# Patient Record
Sex: Female | Born: 1945 | Race: Black or African American | Hispanic: No | Smoking: Former smoker
Health system: Southern US, Community
[De-identification: ages and names within clinical notes are randomized; demographics above are authoritative.]

## PROBLEM LIST (undated history)

## (undated) DIAGNOSIS — B353 Tinea pedis: Secondary | ICD-10-CM

## (undated) DIAGNOSIS — M1990 Unspecified osteoarthritis, unspecified site: Secondary | ICD-10-CM

## (undated) DIAGNOSIS — M109 Gout, unspecified: Secondary | ICD-10-CM

## (undated) DIAGNOSIS — R011 Cardiac murmur, unspecified: Secondary | ICD-10-CM

## (undated) DIAGNOSIS — I82409 Acute embolism and thrombosis of unspecified deep veins of unspecified lower extremity: Secondary | ICD-10-CM

## (undated) DIAGNOSIS — M5412 Radiculopathy, cervical region: Secondary | ICD-10-CM

## (undated) DIAGNOSIS — Z9289 Personal history of other medical treatment: Secondary | ICD-10-CM

## (undated) DIAGNOSIS — IMO0002 Reserved for concepts with insufficient information to code with codable children: Secondary | ICD-10-CM

## (undated) DIAGNOSIS — B009 Herpesviral infection, unspecified: Secondary | ICD-10-CM

## (undated) DIAGNOSIS — H269 Unspecified cataract: Secondary | ICD-10-CM

## (undated) DIAGNOSIS — M7550 Bursitis of unspecified shoulder: Secondary | ICD-10-CM

## (undated) DIAGNOSIS — E785 Hyperlipidemia, unspecified: Secondary | ICD-10-CM

## (undated) DIAGNOSIS — B351 Tinea unguium: Secondary | ICD-10-CM

## (undated) DIAGNOSIS — D689 Coagulation defect, unspecified: Secondary | ICD-10-CM

## (undated) DIAGNOSIS — M5416 Radiculopathy, lumbar region: Secondary | ICD-10-CM

## (undated) DIAGNOSIS — I2510 Atherosclerotic heart disease of native coronary artery without angina pectoris: Secondary | ICD-10-CM

## (undated) DIAGNOSIS — B019 Varicella without complication: Secondary | ICD-10-CM

## (undated) DIAGNOSIS — R0989 Other specified symptoms and signs involving the circulatory and respiratory systems: Secondary | ICD-10-CM

## (undated) DIAGNOSIS — U071 COVID-19: Secondary | ICD-10-CM

## (undated) DIAGNOSIS — M21371 Foot drop, right foot: Secondary | ICD-10-CM

## (undated) DIAGNOSIS — I10 Essential (primary) hypertension: Secondary | ICD-10-CM

## (undated) DIAGNOSIS — L304 Erythema intertrigo: Secondary | ICD-10-CM

## (undated) DIAGNOSIS — N183 Chronic kidney disease, stage 3 unspecified: Secondary | ICD-10-CM

## (undated) DIAGNOSIS — K219 Gastro-esophageal reflux disease without esophagitis: Secondary | ICD-10-CM

## (undated) DIAGNOSIS — E559 Vitamin D deficiency, unspecified: Secondary | ICD-10-CM

## (undated) DIAGNOSIS — I739 Peripheral vascular disease, unspecified: Secondary | ICD-10-CM

## (undated) DIAGNOSIS — M2041 Other hammer toe(s) (acquired), right foot: Secondary | ICD-10-CM

## (undated) DIAGNOSIS — E119 Type 2 diabetes mellitus without complications: Secondary | ICD-10-CM

## (undated) DIAGNOSIS — G4700 Insomnia, unspecified: Secondary | ICD-10-CM

## (undated) DIAGNOSIS — K635 Polyp of colon: Secondary | ICD-10-CM

## (undated) DIAGNOSIS — G4733 Obstructive sleep apnea (adult) (pediatric): Secondary | ICD-10-CM

## (undated) DIAGNOSIS — Z9989 Dependence on other enabling machines and devices: Secondary | ICD-10-CM

## (undated) DIAGNOSIS — M2042 Other hammer toe(s) (acquired), left foot: Secondary | ICD-10-CM

## (undated) DIAGNOSIS — G5600 Carpal tunnel syndrome, unspecified upper limb: Secondary | ICD-10-CM

## (undated) HISTORY — DX: Atherosclerotic heart disease of native coronary artery without angina pectoris: I25.10

## (undated) HISTORY — DX: Varicella without complication: B01.9

## (undated) HISTORY — DX: Other specified symptoms and signs involving the circulatory and respiratory systems: R09.89

## (undated) HISTORY — DX: COVID-19: U07.1

## (undated) HISTORY — PX: COLON SURGERY: SHX602

## (undated) HISTORY — DX: Coagulation defect, unspecified: D68.9

## (undated) HISTORY — PX: VASCULAR SURGERY: SHX849

## (undated) HISTORY — PX: BACK SURGERY: SHX140

## (undated) HISTORY — PX: OTHER SURGICAL HISTORY: SHX169

## (undated) HISTORY — DX: Essential (primary) hypertension: I10

## (undated) HISTORY — DX: Unspecified osteoarthritis, unspecified site: M19.90

## (undated) HISTORY — DX: Chronic kidney disease, stage 3 unspecified: N18.30

## (undated) HISTORY — DX: Type 2 diabetes mellitus without complications: E11.9

## (undated) HISTORY — DX: Polyp of colon: K63.5

## (undated) HISTORY — DX: Tinea pedis: B35.3

## (undated) HISTORY — DX: Vitamin D deficiency, unspecified: E55.9

## (undated) HISTORY — DX: Insomnia, unspecified: G47.00

## (undated) HISTORY — DX: Unspecified cataract: H26.9

## (undated) HISTORY — DX: Other hammer toe(s) (acquired), right foot: M20.41

## (undated) HISTORY — DX: Acute embolism and thrombosis of unspecified deep veins of unspecified lower extremity: I82.409

## (undated) HISTORY — DX: Personal history of other medical treatment: Z92.89

## (undated) HISTORY — PX: UPPER GI ENDOSCOPY: SHX6162

## (undated) HISTORY — DX: Obstructive sleep apnea (adult) (pediatric): G47.33

## (undated) HISTORY — PX: COLONOSCOPY: SHX174

## (undated) HISTORY — DX: Hyperlipidemia, unspecified: E78.5

## (undated) HISTORY — DX: Peripheral vascular disease, unspecified: I73.9

## (undated) HISTORY — DX: Gastro-esophageal reflux disease without esophagitis: K21.9

## (undated) HISTORY — DX: Herpesviral infection, unspecified: B00.9

## (undated) HISTORY — DX: Cardiac murmur, unspecified: R01.1

## (undated) HISTORY — DX: Carpal tunnel syndrome, unspecified upper limb: G56.00

## (undated) HISTORY — DX: Erythema intertrigo: L30.4

## (undated) HISTORY — DX: Other hammer toe(s) (acquired), left foot: M20.42

## (undated) HISTORY — DX: Reserved for concepts with insufficient information to code with codable children: IMO0002

## (undated) HISTORY — DX: Radiculopathy, lumbar region: M54.16

## (undated) HISTORY — PX: TUBAL LIGATION: SHX77

## (undated) HISTORY — DX: Foot drop, right foot: M21.371

## (undated) HISTORY — DX: Bursitis of unspecified shoulder: M75.50

## (undated) HISTORY — DX: Radiculopathy, cervical region: M54.12

## (undated) HISTORY — DX: Obstructive sleep apnea (adult) (pediatric): Z99.89

## (undated) HISTORY — DX: Gout, unspecified: M10.9

## (undated) HISTORY — DX: Tinea unguium: B35.1

---

## 2005-04-05 ENCOUNTER — Ambulatory Visit: Payer: Self-pay | Admitting: Family Medicine

## 2007-07-05 ENCOUNTER — Ambulatory Visit: Payer: Self-pay | Admitting: Family Medicine

## 2008-11-20 ENCOUNTER — Ambulatory Visit: Payer: Self-pay | Admitting: Family Medicine

## 2008-12-04 ENCOUNTER — Ambulatory Visit: Payer: Self-pay | Admitting: Family Medicine

## 2009-07-14 ENCOUNTER — Ambulatory Visit: Payer: Self-pay | Admitting: Family Medicine

## 2009-10-10 ENCOUNTER — Emergency Department: Payer: Self-pay | Admitting: Emergency Medicine

## 2012-01-17 DIAGNOSIS — M48062 Spinal stenosis, lumbar region with neurogenic claudication: Secondary | ICD-10-CM | POA: Insufficient documentation

## 2012-01-17 HISTORY — DX: Spinal stenosis, lumbar region with neurogenic claudication: M48.062

## 2012-10-19 DIAGNOSIS — M5416 Radiculopathy, lumbar region: Secondary | ICD-10-CM

## 2012-10-19 HISTORY — DX: Radiculopathy, lumbar region: M54.16

## 2012-12-24 ENCOUNTER — Ambulatory Visit: Payer: Self-pay | Admitting: Family Medicine

## 2013-04-16 DIAGNOSIS — I10 Essential (primary) hypertension: Secondary | ICD-10-CM

## 2013-04-16 DIAGNOSIS — E1151 Type 2 diabetes mellitus with diabetic peripheral angiopathy without gangrene: Secondary | ICD-10-CM | POA: Insufficient documentation

## 2013-04-16 DIAGNOSIS — Z0000 Encounter for general adult medical examination without abnormal findings: Secondary | ICD-10-CM | POA: Insufficient documentation

## 2013-04-16 DIAGNOSIS — E119 Type 2 diabetes mellitus without complications: Secondary | ICD-10-CM

## 2013-04-16 HISTORY — DX: Type 2 diabetes mellitus without complications: E11.9

## 2013-04-16 HISTORY — DX: Encounter for general adult medical examination without abnormal findings: Z00.00

## 2013-04-16 HISTORY — DX: Essential (primary) hypertension: I10

## 2013-05-10 ENCOUNTER — Emergency Department: Payer: Self-pay | Admitting: Emergency Medicine

## 2013-05-10 LAB — COMPREHENSIVE METABOLIC PANEL
Anion Gap: 4 — ABNORMAL LOW (ref 7–16)
BUN: 16 mg/dL (ref 7–18)
Bilirubin,Total: 0.5 mg/dL (ref 0.2–1.0)
Calcium, Total: 9.7 mg/dL (ref 8.5–10.1)
Creatinine: 1.06 mg/dL (ref 0.60–1.30)
Glucose: 139 mg/dL — ABNORMAL HIGH (ref 65–99)
Potassium: 4 mmol/L (ref 3.5–5.1)

## 2013-05-10 LAB — CBC
HCT: 39.5 % (ref 35.0–47.0)
HGB: 13.3 g/dL (ref 12.0–16.0)
MCH: 29.8 pg (ref 26.0–34.0)
RBC: 4.46 10*6/uL (ref 3.80–5.20)
RDW: 14.2 % (ref 11.5–14.5)
WBC: 10.1 10*3/uL (ref 3.6–11.0)

## 2013-05-10 LAB — URINALYSIS, COMPLETE
Bacteria: NONE SEEN
Bilirubin,UR: NEGATIVE
Glucose,UR: NEGATIVE mg/dL (ref 0–75)
Ketone: NEGATIVE
Leukocyte Esterase: NEGATIVE
Nitrite: NEGATIVE
Ph: 8 (ref 4.5–8.0)
Protein: NEGATIVE
Specific Gravity: 1.011 (ref 1.003–1.030)

## 2013-05-10 LAB — APTT: Activated PTT: 29.5 secs (ref 23.6–35.9)

## 2013-05-10 LAB — PROTIME-INR
INR: 1.1
Prothrombin Time: 14.2 secs (ref 11.5–14.7)

## 2013-05-20 ENCOUNTER — Encounter: Payer: Self-pay | Admitting: Podiatry

## 2013-05-20 ENCOUNTER — Ambulatory Visit (INDEPENDENT_AMBULATORY_CARE_PROVIDER_SITE_OTHER): Payer: Medicare (Managed Care) | Admitting: Podiatry

## 2013-05-20 VITALS — BP 111/66 | HR 91 | Resp 16 | Ht 61.0 in | Wt 154.0 lb

## 2013-05-20 DIAGNOSIS — B351 Tinea unguium: Secondary | ICD-10-CM

## 2013-05-20 DIAGNOSIS — M79609 Pain in unspecified limb: Secondary | ICD-10-CM

## 2013-05-20 NOTE — Progress Notes (Signed)
Natalie Watts presents today with a chief complaint of painful toenails one through 5 bilateral.  Objective: Pulses are palpable today. Toenails are thick yellow dystrophic clinically mycotic and painful palpation as well as debridement.  Assessment: Pain in limb secondary to onychomycosis 1 through 5 bilateral.  Plan: Debridement of nails 1 through 5 bilateral covered service secondary to pain followup with her in 3 months to

## 2013-07-18 ENCOUNTER — Ambulatory Visit: Payer: Self-pay | Admitting: Oncology

## 2013-07-25 ENCOUNTER — Ambulatory Visit: Payer: Self-pay | Admitting: Oncology

## 2013-08-18 ENCOUNTER — Ambulatory Visit: Payer: Self-pay | Admitting: Oncology

## 2013-08-19 ENCOUNTER — Ambulatory Visit: Payer: Medicare (Managed Care) | Admitting: Podiatry

## 2013-09-30 ENCOUNTER — Ambulatory Visit: Payer: Medicare (Managed Care) | Admitting: Podiatry

## 2013-12-30 ENCOUNTER — Ambulatory Visit (INDEPENDENT_AMBULATORY_CARE_PROVIDER_SITE_OTHER): Payer: 59 | Admitting: Podiatry

## 2013-12-30 ENCOUNTER — Encounter: Payer: Self-pay | Admitting: Podiatry

## 2013-12-30 DIAGNOSIS — B351 Tinea unguium: Secondary | ICD-10-CM

## 2013-12-30 DIAGNOSIS — M79609 Pain in unspecified limb: Secondary | ICD-10-CM

## 2013-12-30 DIAGNOSIS — M79676 Pain in unspecified toe(s): Secondary | ICD-10-CM

## 2013-12-30 NOTE — Progress Notes (Signed)
She presents today with a chief complaint of painful elongated toenails.  Objective: Thick yellow dystrophic clinically mycotic and painful palpation.  Assessment: Pain in limb secondary to onychomycosis 1 through 5 bilateral.  Plan: Debridement of nails thickness and length as cover service secondary to pain.

## 2014-04-03 ENCOUNTER — Ambulatory Visit: Payer: Self-pay | Admitting: Family Medicine

## 2014-04-07 ENCOUNTER — Ambulatory Visit: Payer: 59 | Admitting: Podiatry

## 2014-04-08 ENCOUNTER — Ambulatory Visit (INDEPENDENT_AMBULATORY_CARE_PROVIDER_SITE_OTHER): Payer: 59 | Admitting: Podiatry

## 2014-04-08 DIAGNOSIS — M79676 Pain in unspecified toe(s): Secondary | ICD-10-CM

## 2014-04-08 DIAGNOSIS — B351 Tinea unguium: Secondary | ICD-10-CM

## 2014-04-08 NOTE — Progress Notes (Signed)
Subjective:     Patient ID: Natalie Watts, female   DOB: [birth year], 68 y.o.   MRN: [id number]  HPI patient presents with painful nailbeds 1-5 on both feet that she cannot cut   Review of Systems     Objective:   Physical Exam Neurovascular status intact with thick yellow brittle nailbeds 1-5 both feet that are painful    Assessment:     Mycotic nail infection with pain 1-5 both feet    Plan:     Debridement painful nailbeds 1-5 both feet with no iatrogenic bleeding noted

## 2014-05-22 ENCOUNTER — Ambulatory Visit: Payer: Self-pay | Admitting: Family Medicine

## 2014-06-05 DIAGNOSIS — M4722 Other spondylosis with radiculopathy, cervical region: Secondary | ICD-10-CM | POA: Insufficient documentation

## 2014-06-05 DIAGNOSIS — M1812 Unilateral primary osteoarthritis of first carpometacarpal joint, left hand: Secondary | ICD-10-CM

## 2014-06-05 DIAGNOSIS — M47812 Spondylosis without myelopathy or radiculopathy, cervical region: Secondary | ICD-10-CM

## 2014-06-05 HISTORY — DX: Spondylosis without myelopathy or radiculopathy, cervical region: M47.812

## 2014-06-05 HISTORY — DX: Unilateral primary osteoarthritis of first carpometacarpal joint, left hand: M18.12

## 2014-07-10 ENCOUNTER — Ambulatory Visit: Payer: 59 | Admitting: Podiatry

## 2014-07-10 ENCOUNTER — Ambulatory Visit (INDEPENDENT_AMBULATORY_CARE_PROVIDER_SITE_OTHER): Payer: Medicare Other | Admitting: Podiatry

## 2014-07-10 DIAGNOSIS — M79676 Pain in unspecified toe(s): Secondary | ICD-10-CM

## 2014-07-10 DIAGNOSIS — B351 Tinea unguium: Secondary | ICD-10-CM

## 2014-07-13 NOTE — Progress Notes (Signed)
Patient ID: Natalie Watts, female   DOB: [birth year], 69 y.o.   MRN: [id number]  Subjective: 69 year old female with turns to the office today with painful, elongated toenails on both of her feet. She states that she is unable to cut them herself and they have been rubbing in her shoes causing discomfort. She denies any recent redness or drainage from around the nail sites. She states that her blood sugars been running between 114-130. No other complaints at this time. Denies any systemic complaints as fevers, chills, nausea, vomiting.  Objective: AAO 3, NAD DP/PT pulses palpable bilaterally, CRT less than 3 seconds Protective sensation appears to be intact with Derrel Nip monofilament, Achilles tendon reflex intact. Nails hypertrophic, dystrophic, elongated, brittle, discolored 10. No surrounding erythema or drainage from the nail sites. No open lesions or pre-ulcerative lesions. No interdigital maceration is present. No other areas of tenderness to bilateral lower extremities. No overlying edema, erythema, increase in warmth bilaterally. No pain with calf compression, swelling, warmth, erythema.  Assessment: 69 year old female with symptomatic onychomycosis  Plan: -Treatment options discussed including alternatives, risks, complications. -Nail sharply debrided 10 without complication/bleeding. -Discussed the importance of daily foot inspection. -Follow-up in 3 months or sooner should any problems arise. In the meantime, encouraged to call the office with any questions, concerns, change in symptoms.

## 2014-08-31 ENCOUNTER — Observation Stay: Payer: Self-pay | Admitting: Internal Medicine

## 2014-09-12 ENCOUNTER — Emergency Department: Payer: Self-pay | Admitting: Emergency Medicine

## 2014-09-12 LAB — LIPASE, BLOOD: LIPASE: 29 U/L

## 2014-09-12 LAB — URINALYSIS, COMPLETE
Bacteria: NONE SEEN
Bilirubin,UR: NEGATIVE
Leukocyte Esterase: NEGATIVE
NITRITE: NEGATIVE
Ph: 7 (ref 4.5–8.0)
Protein: 100
RBC,UR: 1 /HPF (ref 0–5)
SPECIFIC GRAVITY: 1.008 (ref 1.003–1.030)

## 2014-09-12 LAB — CBC
HCT: 44.3 % (ref 35.0–47.0)
HGB: 14.3 g/dL (ref 12.0–16.0)
MCH: 27.9 pg (ref 26.0–34.0)
MCHC: 32.3 g/dL (ref 32.0–36.0)
MCV: 86 fL (ref 80–100)
Platelet: 187 10*3/uL (ref 150–440)
RBC: 5.13 10*6/uL (ref 3.80–5.20)
RDW: 15.7 % — AB (ref 11.5–14.5)
WBC: 14.4 10*3/uL — AB (ref 3.6–11.0)

## 2014-09-12 LAB — COMPREHENSIVE METABOLIC PANEL
ALBUMIN: 4.5 g/dL
ALT: 19 U/L
AST: 21 U/L
Alkaline Phosphatase: 106 U/L
Anion Gap: 11 (ref 7–16)
BUN: 21 mg/dL — ABNORMAL HIGH
Bilirubin,Total: 0.5 mg/dL
Calcium, Total: 10.1 mg/dL
Chloride: 100 mmol/L — ABNORMAL LOW
Co2: 27 mmol/L
Creatinine: 1.12 mg/dL — ABNORMAL HIGH
EGFR (African American): 58 — ABNORMAL LOW
EGFR (Non-African Amer.): 50 — ABNORMAL LOW
Glucose: 262 mg/dL — ABNORMAL HIGH
POTASSIUM: 4.7 mmol/L
Sodium: 138 mmol/L
Total Protein: 9 g/dL — ABNORMAL HIGH

## 2014-10-17 ENCOUNTER — Ambulatory Visit: Admit: 2014-10-17 | Disposition: A | Discharge status: Home / Self Care | Payer: Self-pay

## 2014-10-19 NOTE — Consult Note (Signed)
PATIENT NAME:  Natalie Watts, Natalie Watts MR#:  [id number] DATE OF BIRTH:  [birth year]  DATE OF CONSULTATION:  08/31/2014  REFERRING PHYSICIAN:   CONSULTING PHYSICIAN:  Huey Romans, MD   REASON FOR CONSULTATION: Evaluate the larynx because of acute angioedema.   HISTORY OF PRESENT ILLNESS: The patient is a 69 year old African American female who awoke this morning with her tongue swelling. She came into the Emergency Room and has had continued swelling underneath the tongue where it is difficult to talk, and she is unable to swallow. She is having a hard time speaking as well. She started taking some lisinopril 2 days ago and had a dose in the evening last night and then woke this morning with swelling. She had some lip swelling with a blood pressure medication in the past but does not remember the name of it. Assessments were made today to see if there is any evidence of laryngeal swelling and if we need to proceed to intubation.   PAST MEDICAL HISTORY: Significant for hypertension.  REVIEW OF SYSTEMS: She is not having any shortness of breath at this time. She denies any lung issues. She has degenerative disk disease in her neck. She is not having any headache,  fever or signs of infection.   CURRENT MEDICATIONS: As noted in the chart.   DRUG ALLERGIES: TRAMADOL AND NOW LISINOPRIL.   SOCIAL HISTORY: She does not smoke or drink alcohol.   PHYSICAL EXAMINATION:  GENERAL: The patient is alert and very cooperative. Her tongue tip is sticking out of the mouth a little bit. She has watery edema of the floor of the mouth on both sides. Her face is not swollen although it almost looks like there is a little swelling in the left upper eyelid.  NECK: Soft. There is no swelling felt in the submental area or lower in the neck. Her speech is affected a little bit from the large tongue, but she is breathing okay on room air.   Flexible laryngoscopy was done, and this is dictated in detail elsewhere. I passed  through her right nostril which is clear. The nasopharynx is clear. The hypopharynx and larynx show no sign of swelling or edema around the laryngeal inlet at all. The tongue base does not appear to be swollen either.   IMPRESSION: The patient has angioedema. It has been caught fairly early. We will watch her for about 45 minutes. She has been given medication of Solu-Medrol, Benadryl IV, Pepcid and Epi-Pen. She does not appear to be having any more swelling right now and has stabilized. She does not have any laryngeal involvement. I will look one more time to make sure that her larynx has not started to swell. She will be admitted by the hospitalist service to evaluate and watch, but I do not think she needs intubation currently as there is no active swelling in the larynx. It seems like the swelling has stopped. I will be available if there are any further signs of swelling to re-evaluate if necessary.    ____________________________ Huey Romans, MD phj:JT D: 08/31/2014 08:27:07 ET T: 08/31/2014 14:03:35 ET JOB#: [id number]  cc: Huey Romans, MD, <Dictator> Huey Romans MD ELECTRONICALLY SIGNED 08/31/2014 22:01

## 2014-10-19 NOTE — Discharge Summary (Signed)
PATIENT NAME:  Natalie Watts, Natalie Watts MR#:  [id number] DATE OF BIRTH:  1946/04/15  DATE OF ADMISSION:  08/31/2014 DATE OF DISCHARGE:  09/01/2014  ADMITTING PHYSICIAN:  Gladstone Lighter, MD.    PRIMARY CARE PHYSICIAN:  [facility] at [facility].    CONSULTATIONS IN THE HOSPITAL: ENT consultation by Dr. Kathyrn Sheriff.     DISCHARGE DIAGNOSES:  1. 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  2. Hypertension.  3. Diabetes mellitus.  4. Neuropathy.  5. Left wrist tendinitis.  6. Hyperlipidemia.  7. Osteoarthritis.   8. Degenerative disk disease.  9. Peripheral vascular disease pronounced on her left side.  10. History of deep vein thrombosis in the past, off of Xarelto now.    DISCHARGE HOME MEDICATIONS:  1. Magnesium oxide 400 mg p.o. daily.  2. Norvasc 5 mg p.o. daily.  3. Glimepiride 2 mg p.o. daily.  4. Pravachol 40 mg p.o. at bedtime.  5. Gabapentin 100 mg p.o. at bedtime.  6. Calcium/vitamin D 600 mg/800 international unit daily.  7. Vitamin B12, 500 mcg p.o. every other day.  8. Prednisone taper over 5 days.  9. EpiPen as needed for allergies.   DISCHARGE OXYGEN: None.   DISCHARGE DIET: Low-sodium, ADA, 1800 calorie diet.   DISCHARGE ACTIVITY: As tolerated.    FOLLOWUP INSTRUCTIONS:  1.  PCP followup in 1 week.  2.  Avoid lisinopril and other ACE inhibitors.    LABORATORIES AND IMAGING STUDIES PRIOR TO DISCHARGE:  1.  WBC 9.3, hemoglobin 11.6, hematocrit 36.5, platelet count 198.000.  2.  Sodium 140, potassium 3.9, chloride 106, bicarbonate 23, BUN 19, creatinine 0.87, glucose 182, and calcium of 9.2.   BRIEF HOSPITAL COURSE: [name] is a 69 year old African-American female with past medical history significant for hypertension, diabetes, arthritis, neuropathy, and tendinitis, who presents to the hospital secondary to worsening tongue swelling.   1.  Angioedema. Her PCP just started her on lisinopril 2 days prior to this presentation and the patient had taken 1 dose. She  wakes up with significant tongue swelling, unable to breathe, and also lip swelling. Seen by ENT, flexible laryngoscopy done in the ER did not have any airway obstruction, admitted under observation.  IV steroids were given and she is on Benadryl and Pepcid with significant improvement in swelling within 24 hours. The patient to was able to tolerate diet.  ENT further followup did not recommend any outpatient allergy studies because it was straightforward and from lisinopril. All her other medications are being continued and she was advised to stay away from lisinopril and other ACE inhibitors. She is being discharged on prednisone taper and EpiPen.  2.  Hypertension. She is on Norvasc.  3.  Diabetes, on glipizide.  4.  Neuropathy and arthritis. Follows with rheumatologist, on gabapentin.   Her course has been otherwise uneventful in the hospital.   DISCHARGE CONDITION: Stable.   DISCHARGE DISPOSITION: Home.   TIME SPENT ON DISCHARGE: 40 minutes.     ____________________________ Gladstone Lighter, MD rk:bu D: 09/01/2014 13:20:23 ET T: 09/01/2014 14:49:06 ET JOB#: [id number]  cc: Gladstone Lighter, MD, <Dictator> Dr. Gala Murdoch Gladstone Lighter MD ELECTRONICALLY SIGNED 09/04/2014 18:27

## 2014-10-19 NOTE — Op Note (Signed)
PATIENT NAME:  Natalie Watts, Natalie Watts MR#:  [id number] DATE OF BIRTH:  05/30/1946  DATE OF PROCEDURE:  08/31/2014  PREOPERATIVE DIAGNOSIS: Angioedema.   POSTOPERATIVE DIAGNOSIS: Angioedema.   OPERATIVE PROCEDURE: Flexible laryngoscopy.   ANESTHESIA: None.   DESCRIPTION OF PROCEDURE: The patient was seen in the Emergency Room with acute swelling of the tongue and floor of the mouth, swelling up enough to occlude much of the oral airway. Her mouth was open to try to breathe around this. Flexible laryngoscopy was done to assess her larynx.   The scope was lubricated and then passed very gently through the right nostril to visualize the hypopharynx and larynx. The nose was clear. No sign of swelling or inflammation. The nasopharynx was normal. The hypopharynx showed no significant swelling of the tongue base. The epiglottis was not swollen at all, and neither were the aryepiglottic folds. Vocal cords were pearly white and moved well. The airway was totally clear. There was no swelling around the laryngeal inlet at all.   The patient tolerated the procedure well. This was done at the bedside in the ER. There were no operative complications.   ____________________________ Huey Romans, MD phj:JT D: 08/31/2014 08:21:37 ET T: 08/31/2014 15:35:26 ET JOB#: [id number]  cc: Huey Romans, MD, <Dictator> Huey Romans MD ELECTRONICALLY SIGNED 08/31/2014 22:02

## 2014-10-19 NOTE — H&P (Signed)
PATIENT NAME:  Natalie Watts, Natalie Watts MR#:  [id number] DATE OF BIRTH:  August 11, 1945  DATE OF ADMISSION:  08/31/2014  ADMITTING PHYSICIAN: Gladstone Lighter, MD.   PRIMARY CARE PHYSICIAN: Criss Rosales, MD, at [facility].  CHIEF COMPLAINT: Tongue swelling.   HISTORY OF PRESENT ILLNESS: Ms Phung is a pleasant, 69 year old, African-American female with past medical history significant for hypertension, non-insulin-dependent diabetes mellitus, significant osteoarthritis and degenerative disk disease, history of left leg DVT in the past off of Xarelto now, peripheral vascular disease and tendonitis, presents to the hospital secondary to worsening of tongue swelling that just started earlier this morning. The patient was seen by her PCP 2 days ago on 08/28/2014. Her medications were adjusted. She was started on lisinopril 10 mg p.o. daily to help with her blood pressure and Neurontin low-dose at bedtime and statin for her cholesterol. The patient said she took her medications on 08/29/2014 and was feeling fine. This morning when she woke up she felt that her mouth was swollen, her tongue was extremely swollen and said that she could not swallow anything. She does not have a stridor. She feels that her airway is fine. She has been on a blood pressure medication about 25 years ago, at which time she had some lip swelling but she was not told what it was and not sure if she has been allergic to ACE inhibitors in the past.   PAST MEDICAL HISTORY: 1. Hypertension.  2. Non-insulin-dependent diabetes mellitus.  3. 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  4. Left arm tendinitis at the wrist.  5. Hyperlipidemia.  6. Severe osteoarthritis.  7. Degenerative disk disease.  8. Peripheral vascular disease, pronounced on her left side.   PAST SURGICAL HISTORY: 1. Left ovarian twist surgery.  2. Tubal ligation.   ALLERGIES TO MEDICATIONS: TRAMADOL, PENICILLIN AND LISINOPRIL.   CURRENT HOME MEDICATIONS:   1. Amlodipine 5 mg p.o. daily.  2. Calcium with vitamin D 600 mg with 800 international units 1 tablet p.o. daily.  3. Gabapentin 100 mg p.o. at bedtime.  4. Glimepiride 2 mg p.o. daily.  5. Magnesium oxide 400 mg p.o. daily.  6. Pravastatin 40 mg p.o. at bedtime.  7. Vitamin B12 at 500 mcg p.o. every other day.  8. Lisinopril 10 mg p.o. daily.   SOCIAL HISTORY: Lives at home by herself. Works at a part-time job. Prior history of smoking, but completely quit 40 years ago. No alcohol use.   FAMILY HISTORY: Father with colon cancer and paternal grandmother with breast cancer.   REVIEW OF SYSTEMS:  CONSTITUTIONAL: No fever, fatigue or weakness. EYES: No blurred vision, double vision, pain, inflammation or glaucoma.  ENT: No tinnitus, ear pain, hearing loss, epistaxis or discharge.  RESPIRATORY: No cough, wheeze, hemoptysis or COPD.  CARDIOVASCULAR: No chest pain, orthopnea, edema, arrhythmia, palpitations or syncope. GASTROINTESTINAL: No nausea, vomiting, diarrhea, abdominal pain, hematemesis, or melena. GENITOURINARY: No dysuria, nocturia, renal calculus, frequency or incontinence.  ENDOCRINE: No polyuria, nocturia, thyroid problems, heat or cold intolerance.  HEMATOLOGY: No anemia, easy bruising or bleeding.  SKIN: No acne, rash or lesions.  MUSCULOSKELETAL: Positive for multiple joint pains, arthritis.  NEUROLOGIC: No numbness, weakness, CVA, TIA or seizures.  PSYCHIATRIC: No anxiety, insomnia or depression.   PHYSICAL EXAMINATION: VITAL SIGNS: Temperature afebrile, pulse 84, respirations 18, blood pressure 160/95, pulse oximetry 97% on room air.  GENERAL: Well-built, well-nourished female lying in bed, not in any acute distress.  HEENT: Normocephalic, atraumatic. Pupils are equal, round, reacting to light. Anicteric  sclerae. Extraocular movements intact. Oropharynx with some left lip swelling. There is significant swelling of the tongue, occupying the whole oral cavity. No stridor  noted. She did have a flexible laryngoscopy examination done by ENT that showed no airway compromise.  NECK: Supple. No thyromegaly, JVD or carotid bruits. No lymphadenopathy.  LUNGS: Moving air bilaterally. No wheeze or crackles. No use of accessory muscles for breathing.  CARDIOVASCULAR: S1, S2. Regular rate and rhythm. No murmurs, rubs or gallops.  ABDOMEN: Obese, soft, nontender, nondistended. No hepatosplenomegaly. Normal bowel sounds.  EXTREMITIES: No pedal edema. No clubbing or cyanosis. Dorsalis pedis pulses palpable bilaterally 2+.  SKIN: No acne, rash or lesions.  ENDOCRINE: No cervical lymphadenopathy.  NEUROLOGIC: Cranial nerves intact. Slight grip weakness of the left hand, which is chronic.  No sensation abnormalities.  PSYCHIATRIC: The patient is awake, alert, oriented x 3.   LABORATORY DATA: WBC 9.0, hemoglobin 12.5, hematocrit 40.0, platelet count 198,000. Sodium 140, potassium 4.0, chloride 108, bicarbonate 23, BUN 21, creatinine 1.05. Glucose 131 and calcium of 9.0.   ASSESSMENT AND PLAN: This is 69 year old female with a history of hypertension, diabetes, tendonitis, severe degenerative disk disease, neuropathy, history of deep vein thrombosis, admitted for angioedema. 1. Angioedema secondary to lisinopril that was started 2 days ago by primary care physician. Discontinue angiotensin converting enzyme inhibitor. Appreciate ENT input. No airway compromise. We will still have to monitor the patient in critical care unit because of her significant tongue swelling. Only liquids by mouth today. The patient and her family refused fresh frozen plasma. She will continue Pepcid and Solu-Medrol for now. 2. Hypertension. Discontinue lisinopril. Continue Norvasc.  3. Diabetes mellitus on glipizide and sliding scale.  4. Neuropathy. Continue gabapentin at bedtime.  5. History of deep vein thrombosis in 2014. Finished her Xarelto. Followed at cancer center in the past. Off of anticoagulation  at this time as per recommendations. Deep vein thrombosis prophylaxis with subcutaneous heparin.   CODE STATUS: Full code.   TIME SPENT: On admission is 50 minutes.     ____________________________ Gladstone Lighter, MD rk:TT D: 08/31/2014 09:35:00 ET T: 08/31/2014 11:57:03 ET JOB#: [id number]  cc: Dr. Criss Rosales, [facility] Gladstone Lighter, MD, <Dictator>   Gladstone Lighter MD ELECTRONICALLY SIGNED 09/04/2014 18:27

## 2014-11-07 ENCOUNTER — Encounter: Payer: Medicare Other | Attending: Family Medicine | Admitting: Dietician

## 2014-11-07 VITALS — BP 120/70 | Ht 62.0 in | Wt 164.3 lb

## 2014-11-07 DIAGNOSIS — N183 Chronic kidney disease, stage 3 unspecified: Secondary | ICD-10-CM | POA: Insufficient documentation

## 2014-11-07 DIAGNOSIS — N1832 Chronic kidney disease, stage 3b: Secondary | ICD-10-CM | POA: Insufficient documentation

## 2014-11-07 DIAGNOSIS — E119 Type 2 diabetes mellitus without complications: Secondary | ICD-10-CM | POA: Insufficient documentation

## 2014-11-07 NOTE — Patient Instructions (Signed)
Ask for Splenda when you get a smoothie, and sip on half, refrigerate the rest for later.  If you eat oatmeal, use the plain (unflavored) kind and add Splenda. If you eat the flavored kind, avoid adding fruit. Do add an egg or some nuts for protein. Try Minute Rice, multi-grain blend, cook in low-sodium broth and add Mrs. Dash or other herbs like basil, oregano to season. Try Healthy Balance juice, Crystal Light, or tea with Splenda for alternatives to water.

## 2014-11-07 NOTE — Progress Notes (Signed)
Patient has made positive dietary changes since prior visit with RN. Some breakfast meals lack protein, advised on options for including a protein source.  She reports enjoying a smoothie from [employer] occasionally -- in checking nutrition information, informed pt that her smoothie of choice has 592 calories and 87g sugar. Advised her to ask for Splenda rather than sugar in the drink, and to sip half of the 24oz portion, saving the rest in the freezer for another time.   Instructed pt on basic meal planning using plate method and her food diary. Provided menu ideas for quick and balanced meals.   Encouraged pt to call as needed with any questions or concerns.

## 2014-11-30 DIAGNOSIS — Z1211 Encounter for screening for malignant neoplasm of colon: Secondary | ICD-10-CM

## 2014-11-30 DIAGNOSIS — K219 Gastro-esophageal reflux disease without esophagitis: Secondary | ICD-10-CM | POA: Insufficient documentation

## 2014-11-30 DIAGNOSIS — Z1212 Encounter for screening for malignant neoplasm of rectum: Secondary | ICD-10-CM

## 2014-11-30 HISTORY — DX: Encounter for screening for malignant neoplasm of colon: Z12.11

## 2014-12-25 DIAGNOSIS — K635 Polyp of colon: Secondary | ICD-10-CM

## 2014-12-25 HISTORY — DX: Polyp of colon: K63.5

## 2014-12-26 DIAGNOSIS — C189 Malignant neoplasm of colon, unspecified: Secondary | ICD-10-CM

## 2014-12-26 HISTORY — DX: Malignant neoplasm of colon, unspecified: C18.9

## 2015-01-26 ENCOUNTER — Ambulatory Visit (INDEPENDENT_AMBULATORY_CARE_PROVIDER_SITE_OTHER): Payer: Medicare Other | Admitting: Podiatry

## 2015-01-26 DIAGNOSIS — B351 Tinea unguium: Secondary | ICD-10-CM | POA: Diagnosis not present

## 2015-01-26 DIAGNOSIS — M79676 Pain in unspecified toe(s): Secondary | ICD-10-CM

## 2015-01-26 NOTE — Progress Notes (Signed)
She presents today with a chief complaint of painful elongated toenails.  Objective: Thick yellow dystrophic clinically mycotic and painful palpation.  Assessment: Pain in limb secondary to onychomycosis 1 through 5 bilateral.  Plan: Debridement of nails thickness and length as cover service secondary to pain. 3 mo.

## 2015-04-05 ENCOUNTER — Ambulatory Visit
Admission: EM | Admit: 2015-04-05 | Discharge: 2015-04-05 | Disposition: A | Discharge status: Home / Self Care | Payer: Medicare Other | Attending: Family Medicine | Admitting: Family Medicine

## 2015-04-05 ENCOUNTER — Encounter: Payer: Self-pay | Admitting: *Deleted

## 2015-04-05 DIAGNOSIS — S8392XA Sprain of unspecified site of left knee, initial encounter: Secondary | ICD-10-CM | POA: Diagnosis not present

## 2015-04-05 NOTE — ED Provider Notes (Signed)
CSN: [id number]     Arrival date & time 04/05/15  1200 History   First MD Initiated Contact with Patient 04/05/15 1332     Chief Complaint  Patient presents with  . Knee Pain   (Consider location/radiation/quality/duration/timing/severity/associated sxs/prior Treatment) HPI Comments: 69 yo female presents with a complaint of left knee pain since last night. States was bending to sit on the toilet when she "felt a pop" and pain to her left knee. Today noticed some slight swelling and continued pain. Denies any falls, direct trauma or injuries. No fevers, chills.   The history is provided by the patient.    Past Medical History  Diagnosis Date  . Degenerative disk disease   . Blood clotting disorder ([place])   . Diabetes [facility])    Past Surgical History  Procedure Laterality Date  . Colon surgery    . Back surgery     Family History  Problem Relation Age of Onset  . Hypertension Mother   . Cancer Father   . Hypertension Sister   . Diabetes Sister    Social History  Substance Use Topics  . Smoking status: Former Smoker    Types: Cigarettes    Quit date: 12/18/2012  . Smokeless tobacco: Never Used  . Alcohol Use: No   OB History    No data available     Review of Systems  Allergies  Lisinopril and Tramadol  Home Medications   Prior to Admission medications   Medication Sig Start Date End Date Taking? Authorizing Provider  methocarbamol (ROBAXIN) 750 MG tablet Take 750 mg by mouth 4 (four) times daily.   Yes Historical Provider, MD  oxycodone-acetaminophen (LYNOX) 5-300 MG tablet Take 1 tablet by mouth every 4 (four) hours as needed for pain.   Yes Historical Provider, MD  acetaminophen (TYLENOL) 650 MG CR tablet Take 650 mg by mouth.    Historical Provider, MD  amLODipine (NORVASC) 5 MG tablet  08/28/14   Historical Provider, MD  Calcium Carbonate-Vitamin D 600-400 MG-UNIT per tablet Take by mouth.    Historical Provider, MD  cetirizine (ZYRTEC) 5 MG tablet Take 10 mg  by mouth.    Historical Provider, MD  clotrimazole-betamethasone (LOTRISONE) cream  12/02/13   Historical Provider, MD  Cyanocobalamin (VITAMIN B-12 CR) 1000 MCG TBCR Take by mouth. 01/17/12   Historical Provider, MD  diclofenac sodium (VOLTAREN) 1 % GEL  09/01/14   Historical Provider, MD  EPIPEN 2-PAK 0.3 MG/0.3ML SOAJ injection  09/01/14   Historical Provider, MD  gabapentin (NEURONTIN) 100 MG capsule Take 100 mg by mouth. 08/29/14   Historical Provider, MD  glimepiride (AMARYL) 2 MG tablet Take 2 mg by mouth. 08/28/14   Historical Provider, MD  hydrocortisone 2.5 % cream Apply topically.    Historical Provider, MD  magnesium oxide (MAG-OX) 400 MG tablet Take 400 mg by mouth. 01/17/12   Historical Provider, MD  metFORMIN (GLUCOPHAGE) 500 MG tablet  11/18/13   Historical Provider, MD  pravastatin (PRAVACHOL) 40 MG tablet Take 40 mg by mouth. 08/28/14   Historical Provider, MD  Damar TEST test strip  12/02/13   Historical Provider, MD  valACYclovir (VALTREX) 500 MG tablet  10/09/14   Historical Provider, MD   Meds Ordered and Administered this Visit  Medications - No data to display  BP 139/66 mmHg  Pulse 87  Temp(Src) 99.1 F (37.3 C) (Oral)  Resp 20  Ht 5\' 3"  (1.6 m)  Wt 158 lb (71.668 kg)  BMI 28.00 kg/m2  SpO2 99% No data found.   Physical Exam  Constitutional: She appears well-developed and well-nourished. No distress.  Musculoskeletal:       Left knee: She exhibits swelling (mild). She exhibits no effusion, no ecchymosis, no deformity, no laceration, no erythema, normal alignment, no LCL laxity, normal patellar mobility, no bony tenderness, normal meniscus and no MCL laxity. Tenderness found. Medial joint line and lateral joint line tenderness noted.  Skin: She is not diaphoretic.  Nursing note and vitals reviewed.   ED Course  Procedures (including critical care time)  Labs Review Labs Reviewed - No data to display  Imaging Review No results found.   Visual Acuity  Review  Right Eye Distance:   Left Eye Distance:   Bilateral Distance:    Right Eye Near:   Left Eye Near:    Bilateral Near:         MDM   1. Knee sprain, left, initial encounter    1.  diagnosis reviewed with patient/family 2. Recommend supportive treatment with analgesics, rest, ice, elevation 3. Follow prn if symptoms worsen or don't improve    Norval Gable, MD 04/05/15 [phone]

## 2015-04-05 NOTE — ED Notes (Signed)
Cervical surgery, C3-7 fusion in September 2016.  Colon resection 12/2014.  Patient stated she was sitting on the toilet yesterday evening  when she felt a pop in her left knee.  Pain increases with walking and certain way she bends it.  Ambulated to room 4 with a cane.  Pulses in lower extremites 2t, able to flex and wiggle toes.

## 2015-04-29 ENCOUNTER — Ambulatory Visit: Payer: Medicare Other

## 2015-04-29 ENCOUNTER — Encounter: Payer: Self-pay | Admitting: Podiatry

## 2015-04-29 ENCOUNTER — Encounter (INDEPENDENT_AMBULATORY_CARE_PROVIDER_SITE_OTHER): Payer: Medicare Other | Admitting: Podiatry

## 2015-04-29 DIAGNOSIS — B351 Tinea unguium: Secondary | ICD-10-CM

## 2015-04-29 DIAGNOSIS — M79676 Pain in unspecified toe(s): Secondary | ICD-10-CM

## 2015-06-02 NOTE — Progress Notes (Signed)
This encounter was created in error - please disregard.

## 2015-08-24 ENCOUNTER — Encounter: Payer: Self-pay | Admitting: Podiatry

## 2015-08-24 ENCOUNTER — Ambulatory Visit (INDEPENDENT_AMBULATORY_CARE_PROVIDER_SITE_OTHER): Payer: Medicare Other | Admitting: Podiatry

## 2015-08-24 DIAGNOSIS — M79676 Pain in unspecified toe(s): Secondary | ICD-10-CM

## 2015-08-24 DIAGNOSIS — B351 Tinea unguium: Secondary | ICD-10-CM | POA: Diagnosis not present

## 2015-08-24 NOTE — Progress Notes (Signed)
She presents today with a chief complaint of painful elongated toenails.  Objective: Vital signs are stable she is alert and oriented 3. Pulses are palpable. Toenails are thick yellow dystrophic onychomycotic.  Assessment: Pain limb secondary onychomycosis 1 through 5 bilateral.  Plan: Debridement of toenails 1 through 5 bilateral. He

## 2015-11-25 ENCOUNTER — Ambulatory Visit: Payer: Medicare Other | Admitting: Podiatry

## 2015-12-03 ENCOUNTER — Other Ambulatory Visit: Payer: Self-pay | Admitting: Family Medicine

## 2015-12-03 DIAGNOSIS — Z1231 Encounter for screening mammogram for malignant neoplasm of breast: Secondary | ICD-10-CM

## 2015-12-16 ENCOUNTER — Ambulatory Visit (INDEPENDENT_AMBULATORY_CARE_PROVIDER_SITE_OTHER): Payer: Medicare Other | Admitting: Podiatry

## 2015-12-16 ENCOUNTER — Encounter: Payer: Self-pay | Admitting: Podiatry

## 2015-12-16 DIAGNOSIS — B351 Tinea unguium: Secondary | ICD-10-CM | POA: Diagnosis not present

## 2015-12-16 DIAGNOSIS — M79676 Pain in unspecified toe(s): Secondary | ICD-10-CM | POA: Diagnosis not present

## 2015-12-16 NOTE — Progress Notes (Signed)
She presents today with chief complaint of painful elongated toenails 1 through 5 bilateral.  Objective: Vital signs are stable alert and oriented 3 pulses are strongly palpable. Neurologic sensorium is intact. Toenails are thick yellow dystrophic with mycotic and painful palpation.  Assessment: Pain in limb secondary to onychomycosis.  Plan: Debridement of toenails 1 through 5 bilateral.

## 2016-03-10 DIAGNOSIS — I739 Peripheral vascular disease, unspecified: Secondary | ICD-10-CM

## 2016-03-10 HISTORY — DX: Peripheral vascular disease, unspecified: I73.9

## 2016-03-23 ENCOUNTER — Ambulatory Visit (INDEPENDENT_AMBULATORY_CARE_PROVIDER_SITE_OTHER): Payer: Medicare Other | Admitting: Podiatry

## 2016-03-23 ENCOUNTER — Encounter: Payer: Self-pay | Admitting: Podiatry

## 2016-03-23 DIAGNOSIS — M79676 Pain in unspecified toe(s): Secondary | ICD-10-CM | POA: Diagnosis not present

## 2016-03-23 DIAGNOSIS — B351 Tinea unguium: Secondary | ICD-10-CM

## 2016-03-24 NOTE — Progress Notes (Signed)
She presents today with chief complaint of painful nails 1 through 5 bilateral.  Objective: Vital signs are stable alert and oriented 3. Pulses are palpable. Neurologic sensorium is intact. Due to reflexes are intact. Muscle strength is 5 over 5 dorsiflexion plantar flexors and inverters everters on physical sutures intact. Toenails are thick yellow dystrophic onychomycotic and painful on palpation.  Assessment: Pain in limb secondary to onychomycosis.  Plan: Debridement of toenails 1 through 5 bilateral.

## 2016-06-28 ENCOUNTER — Telehealth: Payer: Self-pay | Admitting: *Deleted

## 2016-06-28 ENCOUNTER — Ambulatory Visit (INDEPENDENT_AMBULATORY_CARE_PROVIDER_SITE_OTHER): Payer: Medicare Other | Admitting: Podiatry

## 2016-06-28 DIAGNOSIS — M66872 Spontaneous rupture of other tendons, left ankle and foot: Secondary | ICD-10-CM | POA: Diagnosis not present

## 2016-06-28 DIAGNOSIS — M25572 Pain in left ankle and joints of left foot: Secondary | ICD-10-CM

## 2016-06-28 DIAGNOSIS — R600 Localized edema: Secondary | ICD-10-CM

## 2016-06-28 DIAGNOSIS — G8929 Other chronic pain: Secondary | ICD-10-CM | POA: Diagnosis not present

## 2016-06-28 DIAGNOSIS — M79609 Pain in unspecified limb: Secondary | ICD-10-CM | POA: Diagnosis not present

## 2016-06-28 DIAGNOSIS — L603 Nail dystrophy: Secondary | ICD-10-CM

## 2016-06-28 DIAGNOSIS — B351 Tinea unguium: Secondary | ICD-10-CM

## 2016-06-28 DIAGNOSIS — L608 Other nail disorders: Secondary | ICD-10-CM

## 2016-06-28 DIAGNOSIS — E0843 Diabetes mellitus due to underlying condition with diabetic autonomic (poly)neuropathy: Secondary | ICD-10-CM

## 2016-06-28 NOTE — Progress Notes (Signed)
SUBJECTIVE Patient with a history of diabetes mellitus presents to office today complaining of elongated, thickened nails. Pain while ambulating in shoes. Patient is unable to trim their own nails.  Patient also complains of pain and tenderness the medial aspect of the left ankle. Patient states that it started this past summer after working and standing for long periods of time at work. Patient states that she eventually had to quit work because of so painful. Patient has been seen by her rheumatologist which took x-rays which were negative for fracture. Patient has ongoing pain and tenderness. Patient states that she's very unstable on uneven ground. No alleviating factors except for nonweightbearing.  Allergies  Allergen Reactions  . Lisinopril Anaphylaxis    Tongue swelling  . Sulfamethoxazole-Trimethoprim Other (See Comments)  . Tramadol Nausea Only    OBJECTIVE General Patient is awake, alert, and oriented x 3 and in no acute distress. Derm Skin is dry and supple bilateral. Negative open lesions or macerations. Remaining integument unremarkable. Nails are tender, long, thickened and dystrophic with subungual debris, consistent with onychomycosis, 1-5 bilateral. No signs of infection noted. Vasc  DP and PT pedal pulses palpable bilaterally. Temperature gradient within normal limits.  Neuro Epicritic and protective threshold sensation diminished bilaterally.  Musculoskeletal Exam Pain on palpation and forced inversion of the left medial ankle posterior to the medial malleolus directly overlying the posterior tibial tendon suspicious for possible posterior tibial tendon tear. Moderate edema noted with localized erythema. No symptomatic pedal deformities noted bilateral. Muscular strength within normal limits.  ASSESSMENT 1. Diabetes Mellitus w/ peripheral neuropathy 2. Onychomycosis of nail due to dermatophyte bilateral 3. Pain in foot bilateral 4. Possible posterior tibial tendon tear left  ankle 5. Edema, localized left medial ankle  PLAN OF CARE 1. Patient evaluated today. 2. Instructed to maintain good pedal hygiene and foot care. Stressed importance of controlling blood sugar.  3. Mechanical debridement of nails 1-5 bilaterally performed using a nail nipper. Filed with dremel without incident.  4. Order for MRI left ankle placed today 5. Return to clinic in 3 weeks to review MRI results   Edrick Kins, DPM [facility]  Dr. Edrick Kins, [address]                                        [place], [place] [address]                Office [phone]  Fax [phone]

## 2016-06-28 NOTE — Telephone Encounter (Addendum)
-----   Message from Edrick Kins, DPM sent at 06/28/2016  8:42 AM EST ----- Regarding: MRI left ankle Please order MRI left ankle w/out contrast here in [place].   Dx: possible posterior tibial tendon tear left. Edema left medial ankle.   Thanks, Dr. Amalia Hailey. Faxed orders to [facility], gave orders to D. [place] for [employer].

## 2016-07-07 ENCOUNTER — Ambulatory Visit: Payer: Medicare Other

## 2016-07-16 ENCOUNTER — Ambulatory Visit
Admission: RE | Admit: 2016-07-16 | Discharge: 2016-07-16 | Disposition: A | Discharge status: Home / Self Care | Payer: Medicare Other | Source: Ambulatory Visit | Attending: Podiatry | Admitting: Podiatry

## 2016-07-16 DIAGNOSIS — R600 Localized edema: Secondary | ICD-10-CM | POA: Diagnosis present

## 2016-07-16 DIAGNOSIS — M65879 Other synovitis and tenosynovitis, unspecified ankle and foot: Secondary | ICD-10-CM | POA: Diagnosis not present

## 2016-07-19 ENCOUNTER — Ambulatory Visit (INDEPENDENT_AMBULATORY_CARE_PROVIDER_SITE_OTHER): Payer: Medicare Other | Admitting: Podiatry

## 2016-07-19 DIAGNOSIS — M76822 Posterior tibial tendinitis, left leg: Secondary | ICD-10-CM | POA: Diagnosis not present

## 2016-07-19 DIAGNOSIS — M25572 Pain in left ankle and joints of left foot: Secondary | ICD-10-CM

## 2016-07-19 DIAGNOSIS — M7752 Other enthesopathy of left foot: Secondary | ICD-10-CM

## 2016-07-19 DIAGNOSIS — M659 Synovitis and tenosynovitis, unspecified: Secondary | ICD-10-CM | POA: Diagnosis not present

## 2016-07-30 MED ORDER — BETAMETHASONE SOD PHOS & ACET 6 (3-3) MG/ML IJ SUSP: 3.0000 mg | Freq: Once | INTRAMUSCULAR | Status: DC

## 2016-07-30 NOTE — Progress Notes (Signed)
   Subjective:  Patient presents today for follow-up evaluation of left ankle pain. Patient continues to have pain. On last visit an MRI was ordered for the patient's left ankle joint. Patient presents today to review MRI results modalities.  Objective / Physical Exam:  General:  The patient is alert and oriented x3 in no acute distress. Dermatology:  Skin is warm, dry and supple bilateral lower extremities. Negative for open lesions or macerations. Vascular:  Palpable pedal pulses bilaterally. No edema or erythema noted. Capillary refill within normal limits. Neurological:  Epicritic and protective threshold grossly intact bilaterally.  Musculoskeletal Exam:  Pain on palpation to the anterior lateral medial aspects of the patient's left ankle. Mild edema noted.  Range of motion within normal limits to all pedal and ankle joints bilateral. Muscle strength 5/5 in all groups bilateral.   MRI IMPRESSION:  1. Tibialis posterior tenosynovitis and distal tendinopathy, correlate clinically and assessing for tibialis posterior dysfunction. There is also a flexor digitorum longus and flexor hallucis longus tenosynovitis proximal to the knot of [place] area 2. Moderate degenerative chondral thinning in the tibiotalar joint and subtalar joints.  Assessment: #1 pain in left ankle #2 synovitis of left ankle #3 capsulitis of left ankle  Plan of Care:  #1 Patient was evaluated. MRI results were reviewed today. #2 injection of 0.5 mL Celestone Soluspan injected in the patient's left ankle. #3 injection 0.5 mL Celestone Soluspan injected in the posterior tibial tendon sheath left lower extremity #4 ankle brace was dispensed today #5 return to clinic in 4 weeks  Discussed possible arthroscopic ankle synovectomy next visit if patient fails to show improvement.  Edrick Kins, DPM [facility]  Dr. Edrick Kins, [place]                                          [place], [place] [address]                Office [phone]  Fax [phone]

## 2016-08-16 ENCOUNTER — Ambulatory Visit (INDEPENDENT_AMBULATORY_CARE_PROVIDER_SITE_OTHER): Payer: Medicare Other | Admitting: Podiatry

## 2016-08-16 DIAGNOSIS — M76822 Posterior tibial tendinitis, left leg: Secondary | ICD-10-CM

## 2016-08-16 DIAGNOSIS — M659 Synovitis and tenosynovitis, unspecified: Secondary | ICD-10-CM | POA: Diagnosis not present

## 2016-08-16 DIAGNOSIS — M25572 Pain in left ankle and joints of left foot: Secondary | ICD-10-CM

## 2016-08-16 DIAGNOSIS — M7752 Other enthesopathy of left foot: Secondary | ICD-10-CM | POA: Diagnosis not present

## 2016-08-26 MED ORDER — BETAMETHASONE SOD PHOS & ACET 6 (3-3) MG/ML IJ SUSP: 3.0000 mg | Freq: Once | INTRAMUSCULAR | Status: DC

## 2016-08-26 NOTE — Progress Notes (Signed)
   Subjective:  Patient presents today for follow-up evaluation of pain and tenderness to the left ankle joint. Patient states that she does have some improvement but is still little bit sensitive.  Objective / Physical Exam:  General:  The patient is alert and oriented x3 in no acute distress. Dermatology:  Skin is warm, dry and supple bilateral lower extremities. Negative for open lesions or macerations. Vascular:  Palpable pedal pulses bilaterally. No edema or erythema noted. Capillary refill within normal limits. Neurological:  Epicritic and protective threshold grossly intact bilaterally.  Musculoskeletal Exam:  Pain on palpation to the anterior lateral medial aspects of the patient's left ankle. Mild edema noted. A new finding of pain on palpation noted overlying the posterior tibial tendon medial ankle.  Range of motion within normal limits to all pedal and ankle joints bilateral. Muscle strength 5/5 in all groups bilateral.   MRI IMPRESSION:  1. Tibialis posterior tenosynovitis and distal tendinopathy, correlate clinically and assessing for tibialis posterior dysfunction. There is also a flexor digitorum longus and flexor hallucis longus tenosynovitis proximal to the knot of Henry area 2. Moderate degenerative chondral thinning in the tibiotalar joint and subtalar joints.  Assessment: #1 pain in left ankle with capsulitis and synovitis #2 posterior tibial tendinitis left lower extremity  Plan of Care:  #1 Patient was evaluated. Today the patient seems to be approximately 75-80% better.  #2 injection of 0.5 mL Celestone Soluspan injected in the patient's left ankle. #3 injection 0.5 mL Celestone Soluspan injected in the posterior tibial tendon sheath left lower extremity #4 plantar fascial brace dispensed today. Patient states that she does not tolerate the ankle brace. #5 return to clinic in 4 weeks  Discussed possible arthroscopic ankle synovectomy next visit if patient  fails to show improvement.  Edrick Kins, DPM [facility]  Dr. Edrick Kins, [place]                                        [place], [place] [address]                Office [phone]  Fax [phone]

## 2016-09-13 ENCOUNTER — Ambulatory Visit: Payer: Medicare Other | Admitting: Podiatry

## 2016-09-23 ENCOUNTER — Ambulatory Visit: Payer: Medicare Other | Admitting: Podiatry

## 2016-10-03 ENCOUNTER — Emergency Department
Admission: EM | Admit: 2016-10-03 | Discharge: 2016-10-03 | Disposition: A | Discharge status: Home / Self Care | Payer: Medicare Other | Attending: Emergency Medicine | Admitting: Emergency Medicine

## 2016-10-03 ENCOUNTER — Emergency Department: Payer: Medicare Other

## 2016-10-03 ENCOUNTER — Encounter: Payer: Self-pay | Admitting: Emergency Medicine

## 2016-10-03 DIAGNOSIS — E1122 Type 2 diabetes mellitus with diabetic chronic kidney disease: Secondary | ICD-10-CM | POA: Diagnosis not present

## 2016-10-03 DIAGNOSIS — Z7984 Long term (current) use of oral hypoglycemic drugs: Secondary | ICD-10-CM | POA: Insufficient documentation

## 2016-10-03 DIAGNOSIS — N183 Chronic kidney disease, stage 3 (moderate): Secondary | ICD-10-CM | POA: Diagnosis not present

## 2016-10-03 DIAGNOSIS — Z87891 Personal history of nicotine dependence: Secondary | ICD-10-CM | POA: Insufficient documentation

## 2016-10-03 DIAGNOSIS — M25552 Pain in left hip: Secondary | ICD-10-CM | POA: Diagnosis not present

## 2016-10-03 NOTE — ED Triage Notes (Signed)
Pt to ed with c/o left groin pain since last night, pt states worse with movement and ambulation.  Pt reports hx of blood clots and states this feels the same as then. Pt is not on blood thinners at this time.  Last use of blood thinners was 2014.

## 2016-10-03 NOTE — ED Provider Notes (Signed)
[facility] Emergency Department Provider Note  ___________________________________________   I have reviewed the triage vital signs and the nursing notes.   HISTORY  Chief Complaint Groin Pain   History limited by: Not Limited   HPI Natalie Watts is a 71 y.o. female who presents to the emergency department today with concerns for left hip pain. Is located in the left inguinal canal. The patient states the pain started overnight. She does not recall any unusual activity yesterday. The pain does not go down her leg. She does not have any associated numbness or tingling down her leg. States that the pain is worse with movement. She has had similar pain once before a few years ago and was diagnosed with a DVT. It appears that they were not able to determine the cause of the DVT. Patient denies any fevers chest pain or shortness breath.   Past Medical History:  Diagnosis Date  . Blood clotting disorder ([place])   . Degenerative disk disease   . Diabetes [facility])     Patient Active Problem List   Diagnosis Date Noted  . Chronic kidney disease (CKD), stage III (moderate) 11/07/2014  . Cervical spondylosis without myelopathy 06/05/2014  . CMC arthritis, thumb, degenerative 06/05/2014  . Diabetes ([place]) 04/16/2013  . BP (high blood pressure) 04/16/2013  . Nerve root inflammation 10/19/2012    Past Surgical History:  Procedure Laterality Date  . BACK SURGERY    . COLON SURGERY      Prior to Admission medications   Medication Sig Start Date End Date Taking? Authorizing Provider  acetaminophen (TYLENOL) 650 MG CR tablet Take 650 mg by mouth.    Historical Provider, MD  amLODipine (NORVASC) 5 MG tablet  08/28/14   Historical Provider, MD  Calcium Carbonate-Vitamin D 600-400 MG-UNIT per tablet Take by mouth.    Historical Provider, MD  Cyanocobalamin (VITAMIN B-12 CR) 1000 MCG TBCR Take by mouth. 01/17/12   Historical Provider, MD  diclofenac sodium (VOLTAREN) 1 %  GEL  09/01/14   Historical Provider, MD  EPIPEN 2-PAK 0.3 MG/0.3ML SOAJ injection  09/01/14   Historical Provider, MD  hydrocortisone 2.5 % cream Apply topically.    Historical Provider, MD  magnesium oxide (MAG-OX) 400 MG tablet Take 400 mg by mouth. 01/17/12   Historical Provider, MD  metFORMIN (GLUCOPHAGE) 500 MG tablet  11/18/13   Historical Provider, MD  pravastatin (PRAVACHOL) 40 MG tablet Take 40 mg by mouth. 08/28/14   Historical Provider, MD  sitaGLIPtin (JANUVIA) 100 MG tablet Take 100 mg by mouth. 02/06/16   Historical Provider, MD  [facility] TEST test strip  12/02/13   Historical Provider, MD  valACYclovir (VALTREX) 500 MG tablet  10/09/14   Historical Provider, MD    Allergies Lisinopril; Sulfamethoxazole-trimethoprim; and Tramadol  Family History  Problem Relation Age of Onset  . Hypertension Mother   . Cancer Father   . Hypertension Sister   . Diabetes Sister     Social History Social History  Substance Use Topics  . Smoking status: Former Smoker    Types: Cigarettes    Quit date: 12/18/2012  . Smokeless tobacco: Never Used  . Alcohol use No    Review of Systems  Constitutional: Negative for fever. Cardiovascular: Negative for chest pain. Respiratory: Negative for shortness of breath. Gastrointestinal: Negative for abdominal pain, vomiting and diarrhea. Genitourinary: Negative for dysuria. Musculoskeletal: Positive for left inguinal pain. Skin: Negative for rash. Neurological: Negative for headaches, focal weakness or numbness.  10-point ROS  otherwise negative.  ____________________________________________   PHYSICAL EXAM:  VITAL SIGNS: ED Triage Vitals  Enc Vitals Group     BP 10/03/16 1300 (!) 145/76     Pulse Rate 10/03/16 1300 83     Resp 10/03/16 1300 16     Temp 10/03/16 1300 99.3 F (37.4 C)     Temp Source 10/03/16 1300 Oral     SpO2 10/03/16 1300 96 %     Weight 10/03/16 1300 163 lb (73.9 kg)     Height 10/03/16 1300 5\' 1"  (1.549 m)     Head  Circumference --      Peak Flow --      Pain Score 10/03/16 1259 4   Constitutional: Alert and oriented. Well appearing and in no distress. Eyes: Conjunctivae are normal. Normal extraocular movements. ENT   Head: Normocephalic and atraumatic.   Nose: No congestion/rhinnorhea.   Mouth/Throat: Mucous membranes are moist.   Neck: No stridor. Hematological/Lymphatic/Immunilogical: No cervical lymphadenopathy. Cardiovascular: Normal rate, regular rhythm.  No murmurs, rubs, or gallops.  Respiratory: Normal respiratory effort without tachypnea nor retractions. Breath sounds are clear and equal bilaterally. No wheezes/rales/rhonchi. Gastrointestinal: Soft and non tender. No rebound. No guarding.  Genitourinary: Deferred Musculoskeletal: Normal range of motion in all extremities. No lower extremity edema. Neurologic:  Normal speech and language. No gross focal neurologic deficits are appreciated.  Skin:  Skin is warm, dry and intact. No rash noted. Psychiatric: Mood and affect are normal. Speech and behavior are normal. Patient exhibits appropriate insight and judgment.  ____________________________________________    LABS (pertinent positives/negatives)  None  ____________________________________________   EKG  None  ____________________________________________    RADIOLOGY  [place] lower extremity IMPRESSION:  1. No evidence of acute deep venous thrombosis of the left lower  extremity.  2. 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     ____________________________________________   PROCEDURES  Procedures  ____________________________________________   INITIAL IMPRESSION / ASSESSMENT AND PLAN / ED COURSE  Pertinent labs & imaging results that were available during my care of the patient were reviewed by me and considered in my medical decision making (see chart for details).  She  presented to the emergency department today because of left hip pain and concern for DVT. Ultrasound did not show any DVT. Given the fact that the pain is worse with movement and do wonder if it is more muscle skeletal positive pain. Advised patient to try icing and Tylenol. Patient has follow-up appointment already scheduled with primary care next week. Discussed return precautions.  ____________________________________________   FINAL CLINICAL IMPRESSION(S) / ED DIAGNOSES  Final diagnoses:  Left hip pain     Note: This dictation was prepared with Dragon dictation. Any transcriptional errors that result from this process are unintentional     Nance Pear, MD 10/03/16 1544

## 2016-10-03 NOTE — Discharge Instructions (Signed)
Please seek medical attention for any high fevers, chest pain, shortness of breath, change in behavior, persistent vomiting, bloody stool or any other new or concerning symptoms.  

## 2016-11-22 ENCOUNTER — Ambulatory Visit: Payer: Medicare Other | Admitting: Podiatry

## 2016-11-22 ENCOUNTER — Ambulatory Visit (INDEPENDENT_AMBULATORY_CARE_PROVIDER_SITE_OTHER): Payer: Medicare Other | Admitting: Podiatry

## 2016-11-22 ENCOUNTER — Encounter: Payer: Self-pay | Admitting: Podiatry

## 2016-11-22 DIAGNOSIS — M76822 Posterior tibial tendinitis, left leg: Secondary | ICD-10-CM

## 2016-11-22 DIAGNOSIS — M79676 Pain in unspecified toe(s): Secondary | ICD-10-CM

## 2016-11-22 DIAGNOSIS — M659 Synovitis and tenosynovitis, unspecified: Secondary | ICD-10-CM | POA: Diagnosis not present

## 2016-11-22 DIAGNOSIS — B351 Tinea unguium: Secondary | ICD-10-CM

## 2016-11-22 DIAGNOSIS — E0842 Diabetes mellitus due to underlying condition with diabetic polyneuropathy: Secondary | ICD-10-CM

## 2016-11-23 NOTE — Progress Notes (Signed)
   SUBJECTIVE Patient with a history of diabetes mellitus presents to office today complaining of elongated, thickened nails. Pain while ambulating in shoes. Patient is unable to trim their own nails. She is also here for follow-up evaluation of intermittent left ankle pain. She is concerned that the injections may be raising her blood sugars/A1c. She has been wearing the fascial brace with no significant relief.  OBJECTIVE General Patient is awake, alert, and oriented x 3 and in no acute distress. Derm Skin is dry and supple bilateral. Negative open lesions or macerations. Remaining integument unremarkable. Nails are tender, long, thickened and dystrophic with subungual debris, consistent with onychomycosis, 1-5 bilateral. No signs of infection noted. Vasc  DP and PT pedal pulses palpable bilaterally. Temperature gradient within normal limits.  Neuro Epicritic and protective threshold sensation diminished bilaterally.  Musculoskeletal Exam pain with palpation to the left PT tendon and left ankle. No symptomatic pedal deformities noted bilateral. Muscular strength within normal limits.  ASSESSMENT 1. Diabetes Mellitus w/ peripheral neuropathy 2. Onychomycosis of nail due to dermatophyte bilateral 3. Pain in foot bilateral 4. Left PT tendinitis  5. Left ankle synovitis.  PLAN OF CARE 1. Patient evaluated today. 2. Instructed to maintain good pedal hygiene and foot care. Stressed importance of controlling blood sugar.  3. Mechanical debridement of nails 1-5 bilaterally performed using a nail nipper. Filed with dremel without incident.  4. Continue wearing compression anklet. 5. Return to clinic when necessary.    Edrick Kins, DPM [facility]  Dr. Edrick Kins, [place]                                        [place], [place] [address]                Office [phone]  Fax [phone]

## 2016-12-06 ENCOUNTER — Ambulatory Visit: Payer: Medicare Other | Admitting: Podiatry

## 2016-12-15 DIAGNOSIS — M659 Synovitis and tenosynovitis, unspecified: Secondary | ICD-10-CM | POA: Insufficient documentation

## 2016-12-15 DIAGNOSIS — M65979 Unspecified synovitis and tenosynovitis, unspecified ankle and foot: Secondary | ICD-10-CM

## 2016-12-15 HISTORY — DX: Synovitis and tenosynovitis, unspecified: M65.9

## 2016-12-15 HISTORY — DX: Unspecified synovitis and tenosynovitis, unspecified ankle and foot: M65.979

## 2017-01-16 DIAGNOSIS — M1710 Unilateral primary osteoarthritis, unspecified knee: Secondary | ICD-10-CM | POA: Insufficient documentation

## 2017-01-16 DIAGNOSIS — M179 Osteoarthritis of knee, unspecified: Secondary | ICD-10-CM

## 2017-01-16 HISTORY — DX: Osteoarthritis of knee, unspecified: M17.9

## 2017-01-23 ENCOUNTER — Other Ambulatory Visit: Payer: Self-pay | Admitting: Family Medicine

## 2017-01-23 DIAGNOSIS — Z1231 Encounter for screening mammogram for malignant neoplasm of breast: Secondary | ICD-10-CM

## 2017-01-24 ENCOUNTER — Ambulatory Visit (INDEPENDENT_AMBULATORY_CARE_PROVIDER_SITE_OTHER): Payer: Medicare Other | Admitting: Podiatry

## 2017-01-24 DIAGNOSIS — A6000 Herpesviral infection of urogenital system, unspecified: Secondary | ICD-10-CM

## 2017-01-24 DIAGNOSIS — M1990 Unspecified osteoarthritis, unspecified site: Secondary | ICD-10-CM | POA: Insufficient documentation

## 2017-01-24 DIAGNOSIS — M5416 Radiculopathy, lumbar region: Secondary | ICD-10-CM | POA: Insufficient documentation

## 2017-01-24 DIAGNOSIS — I739 Peripheral vascular disease, unspecified: Secondary | ICD-10-CM | POA: Insufficient documentation

## 2017-01-24 DIAGNOSIS — M659 Synovitis and tenosynovitis, unspecified: Secondary | ICD-10-CM | POA: Diagnosis not present

## 2017-01-24 DIAGNOSIS — E785 Hyperlipidemia, unspecified: Secondary | ICD-10-CM | POA: Insufficient documentation

## 2017-01-24 DIAGNOSIS — B372 Candidiasis of skin and nail: Secondary | ICD-10-CM | POA: Insufficient documentation

## 2017-01-24 DIAGNOSIS — E669 Obesity, unspecified: Secondary | ICD-10-CM

## 2017-01-24 DIAGNOSIS — M159 Polyosteoarthritis, unspecified: Secondary | ICD-10-CM | POA: Insufficient documentation

## 2017-01-24 DIAGNOSIS — E0842 Diabetes mellitus due to underlying condition with diabetic polyneuropathy: Secondary | ICD-10-CM

## 2017-01-24 DIAGNOSIS — M5412 Radiculopathy, cervical region: Secondary | ICD-10-CM

## 2017-01-24 DIAGNOSIS — K219 Gastro-esophageal reflux disease without esophagitis: Secondary | ICD-10-CM

## 2017-01-24 DIAGNOSIS — G4700 Insomnia, unspecified: Secondary | ICD-10-CM

## 2017-01-24 DIAGNOSIS — M79676 Pain in unspecified toe(s): Secondary | ICD-10-CM | POA: Diagnosis not present

## 2017-01-24 DIAGNOSIS — M47819 Spondylosis without myelopathy or radiculopathy, site unspecified: Secondary | ICD-10-CM | POA: Insufficient documentation

## 2017-01-24 DIAGNOSIS — R2689 Other abnormalities of gait and mobility: Secondary | ICD-10-CM

## 2017-01-24 DIAGNOSIS — E559 Vitamin D deficiency, unspecified: Secondary | ICD-10-CM | POA: Insufficient documentation

## 2017-01-24 DIAGNOSIS — B351 Tinea unguium: Secondary | ICD-10-CM | POA: Diagnosis not present

## 2017-01-24 DIAGNOSIS — G5600 Carpal tunnel syndrome, unspecified upper limb: Secondary | ICD-10-CM | POA: Insufficient documentation

## 2017-01-24 HISTORY — DX: Candidiasis of skin and nail: B37.2

## 2017-01-24 HISTORY — DX: Other abnormalities of gait and mobility: R26.89

## 2017-01-24 HISTORY — DX: Polyosteoarthritis, unspecified: M15.9

## 2017-01-24 HISTORY — DX: Insomnia, unspecified: G47.00

## 2017-01-24 HISTORY — DX: Radiculopathy, cervical region: M54.12

## 2017-01-24 HISTORY — DX: Obesity, unspecified: E66.9

## 2017-01-24 HISTORY — DX: Herpesviral infection of urogenital system, unspecified: A60.00

## 2017-01-24 HISTORY — DX: Gastro-esophageal reflux disease without esophagitis: K21.9

## 2017-01-28 MED ORDER — BETAMETHASONE SOD PHOS & ACET 6 (3-3) MG/ML IJ SUSP: 3.0000 mg | Freq: Once | INTRAMUSCULAR | Status: DC

## 2017-01-28 NOTE — Progress Notes (Signed)
   SUBJECTIVE Patient with a history of diabetes mellitus presents to office today complaining of elongated, thickened nails. Pain while ambulating in shoes. Patient is unable to trim their own nails.  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 Patient has interested today in learning more about Synvisc injections  OBJECTIVE General Patient is awake, alert, and oriented x 3 and in no acute distress. Derm Skin is dry and supple bilateral. Negative open lesions or macerations. Remaining integument unremarkable. Nails are tender, long, thickened and dystrophic with subungual debris, consistent with onychomycosis, 1-5 bilateral. No signs of infection noted. Vasc  DP and PT pedal pulses palpable bilaterally. Temperature gradient within normal limits.  Neuro Epicritic and protective threshold sensation diminished bilaterally.  Musculoskeletal Exam No symptomatic pedal deformities noted bilateral. Muscular strength within normal limits. Pain on palpation noted to the anterior medial and lateral aspects the patient's left ankle joint consistent with ankle joint DJD.  ASSESSMENT 1. Diabetes Mellitus w/ peripheral neuropathy 2. Onychomycosis of nail due to dermatophyte bilateral 3. Left ankle joint synovitis/DJD  PLAN OF CARE 1. Patient evaluated today. 2. Instructed to maintain good pedal hygiene and foot care. Stressed importance of controlling blood sugar.  3. Mechanical debridement of nails 1-5 bilaterally performed using a nail nipper. Filed with dremel without incident.  4. Today we discussed Synvisc vs stem cell injection vs steroidal anti-inflammatory injection. We also discussed the benefits versus disadvantages of each modality 5. The patient opted for steroidal anti-inflammatory injection today. Injection of 0.5 mL Celestone Soluspan injected in the patient's left ankle joint 6. Return to clinic in 3 months for routine nail care    Edrick Kins, DPM [facility]  Dr. Edrick Kins, [place]                                        [place],  [address]                Office [phone]  Fax [phone]

## 2017-02-08 ENCOUNTER — Ambulatory Visit
Admission: RE | Admit: 2017-02-08 | Discharge: 2017-02-08 | Disposition: A | Discharge status: Home / Self Care | Payer: Medicare Other | Source: Ambulatory Visit | Attending: Family Medicine | Admitting: Family Medicine

## 2017-02-08 DIAGNOSIS — Z1231 Encounter for screening mammogram for malignant neoplasm of breast: Secondary | ICD-10-CM | POA: Diagnosis present

## 2017-04-28 ENCOUNTER — Ambulatory Visit: Payer: Medicare Other | Admitting: Podiatry

## 2017-04-28 DIAGNOSIS — M76822 Posterior tibial tendinitis, left leg: Secondary | ICD-10-CM | POA: Diagnosis not present

## 2017-04-28 DIAGNOSIS — M79676 Pain in unspecified toe(s): Secondary | ICD-10-CM | POA: Diagnosis not present

## 2017-04-28 DIAGNOSIS — B351 Tinea unguium: Secondary | ICD-10-CM | POA: Diagnosis not present

## 2017-04-28 DIAGNOSIS — M659 Synovitis and tenosynovitis, unspecified: Secondary | ICD-10-CM | POA: Diagnosis not present

## 2017-04-28 DIAGNOSIS — E0842 Diabetes mellitus due to underlying condition with diabetic polyneuropathy: Secondary | ICD-10-CM

## 2017-05-01 NOTE — Progress Notes (Signed)
   SUBJECTIVE Patient with a history of diabetes mellitus presents to office today complaining of elongated, thickened nails. Pain while ambulating in shoes. Patient is unable to trim their own nails.  Patient is also here for follow up evaluation of left ankle pain. She reports significant relief after receiving the injection previously and is requesting another one. There are no modifying factors noted. He is here for further evaluation and treatment.    Past Medical History:  Diagnosis Date  . Blood clotting disorder ([place])   . Degenerative disk disease   . Diabetes ([place])      OBJECTIVE General Patient is awake, alert, and oriented x 3 and in no acute distress. Derm Skin is dry and supple bilateral. Negative open lesions or macerations. Remaining integument unremarkable. Nails are tender, long, thickened and dystrophic with subungual debris, consistent with onychomycosis, 1-5 bilateral. No signs of infection noted. Vasc  DP and PT pedal pulses palpable bilaterally. Temperature gradient within normal limits.  Neuro Epicritic and protective threshold sensation diminished bilaterally.  Musculoskeletal Exam Pain on palpation noted to the posterior tibial tendon of the left foot. Muscular strength within normal limits. Pain on palpation noted to the anterior medial and lateral aspects the patient's left ankle joint.  ASSESSMENT 1. Diabetes Mellitus w/ peripheral neuropathy 2. Onychomycosis of nail due to dermatophyte bilateral 3. Left ankle joint synovitis 4. Left PT tendinitis   PLAN OF CARE 1. Patient evaluated today. 2. Instructed to maintain good pedal hygiene and foot care. Stressed importance of controlling blood sugar.  3. Mechanical debridement of nails 1-5 bilaterally performed using a nail nipper. Filed with dremel without incident.  4. Injection of 0.5 mLs Celestone Soluspan injected into the left ankle joint. 5. Injection of 0.5 mLs Celestone Soluspan injected into the left  posterior tibial tendon sheath.  6. Return to clinic in 3 months.    Edrick Kins, DPM [facility]  Dr. Edrick Kins, [place]                                        [place], [place] [address]                Office [phone]  Fax [phone]

## 2017-05-05 ENCOUNTER — Ambulatory Visit: Payer: Self-pay | Admitting: *Deleted

## 2017-05-05 NOTE — Telephone Encounter (Signed)
  Pt  Reports  Stuffy  Nose now   Started  Off  A   Runny  No  sorethroat no  Fever   Speaking  In  Complete  sentances   Pt  Advised  Home  Treatment   And  To   Drink  Lots  Of  Fluid  Followup  At  An urgent care  If  Symptoms  Worse  Has  An  appt dec  13   With  [employer]   Reason for Disposition . Cold with no complications  Answer Assessment - Initial Assessment Questions 1. ONSET: "When did the nasal discharge start?"      4   Days  Ago    Started   Out  As  Clear   Drainage     Now  Is  stuffy 2. AMOUNT: "How much discharge is there?"        Small  Amount   3. COUGH: "Do you have a cough?" If yes, ask: "Describe the color of your sputum" (clear, white, yellow, green)     None 4. RESPIRATORY DISTRESS: "Describe your breathing."       normal 5. FEVER: "Do you have a fever?" If so, ask: "What is your temperature, how was it measured, and when did it start?"     No  Fever  6. SEVERITY: "Overall, how bad are you feeling right now?" (e.g., doesn't interfere with normal activities, staying home from school/work, staying in bed)       Overall   Not  Too  Bad    Went  To  Work  Today   7. OTHER SYMPTOMS: "Do you have any other symptoms?" (e.g., sore throat, earache, wheezing, vomiting)      none 8. PREGNANCY: "Is there any chance you are pregnant?" "When was your last menstrual period?"      no  Protocols used: COMMON COLD-A-AH

## 2017-06-01 ENCOUNTER — Encounter: Payer: Self-pay | Admitting: Internal Medicine

## 2017-06-01 ENCOUNTER — Ambulatory Visit: Payer: Medicare Other | Admitting: Internal Medicine

## 2017-06-01 VITALS — BP 128/70 | HR 89 | Temp 98.0°F | Ht 62.0 in | Wt 171.2 lb

## 2017-06-01 DIAGNOSIS — I10 Essential (primary) hypertension: Secondary | ICD-10-CM

## 2017-06-01 DIAGNOSIS — R011 Cardiac murmur, unspecified: Secondary | ICD-10-CM

## 2017-06-01 DIAGNOSIS — E1122 Type 2 diabetes mellitus with diabetic chronic kidney disease: Secondary | ICD-10-CM

## 2017-06-01 DIAGNOSIS — E785 Hyperlipidemia, unspecified: Secondary | ICD-10-CM | POA: Diagnosis not present

## 2017-06-01 DIAGNOSIS — R0609 Other forms of dyspnea: Secondary | ICD-10-CM

## 2017-06-01 DIAGNOSIS — Z1329 Encounter for screening for other suspected endocrine disorder: Secondary | ICD-10-CM

## 2017-06-01 DIAGNOSIS — I739 Peripheral vascular disease, unspecified: Secondary | ICD-10-CM | POA: Diagnosis not present

## 2017-06-01 DIAGNOSIS — Z8739 Personal history of other diseases of the musculoskeletal system and connective tissue: Secondary | ICD-10-CM | POA: Insufficient documentation

## 2017-06-01 DIAGNOSIS — E538 Deficiency of other specified B group vitamins: Secondary | ICD-10-CM

## 2017-06-01 DIAGNOSIS — N183 Chronic kidney disease, stage 3 unspecified: Secondary | ICD-10-CM

## 2017-06-01 DIAGNOSIS — R600 Localized edema: Secondary | ICD-10-CM

## 2017-06-01 DIAGNOSIS — R0989 Other specified symptoms and signs involving the circulatory and respiratory systems: Secondary | ICD-10-CM

## 2017-06-01 DIAGNOSIS — K219 Gastro-esophageal reflux disease without esophagitis: Secondary | ICD-10-CM | POA: Diagnosis not present

## 2017-06-01 DIAGNOSIS — B372 Candidiasis of skin and nail: Secondary | ICD-10-CM

## 2017-06-01 DIAGNOSIS — E1165 Type 2 diabetes mellitus with hyperglycemia: Secondary | ICD-10-CM | POA: Diagnosis not present

## 2017-06-01 DIAGNOSIS — R079 Chest pain, unspecified: Secondary | ICD-10-CM

## 2017-06-01 DIAGNOSIS — K635 Polyp of colon: Secondary | ICD-10-CM | POA: Diagnosis not present

## 2017-06-01 DIAGNOSIS — E559 Vitamin D deficiency, unspecified: Secondary | ICD-10-CM

## 2017-06-01 DIAGNOSIS — Z1159 Encounter for screening for other viral diseases: Secondary | ICD-10-CM

## 2017-06-01 HISTORY — DX: Personal history of other diseases of the musculoskeletal system and connective tissue: Z87.39

## 2017-06-01 LAB — GLUCOSE, POCT (MANUAL RESULT ENTRY): POC Glucose: 306 mg/dl — AB (ref 70–99)

## 2017-06-01 MED ORDER — LANTUS SOLOSTAR 100 UNIT/ML ~~LOC~~ SOPN: 45.0000 [IU] | PEN_INJECTOR | Freq: Every day | SUBCUTANEOUS | 2 refills | Status: DC

## 2017-06-01 NOTE — Patient Instructions (Signed)
Please follow up in 2 weeks  Please take calcium 600 mg 2x per day  Try Zeasorb AF Powder in skin folds to keep you dry We will refer you to cardiology  Please have labs before your next appointment  I will check on colonoscopy when due  We need to update your med list this will be done before you next visit    Food Choices for Gastroesophageal Reflux Disease, Adult When you have gastroesophageal reflux disease (GERD), the foods you eat and your eating habits are very important. Choosing the right foods can help ease your discomfort. What guidelines do I need to follow?  Choose fruits, vegetables, whole grains, and low-fat dairy products.  Choose low-fat meat, fish, and poultry.  Limit fats such as oils, salad dressings, butter, nuts, and avocado.  Keep a food diary. This helps you identify foods that cause symptoms.  Avoid foods that cause symptoms. These may be different for everyone.  Eat small meals often instead of 3 large meals a day.  Eat your meals slowly, in a place where you are relaxed.  Limit fried foods.  Cook foods using methods other than frying.  Avoid drinking alcohol.  Avoid drinking large amounts of liquids with your meals.  Avoid bending over or lying down until 2-3 hours after eating. What foods are not recommended? These are some foods and drinks that may make your symptoms worse: Vegetables Tomatoes. Tomato juice. Tomato and spaghetti sauce. Chili peppers. Onion and garlic. Horseradish. Fruits Oranges, grapefruit, and lemon (fruit and juice). Meats High-fat meats, fish, and poultry. This includes hot dogs, ribs, ham, sausage, salami, and bacon. Dairy Whole milk and chocolate milk. Sour cream. Cream. Butter. Ice cream. Cream cheese. Drinks Coffee and tea. Bubbly (carbonated) drinks or energy drinks. Condiments Hot sauce. Barbecue sauce. Sweets/Desserts Chocolate and cocoa. Donuts. Peppermint and spearmint. Fats and Oils High-fat foods. This  includes [place] fries and potato chips. Other Vinegar. Strong spices. This includes black pepper, white pepper, red pepper, cayenne, curry powder, cloves, ginger, and chili powder. The items listed above may not be a complete list of foods and drinks to avoid. Contact your dietitian for more information. This information is not intended to replace advice given to you by your health care provider. Make sure you discuss any questions you have with your health care provider. Document Released: 12/06/2011 Document Revised: 11/12/2015 Document Reviewed: 04/10/2013 Elsevier Interactive Patient Education  2017 Elsevier Inc.   Diabetes Mellitus and Food It is important for you to manage your blood sugar (glucose) level. Your blood glucose level can be greatly affected by what you eat. Eating healthier foods in the appropriate amounts throughout the day at about the same time each day will help you control your blood glucose level. It can also help slow or prevent worsening of your diabetes mellitus. Healthy eating may even help you improve the level of your blood pressure and reach or maintain a healthy weight. General recommendations for healthful eating and cooking habits include:  Eating meals and snacks regularly. Avoid going long periods of time without eating to lose weight.  Eating a diet that consists mainly of plant-based foods, such as fruits, vegetables, nuts, legumes, and whole grains.  Using low-heat cooking methods, such as baking, instead of high-heat cooking methods, such as deep frying.  Work with your dietitian to make sure you understand how to use the Nutrition Facts information on food labels. How can food affect me? Carbohydrates Carbohydrates affect your blood glucose level more  than any other type of food. Your dietitian will help you determine how many carbohydrates to eat at each meal and teach you how to count carbohydrates. Counting carbohydrates is important to keep your  blood glucose at a healthy level, especially if you are using insulin or taking certain medicines for diabetes mellitus. Alcohol Alcohol can cause sudden decreases in blood glucose (hypoglycemia), especially if you use insulin or take certain medicines for diabetes mellitus. Hypoglycemia can be a life-threatening condition. Symptoms of hypoglycemia (sleepiness, dizziness, and disorientation) are similar to symptoms of having too much alcohol. If your health care provider has given you approval to drink alcohol, do so in moderation and use the following guidelines:  Women should not have more than one drink per day, and men should not have more than two drinks per day. One drink is equal to: ? 12 oz of beer. ? 5 oz of wine. ? 1 oz of hard liquor.  Do not drink on an empty stomach.  Keep yourself hydrated. Have water, diet soda, or unsweetened iced tea.  Regular soda, juice, and other mixers might contain a lot of carbohydrates and should be counted.  What foods are not recommended? As you make food choices, it is important to remember that all foods are not the same. Some foods have fewer nutrients per serving than other foods, even though they might have the same number of calories or carbohydrates. It is difficult to get your body what it needs when you eat foods with fewer nutrients. Examples of foods that you should avoid that are high in calories and carbohydrates but low in nutrients include:  Trans fats (most processed foods list trans fats on the Nutrition Facts label).  Regular soda.  Juice.  Candy.  Sweets, such as cake, pie, doughnuts, and cookies.  Fried foods.  What foods can I eat? Eat nutrient-rich foods, which will nourish your body and keep you healthy. The food you should eat also will depend on several factors, including:  The calories you need.  The medicines you take.  Your weight.  Your blood glucose level.  Your blood pressure level.  Your  cholesterol level.  You should eat a variety of foods, including:  Protein. ? Lean cuts of meat. ? Proteins low in saturated fats, such as fish, egg whites, and beans. Avoid processed meats.  Fruits and vegetables. ? Fruits and vegetables that may help control blood glucose levels, such as apples, mangoes, and yams.  Dairy products. ? Choose fat-free or low-fat dairy products, such as milk, yogurt, and cheese.  Grains, bread, pasta, and rice. ? Choose whole grain products, such as multigrain bread, whole oats, and brown rice. These foods may help control blood pressure.  Fats. ? Foods containing healthful fats, such as nuts, avocado, olive oil, canola oil, and fish.  Does everyone with diabetes mellitus have the same meal plan? Because every person with diabetes mellitus is different, there is not one meal plan that works for everyone. It is very important that you meet with a dietitian who will help you create a meal plan that is just right for you. This information is not intended to replace advice given to you by your health care provider. Make sure you discuss any questions you have with your health care provider. Document Released: 03/03/2005 Document Revised: 11/12/2015 Document Reviewed: 05/03/2013 Elsevier Interactive Patient Education  2017 [employer].

## 2017-06-01 NOTE — Progress Notes (Signed)
Chief Complaint  Patient presents with  . Establish Care  . Diabetes    type 2 uncontrolled per daughter compliance issues    Establish new PCP former Dr. Corena Pilgrim. Daughter Natalie Watts her at office visit  1. DM 2 uncontrolled last A1C 9.4 (A1C before this 10/13/16 was 11.5) cbg 306 today not fasting on Lantus 45 units was on Glipizide 5-10, Januvia 100, Metformin ER 500 mg qd stopped orals and only on insulin since Summer 2018. She does report episodes of hypoglycemia and variable cbgs. Daughter reports she can be noncompliant with diet  -needs refill on insulin  2. C/o muscle cramps in legs occasionally reports not drinking enough water at times  3. C/o sob with exertion and also chest heaviness at night but she has noticed if elevates head of bed or walks helps.  She does have h/o severe GERD on Barium swallow and esophageal spasm reviewed.  4. H/o sinus infection 05/10/17 just completed Cefdinir 300 mg bid   Review of Systems  Constitutional: Negative for weight loss.  HENT: Negative for hearing loss.   Eyes:       +vision problems   Respiratory: Positive for shortness of breath.        +SOB with exertion   Cardiovascular: Positive for chest pain and leg swelling.  Gastrointestinal: Negative for abdominal pain.       +GERD sxs at night   Musculoskeletal: Positive for joint pain.       +muscle cramps   Skin: Positive for rash.  Neurological: Positive for sensory change.       +h/o numbness   Psychiatric/Behavioral: Negative for depression.   Past Medical History:  Diagnosis Date  . Blood clotting disorder ([place])   . Carotid bruit    US carotid had 09/03/14 [facility] [place]   . Carpal tunnel syndrome   . Cervical radiculopathy    improved since surgery 18-Sep-2014   . Chicken pox   . 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 Loann Quill [place]   . Degenerative disk disease   . Diabetes ([place])   . DVT, lower extremity ([place])    left, 05/2013  on Xarelto x 1 month etiology unkown   . GERD (gastroesophageal reflux disease)   . HSV-2 infection   . Hyperlipidemia   . Hypertension   . Insomnia   . Intertrigo   . Lumbar radiculopathy    improved after steroid inj and PT  . Peripheral vascular disease ([place])   . Subdeltoid bursitis   . Vitamin D deficiency    Past Surgical History:  Procedure Laterality Date  . 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 Sheppard Evens  . COLON SURGERY    . TUBAL LIGATION     Family History  Problem Relation Age of Onset  . Hypertension Mother   . Arthritis Mother   . Cancer Father        colon cancer dx'ed died age 48  . Hypertension Sister   . Arthritis Sister   . Cancer Sister        brain tumor h/o lung cancer smoker died 09-18-2015   . Early death Sister   . Breast cancer Maternal Grandmother   . Cancer Maternal Grandmother        breast dx older age   . Hypertension Maternal Grandmother   . Breast cancer Other        maternal neice  . Hypertension Daughter   .  Hypertension Daughter    Social History   Socioeconomic History  . Marital status: Divorced    Spouse name: Not on file  . Number of children: Not on file  . Years of education: Not on file  . Highest education level: Not on file  Social Needs  . Financial resource strain: Not on file  . Food insecurity - worry: Not on file  . Food insecurity - inability: Not on file  . Transportation needs - medical: Not on file  . Transportation needs - non-medical: Not on file  Occupational History  . Not on file  Tobacco Use  . Smoking status: Former Smoker    Types: Cigarettes    Last attempt to quit: 12/18/2012    Years since quitting: 4.4  . Smokeless tobacco: Never Used  Substance and Sexual Activity  . Alcohol use: No  . Drug use: Yes    Frequency: 3.0 times per week  . Sexual activity: Not on file  Other Topics Concern  . Not on file  Social History Narrative   Divorced    2 daughters Natalie Watts comes to most  appts and is Chief Executive Officer    Used to work for [employer] now [employer]    Former smoker age 32 to age 71/2012 quit for sure in 2014 max 1/2 ppd FH lung cancer sister also smoker    Current Meds  Medication Sig  . acetaminophen (TYLENOL) 650 MG CR tablet Take 650 mg by mouth.  Marland Kitchen amLODipine (NORVASC) 10 MG tablet amlodipine besylate 10 mg tabs  . aspirin EC 81 MG tablet   . Cholecalciferol (VITAMIN D3) 2000 units capsule Take by mouth.  . clotrimazole (LOTRIMIN) 1 % cream   . Cobalamine Combinations (B-12) [phone] MCG SUBL Place under the tongue.  . cyclobenzaprine (FLEXERIL) 5 MG tablet cyclobenzaprine 5 mg tablet  Take 1 tablet every day by oral route after meals for 30 days.  . diclofenac sodium (VOLTAREN) 1 % GEL   . diclofenac sodium (VOLTAREN) 1 % GEL diclofenac sodium 1 % gel  . docusate sodium (COLACE) 100 MG capsule Take 100 mg by mouth.  . EPIPEN 2-PAK 0.3 MG/0.3ML SOAJ injection   . glucose blood test strip onetouch     tes ultra bl  . guaiFENesin (MUCINEX) 600 MG 12 hr tablet Take by mouth 2 (two) times daily.  . hydrocortisone 2.5 % cream Apply topically.  Marland Kitchen LANTUS SOLOSTAR 100 UNIT/ML Solostar Pen Inject 45 Units into the skin daily after breakfast.  . magnesium oxide (MAG-OX) 400 MG tablet Take 400 mg by mouth.  . metFORMIN (GLUCOPHAGE) 500 MG tablet   . ONETOUCH DELICA LANCETS FINE MISC by Does not apply route.  . Powders (GOLD BOND BABY POWDER EX) Apply topically as needed.  . pravastatin (PRAVACHOL) 40 MG tablet Take 40 mg by mouth.  . Tdap (BOOSTRIX) 5-2.5-18.5 LF-MCG/0.5 injection Boostrix Tdap 2.5 Lf unit-8 mcg-5 Lf/0.5 mL intramuscular suspension  . triamcinolone cream (KENALOG) 0.1 % triamcinolone acetonide 0.1 % topical cream  . Trolamine Salicylate (ASPERCREME EX) Apply topically.  . valACYclovir (VALTREX) 500 MG tablet 500 mg. Bid x 3 days with outbreak 24 hours before.  . [DISCONTINUED] acyclovir (ZOVIRAX) 400 MG tablet Take by mouth.  . [DISCONTINUED]  benzonatate (TESSALON) 200 MG capsule benzonatate 200 mg caps  . [DISCONTINUED] Calcium Carbonate-Vitamin D 600-400 MG-UNIT per tablet Take by mouth.  . [DISCONTINUED] Cyanocobalamin (VITAMIN B-12 CR) 1000 MCG TBCR Take by mouth.  . [DISCONTINUED] LANTUS SOLOSTAR 100 UNIT/ML  Solostar Pen INJECT 45 UNITS INTO SKIN AT BEDTIME  . [DISCONTINUED] magnesium oxide (MAG-OX) 400 (241.3 Mg) MG tablet Take 400 tablets by mouth daily.   Current Facility-Administered Medications for the 06/01/17 encounter (Office Visit) with McLean-Scocuzza, Nino Glow, MD  Medication  . betamethasone acetate-betamethasone sodium phosphate (CELESTONE) injection 3 mg  . betamethasone acetate-betamethasone sodium phosphate (CELESTONE) injection 3 mg  . betamethasone acetate-betamethasone sodium phosphate (CELESTONE) injection 3 mg   Allergies  Allergen Reactions  . Lisinopril Anaphylaxis and Swelling    Tongue swelling Tongue swelling Tongue swelling Tongue swelling Tongue swelling Tongue swelling Tongue swelling Tongue swelling Tongue swelling Tongue swelling Tongue swelling Tongue swelling  . Sulfamethoxazole-Trimethoprim Other (See Comments)    Other reaction(s): Other (See Comments)  . Penicillins   . Tramadol Nausea Only and Nausea And Vomiting   Recent Results (from the past 2160 hour(s))  POCT Glucose (CBG)     Status: Abnormal   Collection Time: 06/01/17  2:51 PM  Result Value Ref Range   POC Glucose 306 (A) 70 - 99 mg/dl   Objective  Body mass index is 31.32 kg/m. Wt Readings from Last 3 Encounters:  06/01/17 171 lb 4 oz (77.7 kg)  10/03/16 163 lb (73.9 kg)  04/05/15 158 lb (71.7 kg)   Temp Readings from Last 3 Encounters:  06/01/17 98 F (36.7 C) (Oral)  10/03/16 99.3 F (37.4 C) (Oral)  04/05/15 99.1 F (37.3 C) (Oral)   BP Readings from Last 3 Encounters:  06/01/17 128/70  10/03/16 (!) 142/72  04/05/15 139/66   Pulse Readings from Last 3 Encounters:  06/01/17 89  10/03/16 79   04/05/15 87   O2 97% on room air   Physical Exam  Constitutional: She is oriented to person, place, and time and well-developed, well-nourished, and in no distress. Vital signs are normal.  HENT:  Head: Normocephalic and atraumatic.  Mouth/Throat: Oropharynx is clear and moist and mucous membranes are normal.  Eyes: Conjunctivae are normal. Pupils are equal, round, and reactive to light.  Cardiovascular: Normal rate and regular rhythm.  Murmur heard. 1 to 2+ leg edema b/l legs   Pulmonary/Chest: Effort normal and breath sounds normal. She has no wheezes.  Abdominal: Soft. Bowel sounds are normal. There is no tenderness.  Neurological: She is alert and oriented to person, place, and time. Gait normal. Gait normal.  Skin: Skin is warm, dry and intact.  Psychiatric: Mood, memory, affect and judgment normal.  Nursing note and vitals reviewed.  Assessment   1. DM 2 uncontrolled cbg 306 today and h/o hypoglycemia (complications CKD 3, vascular disease h/o cataracts) -reviewed last A1C 9.4 01/17/17 before that 10/13/16 11.5  -lipid 01/17/17 TC 195, HDL 55, TG 167, LDL 112  -urine protein and UA ahd 10/13/16   2. HTN controlled/HLD  3. Intertrigo  4. GERD  5. Cardiac murmur, chest heaviness ? GERD/esophageal spasm related r/o cardiac etiology and sob with exertion with h/o PVD  6. Vitamin B12 and vitamin D def  7. HM  8. Muscle cramps   Plan  1.   Continue Lanutus 45 units for now will likely need to titrate up  Ed pt to take insulin always with food   She was on sulfonylureas but d/c'ed IR and ER doses, Januvia 100 mg no longer taking, Metformin ER and Ir 500 mg qd no longer taking  Per pt she has seen nutrition in the past  Get records [place] [facility] 2018  Get records podiatry Dr. Laymond Purser  -  she sees podiatry for left foot swelling and ligament "frayed" and gets steroid injections q3 months per pt will get notes and review   Had UA and urine protein 4/46/18 will repeat  09/2017   2. Cont meds check labs in near future  3.  rec Zeasorb AF for maintenance  4. Pt does not want to resume omeprazole 20 mg for now Will try diet and lifestyle modifications  Given GERD food list   5. Refer to cardiology to eval with echo and stress test and EKG  6.  Check B12 and vit D in near future  7.  [facility]  Tdap had 02/14/11 pna 23 noted 11/27/08 Will check on prevnar, pna 23, zostavax and [facility]  Disc shingrix at f/u   Out of pap window no h/o abnormal pap   mammo neg 02/08/17   Colonoscopy Dr. Loann Quill 12/18/14 tubullovillous adenoma high grade dysphasia will call the office and see when due to f/u   Likely need to do CT chest screening lung cancer former smoker age 89 quit in 2014 max 1/2 ppd FH lung cancer   DEXA 10/30/13 osteopenia rec calcium 600 mg 2x per day OTC already on vit D3 2000 qd but per pt not taking currently.   Hep C neg 09/03/14. Will check hep B status   8. Check labs near future and increase hydration   Significant time >40-45 minutes spent reviewing history and medication reconcilliation. Daughter very helpful to contribute to history.   Need to obtain records  1) [facility] specialist  2) Dr. Sarina Ill rheumatology  3)Podiatry Dr. Laymond Purser  4.) [facility] renal (Dr. Katherene Ponto. [place]) 5) [facility] 2018 per pt h/o cataracts told years ago Provider: Dr. Olivia Mackie McLean-Scocuzza-Internal Medicine

## 2017-06-09 ENCOUNTER — Other Ambulatory Visit (INDEPENDENT_AMBULATORY_CARE_PROVIDER_SITE_OTHER): Payer: Medicare Other

## 2017-06-09 DIAGNOSIS — E1165 Type 2 diabetes mellitus with hyperglycemia: Secondary | ICD-10-CM | POA: Diagnosis not present

## 2017-06-09 DIAGNOSIS — Z1159 Encounter for screening for other viral diseases: Secondary | ICD-10-CM

## 2017-06-09 DIAGNOSIS — I10 Essential (primary) hypertension: Secondary | ICD-10-CM

## 2017-06-09 DIAGNOSIS — E559 Vitamin D deficiency, unspecified: Secondary | ICD-10-CM | POA: Diagnosis not present

## 2017-06-09 DIAGNOSIS — Z1329 Encounter for screening for other suspected endocrine disorder: Secondary | ICD-10-CM

## 2017-06-09 DIAGNOSIS — E538 Deficiency of other specified B group vitamins: Secondary | ICD-10-CM

## 2017-06-09 LAB — COMPREHENSIVE METABOLIC PANEL
ALBUMIN: 4.1 g/dL (ref 3.5–5.2)
ALT: 17 U/L (ref 0–35)
AST: 21 U/L (ref 0–37)
Alkaline Phosphatase: 129 U/L — ABNORMAL HIGH (ref 39–117)
BUN: 16 mg/dL (ref 6–23)
CALCIUM: 9.5 mg/dL (ref 8.4–10.5)
CHLORIDE: 103 meq/L (ref 96–112)
CO2: 28 mEq/L (ref 19–32)
CREATININE: 1.07 mg/dL (ref 0.40–1.20)
GFR: 64.86 mL/min (ref >60.00)
Glucose, Bld: 139 mg/dL — ABNORMAL HIGH (ref 70–99)
Potassium: 4.3 mEq/L (ref 3.5–5.1)
Sodium: 139 mEq/L (ref 135–145)
Total Bilirubin: 0.6 mg/dL (ref 0.2–1.2)
Total Protein: 7.7 g/dL (ref 6.0–8.3)

## 2017-06-09 LAB — CBC WITH DIFFERENTIAL/PLATELET
BASOS PCT: 0.4 % (ref 0.0–3.0)
Basophils Absolute: 0 10*3/uL (ref 0.0–0.1)
EOS PCT: 1.5 % (ref 0.0–5.0)
Eosinophils Absolute: 0.1 10*3/uL (ref 0.0–0.7)
HEMATOCRIT: 38.8 % (ref 36.0–46.0)
HEMOGLOBIN: 12.5 g/dL (ref 12.0–15.0)
LYMPHS PCT: 40.8 % (ref 12.0–46.0)
Lymphs Abs: 2.9 10*3/uL (ref 0.7–4.0)
MCHC: 32.1 g/dL (ref 30.0–36.0)
MCV: 85.6 fl (ref 78.0–100.0)
MONO ABS: 0.4 10*3/uL (ref 0.1–1.0)
MONOS PCT: 6 % (ref 3.0–12.0)
Neutro Abs: 3.6 10*3/uL (ref 1.4–7.7)
Neutrophils Relative %: 51.3 % (ref 43.0–77.0)
Platelets: 179 10*3/uL (ref 150.0–400.0)
RBC: 4.54 Mil/uL (ref 3.87–5.11)
RDW: 14.5 % (ref 11.5–15.5)
WBC: 7.1 10*3/uL (ref 4.0–10.5)

## 2017-06-09 LAB — LIPID PANEL
CHOL/HDL RATIO: 3
CHOLESTEROL: 163 mg/dL (ref 0–200)
HDL: 61.9 mg/dL (ref >39.00)
LDL CALC: 82 mg/dL (ref 0–99)
NonHDL: 100.87
TRIGLYCERIDES: 93 mg/dL (ref 0.0–149.0)
VLDL: 18.6 mg/dL (ref 0.0–40.0)

## 2017-06-09 LAB — HEMOGLOBIN A1C: Hgb A1c MFr Bld: 9.4 % — ABNORMAL HIGH (ref 4.6–6.5)

## 2017-06-09 LAB — T4, FREE: Free T4: 1.08 ng/dL (ref 0.60–1.60)

## 2017-06-09 LAB — MAGNESIUM: Magnesium: 1.8 mg/dL (ref 1.5–2.5)

## 2017-06-09 LAB — VITAMIN B12: Vitamin B-12: 509 pg/mL (ref 211–911)

## 2017-06-09 LAB — TSH: TSH: 2.54 u[IU]/mL (ref 0.35–4.50)

## 2017-06-09 LAB — VITAMIN D 25 HYDROXY (VIT D DEFICIENCY, FRACTURES): VITD: 17.45 ng/mL — ABNORMAL LOW (ref 30.00–100.00)

## 2017-06-10 LAB — HEPATITIS B SURFACE ANTIBODY, QUANTITATIVE

## 2017-06-10 LAB — HEPATITIS B CORE ANTIBODY, TOTAL: HEP B C TOTAL AB: NONREACTIVE

## 2017-06-10 LAB — HEPATITIS B SURFACE ANTIGEN: Hepatitis B Surface Ag: NONREACTIVE

## 2017-06-14 ENCOUNTER — Other Ambulatory Visit: Payer: Self-pay | Admitting: Internal Medicine

## 2017-06-14 DIAGNOSIS — E1165 Type 2 diabetes mellitus with hyperglycemia: Secondary | ICD-10-CM

## 2017-06-14 DIAGNOSIS — E119 Type 2 diabetes mellitus without complications: Secondary | ICD-10-CM

## 2017-06-14 DIAGNOSIS — Z794 Long term (current) use of insulin: Principal | ICD-10-CM

## 2017-06-14 DIAGNOSIS — E559 Vitamin D deficiency, unspecified: Secondary | ICD-10-CM

## 2017-06-14 MED ORDER — CHOLECALCIFEROL 1.25 MG (50000 UT) PO CAPS: 50000.0000 [IU] | ORAL_CAPSULE | ORAL | 1 refills | Status: DC

## 2017-06-14 MED ORDER — SITAGLIPTIN PHOSPHATE 100 MG PO TABS: 100.0000 mg | ORAL_TABLET | Freq: Every day | ORAL | 5 refills | Status: DC

## 2017-06-14 MED ORDER — METFORMIN HCL ER 500 MG PO TB24: 500.0000 mg | ORAL_TABLET | Freq: Every day | ORAL | Status: DC

## 2017-06-14 MED ORDER — LANTUS SOLOSTAR 100 UNIT/ML ~~LOC~~ SOPN: 53.0000 [IU] | PEN_INJECTOR | Freq: Every day | SUBCUTANEOUS | 2 refills | Status: DC

## 2017-06-15 ENCOUNTER — Ambulatory Visit: Payer: Medicare Other | Admitting: Internal Medicine

## 2017-06-15 ENCOUNTER — Other Ambulatory Visit: Payer: Self-pay

## 2017-06-15 ENCOUNTER — Telehealth: Payer: Self-pay | Admitting: Internal Medicine

## 2017-06-15 ENCOUNTER — Encounter: Payer: Self-pay | Admitting: Internal Medicine

## 2017-06-15 VITALS — BP 120/62 | HR 79 | Temp 98.6°F | Wt 170.2 lb

## 2017-06-15 DIAGNOSIS — I10 Essential (primary) hypertension: Secondary | ICD-10-CM | POA: Diagnosis not present

## 2017-06-15 DIAGNOSIS — E1122 Type 2 diabetes mellitus with diabetic chronic kidney disease: Secondary | ICD-10-CM

## 2017-06-15 DIAGNOSIS — E1165 Type 2 diabetes mellitus with hyperglycemia: Secondary | ICD-10-CM | POA: Diagnosis not present

## 2017-06-15 DIAGNOSIS — R011 Cardiac murmur, unspecified: Secondary | ICD-10-CM | POA: Diagnosis not present

## 2017-06-15 DIAGNOSIS — Z23 Encounter for immunization: Secondary | ICD-10-CM

## 2017-06-15 DIAGNOSIS — K635 Polyp of colon: Secondary | ICD-10-CM

## 2017-06-15 DIAGNOSIS — E559 Vitamin D deficiency, unspecified: Secondary | ICD-10-CM

## 2017-06-15 DIAGNOSIS — N183 Chronic kidney disease, stage 3 (moderate): Secondary | ICD-10-CM

## 2017-06-15 NOTE — Telephone Encounter (Signed)
Left message for [facility] sent to [place].

## 2017-06-15 NOTE — Patient Instructions (Addendum)
Increase Lantus to 53 units  Try to pick up Januvia 100 mg daily in am  Blood sugar in am before food should be 90-130 2 hours after lunch it should be <180  If not call me back  Caution with smoothies they can have a lot of sugar especially Mangos, Grapes, Cherries, Pears, Watermelon, Bananas eat in moderation Less sugar strawberries, Avocados, Guavas, Raspberries, Canteloupe, Papayas.  Pick up vitamin D F/u in 2-3 months sooner if needed  Ask Melissa about cardiology referral to Dr. Rockey Situ placed 06/01/17     Hypoglycemia Hypoglycemia is when the sugar (glucose) level in the blood is too low. Symptoms of low blood sugar may include:  Feeling: ? Hungry. ? Worried or nervous (anxious). ? Sweaty and clammy. ? Confused. ? Dizzy. ? Sleepy. ? Sick to your stomach (nauseous).  Having: ? A fast heartbeat. ? A headache. ? A change in your vision. ? Jerky movements that you cannot control (seizure). ? Nightmares. ? Tingling or no feeling (numbness) around the mouth, lips, or tongue.  Having trouble with: ? Talking. ? Paying attention (concentrating). ? Moving (coordination). ? Sleeping.  Shaking.  Passing out (fainting).  Getting upset easily (irritability).  Low blood sugar can happen to people who have diabetes and people who do not have diabetes. Low blood sugar can happen quickly, and it can be an emergency. Treating Low Blood Sugar Low blood sugar is often treated by eating or drinking something sugary right away. If you can think clearly and swallow safely, follow the 15:15 rule:  Take 15 grams of a fast-acting carb (carbohydrate). Some fast-acting carbs are: ? 1 tube of glucose gel. ? 3 sugar tablets (glucose pills). ? 6-8 pieces of hard candy. ? 4 oz (120 mL) of fruit juice. ? 4 oz (120 mL) of regular (not diet) soda.  Check your blood sugar 15 minutes after you take the carb.  If your blood sugar is still at or below 70 mg/dL (3.9 mmol/L), take 15 grams of a  carb again.  If your blood sugar does not go above 70 mg/dL (3.9 mmol/L) after 3 tries, get help right away.  After your blood sugar goes back to normal, eat a meal or a snack within 1 hour.  Treating Very Low Blood Sugar If your blood sugar is at or below 54 mg/dL (3 mmol/L), you have very low blood sugar (severe hypoglycemia). This is an emergency. Do not wait to see if the symptoms will go away. Get medical help right away. Call your local emergency services (911 in the [place] Do not drive yourself to the hospital. If you have very low blood sugar and you cannot eat or drink, you may need a glucagon shot (injection). A family member or friend should learn how to check your blood sugar and how to give you a glucagon shot. Ask your doctor if you need to have a glucagon shot kit at home. Follow these instructions at home: General instructions  Avoid any diets that cause you to not eat enough food. Talk with your doctor before you start any new diet.  Take over-the-counter and prescription medicines only as told by your doctor.  Limit alcohol to no more than 1 drink per day for nonpregnant women and 2 drinks per day for men. One drink equals 12 oz of beer, 5 oz of wine, or 1 oz of hard liquor.  Keep all follow-up visits as told by your doctor. This is important. If You Have Diabetes:  Make sure you know the symptoms of low blood sugar.  Always keep a source of sugar with you, such as: ? Sugar. ? Sugar tablets. ? Glucose gel. ? Fruit juice. ? Regular soda (not diet soda). ? Milk. ? Hard candy. ? Honey.  Take your medicines as told.  Follow your exercise and meal plan. ? Eat on time. Do not skip meals. ? Follow your sick day plan when you cannot eat or drink normally. Make this plan ahead of time with your doctor.  Check your blood sugar as often as told by your doctor. Always check before and after exercise.  Share your diabetes care plan with: ? Your work or  school. ? People you live with.  Check your pee (urine) for ketones: ? When you are sick. ? As told by your doctor.  Carry a card or wear jewelry that says you have diabetes. If You Have Low Blood Sugar From Other Causes:   Check your blood sugar as often as told by your doctor.  Follow instructions from your doctor about what you cannot eat or drink. Contact a doctor if:  You have trouble keeping your blood sugar in your target range.  You have low blood sugar often. Get help right away if:  You still have symptoms after you eat or drink something sugary.  Your blood sugar is at or below 54 mg/dL (3 mmol/L).  You have jerky movements that you cannot control.  You pass out. These symptoms may be an emergency. Do not wait to see if the symptoms will go away. Get medical help right away. Call your local emergency services (911 in the [place] Do not drive yourself to the hospital. This information is not intended to replace advice given to you by your health care provider. Make sure you discuss any questions you have with your health care provider. Document Released: 08/31/2009 Document Revised: 11/12/2015 Document Reviewed: 07/10/2015 Elsevier Interactive Patient Education  [employer].

## 2017-06-15 NOTE — Telephone Encounter (Signed)
Copied from [place] [id number]. Topic: General - Other >> Jun 15, 2017  5:06 PM Neva Seat wrote: Pt is needing the following filled from visit Dr. Terese Door today:  Vitamin D Levy Sjogren   The Rx's were sent to [facility] and pt needs ALL future Rx's be filled at:  [id number] [address], [place] [address] [place] [address] Phone: [phone] Fax: [phone]

## 2017-06-16 ENCOUNTER — Telehealth: Payer: Self-pay | Admitting: Internal Medicine

## 2017-06-16 DIAGNOSIS — R011 Cardiac murmur, unspecified: Secondary | ICD-10-CM

## 2017-06-16 HISTORY — DX: Cardiac murmur, unspecified: R01.1

## 2017-06-16 MED ORDER — LANTUS SOLOSTAR 100 UNIT/ML ~~LOC~~ SOPN: 53.0000 [IU] | PEN_INJECTOR | Freq: Every day | SUBCUTANEOUS | 2 refills | Status: DC

## 2017-06-16 MED ORDER — SITAGLIPTIN PHOSPHATE 100 MG PO TABS: 100.0000 mg | ORAL_TABLET | Freq: Every day | ORAL | 5 refills | Status: DC

## 2017-06-16 MED ORDER — CHOLECALCIFEROL 1.25 MG (50000 UT) PO CAPS: 50000.0000 [IU] | ORAL_CAPSULE | ORAL | 1 refills | Status: DC

## 2017-06-16 NOTE — Telephone Encounter (Signed)
Ok to stop magnesium  Continue norvasc/amlodipine  Bring in all meds again to next visit   Thanks [place]

## 2017-06-16 NOTE — Telephone Encounter (Signed)
Please advise 

## 2017-06-16 NOTE — Telephone Encounter (Signed)
Patient is calling and stating she is on 2 medications that Mclean-Scocuzza has never prescribed her before since she is a new patient. Amlodipine and Magnesium-oxide. She states that she wants to speak with the nurse of Dr. Aundra Dubin to discuss if it is  okay for her to continue taking them but she says if it is then the name on the pill bottle needs to be changed to Dr. Aundra Dubin on it. Please advise and contact patient back.

## 2017-06-16 NOTE — Progress Notes (Signed)
Chief Complaint  Patient presents with  . Follow-up   2 week f/u with daughter  1. DM 2 A1C 9.4 cbgs this am after insulin 30 minutes was 302 just increased insulin to 53 units, c/o cost of Januvia but daughter will try to help get for the patient. She was not able to tolerate Metformin in the past 2/2 GI upset. She has One touch ultra supplies  2. disc'ed vit D def stop OTC vit D and Rx 50K weekly  3. HTN controlled today  4. Not hep B immune rec vaccine pt agreeable to get today  5. Called Dr. Valorie Roosevelt in [place] last colonoscopy 12/18/14 and f/u due October 13, 2017 but they will check again with dysplastic polyp     Review of Systems  Constitutional: Negative for weight loss.  HENT: Negative for hearing loss.   Respiratory: Negative for shortness of breath.   Cardiovascular: Negative for chest pain.  Gastrointestinal: Negative for abdominal pain.  Skin: Negative for rash.  Neurological: Negative for headaches.   Past Medical History:  Diagnosis Date  . 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 Carotid bruit    US carotid had 09/03/14 [facility] [place]   . Carpal tunnel syndrome   . Cervical radiculopathy    improved since surgery 10-14-14   . Chicken pox   . 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 Loann Quill [facility]   . Degenerative disk disease   . Diabetes ([place])   . DVT, lower extremity ([place])    left, 05/2013 on Xarelto x 1 month etiology unkown   . GERD (gastroesophageal reflux disease)   . HSV-2 infection   . Hyperlipidemia   . Hypertension   . Insomnia   . Intertrigo   . Lumbar radiculopathy    improved after steroid inj and PT  . Peripheral vascular disease ([place])   . Subdeltoid bursitis   . Vitamin D deficiency    Past Surgical History:  Procedure Laterality Date  . 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 Sheppard Evens  . COLON SURGERY    . TUBAL LIGATION     Family History  Problem Relation Age of Onset  .  Hypertension Mother   . Arthritis Mother   . Cancer Father        colon cancer dx'ed died age 33  . Hypertension Sister   . Arthritis Sister   . Cancer Sister        brain tumor h/o lung cancer smoker died October 14, 2015   . Early death Sister   . Breast cancer Maternal Grandmother   . Cancer Maternal Grandmother        breast dx older age   . Hypertension Maternal Grandmother   . Breast cancer Other        maternal neice  . Hypertension Daughter   . Hypertension Daughter    Social History   Socioeconomic History  . Marital status: Divorced    Spouse name: Not on file  . Number of children: Not on file  . Years of education: Not on file  . Highest education level: Not on file  Social Needs  . Financial resource strain: Not on file  . Food insecurity - worry: Not on file  . Food insecurity - inability: Not on file  . Transportation needs - medical: Not on file  . Transportation needs - non-medical: Not on file  Occupational History  . Not on file  Tobacco Use  . Smoking status: Former Smoker    Types: Cigarettes    Last attempt to quit: 12/18/2012    Years since quitting: 4.4  . Smokeless tobacco: Never Used  Substance and Sexual Activity  . Alcohol use: No  . Drug use: Yes    Frequency: 3.0 times per week  . Sexual activity: Not on file  Other Topics Concern  . Not on file  Social History Narrative   Divorced    2 daughters [name] comes to most appts and is Chief Executive Officer    Used to work for [employer] now [employer]    Former smoker age 56 to age 71/2012 quit for sure in 2014 max 1/2 ppd FH lung cancer sister also smoker    Current Meds  Medication Sig  . acetaminophen (TYLENOL) 650 MG CR tablet Take 650 mg by mouth.  Marland Kitchen amLODipine (NORVASC) 10 MG tablet amlodipine besylate 10 mg tabs  . aspirin EC 81 MG tablet   . Cholecalciferol 50000 units capsule Take 1 capsule (50,000 Units total) by mouth once a week.  . clotrimazole (LOTRIMIN) 1 % cream   . Cobalamine  Combinations (B-12) [id number] MCG SUBL Place under the tongue.  . cyclobenzaprine (FLEXERIL) 5 MG tablet cyclobenzaprine 5 mg tablet  Take 1 tablet every day by oral route after meals for 30 days.  Marland Kitchen EPIPEN 2-PAK 0.3 MG/0.3ML SOAJ injection   . glucose blood test strip onetouch     tes ultra bl  . guaiFENesin (MUCINEX) 600 MG 12 hr tablet Take by mouth 2 (two) times daily.  . hydrocortisone 2.5 % cream Apply topically.  Marland Kitchen LANTUS SOLOSTAR 100 UNIT/ML Solostar Pen Inject 53 Units into the skin daily after breakfast.  . magnesium oxide (MAG-OX) 400 MG tablet Take 400 mg by mouth.  . nystatin cream (MYCOSTATIN) Apply 1,000,000 Units topically as needed.  Glory Rosebush DELICA LANCETS FINE MISC by Does not apply route.  . Powders (GOLD BOND BABY POWDER EX) Apply topically as needed.  . pravastatin (PRAVACHOL) 40 MG tablet Take 40 mg by mouth.  . sitaGLIPtin (JANUVIA) 100 MG tablet Take 1 tablet (100 mg total) by mouth daily.  . Tdap (BOOSTRIX) 5-2.5-18.5 LF-MCG/0.5 injection Boostrix Tdap 2.5 Lf unit-8 mcg-5 Lf/0.5 mL intramuscular suspension  . triamcinolone cream (KENALOG) 0.1 % triamcinolone acetonide 0.1 % topical cream  . Trolamine Salicylate (ASPERCREME EX) Apply topically.  . valACYclovir (VALTREX) 500 MG tablet 500 mg. Bid x 3 days with outbreak 24 hours before.  . [DISCONTINUED] Cholecalciferol (VITAMIN D3) 2000 units capsule Take by mouth.  . [DISCONTINUED] cyanocobalamin 1000 MCG tablet Take 1,000 mcg by mouth daily.  . [DISCONTINUED] docusate sodium (COLACE) 100 MG capsule Take 100 mg by mouth.   Current Facility-Administered Medications for the 06/15/17 encounter (Office Visit) with McLean-Scocuzza, Nino Glow, MD  Medication  . betamethasone acetate-betamethasone sodium phosphate (CELESTONE) injection 3 mg  . betamethasone acetate-betamethasone sodium phosphate (CELESTONE) injection 3 mg  . betamethasone acetate-betamethasone sodium phosphate (CELESTONE) injection 3 mg   Allergies   Allergen Reactions  . Lisinopril Anaphylaxis and Swelling    Tongue swelling Tongue swelling Tongue swelling Tongue swelling Tongue swelling Tongue swelling Tongue swelling Tongue swelling Tongue swelling Tongue swelling Tongue swelling Tongue swelling  . Sulfamethoxazole-Trimethoprim Other (See Comments)    Other reaction(s): Other (See Comments)  . Metformin And Related     GI sx's   . Penicillins   . Tramadol Nausea Only and Nausea And Vomiting   Recent Results (from  the past 2160 hour(s))  POCT Glucose (CBG)     Status: Abnormal   Collection Time: 06/01/17  2:51 PM  Result Value Ref Range   POC Glucose 306 (A) 70 - 99 mg/dl  Comprehensive metabolic panel     Status: Abnormal   Collection Time: 06/09/17  8:54 AM  Result Value Ref Range   Sodium 139 135 - 145 mEq/L   Potassium 4.3 3.5 - 5.1 mEq/L   Chloride 103 96 - 112 mEq/L   CO2 28 19 - 32 mEq/L   Glucose, Bld 139 (H) 70 - 99 mg/dL   BUN 16 6 - 23 mg/dL   Creatinine, Ser 1.07 0.40 - 1.20 mg/dL   Total Bilirubin 0.6 0.2 - 1.2 mg/dL   Alkaline Phosphatase 129 (H) 39 - 117 U/L   AST 21 0 - 37 U/L   ALT 17 0 - 35 U/L   Total Protein 7.7 6.0 - 8.3 g/dL   Albumin 4.1 3.5 - 5.2 g/dL   Calcium 9.5 8.4 - 10.5 mg/dL   GFR 64.86 >60.00 mL/min  CBC with Differential/Platelet     Status: None   Collection Time: 06/09/17  8:54 AM  Result Value Ref Range   WBC 7.1 4.0 - 10.5 K/uL   RBC 4.54 3.87 - 5.11 Mil/uL   Hemoglobin 12.5 12.0 - 15.0 g/dL   HCT 38.8 36.0 - 46.0 %   MCV 85.6 78.0 - 100.0 fl   MCHC 32.1 30.0 - 36.0 g/dL   RDW 14.5 11.5 - 15.5 %   Platelets 179.0 150.0 - 400.0 K/uL   Neutrophils Relative % 51.3 43.0 - 77.0 %   Lymphocytes Relative 40.8 12.0 - 46.0 %   Monocytes Relative 6.0 3.0 - 12.0 %   Eosinophils Relative 1.5 0.0 - 5.0 %   Basophils Relative 0.4 0.0 - 3.0 %   Neutro Abs 3.6 1.4 - 7.7 K/uL   Lymphs Abs 2.9 0.7 - 4.0 K/uL   Monocytes Absolute 0.4 0.1 - 1.0 K/uL   Eosinophils Absolute 0.1 0.0  - 0.7 K/uL   Basophils Absolute 0.0 0.0 - 0.1 K/uL  Lipid panel     Status: None   Collection Time: 06/09/17  8:54 AM  Result Value Ref Range   Cholesterol 163 0 - 200 mg/dL    Comment: ATP III Classification       Desirable:  < 200 mg/dL               Borderline High:  200 - 239 mg/dL          High:  > = 240 mg/dL   Triglycerides 93.0 0.0 - 149.0 mg/dL    Comment: Normal:  <150 mg/dLBorderline High:  150 - 199 mg/dL   HDL 61.90 >39.00 mg/dL   VLDL 18.6 0.0 - 40.0 mg/dL   LDL Cholesterol 82 0 - 99 mg/dL   Total CHOL/HDL Ratio 3     Comment:                Men          Women1/2 Average Risk     3.4          3.3Average Risk          5.0          4.42X Average Risk          9.6          7.13X Average Risk  15.0          11.0                       NonHDL 100.87     Comment: NOTE:  Non-HDL goal should be 30 mg/dL higher than patient's LDL goal (i.e. LDL goal of < 70 mg/dL, would have non-HDL goal of < 100 mg/dL)  Hemoglobin A1c     Status: Abnormal   Collection Time: 06/09/17  8:54 AM  Result Value Ref Range   Hgb A1c MFr Bld 9.4 (H) 4.6 - 6.5 %    Comment: Glycemic Control Guidelines for People with Diabetes:Non Diabetic:  <6%Goal of Therapy: <7%Additional Action Suggested:  >8%   T4, free     Status: None   Collection Time: 06/09/17  8:54 AM  Result Value Ref Range   Free T4 1.08 0.60 - 1.60 ng/dL    Comment: Specimens from patients who are undergoing biotin therapy and /or ingesting biotin supplements may contain high levels of biotin.  The higher biotin concentration in these specimens interferes with this Free T4 assay.  Specimens that contain high levels  of biotin may cause false high results for this Free T4 assay.  Please interpret results in light of the total clinical presentation of the patient.    TSH     Status: None   Collection Time: 06/09/17  8:54 AM  Result Value Ref Range   TSH 2.54 0.35 - 4.50 uIU/mL  Vitamin D (25 hydroxy)     Status: Abnormal   Collection  Time: 06/09/17  8:54 AM  Result Value Ref Range   VITD 17.45 (L) 30.00 - 100.00 ng/mL  Vitamin B12     Status: None   Collection Time: 06/09/17  8:54 AM  Result Value Ref Range   Vitamin B-12 509 211 - 911 pg/mL  Magnesium     Status: None   Collection Time: 06/09/17  8:54 AM  Result Value Ref Range   Magnesium 1.8 1.5 - 2.5 mg/dL  Hepatitis B surface antigen     Status: None   Collection Time: 06/09/17  8:54 AM  Result Value Ref Range   Hepatitis B Surface Ag NON-REACTIVE NON-REACTI  Hepatitis B surface antibody     Status: Abnormal   Collection Time: 06/09/17  8:54 AM  Result Value Ref Range   Hepatitis B-Post <5 (L) > OR = 10 mIU/mL    Comment: . Patient does not have immunity to hepatitis B virus. . For additional information, please refer to http://education.questdiagn[facility].com/faq/FAQ105 (This link is being provided for informational/ educational purposes only).   Hepatitis B core antibody, total     Status: None   Collection Time: 06/09/17  8:54 AM  Result Value Ref Range   Hep B Core Total Ab NON-REACTIVE NON-REACTI   Objective  Body mass index is 31.13 kg/m. Wt Readings from Last 3 Encounters:  06/15/17 170 lb 3.2 oz (77.2 kg)  06/01/17 171 lb 4 oz (77.7 kg)  10/03/16 163 lb (73.9 kg)   Temp Readings from Last 3 Encounters:  06/15/17 98.6 F (37 C) (Oral)  06/01/17 98 F (36.7 C) (Oral)  10/03/16 99.3 F (37.4 C) (Oral)   BP Readings from Last 3 Encounters:  06/15/17 120/62  06/01/17 128/70  10/03/16 (!) 142/72   Pulse Readings from Last 3 Encounters:  06/15/17 79  06/01/17 89  10/03/16 79   O2 sat 98% room air   Physical Exam  Constitutional: She  is oriented to person, place, and time and well-developed, well-nourished, and in no distress. Vital signs are normal.  HENT:  Head: Normocephalic and atraumatic.  Mouth/Throat: Oropharynx is clear and moist and mucous membranes are normal.  Eyes: Conjunctivae are normal. Pupils are equal, round,  and reactive to light.  Cardiovascular: Normal rate and regular rhythm.  Murmur heard. Pulmonary/Chest: Effort normal and breath sounds normal.  Neurological: She is alert and oriented to person, place, and time. Gait normal. Gait normal.  Skin: Skin is warm and dry.  Psychiatric: Mood, memory, affect and judgment normal.  Nursing note and vitals reviewed.   Assessment   1. DM 2 A1C 9.4  2. HTN/HLD  3. Vit D def  4. HM  5. Heart murmur  Plan  1.  Increase lantus to 53 units  Add januvia given coupon card  Reviewed sx's of hypoglycemia and fruits with high and low sugar pt likes to drink smoothies   2. Cont meds  3. Vit D3 50K IU weekly [facility]   4. [facility] 03[facility]2017 confirmed  Given 1/3 hep B vaccines today  pna 23 vx had 11/27/08  prevnar will need in future [facility] record  Will disc shingrix in the future  ? tdap last dose   [facility].   Called GI Dr. Darien Ramus [phone]  he is out of office until 06/21/17 to disc if colonoscopy due to h/o abnormal is really due 12/2017 or sooner.    Consider CT chest with chronic smoking hx quit 2014   Out of age window pap  mammo 02/14/17 neg  DEXA 10/30/13 osteopenia will rec vit D and calcium 600 mg bid     5. Pending cardiology referral Dr. Dian Queen [facility] to review meds/vaccines and [facility] as well they do not have many medications in the system for her and only vaccine flu 03/2017    Will have pt bring in meds to next visit again to review   Provider: Dr. Olivia Mackie McLean-Scocuzza-[facility] Medicine

## 2017-06-16 NOTE — Telephone Encounter (Signed)
Amlodipine 1 qd previously rx d Dr Allyson Sabal oxide 400 mg 1 qd  ? Continue medications , still has refills under Dr Valentine's name, magnesium lab was normal

## 2017-06-19 NOTE — Telephone Encounter (Signed)
Patient advised of below on 06/16/17

## 2017-07-18 ENCOUNTER — Telehealth: Payer: Self-pay | Admitting: Internal Medicine

## 2017-07-18 DIAGNOSIS — Z794 Long term (current) use of insulin: Principal | ICD-10-CM

## 2017-07-18 DIAGNOSIS — E1101 Type 2 diabetes mellitus with hyperosmolarity with coma: Secondary | ICD-10-CM

## 2017-07-18 NOTE — Telephone Encounter (Signed)
Copied from [place] [id number]. Topic: Quick Communication - Rx Refill/Question >> Jul 18, 2017  4:33 PM Bea Graff, NT wrote: Medication: One Touch Ultra Test Strips   Has the patient contacted their pharmacy? Yes.     (Agent: If no, request that the patient contact the pharmacy for the refill.)   Preferred Pharmacy (with phone number or street name[facility] [address]: Please be advised that RX refills may take up to 3 business days. We ask that you follow-up with your pharmacy.

## 2017-07-18 NOTE — Telephone Encounter (Signed)
Requesting One Touch Ultra Test Strips, last OV 05/2017, no provider on original order.

## 2017-07-19 MED ORDER — GLUCOSE BLOOD VI STRP: ORAL_STRIP | 3 refills | Status: DC

## 2017-07-19 NOTE — Telephone Encounter (Signed)
Please do put frequency E11.9  If frequency unknown call pt and ask   Thanks Ravenel

## 2017-07-19 NOTE — Telephone Encounter (Signed)
It it ok to add/order strips for patient?

## 2017-07-19 NOTE — Addendum Note (Signed)
Addended by: Elpidio Galea T on: 07/19/2017 01:54 PM   Modules accepted: Orders

## 2017-07-22 DIAGNOSIS — R0789 Other chest pain: Secondary | ICD-10-CM | POA: Insufficient documentation

## 2017-07-22 NOTE — Progress Notes (Signed)
Cardiology Office Note  Date:  07/25/2017   ID:  [name], [name] 1946-02-17, MRN [id number]  PCP:  McLean-Scocuzza, Nino Glow, MD   Chief Complaint  Patient presents with  . New Patient (Initial Visit)    per T. McLean-Scocuzza for dyspnea and cardiac murmur. Meds reviewed verbally with patient.     HPI:  [name] is a 72 year old woman with past medical history of Diabetes type 2 hemoglobin A1c 9.4,  previously 11.5 Chest pain,  shortness of breath Leg edema GERD, esophageal spasm Carotid bruit, ultrasound March 2016 DVT left December 2014 PAD Former smoker age 33 quit 4 years ago, 1/2 pack/day, family history lung cancer ACE inhibitor allergy/angioedema Who presents by referral from Dr. 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 Leg pain, ankle pain, No regular exercise Weight has been trending upwards Daughter who presents with her today concerned about heart disease Sometimes some chest tightness when she overexerts herself  Total cholesterol 163 LDL 82 TSH 2.5 Vitamin D 17, treated   Difficulty with her diabetes, unable to exercise, some dietary indiscretion Long history of smoking  EKG personally reviewed by myself on todays visit  Shows normal sinus rhythm rate 74 bpm no significant ST-T wave changes  PMH:   has a past medical history of 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  PSH:    Past Surgical History:  Procedure Laterality Date  . BACK SURGERY     cervical laminoplasty C3-C7 Rex  Dr. Sheppard Evens  . COLON SURGERY    . TUBAL LIGATION      Current Outpatient Medications  Medication Sig Dispense Refill  . acetaminophen (TYLENOL) 650 MG CR tablet Take 650 mg by mouth.    Marland Kitchen amLODipine (NORVASC) 10 MG tablet amlodipine besylate 10 mg tabs    . aspirin EC 81 MG tablet     . Cholecalciferol 50000 units capsule Take 1 capsule (50,000 Units total) by mouth once a week. 13 capsule 1  . clotrimazole (LOTRIMIN) 1 % cream     . Cobalamine Combinations (B-12) [phone] MCG SUBL Place under the tongue.    . cyclobenzaprine (FLEXERIL) 5 MG tablet cyclobenzaprine 5 mg tablet  Take 1 tablet every day by oral route after meals for 30 days.    Marland Kitchen EPIPEN 2-PAK 0.3 MG/0.3ML SOAJ injection   0  . glucose blood test strip onetouch     tes ultra bl    . glucose blood test strip Use 4 times daily before meals. 100 each 3  . hydrocortisone 2.5 % cream Apply topically.    Marland Kitchen LANTUS SOLOSTAR 100 UNIT/ML Solostar Pen Inject 53 Units into the skin daily after breakfast. 15 pen 2  . nystatin cream (MYCOSTATIN) Apply 1,000,000 Units topically as needed.    Glory Rosebush DELICA LANCETS FINE MISC by Does not apply route.    . Powders (GOLD BOND BABY POWDER EX) Apply topically as needed.    . pravastatin (PRAVACHOL) 40 MG tablet Take 40 mg by mouth.    . sitaGLIPtin (JANUVIA) 100 MG tablet Take 1 tablet (100 mg total) by mouth daily. 30 tablet 5  . Tdap ([place]) 5-2.5-18.5  LF-MCG/0.5 injection Boostrix Tdap 2.5 Lf unit-8 mcg-5 Lf/0.5 mL intramuscular suspension    . valACYclovir (VALTREX) 500 MG tablet 500 mg. Bid x 3 days with outbreak 24 hours before.  0   No current facility-administered medications for this visit.      Allergies:   Lisinopril; Sulfamethoxazole-trimethoprim; Metformin and related; Penicillins; and Tramadol   Social History:  The patient  reports that she quit smoking about 4 years ago. Her smoking use included cigarettes. she has never used smokeless tobacco. She reports that she  uses drugs. Frequency: 3.00 times per week. She reports that she does not drink alcohol.   Family History:   family history includes Arthritis in her mother and sister; Breast cancer in her maternal grandmother and other; Cancer in her father, maternal grandmother, and sister; Early death in her sister; Hypertension in her daughter, daughter, maternal grandmother, mother, and sister.    Review of Systems: Review of Systems  Constitutional: Negative.   Respiratory: Positive for shortness of breath.   Cardiovascular: Positive for chest pain.  Gastrointestinal: Negative.   Musculoskeletal: Positive for back pain and joint pain.  Neurological: Negative.   Psychiatric/Behavioral: Negative.   All other systems reviewed and are negative.    PHYSICAL EXAM: VS:  BP (!) 156/76 (BP Location: Right Arm, Patient Position: Sitting, Cuff Size: Normal)   Pulse 74   Ht 5\' 1"  (1.549 m)   Wt 168 lb (76.2 kg)   BMI 31.74 kg/m  , BMI Body mass index is 31.74 kg/m. GEN: Well nourished, well developed, in no acute distress  HEENT: normal  Neck: no JVD, carotid bruits, or masses Cardiac: RRR; 1/6 SEM RSB,  rubs, or gallops,no edema  Respiratory:  clear to auscultation bilaterally, normal work of breathing GI: soft, nontender, nondistended, + BS MS: no deformity or atrophy  Skin: warm and dry, no rash Neuro:  Strength and sensation are intact Psych: euthymic mood, full affect    Recent Labs: 06/09/2017: ALT 17; BUN 16; Creatinine, Ser 1.07; Hemoglobin 12.5; Magnesium 1.8; Platelets 179.0; Potassium 4.3; Sodium 139; TSH 2.54    Lipid Panel Lab Results  Component Value Date   CHOL 163 06/09/2017   HDL 61.90 06/09/2017   LDLCALC 82 06/09/2017   TRIG 93.0 06/09/2017      Wt Readings from Last 3 Encounters:  07/25/17 168 lb (76.2 kg)  06/15/17 170 lb 3.2 oz (77.2 kg)  06/01/17 171 lb 4 oz (77.7 kg)       ASSESSMENT AND PLAN:  Heart murmur - Plan: EKG 12-Lead Low grade 1/6 systolic  ejection murmur heard right sternal border 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 No changes made to the medications.  Type 2 diabetes mellitus with stage 3 chronic kidney disease, unspecified whether long term insulin use ([place]) Long discussion concerning diet, need to follow strict low carbohydrate selection Need to increase her exercise for weight loss  Disposition:   F/U as needed We will call her with the results of her stress test   Total encounter time more than 45 minutes  Greater than  50% was spent in counseling and coordination of care with the patient    Orders Placed This Encounter  Procedures  . EKG 12-Lead     Signed, Esmond Plants, M.D., Ph.D. 07/25/2017  [place], [place]

## 2017-07-25 ENCOUNTER — Encounter: Payer: Self-pay | Admitting: Cardiovascular Disease

## 2017-07-25 ENCOUNTER — Ambulatory Visit: Payer: Medicare Other | Admitting: Cardiovascular Disease

## 2017-07-25 VITALS — BP 156/76 | HR 74 | Ht 61.0 in | Wt 168.0 lb

## 2017-07-25 DIAGNOSIS — E782 Mixed hyperlipidemia: Secondary | ICD-10-CM | POA: Diagnosis not present

## 2017-07-25 DIAGNOSIS — R011 Cardiac murmur, unspecified: Secondary | ICD-10-CM | POA: Diagnosis not present

## 2017-07-25 DIAGNOSIS — I739 Peripheral vascular disease, unspecified: Secondary | ICD-10-CM | POA: Diagnosis not present

## 2017-07-25 DIAGNOSIS — N183 Chronic kidney disease, stage 3 (moderate): Secondary | ICD-10-CM

## 2017-07-25 DIAGNOSIS — R0602 Shortness of breath: Secondary | ICD-10-CM

## 2017-07-25 DIAGNOSIS — R0789 Other chest pain: Secondary | ICD-10-CM

## 2017-07-25 DIAGNOSIS — E1122 Type 2 diabetes mellitus with diabetic chronic kidney disease: Secondary | ICD-10-CM | POA: Diagnosis not present

## 2017-07-25 DIAGNOSIS — R0989 Other specified symptoms and signs involving the circulatory and respiratory systems: Secondary | ICD-10-CM | POA: Diagnosis not present

## 2017-07-25 DIAGNOSIS — I10 Essential (primary) hypertension: Secondary | ICD-10-CM | POA: Diagnosis not present

## 2017-07-25 NOTE — Patient Instructions (Addendum)
Medication Instructions:   No medication changes made  Labwork:  No new labs needed  Testing/Procedures:  We will order a lexiscan stress test for shortness of breath, chest tightness Please hold amlodipine night before the stress test No food the morning pf the stress test No caffeine 24 hours before the test [facility]  Your caregiver has ordered a Stress Test with nuclear imaging. The purpose of this test is to evaluate the blood supply to your heart muscle. This procedure is referred to as a "Non-Invasive Stress Test." This is because other than having an IV started in your vein, nothing is inserted or "invades" your body. Cardiac stress tests are done to find areas of poor blood flow to the heart by determining the extent of coronary artery disease (CAD). Some patients exercise on a treadmill, which naturally increases the blood flow to your heart, while others who are  unable to walk on a treadmill due to physical limitations have a pharmacologic/chemical stress agent called Lexiscan . This medicine will mimic walking on a treadmill by temporarily increasing your coronary blood flow.   Please note: these test may take anywhere between 2-4 hours to complete  PLEASE REPORT TO [facility] MEDICAL MALL ENTRANCE  THE VOLUNTEERS AT THE FIRST DESK WILL DIRECT YOU WHERE TO GO  Date of Procedure:___Friday February 15th_____  Arrival Time for Procedure:____07:45AM____  Instructions regarding medication:   _X___ : Hold diabetes medication morning of procedure  __X__:  Hold amlodipine the night before procedure and morning of procedure    PLEASE NOTIFY THE OFFICE AT LEAST 24 HOURS IN ADVANCE IF YOU ARE UNABLE TO KEEP YOUR APPOINTMENT.  [phone] AND  PLEASE NOTIFY NUCLEAR MEDICINE AT [facility] AT LEAST 24 HOURS IN ADVANCE IF YOU ARE UNABLE TO KEEP YOUR APPOINTMENT. [phone]  How to prepare for your Myoview test:  1. Do not eat or drink after midnight 2. No caffeine for 24 hours prior  to test 3. No smoking 24 hours prior to test. 4. Your medication may be taken with water.  If your doctor stopped a medication because of this test, do not take that medication. 5. Ladies, please do not wear dresses.  Skirts or pants are appropriate. Please wear a short sleeve shirt. 6. No perfume, cologne or lotion. 7. Wear comfortable walking shoes. No heels!    Follow-Up: It was a pleasure seeing you in the office today. Please call us if you have new issues that need to be addressed before your next appt.  [phone]  Your physician wants you to follow-up in:  As needed  If you need a refill on your cardiac medications before your next appointment, please call your pharmacy.

## 2017-08-01 ENCOUNTER — Ambulatory Visit: Payer: Medicare Other | Admitting: Podiatry

## 2017-08-01 DIAGNOSIS — M659 Synovitis and tenosynovitis, unspecified: Secondary | ICD-10-CM | POA: Diagnosis not present

## 2017-08-01 DIAGNOSIS — M65971 Unspecified synovitis and tenosynovitis, right ankle and foot: Secondary | ICD-10-CM

## 2017-08-01 DIAGNOSIS — E0842 Diabetes mellitus due to underlying condition with diabetic polyneuropathy: Secondary | ICD-10-CM

## 2017-08-01 DIAGNOSIS — M79676 Pain in unspecified toe(s): Secondary | ICD-10-CM

## 2017-08-01 DIAGNOSIS — B351 Tinea unguium: Secondary | ICD-10-CM

## 2017-08-01 DIAGNOSIS — M65972 Unspecified synovitis and tenosynovitis, left ankle and foot: Secondary | ICD-10-CM

## 2017-08-02 NOTE — Progress Notes (Signed)
   SUBJECTIVE Patient with a history of diabetes mellitus presents to office today complaining of elongated, thickened nails that cause pain while ambulating in shoes. Patient is unable to trim their own nails.  Patient is also here for follow up evaluation of left ankle pain. She now reports pain to the medial right ankle as well that began 2 months ago. She reports associated swelling and states the symptoms are similar to the left ankle. She states the pain sometimes radiates up the RLE. She reports significant relief after receiving the injection in the left ankle and is requesting another one. There are no modifying factors noted. She is here for further evaluation and treatment.    Past Medical History:  Diagnosis Date  . Blood clotting disorder ([place])   . Carotid bruit    US carotid had 09/03/14 [facility] [place]   . Carpal tunnel syndrome   . Cervical radiculopathy    improved since surgery 2016   . Chicken pox   . 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 Loann Quill [facility]   . Degenerative disk disease   . Diabetes ([place])   . DVT, lower extremity ([place])    left, 05/2013 on Xarelto x 1 month etiology unkown   . GERD (gastroesophageal reflux disease)   . HSV-2 infection   . Hyperlipidemia   . Hypertension   . Insomnia   . Intertrigo   . Lumbar radiculopathy    improved after steroid inj and PT  . Peripheral vascular disease ([place])   . Subdeltoid bursitis   . Vitamin D deficiency      OBJECTIVE General Patient is awake, alert, and oriented x 3 and in no acute distress. Derm Skin is dry and supple bilateral. Negative open lesions or macerations. Remaining integument unremarkable. Nails are tender, long, thickened and dystrophic with subungual debris, consistent with onychomycosis, 1-5 bilateral. No signs of infection noted. Vasc  DP and PT pedal pulses palpable bilaterally. Temperature gradient within normal limits.  Neuro Epicritic  and protective threshold sensation diminished bilaterally.  Musculoskeletal Exam Muscular strength within normal limits. Pain on palpation noted to the anterior medial and lateral aspects the patient's bilateral ankle joints.  ASSESSMENT 1. Diabetes Mellitus w/ peripheral neuropathy 2. Onychomycosis of nail due to dermatophyte bilateral 3. Bilateral ankle joint synovitis/DJD  PLAN OF CARE 1. Patient evaluated today. 2. Instructed to maintain good pedal hygiene and foot care. Stressed importance of controlling blood sugar.  3. Mechanical debridement of nails 1-5 bilaterally performed using a nail nipper. Filed with dremel without incident.  4. Injection of 0.5 mLs Celestone Soluspan injected into the bilateral ankle joints. 5. Return to clinic in 3 months.    Edrick Kins, DPM [facility]  Dr. Edrick Kins, [address]                                        [place],  [address]                Office [phone]  Fax [phone]

## 2017-08-04 ENCOUNTER — Ambulatory Visit
Admission: RE | Admit: 2017-08-04 | Discharge: 2017-08-04 | Disposition: A | Discharge status: Home / Self Care | Payer: Medicare Other | Source: Ambulatory Visit | Attending: Cardiovascular Disease | Admitting: Cardiovascular Disease

## 2017-08-04 DIAGNOSIS — R0602 Shortness of breath: Secondary | ICD-10-CM | POA: Diagnosis present

## 2017-08-04 LAB — NM MYOCAR MULTI W/SPECT W/WALL MOTION / EF
CHL CUP NUCLEAR SSS: 9
CSEPEDS: 0 s
CSEPHR: 55 %
CSEPPHR: 83 {beats}/min
Estimated workload: 1 METS
Exercise duration (min): 0 min
LV dias vol: 26 mL (ref 46–106)
LVSYSVOL: 6 mL
MPHR: 149 {beats}/min
Rest HR: 65 {beats}/min
SDS: 3
SRS: 8
TID: 1.05

## 2017-08-04 MED ORDER — TECHNETIUM TC 99M TETROFOSMIN IV KIT
13.6000 | PACK | Freq: Once | INTRAVENOUS | Status: AC | PRN
  Administered 2017-08-04: 13.6 via INTRAVENOUS

## 2017-08-04 MED ORDER — REGADENOSON 0.4 MG/5ML IV SOLN
0.4000 mg | Freq: Once | INTRAVENOUS | Status: AC
  Administered 2017-08-04: 0.4 mg via INTRAVENOUS

## 2017-08-04 MED ORDER — TECHNETIUM TC 99M TETROFOSMIN IV KIT
30.6600 | PACK | Freq: Once | INTRAVENOUS | Status: AC | PRN
  Administered 2017-08-04: 30.66 via INTRAVENOUS

## 2017-08-17 ENCOUNTER — Ambulatory Visit: Payer: Medicare Other | Admitting: Internal Medicine

## 2017-08-17 ENCOUNTER — Encounter: Payer: Self-pay | Admitting: Internal Medicine

## 2017-08-17 VITALS — BP 130/60 | HR 81 | Temp 98.7°F | Ht 61.0 in | Wt 170.0 lb

## 2017-08-17 DIAGNOSIS — E1165 Type 2 diabetes mellitus with hyperglycemia: Secondary | ICD-10-CM | POA: Diagnosis not present

## 2017-08-17 DIAGNOSIS — B009 Herpesviral infection, unspecified: Secondary | ICD-10-CM

## 2017-08-17 DIAGNOSIS — Z23 Encounter for immunization: Secondary | ICD-10-CM | POA: Diagnosis not present

## 2017-08-17 DIAGNOSIS — G8929 Other chronic pain: Secondary | ICD-10-CM | POA: Diagnosis not present

## 2017-08-17 DIAGNOSIS — M1712 Unilateral primary osteoarthritis, left knee: Secondary | ICD-10-CM

## 2017-08-17 DIAGNOSIS — R011 Cardiac murmur, unspecified: Secondary | ICD-10-CM | POA: Diagnosis not present

## 2017-08-17 DIAGNOSIS — M25512 Pain in left shoulder: Secondary | ICD-10-CM | POA: Diagnosis not present

## 2017-08-17 DIAGNOSIS — Z794 Long term (current) use of insulin: Secondary | ICD-10-CM

## 2017-08-17 DIAGNOSIS — C189 Malignant neoplasm of colon, unspecified: Secondary | ICD-10-CM | POA: Diagnosis not present

## 2017-08-17 MED ORDER — VALACYCLOVIR HCL 500 MG PO TABS: 500.0000 mg | ORAL_TABLET | Freq: Two times a day (BID) | ORAL | 2 refills | Status: DC

## 2017-08-17 NOTE — Progress Notes (Signed)
Pre visit review using our clinic review tool, if applicable. No additional management support is needed unless otherwise documented below in the visit note. 

## 2017-08-17 NOTE — Patient Instructions (Addendum)
sch labs 09/08/17  Follow up early 09/2017    Diabetes Mellitus and Nutrition When you have diabetes (diabetes mellitus), it is very important to have healthy eating habits because your blood sugar (glucose) levels are greatly affected by what you eat and drink. Eating healthy foods in the appropriate amounts, at about the same times every day, can help you:  Control your blood glucose.  Lower your risk of heart disease.  Improve your blood pressure.  Reach or maintain a healthy weight.  Every person with diabetes is different, and each person has different needs for a meal plan. Your health care provider may recommend that you work with a diet and nutrition specialist (dietitian) to make a meal plan that is best for you. Your meal plan may vary depending on factors such as:  The calories you need.  The medicines you take.  Your weight.  Your blood glucose, blood pressure, and cholesterol levels.  Your activity level.  Other health conditions you have, such as heart or kidney disease.  How do carbohydrates affect me? Carbohydrates affect your blood glucose level more than any other type of food. Eating carbohydrates naturally increases the amount of glucose in your blood. Carbohydrate counting is a method for keeping track of how many carbohydrates you eat. Counting carbohydrates is important to keep your blood glucose at a healthy level, especially if you use insulin or take certain oral diabetes medicines. It is important to know how many carbohydrates you can safely have in each meal. This is different for every person. Your dietitian can help you calculate how many carbohydrates you should have at each meal and for snack. Foods that contain carbohydrates include:  Bread, cereal, rice, pasta, and crackers.  Potatoes and corn.  Peas, beans, and lentils.  Milk and yogurt.  Fruit and juice.  Desserts, such as cakes, cookies, ice cream, and candy.  How does alcohol affect  me? Alcohol can cause a sudden decrease in blood glucose (hypoglycemia), especially if you use insulin or take certain oral diabetes medicines. Hypoglycemia can be a life-threatening condition. Symptoms of hypoglycemia (sleepiness, dizziness, and confusion) are similar to symptoms of having too much alcohol. If your health care provider says that alcohol is safe for you, follow these guidelines:  Limit alcohol intake to no more than 1 drink per day for nonpregnant women and 2 drinks per day for men. One drink equals 12 oz of beer, 5 oz of wine, or 1 oz of hard liquor.  Do not drink on an empty stomach.  Keep yourself hydrated with water, diet soda, or unsweetened iced tea.  Keep in mind that regular soda, juice, and other mixers may contain a lot of sugar and must be counted as carbohydrates.  What are tips for following this plan? Reading food labels  Start by checking the serving size on the label. The amount of calories, carbohydrates, fats, and other nutrients listed on the label are based on one serving of the food. Many foods contain more than one serving per package.  Check the total grams (g) of carbohydrates in one serving. You can calculate the number of servings of carbohydrates in one serving by dividing the total carbohydrates by 15. For example, if a food has 30 g of total carbohydrates, it would be equal to 2 servings of carbohydrates.  Check the number of grams (g) of saturated and trans fats in one serving. Choose foods that have low or no amount of these fats.  Check the  number of milligrams (mg) of sodium in one serving. Most people should limit total sodium intake to less than 2,300 mg per day.  Always check the nutrition information of foods labeled as "low-fat" or "nonfat". These foods may be higher in added sugar or refined carbohydrates and should be avoided.  Talk to your dietitian to identify your daily goals for nutrients listed on the label. Shopping  Avoid  buying canned, premade, or processed foods. These foods tend to be high in fat, sodium, and added sugar.  Shop around the outside edge of the grocery store. This includes fresh fruits and vegetables, bulk grains, fresh meats, and fresh dairy. Cooking  Use low-heat cooking methods, such as baking, instead of high-heat cooking methods like deep frying.  Cook using healthy oils, such as olive, canola, or sunflower oil.  Avoid cooking with butter, cream, or high-fat meats. Meal planning  Eat meals and snacks regularly, preferably at the same times every day. Avoid going long periods of time without eating.  Eat foods high in fiber, such as fresh fruits, vegetables, beans, and whole grains. Talk to your dietitian about how many servings of carbohydrates you can eat at each meal.  Eat 4-6 ounces of lean protein each day, such as lean meat, chicken, fish, eggs, or tofu. 1 ounce is equal to 1 ounce of meat, chicken, or fish, 1 egg, or 1/4 cup of tofu.  Eat some foods each day that contain healthy fats, such as avocado, nuts, seeds, and fish. Lifestyle   Check your blood glucose regularly.  Exercise at least 30 minutes 5 or more days each week, or as told by your health care provider.  Take medicines as told by your health care provider.  Do not use any products that contain nicotine or tobacco, such as cigarettes and e-cigarettes. If you need help quitting, ask your health care provider.  Work with a Social worker or diabetes educator to identify strategies to manage stress and any emotional and social challenges. What are some questions to ask my health care provider?  Do I need to meet with a diabetes educator?  Do I need to meet with a dietitian?  What number can I call if I have questions?  When are the best times to check my blood glucose? Where to find more information:  American Diabetes Association: diabetes.org/food-and-fitness/food  Academy of Nutrition and Dietetics:  PokerClues.dk  [employer] of Diabetes and Digestive and Kidney Diseases (NIH): ContactWire.be Summary  A healthy meal plan will help you control your blood glucose and maintain a healthy lifestyle.  Working with a diet and nutrition specialist (dietitian) can help you make a meal plan that is best for you.  Keep in mind that carbohydrates and alcohol have immediate effects on your blood glucose levels. It is important to count carbohydrates and to use alcohol carefully. This information is not intended to replace advice given to you by your health care provider. Make sure you discuss any questions you have with your health care provider. Document Released: 03/03/2005 Document Revised: 07/11/2016 Document Reviewed: 07/11/2016 Elsevier Interactive Patient Education  2018 [employer].   Genital Herpes Genital herpes is a common sexually transmitted infection (STI) that is caused by a virus. The virus spreads from person to person through sexual contact. Infection can cause itching, blisters, and sores around the genitals or rectum. Symptoms may last several days and then go away This is called an outbreak. However, the virus remains in your body, so you may have  more outbreaks in the future. The time between outbreaks varies and can be months or years. Genital herpes affects men and women. It is particularly concerning for pregnant women because the virus can be passed to the baby during delivery and can cause serious problems. Genital herpes is also a concern for people who have a weak disease-fighting (immune) system. What are the causes? This condition is caused by the herpes simplex virus (HSV) type 1 or type 2. The virus may spread through:  Sexual contact with an infected person, including vaginal, anal, and oral sex.  Contact with fluid from a herpes  sore.  The skin. This means that you can get herpes from an infected partner even if he or she does not have a visible sore or does not know that he or she is infected.  What increases the risk? You are more likely to develop this condition if:  You have sex with many partners.  You do not use latex condoms during sex.  What are the signs or symptoms? Most people do not have symptoms (asymptomatic) or have mild symptoms that may be mistaken for other skin problems. Symptoms may include:  Small red bumps near the genitals, rectum, or mouth. These bumps turn into blisters and then turn into sores.  Flu-like symptoms, including: ? Fever. ? Body aches. ? Swollen lymph nodes. ? Headache.  Painful urination.  Pain and itching in the genital area or rectal area.  Vaginal discharge.  Tingling or shooting pain in the legs and buttocks.  Generally, symptoms are more severe and last longer during the first (primary) outbreak. Flu-like symptoms are also more common during the primary outbreak. How is this diagnosed? Genital herpes may be diagnosed based on:  A physical exam.  Your medical history.  Blood tests.  A test of a fluid sample (culture) from an open sore.  How is this treated? There is no cure for this condition, but treatment with antiviral medicines that are taken by mouth (orally) can do the following:  Speed up healing and relieve symptoms.  Help to reduce the spread of the virus to sexual partners.  Limit the chance of future outbreaks, or make future outbreaks shorter.  Lessen symptoms of future outbreaks.  Your health care provider may also recommend pain relief medicines, such as aspirin or ibuprofen. Follow these instructions at home: Sexual activity  Do not have sexual contact during active outbreaks.  Practice safe sex. Latex condoms and female condoms may help prevent the spread of the herpes virus. General instructions  Keep the affected areas  dry and clean.  Take over-the-counter and prescription medicines only as told by your health care provider.  Avoid rubbing or touching blisters and sores. If you do touch blisters or sores: ? Wash your hands thoroughly with soap and water. ? Do not touch your eyes afterward.  To help relieve pain or itching, you may take the following actions as directed by your health care provider: ? Apply a cold, wet cloth (cold compress) to affected areas 4-6 times a day. ? Apply a substance that protects your skin and reduces bleeding (astringent). ? Apply a gel that helps relieve pain around sores (lidocaine gel). ? Take a warm, shallow bath that cleans the genital area (sitz bath).  Keep all follow-up visits as told by your health care provider. This is important. How is this prevented?  Use condoms. Although anyone can get genital herpes during sexual contact, even with the use of a condom,  a condom can provide some protection.  Avoid having multiple sexual partners.  Talk with your sexual partner about any symptoms either of you may have. Also, talk with your partner about any history of STIs.  Get tested for STIs before you have sex. Ask your partner to do the same.  Do not have sexual contact if you have symptoms of genital herpes. Contact a health care provider if:  Your symptoms are not improving with medicine.  Your symptoms return.  You have new symptoms.  You have a fever.  You have abdominal pain.  You have redness, swelling, or pain in your eye.  You notice new sores on other parts of your body.  You are a woman and experience bleeding between menstrual periods.  You have had herpes and you become pregnant or plan to become pregnant. Summary  Genital herpes is a common sexually transmitted infection (STI) that is caused by the herpes simplex virus (HSV) type 1 or type 2.  These viruses are most often spread through sexual contact with an infected person.  You are  more likely to develop this condition if you have sex with many partners or you have unprotected sex.  Most people do not have symptoms (asymptomatic) or have mild symptoms that may be mistaken for other skin problems. Symptoms occur as outbreaks that may happen months or years apart.  There is no cure for this condition, but treatment with oral antiviral medicines can reduce symptoms, reduce the chance of spreading the virus to a partner, prevent future outbreaks, or shorten future outbreaks. This information is not intended to replace advice given to you by your health care provider. Make sure you discuss any questions you have with your health care provider. Document Released: 06/03/2000 Document Revised: 05/06/2016 Document Reviewed: 05/06/2016 Elsevier Interactive Patient Education  [employer].

## 2017-08-20 ENCOUNTER — Encounter: Payer: Self-pay | Admitting: Internal Medicine

## 2017-08-20 NOTE — Progress Notes (Addendum)
Chief Complaint  Patient presents with  . Follow-up   Follow up with daughter [name] 1. She wants 2nd hep B vaccine today  2. DM 2 cbgs in am fasting 129-150 and pm 300s daughter thinks she is diet noncompliant and 08/01/17 saw the foot MD and has steroid injections in her feet She has been doing 45 units of Lantus though per my last note she was supposed to be doing 53 units she is on Januvia 100 mg qd daughter would like referral to nutrition  3. 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 C/o pain left shoulder, left knee and b/l ankles and seeing podiatry rec disc ankle pain with them    Review of Systems  Constitutional: Negative for weight loss.  HENT: Negative for hearing loss.   Eyes:       No vision problems   Respiratory: Negative for shortness of breath.   Cardiovascular: Negative for chest pain.  Gastrointestinal: Negative for abdominal pain.  Musculoskeletal: Positive for joint pain.  Skin:       +HSV outbreak buttocks  Neurological: Negative for headaches.  Psychiatric/Behavioral: Negative for depression.   Past Medical History:  Diagnosis Date  . Blood clotting disorder ([place])   . Carotid bruit    US carotid had 09/03/14 [facility]   . Carpal tunnel syndrome   . Cervical radiculopathy    improved since surgery 2014-10-07   . Chicken pox   . 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 Loann Quill [facility]   . Degenerative disk disease   . Diabetes ([place])   . DVT, lower extremity ([place])    left, 05/2013 on Xarelto x 1 month etiology unkown   . GERD (gastroesophageal reflux disease)   . HSV-2 infection   . Hyperlipidemia   . Hypertension   . Insomnia   . Intertrigo   . Lumbar radiculopathy    improved after steroid inj and PT  . Peripheral vascular disease ([place])   . Subdeltoid bursitis   . Vitamin D deficiency    Past Surgical History:  Procedure Laterality Date  . BACK SURGERY      cervical laminoplasty C3-C7 Rex Dr. Sheppard Evens  . COLON SURGERY    . TUBAL LIGATION     Family History  Problem Relation Age of Onset  . Hypertension Mother   . Arthritis Mother   . Cancer Father        colon cancer dx'ed died age 7  . Hypertension Sister   . Arthritis Sister   . Cancer Sister        brain tumor h/o lung cancer smoker died 2015/10/07   . Early death Sister   . Breast cancer Maternal Grandmother   . Cancer Maternal Grandmother        breast dx older age   . Hypertension Maternal Grandmother   . Breast cancer Other        maternal neice  . Hypertension Daughter   . Hypertension Daughter    Social History   Socioeconomic History  . Marital status: Divorced    Spouse name: Not on file  . Number of children: Not on file  . Years of education: Not on file  . Highest education level: Not on file  Social Needs  . Financial resource strain: Not on file  . Food insecurity - worry: Not on file  . Food insecurity - inability: Not  on file  . Transportation needs - medical: Not on file  . Transportation needs - non-medical: Not on file  Occupational History  . Not on file  Tobacco Use  . Smoking status: Former Smoker    Types: Cigarettes    Last attempt to quit: 12/18/2012    Years since quitting: 4.6  . Smokeless tobacco: Never Used  Substance and Sexual Activity  . Alcohol use: No  . Drug use: Yes    Frequency: 3.0 times per week  . Sexual activity: Not on file  Other Topics Concern  . Not on file  Social History Narrative   Divorced    2 daughters [name] comes to most appts and is Chief Executive Officer    Used to work for [employer] now [employer]    Former smoker age 49 to age 72/2012 quit for sure in 2014 max 1/2 ppd FH lung cancer sister also smoker    Current Meds  Medication Sig  . acetaminophen (TYLENOL) 650 MG CR tablet Take 650 mg by mouth.  Marland Kitchen amLODipine (NORVASC) 10 MG tablet amlodipine besylate 10 mg tabs  . aspirin EC 81 MG tablet   .  Cholecalciferol 50000 units capsule Take 1 capsule (50,000 Units total) by mouth once a week.  . clotrimazole (LOTRIMIN) 1 % cream   . Cobalamine Combinations (B-12) [phone] MCG SUBL Place under the tongue.  Marland Kitchen EPIPEN 2-PAK 0.3 MG/0.3ML SOAJ injection   . glucose blood test strip Use 4 times daily before meals.  . hydrocortisone 2.5 % cream Apply topically.  Marland Kitchen LANTUS SOLOSTAR 100 UNIT/ML Solostar Pen Inject 53 Units into the skin daily after breakfast.  . nystatin cream (MYCOSTATIN) Apply 1,000,000 Units topically as needed.  Glory Rosebush DELICA LANCETS FINE MISC by Does not apply route.  . Powders (GOLD BOND BABY POWDER EX) Apply topically as needed.  . pravastatin (PRAVACHOL) 40 MG tablet Take 40 mg by mouth.  . sitaGLIPtin (JANUVIA) 100 MG tablet Take 1 tablet (100 mg total) by mouth daily.  . Tdap (BOOSTRIX) 5-2.5-18.5 LF-MCG/0.5 injection Boostrix Tdap 2.5 Lf unit-8 mcg-5 Lf/0.5 mL intramuscular suspension  . valACYclovir (VALTREX) 500 MG tablet Take 1 tablet (500 mg total) by mouth 2 (two) times daily. Bid x 3 days with outbreak 24 hours before.  . [DISCONTINUED] valACYclovir (VALTREX) 500 MG tablet 500 mg. Bid x 3 days with outbreak 24 hours before.   Allergies  Allergen Reactions  . Lisinopril Anaphylaxis and Swelling    Tongue swelling Tongue swelling Tongue swelling Tongue swelling Tongue swelling Tongue swelling Tongue swelling Tongue swelling Tongue swelling Tongue swelling Tongue swelling Tongue swelling  . Sulfamethoxazole-Trimethoprim Other (See Comments)    Other reaction(s): Other (See Comments)  . Metformin And Related     GI sx's   . Penicillins   . Tramadol Nausea Only and Nausea And Vomiting   Recent Results (from the past 2160 hour(s))  POCT Glucose (CBG)     Status: Abnormal   Collection Time: 06/01/17  2:51 PM  Result Value Ref Range   POC Glucose 306 (A) 70 - 99 mg/dl  Comprehensive metabolic panel     Status: Abnormal   Collection Time: 06/09/17   8:54 AM  Result Value Ref Range   Sodium 139 135 - 145 mEq/L   Potassium 4.3 3.5 - 5.1 mEq/L   Chloride 103 96 - 112 mEq/L   CO2 28 19 - 32 mEq/L   Glucose, Bld 139 (H) 70 - 99 mg/dL   BUN 16  6 - 23 mg/dL   Creatinine, Ser 1.07 0.40 - 1.20 mg/dL   Total Bilirubin 0.6 0.2 - 1.2 mg/dL   Alkaline Phosphatase 129 (H) 39 - 117 U/L   AST 21 0 - 37 U/L   ALT 17 0 - 35 U/L   Total Protein 7.7 6.0 - 8.3 g/dL   Albumin 4.1 3.5 - 5.2 g/dL   Calcium 9.5 8.4 - 10.5 mg/dL   GFR 64.86 >60.00 mL/min  CBC with Differential/Platelet     Status: None   Collection Time: 06/09/17  8:54 AM  Result Value Ref Range   WBC 7.1 4.0 - 10.5 K/uL   RBC 4.54 3.87 - 5.11 Mil/uL   Hemoglobin 12.5 12.0 - 15.0 g/dL   HCT 38.8 36.0 - 46.0 %   MCV 85.6 78.0 - 100.0 fl   MCHC 32.1 30.0 - 36.0 g/dL   RDW 14.5 11.5 - 15.5 %   Platelets 179.0 150.0 - 400.0 K/uL   Neutrophils Relative % 51.3 43.0 - 77.0 %   Lymphocytes Relative 40.8 12.0 - 46.0 %   Monocytes Relative 6.0 3.0 - 12.0 %   Eosinophils Relative 1.5 0.0 - 5.0 %   Basophils Relative 0.4 0.0 - 3.0 %   Neutro Abs 3.6 1.4 - 7.7 K/uL   Lymphs Abs 2.9 0.7 - 4.0 K/uL   Monocytes Absolute 0.4 0.1 - 1.0 K/uL   Eosinophils Absolute 0.1 0.0 - 0.7 K/uL   Basophils Absolute 0.0 0.0 - 0.1 K/uL  Lipid panel     Status: None   Collection Time: 06/09/17  8:54 AM  Result Value Ref Range   Cholesterol 163 0 - 200 mg/dL    Comment: ATP III Classification       Desirable:  < 200 mg/dL               Borderline High:  200 - 239 mg/dL          High:  > = 240 mg/dL   Triglycerides 93.0 0.0 - 149.0 mg/dL    Comment: Normal:  <150 mg/dLBorderline High:  150 - 199 mg/dL   HDL 61.90 >39.00 mg/dL   VLDL 18.6 0.0 - 40.0 mg/dL   LDL Cholesterol 82 0 - 99 mg/dL   Total CHOL/HDL Ratio 3     Comment:                Men          Women1/2 Average Risk     3.4          3.3Average Risk          5.0          4.42X Average Risk          9.6          7.13X Average Risk          15.0           11.0                       NonHDL 100.87     Comment: NOTE:  Non-HDL goal should be 30 mg/dL higher than patient's LDL goal (i.e. LDL goal of < 70 mg/dL, would have non-HDL goal of < 100 mg/dL)  Hemoglobin A1c     Status: Abnormal   Collection Time: 06/09/17  8:54 AM  Result Value Ref Range   Hgb A1c MFr Bld 9.4 (H) 4.6 - 6.5 %  Comment: Glycemic Control Guidelines for People with Diabetes:Non Diabetic:  <6%Goal of Therapy: <7%Additional Action Suggested:  >8%   T4, free     Status: None   Collection Time: 06/09/17  8:54 AM  Result Value Ref Range   Free T4 1.08 0.60 - 1.60 ng/dL    Comment: Specimens from patients who are undergoing biotin therapy and /or ingesting biotin supplements may contain high levels of biotin.  The higher biotin concentration in these specimens interferes with this Free T4 assay.  Specimens that contain high levels  of biotin may cause false high results for this Free T4 assay.  Please interpret results in light of the total clinical presentation of the patient.    TSH     Status: None   Collection Time: 06/09/17  8:54 AM  Result Value Ref Range   TSH 2.54 0.35 - 4.50 uIU/mL  Vitamin D (25 hydroxy)     Status: Abnormal   Collection Time: 06/09/17  8:54 AM  Result Value Ref Range   VITD 17.45 (L) 30.00 - 100.00 ng/mL  Vitamin B12     Status: None   Collection Time: 06/09/17  8:54 AM  Result Value Ref Range   Vitamin B-12 509 211 - 911 pg/mL  Magnesium     Status: None   Collection Time: 06/09/17  8:54 AM  Result Value Ref Range   Magnesium 1.8 1.5 - 2.5 mg/dL  Hepatitis B surface antigen     Status: None   Collection Time: 06/09/17  8:54 AM  Result Value Ref Range   Hepatitis B Surface Ag NON-REACTIVE NON-REACTI  Hepatitis B surface antibody     Status: Abnormal   Collection Time: 06/09/17  8:54 AM  Result Value Ref Range   Hepatitis B-Post <5 (L) > OR = 10 mIU/mL    Comment: . Patient does not have immunity to hepatitis B virus. . For additional  information, please refer to http://education.questdiagnostics.com/faq/FAQ105 (This link is being provided for informational/ educational purposes only).   Hepatitis B core antibody, total     Status: None   Collection Time: 06/09/17  8:54 AM  Result Value Ref Range   Hep B Core Total Ab NON-REACTIVE NON-REACTI  NM Myocar Multi W/Spect W/Wall Motion / EF     Status: None   Collection Time: 08/04/17 12:07 PM  Result Value Ref Range   Rest HR 65 bpm   Rest BP 143/70 mmHg   Exercise duration (sec) 0 sec   Percent HR 55 %   Exercise duration (min) 0 min   Estimated workload 1.0 METS   Peak HR 83 bpm   Peak BP 110/58 mmHg   MPHR 149 bpm   SSS 9    SRS 8    SDS 3    TID 1.05    LV sys vol 6 mL   LV dias vol 26 46 - 106 mL   Objective  Body mass index is 32.12 kg/m. Wt Readings from Last 3 Encounters:  08/17/17 170 lb (77.1 kg)  07/25/17 168 lb (76.2 kg)  06/15/17 170 lb 3.2 oz (77.2 kg)   Temp Readings from Last 3 Encounters:  08/17/17 98.7 F (37.1 C) (Oral)  06/15/17 98.6 F (37 C) (Oral)  06/01/17 98 F (36.7 C) (Oral)   BP Readings from Last 3 Encounters:  08/17/17 130/60  07/25/17 (!) 156/76  06/15/17 120/62   Pulse Readings from Last 3 Encounters:  08/17/17 81  07/25/17 74  06/15/17 79  O2 sat room air 95%  Physical Exam  Constitutional: She is oriented to person, place, and time and well-developed, well-nourished, and in no distress. Vital signs are normal.  HENT:  Head: Normocephalic and atraumatic.  Mouth/Throat: Oropharynx is clear and moist and mucous membranes are normal.  Eyes: Conjunctivae are normal. Pupils are equal, round, and reactive to light.  Cardiovascular: Normal rate and regular rhythm.  Murmur heard. Pulmonary/Chest: Effort normal and breath sounds normal.  Neurological: She is alert and oriented to person, place, and time. Gait normal. Gait normal.  Skin: Skin is warm and dry.     Left buttock gluteal fold HSV lesion today     Psychiatric: Mood, memory, affect and judgment normal.  Nursing note and vitals reviewed.   Assessment   1. DM 2 uncontrolled  2. HSV 2  3. Left shoulder, left knee, b/l ankle pain and swelling ddx arthritis 4. Cardiac murmur 5. H/o colon polyp  6. HM Plan  1.  lantus 53 units, check A1C 09/08/17 and UA and urine protein  Consider SSI-S qd to bid if A1C still not controlled 3/22 Need to do foot exam in future  Ask about eye exam in future  On statin, consider ARB in future need to assess fo rthis had anaphylaxis to ACEI  Check CMET, UA, urine protein, A1C 09/08/17   Referred to nutrition disc healthy diet choices today I think she needs more nutrition   2. Refill valtrex  3. Evaluate at f/u rec prn Tylenol for pain  Disc compounded agent daughter used which helped will get back with me the name   F/u foot MD ankle issues  Consider left shoulder and left knee Xray in future  4. Cards did not want to do echo at this time nl EKG and stress test 07/2017  5. Need to call GI Dr. Darien Ramus back [facility] to see when due for colonoscopy  6.  [facility] no records  have [facility]MA check vaccine database  -Had flu [facility] 03/2017 confirmed  Given 2/3 hep B vaccines today  pna 23 vx had 11/27/08  prevnar will need in future [facility] no record  Will disc shingrix in the future  ? tdap last dose   NO OTHER VACCINES NOTED IN [facility]  Called 05/2017 GI Dr. Darien Ramus [phone]  he was out of office until 06/21/17 to disc if colonoscopy due to h/o abnormal is really due 12/2017 or sooner.  -no one called me back so will call them again     Consider CT chest with chronic smoking hx quit 2014 calc risk  Out of age window pap  mammo 02/14/17 neg  DEXA 10/30/13 osteopenia will rec vit D 50K weekly and calcium 600 mg bid will rec at f/u         Provider: Dr. Olivia Mackie McLean-Scocuzza-Internal Medicine

## 2017-08-21 ENCOUNTER — Other Ambulatory Visit: Payer: Self-pay | Admitting: Internal Medicine

## 2017-08-21 NOTE — Progress Notes (Signed)
Hi, the topical pain medication my daughter was mentioning is compounded as follows: 3% diclofenac, 2% baclofen, 5% lidocaine and 1% menthol. She has the 100 grams size no refills. It was made in house at [facility], [address], [place] and phone number is [phone]. -1-2 pumps effected area tid to qid prn   [facility] to try to compound this for joint pains  [place]

## 2017-09-08 ENCOUNTER — Other Ambulatory Visit: Payer: Medicare Other

## 2017-09-11 ENCOUNTER — Encounter: Payer: Self-pay | Admitting: *Deleted

## 2017-09-11 ENCOUNTER — Encounter: Payer: Medicare Other | Attending: Internal Medicine | Admitting: *Deleted

## 2017-09-11 VITALS — BP 140/74 | Ht 61.0 in | Wt 172.9 lb

## 2017-09-11 DIAGNOSIS — E119 Type 2 diabetes mellitus without complications: Secondary | ICD-10-CM | POA: Diagnosis present

## 2017-09-11 DIAGNOSIS — Z119 Encounter for screening for infectious and parasitic diseases, unspecified: Secondary | ICD-10-CM | POA: Diagnosis not present

## 2017-09-11 DIAGNOSIS — Z713 Dietary counseling and surveillance: Secondary | ICD-10-CM | POA: Diagnosis present

## 2017-09-11 NOTE — Progress Notes (Signed)
Diabetes Self-Management Education  Visit Type: First/Initial  Appt. Start Time: 1515 Appt. End Time: 1640  09/11/2017  [name], identified by name and date of birth, is a 72 y.o. female with a diagnosis of Diabetes: Type 2.   ASSESSMENT  Blood pressure 140/74, height 5\' 1"  (1.549 m), weight 172 lb 14.4 oz (78.4 kg). Body mass index is 32.67 kg/m.  Diabetes Self-Management Education - 09/11/17 1653      Visit Information   Visit Type  First/Initial      Initial Visit   Diabetes Type  Type 2    Are you currently following a meal plan?  No    Are you taking your medications as prescribed?  Yes    Date Diagnosed  Unsure - "many years"      Health Coping   How would you rate your overall health?  Fair      Psychosocial Assessment   Patient Belief/Attitude about Diabetes  Other (comment) "undesirable"    Self-care barriers  None    Self-management support  Doctor's office;Family    Other persons present  Family Member daughter was present for 1/2 of visit    Patient Concerns  Nutrition/Meal planning;Glycemic Control;Medication;Weight Control;Healthy Lifestyle    Special Needs  None    Preferred Learning Style  Visual    Learning Readiness  Contemplating    How often do you need to have someone help you when you read instructions, pamphlets, or other written materials from your doctor or pharmacy?  1 - Never    What is the last grade level you completed in school?  15      Pre-Education Assessment   Patient understands the diabetes disease and treatment process.  Needs Review    Patient understands incorporating nutritional management into lifestyle.  Needs Instruction    Patient undertands incorporating physical activity into lifestyle.  Needs Review    Patient understands using medications safely.  Needs Review    Patient understands monitoring blood glucose, interpreting and using results  Needs Review    Patient understands prevention, detection, and treatment of  acute complications.  Needs Instruction    Patient understands prevention, detection, and treatment of chronic complications.  Needs Review    Patient understands how to develop strategies to address psychosocial issues.  Needs Instruction    Patient understands how to develop strategies to promote health/change behavior.  Needs Instruction      Complications   Last HgB A1C per patient/outside source  9.4 % 06/09/17    How often do you check your blood sugar?  1-2 times/day    Fasting Blood glucose range (mg/dL)  70-129 FBG's 96-124 mg/dL with 1 reading of 162 mg/dL after eating out the night before.     Have you had a dilated eye exam in the past 12 months?  Yes    Have you had a dental exam in the past 12 months?  No dentures    Are you checking your feet?  Yes    How many days per week are you checking your feet?  7      Dietary Intake   Breakfast  cereal and milk; [place] sausage and egg biscuit    Snack (morning)  graham crackers or peanut butter crackers    Lunch  skips    Snack (afternoon)  same as morning snack    Dinner  chicken pot pie from [employer]; 10 piece chicken nuggets; vegetable plat from [employer]; greens, broccoli, green  beans, cabbage, corn peas    Beverage(s)  wtaer, sugar sweetened tea, coffee, Coke Zero, juice      Exercise   Exercise Type  Light (walking / raking leaves)    How many days per week to you exercise?  3    How many minutes per day do you exercise?  30    Total minutes per week of exercise  90      Patient Education   Previous Diabetes Education  Yes (please comment) [facility] at [facility]    Disease state   Explored patient's options for treatment of their diabetes    Nutrition management   Role of diet in the treatment of diabetes and the relationship between the three main macronutrients and blood glucose level;Reviewed blood glucose goals for pre and post meals and how to evaluate the patients' food intake on their blood glucose level.;Information on  hints to eating out and maintain blood glucose control.    Physical activity and exercise   Role of exercise on diabetes management, blood pressure control and cardiac health.    Medications  Taught/reviewed insulin injection, site rotation, insulin storage and needle disposal.;Reviewed patients medication for diabetes, action, purpose, timing of dose and side effects.    Monitoring  Purpose and frequency of SMBG.;Taught/discussed recording of test results and interpretation of SMBG.;Identified appropriate SMBG and/or A1C goals.    Acute complications  Taught treatment of hypoglycemia - the 15 rule.    Chronic complications  Relationship between chronic complications and blood glucose control    Psychosocial adjustment  Identified and addressed patients feelings and concerns about diabetes      Individualized Goals (developed by patient)   Reducing Risk  Improve blood sugars Decrease medications Lose weight Lead a healthier lifestyle     Outcomes   Expected Outcomes  Demonstrated interest in learning. Expect positive outcomes    Future DMSE  2 wks       Individualized Plan for Diabetes Self-Management Training:   Learning Objective:  Patient will have a greater understanding of diabetes self-management. Patient education plan is to attend individual and/or group sessions per assessed needs and concerns.   Plan:   Patient Instructions  Check blood sugars 1-2 x day before breakfast and/or 2 hrs after lunch or supper every day Bring blood sugar records to the next appointment Exercise: Continue walking for   30  minutes   3-4  days a week Eat 3 meals day,  1-2  snacks a day Space meals 4-6 hours apart Eat a snack with 1 protein and 1 carbohydrate instead of skipping a meal Limit fried foods Avoid sugar sweetened drinks (tea, juices) Complete 3 Day Food Record and bring to next appt Carry fast acting glucose and a snack at all times Rotate injection sites Return for appointment on:   Monday September 25, 2017 at 3:00 pm with Pam (dietitian)  Expected Outcomes:  Demonstrated interest in learning. Expect positive outcomes  Education material provided:  General Meal Planning Guidelines Simple Meal Plan 3 Day Food Record Glucose tablets Symptoms, causes and treatments of Hypoglycemia Site selection for pen injection (BD)  If problems or questions, patient to contact team via:  Johny Drilling, [place], [place], CDE [phone]  Future DSME appointment: 2 wks  September 25, 2017 with the dietitian

## 2017-09-11 NOTE — Patient Instructions (Signed)
Check blood sugars 1-2 x day before breakfast and/or 2 hrs after lunch or supper every day Bring blood sugar records to the next appointment  Exercise: Continue walking for   30  minutes   3-4  days a week  Eat 3 meals day,  1-2  snacks a day Space meals 4-6 hours apart Eat a snack with 1 protein and 1 carbohydrate instead of skipping a meal Limit fried foods Avoid sugar sweetened drinks (tea, juices) Complete 3 Day Food Record and bring to next appt  Carry fast acting glucose and a snack at all times Rotate injection sites  Return for appointment on:  Monday September 25, 2017 at 3:00 pm with [facility] (dietitian)

## 2017-09-13 ENCOUNTER — Other Ambulatory Visit (INDEPENDENT_AMBULATORY_CARE_PROVIDER_SITE_OTHER): Payer: Medicare Other

## 2017-09-13 DIAGNOSIS — Z794 Long term (current) use of insulin: Secondary | ICD-10-CM | POA: Diagnosis not present

## 2017-09-13 DIAGNOSIS — E1165 Type 2 diabetes mellitus with hyperglycemia: Secondary | ICD-10-CM

## 2017-09-13 LAB — COMPREHENSIVE METABOLIC PANEL
ALBUMIN: 3.8 g/dL (ref 3.5–5.2)
ALT: 15 U/L (ref 0–35)
AST: 20 U/L (ref 0–37)
Alkaline Phosphatase: 96 U/L (ref 39–117)
BILIRUBIN TOTAL: 0.3 mg/dL (ref 0.2–1.2)
BUN: 16 mg/dL (ref 6–23)
CALCIUM: 9.6 mg/dL (ref 8.4–10.5)
CO2: 27 mEq/L (ref 19–32)
Chloride: 106 mEq/L (ref 96–112)
Creatinine, Ser: 0.96 mg/dL (ref 0.40–1.20)
GFR: 73.46 mL/min (ref >60.00)
Glucose, Bld: 92 mg/dL (ref 70–99)
Potassium: 4 mEq/L (ref 3.5–5.1)
Sodium: 143 mEq/L (ref 135–145)
Total Protein: 7.7 g/dL (ref 6.0–8.3)

## 2017-09-13 LAB — URINALYSIS, ROUTINE W REFLEX MICROSCOPIC
Bilirubin Urine: NEGATIVE
Hgb urine dipstick: NEGATIVE
Ketones, ur: NEGATIVE
LEUKOCYTES UA: NEGATIVE
Nitrite: NEGATIVE
PH: 5.5 (ref 5.0–8.0)
RBC / HPF: NONE SEEN (ref 0–?)
Specific Gravity, Urine: 1.02 (ref 1.000–1.030)
TOTAL PROTEIN, URINE-UPE24: NEGATIVE
Urine Glucose: NEGATIVE
Urobilinogen, UA: 0.2 (ref 0.0–1.0)

## 2017-09-13 LAB — MICROALBUMIN / CREATININE URINE RATIO
CREATININE, U: 110.6 mg/dL
MICROALB/CREAT RATIO: 7.4 mg/g (ref 0.0–30.0)
Microalb, Ur: 8.2 mg/dL — ABNORMAL HIGH (ref 0.0–1.9)

## 2017-09-13 LAB — HEMOGLOBIN A1C: Hgb A1c MFr Bld: 7.7 % — ABNORMAL HIGH (ref 4.6–6.5)

## 2017-09-25 ENCOUNTER — Encounter: Payer: Medicare Other | Attending: Internal Medicine | Admitting: Dietician

## 2017-09-25 ENCOUNTER — Encounter: Payer: Self-pay | Admitting: *Deleted

## 2017-09-25 ENCOUNTER — Encounter: Payer: Self-pay | Admitting: Dietician

## 2017-09-25 VITALS — BP 132/78 | Ht 61.0 in | Wt 171.3 lb

## 2017-09-25 DIAGNOSIS — E119 Type 2 diabetes mellitus without complications: Secondary | ICD-10-CM

## 2017-09-25 DIAGNOSIS — Z713 Dietary counseling and surveillance: Secondary | ICD-10-CM | POA: Diagnosis present

## 2017-09-25 DIAGNOSIS — Z119 Encounter for screening for infectious and parasitic diseases, unspecified: Secondary | ICD-10-CM | POA: Diagnosis not present

## 2017-09-25 NOTE — Progress Notes (Signed)
Diabetes Self-Management Education  Visit Type:  Follow-up  Appt. Start Time: 1500 Appt. End Time: 1600  09/25/2017  [name], identified by name and date of birth, is a 72 y.o. female with a diagnosis of Diabetes:  .   ASSESSMENT  Blood pressure 132/78, height 5\' 1"  (1.549 m), weight 171 lb 4.8 oz (77.7 kg). Body mass index is 32.37 kg/m.   Diabetes Self-Management Education - 19/14/78 2956      Complications   How often do you check your blood sugar?  1-2 times/day    Fasting Blood glucose range (mg/dL)  70-129    Postprandial Blood glucose range (mg/dL)  130-179 patient did not record sporadic afternoon tests; reports highest reading has been 183 after restaurant meal    Have you had a dilated eye exam in the past 12 months?  Yes    Have you had a dental exam in the past 12 months?  No    Are you checking your feet?  Yes    How many days per week are you checking your feet?  7      Dietary Intake   Breakfast  3 meals and 2-3 snacks daily      Exercise   Exercise Type  Light (walking / raking leaves)    How many days per week to you exercise?  3.5    How many minutes per day do you exercise?  30    Total minutes per week of exercise  105      Patient Education   Nutrition management   Role of diet in the treatment of diabetes and the relationship between the three main macronutrients and blood glucose level;Meal options for control of blood glucose level and chronic complications.;Other (comment) basic meal planning using plate method and menus    Monitoring  Taught/discussed recording of test results and interpretation of SMBG.      Post-Education Assessment   Patient understands the diabetes disease and treatment process.  Demonstrates understanding / competency    Patient understands incorporating nutritional management into lifestyle.  Demonstrates understanding / competency    Patient undertands incorporating physical activity into lifestyle.  Demonstrates  understanding / competency    Patient understands using medications safely.  Demonstrates understanding / competency    Patient understands monitoring blood glucose, interpreting and using results  Demonstrates understanding / competency    Patient understands prevention, detection, and treatment of acute complications.  Demonstrates understanding / competency    Patient understands prevention, detection, and treatment of chronic complications.  Demonstrates understanding / competency    Patient understands how to develop strategies to address psychosocial issues.  Needs Review    Patient understands how to develop strategies to promote health/change behavior.  Needs Review      Outcomes   Program Status  Completed       Learning Objective:  Patient will have a greater understanding of diabetes self-management. Patient education plan is to attend individual and/or group sessions per assessed needs and concerns.  Natalie Watts is eating at regular intervals and is working to keep a healthy balance in her meals. Some breakfast meals might be high in carbohydrate and low in protein. Encouraged her to keep carbohydrate intake as consistent as possible during the day.   Plan:   Patient Instructions   Doristine Devoid job eating all your meals.   Use menus for meal ideas. Make sure to have a protein food with every meal, along with plenty of "Free" vegetables  and small portions of starches.   At breakfast, eat either cold cereal or oatmeal or a biscuit (ideally English muffin) but not both cereal + biscuit or cereal + oatmeal. Include protein by adding eggs or peanut butter.     Expected Outcomes:  Demonstrated interest in learning. Expect positive outcomes  Education material provided: Plate planner with food lists; Quick and Easy Meal Ideas menus  If problems or questions, patient to contact team via:  Phone

## 2017-09-25 NOTE — Patient Instructions (Signed)
   Great job eating all your meals.   Use menus for meal ideas. Make sure to have a protein food with every meal, along with plenty of "Free" vegetables and small portions of starches.   At breakfast, eat either cold cereal or oatmeal or a biscuit (ideally English muffin) but not both cereal + biscuit or cereal + oatmeal. Include protein by adding eggs or peanut butter.

## 2017-09-28 NOTE — Telephone Encounter (Signed)
Unread mychart message mailed to patient 

## 2017-10-09 ENCOUNTER — Encounter: Payer: Self-pay | Admitting: Family Medicine

## 2017-10-09 ENCOUNTER — Other Ambulatory Visit: Payer: Self-pay

## 2017-10-09 ENCOUNTER — Ambulatory Visit: Payer: Medicare Other | Admitting: Family Medicine

## 2017-10-09 DIAGNOSIS — H1010 Acute atopic conjunctivitis, unspecified eye: Secondary | ICD-10-CM

## 2017-10-09 DIAGNOSIS — H1013 Acute atopic conjunctivitis, bilateral: Secondary | ICD-10-CM

## 2017-10-09 HISTORY — DX: Acute atopic conjunctivitis, unspecified eye: H10.10

## 2017-10-09 MED ORDER — LORATADINE 10 MG PO TABS: 10.0000 mg | ORAL_TABLET | Freq: Every day | ORAL | 0 refills | Status: DC

## 2017-10-09 NOTE — Assessment & Plan Note (Signed)
Symptoms are most consistent with allergic conjunctivitis.  Exam is reassuring.  No red flags.  Discussed use of Claritin versus Flonase.  She reports a family history of glaucoma and wants to avoid anything that would place her at risk for that.  We opted for Claritin as she has tolerated this in the past.  Advised small potential for drowsiness with this.  She will monitor.  Given return precautions.

## 2017-10-09 NOTE — Patient Instructions (Signed)
Nice to see you. Your eye symptoms are likely related to allergies.  Please start on the Claritin that was sent to your pharmacy.  If they do not improve please let us know.  You can also use a warm washcloth to see if that is beneficial. If you develop eye pain, vision changes, or extreme light sensitivity please seek medical attention.

## 2017-10-09 NOTE — Progress Notes (Signed)
  Tommi Rumps, MD Phone: [phone]  Natalie Watts is a 72 y.o. female who presents today for same day visit.  Patient notes starting about a week ago her right eye became itchy and watery.  Notes some mild itching.  No matting shut though has had crusting on her eyelashes.  Watery drainage at times.  Notes some symptoms in her left eye as well.  No vision changes.  No photophobia.  No eye pain.  Warm compresses have been beneficial.  She does have a history of allergies for which she was on Claritin previously.  Social History   Tobacco Use  Smoking Status Former Smoker  . Packs/day: 1.00  . Years: 45.00  . Pack years: 45.00  . Types: Cigarettes  . Last attempt to quit: 12/18/2012  . Years since quitting: 4.8  Smokeless Tobacco Never Used     ROS see history of present illness  Objective  Physical Exam Vitals:   10/09/17 1031  BP: 120/68  Pulse: 83  Temp: 98.6 F (37 C)  SpO2: 97%    BP Readings from Last 3 Encounters:  10/09/17 120/68  09/25/17 132/78  09/11/17 140/74   Wt Readings from Last 3 Encounters:  10/09/17 169 lb 6.4 oz (76.8 kg)  09/25/17 171 lb 4.8 oz (77.7 kg)  09/11/17 172 lb 14.4 oz (78.4 kg)    Physical Exam  Constitutional: No distress.  HENT:  Head: Normocephalic and atraumatic.  Mouth/Throat: Oropharynx is clear and moist.  Eyes: Pupils are equal, round, and reactive to light. Conjunctivae are normal.  No gross corneal changes  Cardiovascular: Normal rate and regular rhythm.  Pulmonary/Chest: Effort normal and breath sounds normal.  Skin: She is not diaphoretic.     Assessment/Plan: Please see individual problem list.  Allergic conjunctivitis Symptoms are most consistent with allergic conjunctivitis.  Exam is reassuring.  No red flags.  Discussed use of Claritin versus Flonase.  She reports a family history of glaucoma and wants to avoid anything that would place her at risk for that.  We opted for Claritin as she has tolerated  this in the past.  Advised small potential for drowsiness with this.  She will monitor.  Given return precautions.   No orders of the defined types were placed in this encounter.   Meds ordered this encounter  Medications  . loratadine (CLARITIN) 10 MG tablet    Sig: Take 1 tablet (10 mg total) by mouth daily.    Dispense:  30 tablet    Refill:  0     Tommi Rumps, MD [place]

## 2017-10-17 ENCOUNTER — Ambulatory Visit: Payer: Medicare Other | Admitting: Internal Medicine

## 2017-10-17 ENCOUNTER — Encounter: Payer: Self-pay | Admitting: Internal Medicine

## 2017-10-17 ENCOUNTER — Ambulatory Visit (INDEPENDENT_AMBULATORY_CARE_PROVIDER_SITE_OTHER): Payer: Medicare Other

## 2017-10-17 VITALS — BP 136/64 | HR 96 | Temp 98.7°F | Ht 61.0 in | Wt 169.8 lb

## 2017-10-17 DIAGNOSIS — M79675 Pain in left toe(s): Secondary | ICD-10-CM

## 2017-10-17 DIAGNOSIS — Z23 Encounter for immunization: Secondary | ICD-10-CM | POA: Diagnosis not present

## 2017-10-17 DIAGNOSIS — I10 Essential (primary) hypertension: Secondary | ICD-10-CM

## 2017-10-17 DIAGNOSIS — E119 Type 2 diabetes mellitus without complications: Secondary | ICD-10-CM | POA: Diagnosis not present

## 2017-10-17 DIAGNOSIS — Z794 Long term (current) use of insulin: Secondary | ICD-10-CM | POA: Diagnosis not present

## 2017-10-17 DIAGNOSIS — E559 Vitamin D deficiency, unspecified: Secondary | ICD-10-CM | POA: Diagnosis not present

## 2017-10-17 NOTE — Progress Notes (Signed)
No chief complaint on file.  F/u with daughter  1. DM 2 improved a1c 7.7 on lantus 53 units and Januvia 100  2. HTN controlled norvasc 10 mg qd  3. C/o left toe pain intermittently h/o gout per daughter not on meds for gout  Review of Systems  Constitutional: Negative for weight loss.  HENT: Negative for hearing loss.   Eyes: Negative for blurred vision.       +itching red watery eyes   Respiratory: Negative for shortness of breath.   Cardiovascular: Negative for chest pain.  Gastrointestinal: Negative for abdominal pain.  Musculoskeletal: Positive for joint pain.       Left toe pain  Right heel pain   Skin: Negative for rash.  Neurological: Negative for headaches.  Psychiatric/Behavioral: Negative for depression.   Past Medical History:  Diagnosis Date  . 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 Carotid bruit    US carotid had 09/03/14 [facility] [place]   . Carpal tunnel syndrome   . Cervical radiculopathy    improved since surgery 10-11-2014   . Chicken pox   . 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 Loann Quill [facility]   . Degenerative disk disease   . Diabetes ([place])   . DVT, lower extremity ([place])    left, 05/2013 on Xarelto x 1 month etiology unkown   . GERD (gastroesophageal reflux disease)   . HSV-2 infection   . Hyperlipidemia   . Hypertension   . Insomnia   . Intertrigo   . Lumbar radiculopathy    improved after steroid inj and PT  . Peripheral vascular disease ([place])   . Subdeltoid bursitis   . Vitamin D deficiency    Past Surgical History:  Procedure Laterality Date  . 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 Sheppard Evens  . COLON SURGERY    . TUBAL LIGATION     Family History  Problem Relation Age of Onset  . Hypertension Mother   . Arthritis Mother   . Cancer Father        colon cancer dx'ed died age 74  . Hypertension Sister   . Arthritis Sister   . Cancer Sister        brain tumor h/o lung cancer smoker  died 2015-10-11   . Early death Sister   . Breast cancer Maternal Grandmother   . Cancer Maternal Grandmother        breast dx older age   . Hypertension Maternal Grandmother   . Breast cancer Other        maternal neice  . Hypertension Daughter   . Hypertension Daughter    Social History   Socioeconomic History  . Marital status: Divorced    Spouse name: Not on file  . Number of children: Not on file  . Years of education: Not on file  . Highest education level: Not on file  Occupational History  . Not on file  Social Needs  . Financial resource strain: Not on file  . Food insecurity:    Worry: Not on file    Inability: Not on file  . Transportation needs:    Medical: Not on file    Non-medical: Not on file  Tobacco Use  . Smoking status: Former Smoker    Packs/day: 1.00    Years: 45.00    Pack years: 45.00    Types: Cigarettes    Last attempt to quit: 12/18/2012  Years since quitting: 4.8  . Smokeless tobacco: Never Used  Substance and Sexual Activity  . Alcohol use: No  . Drug use: Yes    Frequency: 3.0 times per week  . Sexual activity: Not on file  Lifestyle  . Physical activity:    Days per week: Not on file    Minutes per session: Not on file  . Stress: Not on file  Relationships  . Social connections:    Talks on phone: Not on file    Gets together: Not on file    Attends religious service: Not on file    Active member of club or organization: Not on file    Attends meetings of clubs or organizations: Not on file    Relationship status: Not on file  . Intimate partner violence:    Fear of current or ex partner: Not on file    Emotionally abused: Not on file    Physically abused: Not on file    Forced sexual activity: Not on file  Other Topics Concern  . Not on file  Social History Narrative   Divorced    2 daughters [name] comes to most appts and is Chief Executive Officer    Used to work for [employer] now [employer]    Former smoker age 48 to age 72/2012  quit for sure in 2014 max 1/2 ppd FH lung cancer sister also smoker    No outpatient medications have been marked as taking for the 10/17/17 encounter (Appointment) with McLean-Scocuzza, Nino Glow, MD.   Allergies  Allergen Reactions  . Lisinopril Anaphylaxis and Swelling    Tongue swelling Tongue swelling Tongue swelling Tongue swelling Tongue swelling Tongue swelling Tongue swelling Tongue swelling Tongue swelling Tongue swelling Tongue swelling Tongue swelling  . Sulfamethoxazole-Trimethoprim Other (See Comments)    Other reaction(s): Other (See Comments)  . Metformin And Related     GI sx's   . Penicillins     Childhood  . Tramadol Nausea Only and Nausea And Vomiting   Recent Results (from the past 2160 hour(s))  NM Myocar Multi W/Spect W/Wall Motion / EF     Status: None   Collection Time: 08/04/17 12:07 PM  Result Value Ref Range   Rest HR 65 bpm   Rest BP 143/70 mmHg   Exercise duration (sec) 0 sec   Percent HR 55 %   Exercise duration (min) 0 min   Estimated workload 1.0 METS   Peak HR 83 bpm   Peak BP 110/58 mmHg   MPHR 149 bpm   SSS 9    SRS 8    SDS 3    TID 1.05    LV sys vol 6 mL   LV dias vol 26 46 - 106 mL  Urine Microalbumin w/creat. ratio     Status: Abnormal   Collection Time: 09/13/17  7:59 AM  Result Value Ref Range   Microalb, Ur 8.2 (H) 0.0 - 1.9 mg/dL   Creatinine,U 110.6 mg/dL   Microalb Creat Ratio 7.4 0.0 - 30.0 mg/g  Urinalysis, Routine w reflex microscopic     Status: None   Collection Time: 09/13/17  7:59 AM  Result Value Ref Range   Color, Urine YELLOW Yellow;Lt. Yellow   APPearance CLEAR Clear   Specific Gravity, Urine 1.020 1.000 - 1.030   pH 5.5 5.0 - 8.0   Total Protein, Urine NEGATIVE Negative   Urine Glucose NEGATIVE Negative   Ketones, ur NEGATIVE Negative   Bilirubin Urine NEGATIVE Negative  Hgb urine dipstick NEGATIVE Negative   Urobilinogen, UA 0.2 0.0 - 1.0   Leukocytes, UA NEGATIVE Negative   Nitrite NEGATIVE  Negative   WBC, UA 0-2/hpf 0-2/hpf   RBC / HPF none seen 0-2/hpf   Squamous Epithelial / LPF Rare(0-4/hpf) Rare(0-4/hpf)  Hemoglobin A1c     Status: Abnormal   Collection Time: 09/13/17  7:59 AM  Result Value Ref Range   Hgb A1c MFr Bld 7.7 (H) 4.6 - 6.5 %    Comment: Glycemic Control Guidelines for People with Diabetes:Non Diabetic:  <6%Goal of Therapy: <7%Additional Action Suggested:  >8%   Comprehensive metabolic panel     Status: None   Collection Time: 09/13/17  7:59 AM  Result Value Ref Range   Sodium 143 135 - 145 mEq/L   Potassium 4.0 3.5 - 5.1 mEq/L   Chloride 106 96 - 112 mEq/L   CO2 27 19 - 32 mEq/L   Glucose, Bld 92 70 - 99 mg/dL   BUN 16 6 - 23 mg/dL   Creatinine, Ser 0.96 0.40 - 1.20 mg/dL   Total Bilirubin 0.3 0.2 - 1.2 mg/dL   Alkaline Phosphatase 96 39 - 117 U/L   AST 20 0 - 37 U/L   ALT 15 0 - 35 U/L   Total Protein 7.7 6.0 - 8.3 g/dL   Albumin 3.8 3.5 - 5.2 g/dL   Calcium 9.6 8.4 - 10.5 mg/dL   GFR 73.46 >60.00 mL/min   Objective  There is no height or weight on file to calculate BMI. Wt Readings from Last 3 Encounters:  10/09/17 169 lb 6.4 oz (76.8 kg)  09/25/17 171 lb 4.8 oz (77.7 kg)  09/11/17 172 lb 14.4 oz (78.4 kg)   Temp Readings from Last 3 Encounters:  10/09/17 98.6 F (37 C) (Oral)  08/17/17 98.7 F (37.1 C) (Oral)  06/15/17 98.6 F (37 C) (Oral)   BP Readings from Last 3 Encounters:  10/09/17 120/68  09/25/17 132/78  09/11/17 140/74   Pulse Readings from Last 3 Encounters:  10/09/17 83  08/17/17 81  07/25/17 74    Physical Exam  Constitutional: She is oriented to person, place, and time. Vital signs are normal. She appears well-developed and well-nourished. She is cooperative.  HENT:  Head: Normocephalic and atraumatic.  Mouth/Throat: Oropharynx is clear and moist and mucous membranes are normal.  Eyes: Pupils are equal, round, and reactive to light. Conjunctivae are normal.  Cardiovascular: Normal rate and regular rhythm.    Murmur heard. Pulmonary/Chest: Effort normal and breath sounds normal.  Musculoskeletal:       Feet:  Left toe pain ttp mild to mod No right heel pain   Neurological: She is alert and oriented to person, place, and time. Gait normal.  Skin: Skin is warm, dry and intact.  Psychiatric: She has a normal mood and affect. Her speech is normal and behavior is normal. Judgment and thought content normal.  Nursing note and vitals reviewed.   Assessment   1. HTN controlled  2. DM 2 A1.c 7.7 09/13/17  3. HM Plan   1. Cont norvascl 10 mg qd 2. Lantus 53 units and januvia 100 mg qd  Eye exam due 12/2017 [facility]  F/u foot md  Urine had 09/13/17  3.  -Had flu [facility] 03/2017 confirmed  Given 2/3 hep B vaccines today  pna 23 vx had 11/27/08  prevnar given today  Will disc shingrix in the future  ? tdap last dose   appt GI Dr.  Webster [phone] colonoscopy 6 or 12/2017   Consider CT chest with chronic smoking hx quit 2014 calc risk  Out of age window pap  mammo 02/14/17 neg refer at f/u  DEXA 10/30/13 osteopenia will rec vit D 50K weekly and calcium 600 mg bid  TBST 10/09/17 neg 10/12/17      Provider: Dr. Olivia Mackie McLean-Scocuzza-Internal Medicine

## 2017-10-17 NOTE — Patient Instructions (Addendum)
Please take your vitamin D and calcium 600 mg 2x per day  Fasting labs early July please sch and follow up with me in mid July   Pneumococcal Conjugate Vaccine suspension for injection What is this medicine? PNEUMOCOCCAL VACCINE (NEU mo KOK al vak SEEN) is a vaccine used to prevent pneumococcus bacterial infections. These bacteria can cause serious infections like pneumonia, meningitis, and blood infections. This vaccine will lower your chance of getting pneumonia. If you do get pneumonia, it can make your symptoms milder and your illness shorter. This vaccine will not treat an infection and will not cause infection. This vaccine is recommended for infants and young children, adults with certain medical conditions, and adults 50 years or older. This medicine may be used for other purposes; ask your health care provider or pharmacist if you have questions. COMMON BRAND NAME(S): Prevnar, Prevnar 13 What should I tell my health care provider before I take this medicine? They need to know if you have any of these conditions: -bleeding problems -fever -immune system problems -an unusual or allergic reaction to pneumococcal vaccine, diphtheria toxoid, other vaccines, latex, other medicines, foods, dyes, or preservatives -pregnant or trying to get pregnant -breast-feeding How should I use this medicine? This vaccine is for injection into a muscle. It is given by a health care professional. A copy of Vaccine Information Statements will be given before each vaccination. Read this sheet carefully each time. The sheet may change frequently. Talk to your pediatrician regarding the use of this medicine in children. While this drug may be prescribed for children as young as 79 weeks old for selected conditions, precautions do apply. Overdosage: If you think you have taken too much of this medicine contact a poison control center or emergency room at once. NOTE: This medicine is only for you. Do not share this  medicine with others. What if I miss a dose? It is important not to miss your dose. Call your doctor or health care professional if you are unable to keep an appointment. What may interact with this medicine? -medicines for cancer chemotherapy -medicines that suppress your immune function -steroid medicines like prednisone or cortisone This list may not describe all possible interactions. Give your health care provider a list of all the medicines, herbs, non-prescription drugs, or dietary supplements you use. Also tell them if you smoke, drink alcohol, or use illegal drugs. Some items may interact with your medicine. What should I watch for while using this medicine? Mild fever and pain should go away in 3 days or less. Report any unusual symptoms to your doctor or health care professional. What side effects may I notice from receiving this medicine? Side effects that you should report to your doctor or health care professional as soon as possible: -allergic reactions like skin rash, itching or hives, swelling of the face, lips, or tongue -breathing problems -confused -fast or irregular heartbeat -fever over 102 degrees F -seizures -unusual bleeding or bruising -unusual muscle weakness Side effects that usually do not require medical attention (report to your doctor or health care professional if they continue or are bothersome): -aches and pains -diarrhea -fever of 102 degrees F or less -headache -irritable -loss of appetite -pain, tender at site where injected -trouble sleeping This list may not describe all possible side effects. Call your doctor for medical advice about side effects. You may report side effects to FDA at 1-800-FDA-1088. Where should I keep my medicine? This does not apply. This vaccine is given in a  clinic, pharmacy, doctor's office, or other health care setting and will not be stored at home. NOTE: This sheet is a summary. It may not cover all possible  information. If you have questions about this medicine, talk to your doctor, pharmacist, or health care provider.  2018 Elsevier/Gold Standard (2014-03-13 10:27:27)  Gout Gout is painful swelling that can occur in some of your joints. Gout is a type of arthritis. This condition is caused by having too much uric acid in your body. Uric acid is a chemical that forms when your body breaks down substances called purines. Purines are important for building body proteins. When your body has too much uric acid, sharp crystals can form and build up inside your joints. This causes pain and swelling. Gout attacks can happen quickly and be very painful (acute gout). Over time, the attacks can affect more joints and become more frequent (chronic gout). Gout can also cause uric acid to build up under your skin and inside your kidneys. What are the causes? This condition is caused by too much uric acid in your blood. This can occur because:  Your kidneys do not remove enough uric acid from your blood. This is the most common cause.  Your body makes too much uric acid. This can occur with some cancers and cancer treatments. It can also occur if your body is breaking down too many red blood cells (hemolytic anemia).  You eat too many foods that are high in purines. These foods include organ meats and some seafood. Alcohol, especially beer, is also high in purines.  A gout attack may be triggered by trauma or stress. What increases the risk? This condition is more likely to develop in people who:  Have a family history of gout.  Are female and middle-aged.  Are female and have gone through menopause.  Are obese.  Frequently drink alcohol, especially beer.  Are dehydrated.  Lose weight too quickly.  Have an organ transplant.  Have lead poisoning.  Take certain medicines, including aspirin, cyclosporine, diuretics, levodopa, and niacin.  Have kidney disease or psoriasis.  What are the signs or  symptoms? An attack of acute gout happens quickly. It usually occurs in just one joint. The most common place is the big toe. Attacks often start at night. Other joints that may be affected include joints of the feet, ankle, knee, fingers, wrist, or elbow. Symptoms may include:  Severe pain.  Warmth.  Swelling.  Stiffness.  Tenderness. The affected joint may be very painful to touch.  Shiny, red, or purple skin.  Chills and fever.  Chronic gout may cause symptoms more frequently. More joints may be involved. You may also have white or yellow lumps (tophi) on your hands or feet or in other areas near your joints. How is this diagnosed? This condition is diagnosed based on your symptoms, medical history, and physical exam. You may have tests, such as:  Blood tests to measure uric acid levels.  Removal of joint fluid with a needle (aspiration) to look for uric acid crystals.  X-rays to look for joint damage.  How is this treated? Treatment for this condition has two phases: treating an acute attack and preventing future attacks. Acute gout treatment may include medicines to reduce pain and swelling, including:  NSAIDs.  Steroids. These are strong anti-inflammatory medicines that can be taken by mouth (orally) or injected into a joint.  Colchicine. This medicine relieves pain and swelling when it is taken soon after an attack. It  can be given orally or through an IV tube.  Preventive treatment may include:  Daily use of smaller doses of NSAIDs or colchicine.  Use of a medicine that reduces uric acid levels in your blood.  Changes to your diet. You may need to see a specialist about healthy eating (dietitian).  Follow these instructions at home: During a Gout Attack  If directed, apply ice to the affected area: ? Put ice in a plastic bag. ? Place a towel between your skin and the bag. ? Leave the ice on for 20 minutes, 2-3 times a day.  Rest the joint as much as  possible. If the affected joint is in your leg, you may be given crutches to use.  Raise (elevate) the affected joint above the level of your heart as often as possible.  Drink enough fluids to keep your urine clear or pale yellow.  Take over-the-counter and prescription medicines only as told by your health care provider.  Do not drive or operate heavy machinery while taking prescription pain medicine.  Follow instructions from your health care provider about eating or drinking restrictions.  Return to your normal activities as told by your health care provider. Ask your health care provider what activities are safe for you. Avoiding Future Gout Attacks  Follow a low-purine diet as told by your dietitian or health care provider. Avoid foods and drinks that are high in purines, including liver, kidney, anchovies, asparagus, herring, mushrooms, mussels, and beer.  Limit alcohol intake to no more than 1 drink a day for nonpregnant women and 2 drinks a day for men. One drink equals 12 oz of beer, 5 oz of wine, or 1 oz of hard liquor.  Maintain a healthy weight or lose weight if you are overweight. If you want to lose weight, talk with your health care provider. It is important that you do not lose weight too quickly.  Start or maintain an exercise program as told by your health care provider.  Drink enough fluids to keep your urine clear or pale yellow.  Take over-the-counter and prescription medicines only as told by your health care provider.  Keep all follow-up visits as told by your health care provider. This is important. Contact a health care provider if:  You have another gout attack.  You continue to have symptoms of a gout attack after10 days of treatment.  You have side effects from your medicines.  You have chills or a fever.  You have burning pain when you urinate.  You have pain in your lower back or belly. Get help right away if:  You have severe or uncontrolled  pain.  You cannot urinate. This information is not intended to replace advice given to you by your health care provider. Make sure you discuss any questions you have with your health care provider. Document Released: 06/03/2000 Document Revised: 11/12/2015 Document Reviewed: 03/19/2015 Elsevier Interactive Patient Education  2018 [place] are compounds that affect the level of uric acid in your body. A low-purine diet is a diet that is low in purines. Eating a low-purine diet can prevent the level of uric acid in your body from getting too high and causing gout or kidney stones or both. What do I need to know about this diet?  Choose low-purine foods. Examples of low-purine foods are listed in the next section.  Drink plenty of fluids, especially water. Fluids can help remove uric acid from your body. Try to drink  8-16 cups (1.9-3.8 L) a day.  Limit foods high in fat, especially saturated fat, as fat makes it harder for the body to get rid of uric acid. Foods high in saturated fat include pizza, cheese, ice cream, whole milk, fried foods, and gravies. Choose foods that are lower in fat and lean sources of protein. Use olive oil when cooking as it contains healthy fats that are not high in saturated fat.  Limit alcohol. Alcohol interferes with the elimination of uric acid from your body. If you are having a gout attack, avoid all alcohol.  Keep in mind that different people's bodies react differently to different foods. You will probably learn over time which foods do or do not affect you. If you discover that a food tends to cause your gout to flare up, avoid eating that food. You can more freely enjoy foods that do not cause problems. If you have any questions about a food item, talk to your dietitian or health care provider. Which foods are low, moderate, and high in purines? The following is a list of foods that are low, moderate, and high in purines. You can  eat any amount of the foods that are low in purines. You may be able to have small amounts of foods that are moderate in purines. Ask your health care provider how much of a food moderate in purines you can have. Avoid foods high in purines. Grains  Foods low in purines: Enriched white bread, pasta, rice, cake, cornbread, popcorn.  Foods moderate in purines: Whole-grain breads and cereals, wheat germ, bran, oatmeal. Uncooked oatmeal. Dry wheat bran or wheat germ.  Foods high in purines: Pancakes, [place] toast, biscuits, muffins. Vegetables  Foods low in purines: All vegetables, except those that are moderate in purines.  Foods moderate in purines: Asparagus, cauliflower, spinach, mushrooms, green peas. Fruits  All fruits are low in purines. Meats and other Protein Foods  Foods low in purines: Eggs, nuts, peanut butter.  Foods moderate in purines: 80-90% lean beef, lamb, veal, pork, poultry, fish, eggs, peanut butter, nuts. Crab, lobster, oysters, and shrimp. Cooked dried beans, peas, and lentils.  Foods high in purines: Anchovies, sardines, herring, mussels, tuna, codfish, scallops, trout, and haddock. Berniece Salines. Organ meats (such as liver or kidney). [place]. Game meat. Goose. Sweetbreads. Dairy  All dairy foods are low in purines. Low-fat and fat-free dairy products are best because they are low in saturated fat. Beverages  Drinks low in purines: Water, carbonated beverages, tea, coffee, cocoa.  Drinks moderate in purines: Soft drinks and other drinks sweetened with high-fructose corn syrup. Juices. To find whether a food or drink is sweetened with high-fructose corn syrup, look at the ingredients list.  Drinks high in purines: Alcoholic beverages (such as beer). Condiments  Foods low in purines: Salt, herbs, olives, pickles, relishes, vinegar.  Foods moderate in purines: Butter, margarine, oils, mayonnaise. Fats and Oils  Foods low in purines: All types, except gravies and sauces  made with meat.  Foods high in purines: Gravies and sauces made with meat. Other Foods  Foods low in purines: Sugars, sweets, gelatin. Cake. Soups made without meat.  Foods moderate in purines: Meat-based or fish-based soups, broths, or bouillons. Foods and drinks sweetened with high-fructose corn syrup.  Foods high in purines: High-fat desserts (such as ice cream, cookies, cakes, pies, doughnuts, and chocolate). Contact your dietitian for more information on foods that are not listed here. This information is not intended to replace advice given to you by  your health care provider. Make sure you discuss any questions you have with your health care provider. Document Released: 10/01/2010 Document Revised: 11/12/2015 Document Reviewed: 05/13/2013 Elsevier Interactive Patient Education  2017 [employer].

## 2017-10-17 NOTE — Progress Notes (Signed)
Pre visit review using our clinic review tool, if applicable. No additional management support is needed unless otherwise documented below in the visit note. 

## 2017-10-17 NOTE — Addendum Note (Signed)
Addended by: Orland Mustard on: 10/17/2017 11:12 AM   Modules accepted: Orders

## 2017-10-19 ENCOUNTER — Telehealth: Payer: Self-pay | Admitting: Internal Medicine

## 2017-10-19 NOTE — Telephone Encounter (Signed)
Pt called back to get results of he left foot xray. Results given to her with verbal understanding. Pt also advised of the recommendations and she has an appointment with Dr. Amalia Hailey her podiatrist for May 14.

## 2017-10-23 ENCOUNTER — Telehealth: Payer: Self-pay | Admitting: Internal Medicine

## 2017-10-23 NOTE — Telephone Encounter (Signed)
Copied from [place] [id number]. Topic: Quick Communication - Rx Refill/Question >> Oct 23, 2017  5:45 PM Oliver Pila B wrote: Medication: pt needs the needles that connect to the lantus solostar pen; pt does not know the name Has the patient contacted their pharmacy? Yes.   (Agent: If no, request that the patient contact the pharmacy for the refill.) Preferred Pharmacy (with phone number [facility] street [facility]): [facility] Agent: Please be advised that RX refills may take up to 3 business days. We ask that you follow-up with your pharmacy.

## 2017-10-24 NOTE — Telephone Encounter (Signed)
Please advise 

## 2017-10-24 NOTE — Telephone Encounter (Signed)
Pt requesting needles that connect to Lantus Solostart pen.Rx not on pt's current medication list.   LOV: 10/17/17 Dr. McLean-Scocuzza  [facility] on [facility]

## 2017-10-26 ENCOUNTER — Other Ambulatory Visit: Payer: Self-pay | Admitting: Internal Medicine

## 2017-10-26 DIAGNOSIS — Z794 Long term (current) use of insulin: Principal | ICD-10-CM

## 2017-10-26 DIAGNOSIS — E119 Type 2 diabetes mellitus without complications: Secondary | ICD-10-CM

## 2017-10-26 MED ORDER — PEN NEEDLES 32G X 6 MM MISC: 1.0000 | Freq: Every day | 3 refills | Status: DC

## 2017-10-31 ENCOUNTER — Ambulatory Visit (INDEPENDENT_AMBULATORY_CARE_PROVIDER_SITE_OTHER): Payer: Medicare Other | Admitting: Podiatry

## 2017-10-31 ENCOUNTER — Encounter: Payer: Self-pay | Admitting: Podiatry

## 2017-10-31 DIAGNOSIS — M779 Enthesopathy, unspecified: Secondary | ICD-10-CM

## 2017-10-31 DIAGNOSIS — M722 Plantar fascial fibromatosis: Secondary | ICD-10-CM

## 2017-10-31 DIAGNOSIS — M79676 Pain in unspecified toe(s): Secondary | ICD-10-CM | POA: Diagnosis not present

## 2017-10-31 DIAGNOSIS — M7752 Other enthesopathy of left foot: Secondary | ICD-10-CM

## 2017-10-31 DIAGNOSIS — B351 Tinea unguium: Secondary | ICD-10-CM

## 2017-11-02 NOTE — Progress Notes (Signed)
   SUBJECTIVE 72 year old female with PMHx of DM presenting today with a chief complaint of stabbing pain to the right hallux that began one month ago. She has been taking OTC Tylenol to treat the pain. Walking increases the pain.  She is also complaining of elongated, thickened nails that cause pain while ambulating in shoes. She is unable to trim her own nails. Patient is here for further evaluation and treatment.   Past Medical History:  Diagnosis Date  . Blood clotting disorder ([place])   . Cardiac murmur   . Carotid bruit    US carotid had 09/03/14 [facility] [place]   . Carpal tunnel syndrome   . Cervical radiculopathy    improved since surgery 2016   . Chicken pox   . 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 Loann Quill [facility]   . Degenerative disk disease   . Diabetes ([place])   . DVT, lower extremity ([place])    left, 05/2013 on Xarelto x 1 month etiology unkown   . GERD (gastroesophageal reflux disease)   . HSV-2 infection   . Hyperlipidemia   . Hypertension   . Insomnia   . Intertrigo   . Lumbar radiculopathy    improved after steroid inj and PT  . Peripheral vascular disease ([place])   . Subdeltoid bursitis   . Vitamin D deficiency     OBJECTIVE General Patient is awake, alert, and oriented x 3 and in no acute distress. Derm Skin is dry and supple bilateral. Negative open lesions or macerations. Remaining integument unremarkable. Nails are tender, long, thickened and dystrophic with subungual debris, consistent with onychomycosis, 1-5 bilateral. No signs of infection noted. Vasc  DP and PT pedal pulses palpable bilaterally. Temperature gradient within normal limits.  Neuro Epicritic and protective threshold sensation diminished bilaterally.  Musculoskeletal Exam Tenderness to palpation to the plantar aspect of the right heel along the plantar fascia. Pain with palpation to the IPJ of the left hallux. No symptomatic pedal deformities  noted bilateral. Muscular strength within normal limits.  ASSESSMENT 1. Diabetes Mellitus w/ peripheral neuropathy 2. Onychomycosis of nail due to dermatophyte bilateral 3. Pain in foot bilateral 4. Plantar fasciitis right 5. Capsulitis left hallux  PLAN OF CARE 1. Patient evaluated today. 2. Instructed to maintain good pedal hygiene and foot care. Stressed importance of controlling blood sugar.  3. Mechanical debridement of nails 1-5 bilaterally performed using a nail nipper. Filed with dremel without incident.  4. Injection of 0.5 mLs Celestone Soluspan injected into the plantar fascia of the right foot.  5. Injection of 0.5 mLs Celestone Soluspan injected into the IPJ of the left hallux.  6. Continue taking OTC Tylenol for treatment.  7. Return to clinic in 3 mos.     Edrick Kins, DPM [facility]  Dr. Edrick Kins, [address]                                        [place], [place] [address]                Office [phone]  Fax [phone]

## 2017-11-05 ENCOUNTER — Other Ambulatory Visit: Payer: Self-pay | Admitting: Family Medicine

## 2017-12-07 ENCOUNTER — Other Ambulatory Visit: Payer: Self-pay | Admitting: Family Medicine

## 2017-12-11 ENCOUNTER — Telehealth: Payer: Self-pay | Admitting: Internal Medicine

## 2017-12-11 NOTE — Telephone Encounter (Signed)
Please call patient vitamin D3 can buy OTC now 5000 IU daily  [place]

## 2017-12-14 ENCOUNTER — Ambulatory Visit: Payer: Medicare Other

## 2017-12-19 NOTE — Telephone Encounter (Signed)
Patient informed with lab results

## 2017-12-20 ENCOUNTER — Ambulatory Visit (INDEPENDENT_AMBULATORY_CARE_PROVIDER_SITE_OTHER): Payer: Medicare Other

## 2017-12-20 ENCOUNTER — Other Ambulatory Visit (INDEPENDENT_AMBULATORY_CARE_PROVIDER_SITE_OTHER): Payer: Medicare Other

## 2017-12-20 DIAGNOSIS — E559 Vitamin D deficiency, unspecified: Secondary | ICD-10-CM

## 2017-12-20 DIAGNOSIS — E119 Type 2 diabetes mellitus without complications: Secondary | ICD-10-CM | POA: Diagnosis not present

## 2017-12-20 DIAGNOSIS — Z23 Encounter for immunization: Secondary | ICD-10-CM | POA: Diagnosis not present

## 2017-12-20 DIAGNOSIS — Z794 Long term (current) use of insulin: Secondary | ICD-10-CM | POA: Diagnosis not present

## 2017-12-20 DIAGNOSIS — I10 Essential (primary) hypertension: Secondary | ICD-10-CM

## 2017-12-20 LAB — CBC WITH DIFFERENTIAL/PLATELET
BASOS PCT: 0.3 % (ref 0.0–3.0)
Basophils Absolute: 0 10*3/uL (ref 0.0–0.1)
EOS ABS: 0.1 10*3/uL (ref 0.0–0.7)
EOS PCT: 1.6 % (ref 0.0–5.0)
HEMATOCRIT: 37.8 % (ref 36.0–46.0)
HEMOGLOBIN: 12.2 g/dL (ref 12.0–15.0)
Lymphocytes Relative: 38.6 % (ref 12.0–46.0)
Lymphs Abs: 3.2 10*3/uL (ref 0.7–4.0)
MCHC: 32.4 g/dL (ref 30.0–36.0)
MCV: 83.3 fl (ref 78.0–100.0)
MONO ABS: 0.6 10*3/uL (ref 0.1–1.0)
Monocytes Relative: 6.7 % (ref 3.0–12.0)
NEUTROS ABS: 4.4 10*3/uL (ref 1.4–7.7)
Neutrophils Relative %: 52.8 % (ref 43.0–77.0)
PLATELETS: 171 10*3/uL (ref 150.0–400.0)
RBC: 4.54 Mil/uL (ref 3.87–5.11)
RDW: 15.8 % — AB (ref 11.5–15.5)
WBC: 8.3 10*3/uL (ref 4.0–10.5)

## 2017-12-20 LAB — LIPID PANEL
Cholesterol: 163 mg/dL (ref 0–200)
HDL: 59.2 mg/dL (ref >39.00)
LDL CALC: 90 mg/dL (ref 0–99)
NONHDL: 103.84
Total CHOL/HDL Ratio: 3
Triglycerides: 69 mg/dL (ref 0.0–149.0)
VLDL: 13.8 mg/dL (ref 0.0–40.0)

## 2017-12-20 LAB — BASIC METABOLIC PANEL
BUN: 17 mg/dL (ref 6–23)
CALCIUM: 9.4 mg/dL (ref 8.4–10.5)
CO2: 25 mEq/L (ref 19–32)
Chloride: 105 mEq/L (ref 96–112)
Creatinine, Ser: 1.1 mg/dL (ref 0.40–1.20)
GFR: 62.73 mL/min (ref >60.00)
Glucose, Bld: 119 mg/dL — ABNORMAL HIGH (ref 70–99)
POTASSIUM: 4 meq/L (ref 3.5–5.1)
SODIUM: 140 meq/L (ref 135–145)

## 2017-12-20 LAB — VITAMIN D 25 HYDROXY (VIT D DEFICIENCY, FRACTURES): VITD: 82.71 ng/mL (ref 30.00–100.00)

## 2017-12-20 LAB — HEMOGLOBIN A1C: HEMOGLOBIN A1C: 7.8 % — AB (ref 4.6–6.5)

## 2017-12-20 NOTE — Progress Notes (Signed)
Patient comes in for Hepatitis B #3  injection. Injected right deltoid . Patient tolerated injection well.

## 2018-01-02 ENCOUNTER — Ambulatory Visit (INDEPENDENT_AMBULATORY_CARE_PROVIDER_SITE_OTHER): Payer: Medicare Other

## 2018-01-02 ENCOUNTER — Encounter: Payer: Self-pay | Admitting: Internal Medicine

## 2018-01-02 ENCOUNTER — Ambulatory Visit: Payer: Medicare Other | Admitting: Internal Medicine

## 2018-01-02 VITALS — BP 130/76 | HR 87 | Temp 98.7°F | Resp 14 | Ht 61.0 in | Wt 171.0 lb

## 2018-01-02 VITALS — BP 130/76 | HR 87 | Temp 98.7°F | Ht 61.0 in | Wt 171.2 lb

## 2018-01-02 DIAGNOSIS — R269 Unspecified abnormalities of gait and mobility: Secondary | ICD-10-CM

## 2018-01-02 DIAGNOSIS — N183 Chronic kidney disease, stage 3 (moderate): Secondary | ICD-10-CM

## 2018-01-02 DIAGNOSIS — I10 Essential (primary) hypertension: Secondary | ICD-10-CM

## 2018-01-02 DIAGNOSIS — Z0000 Encounter for general adult medical examination without abnormal findings: Secondary | ICD-10-CM

## 2018-01-02 DIAGNOSIS — Z1231 Encounter for screening mammogram for malignant neoplasm of breast: Secondary | ICD-10-CM

## 2018-01-02 DIAGNOSIS — E1122 Type 2 diabetes mellitus with diabetic chronic kidney disease: Secondary | ICD-10-CM | POA: Diagnosis not present

## 2018-01-02 NOTE — Progress Notes (Signed)
Subjective:   Natalie Watts is a 72 y.o. female who presents for an Initial Medicare Annual Wellness Visit.  Review of Systems    No ROS.  Medicare Wellness Visit. Additional risk factors are reflected in the social history. Cardiac Risk Factors include: advanced age (>22men, >65 women);obesity (BMI >30kg/m2);hypertension;diabetes mellitus     Objective:    Today's Vitals   01/02/18 0922  BP: 130/76  Pulse: 87  Resp: 14  Temp: 98.7 F (37.1 C)  TempSrc: Oral  SpO2: 94%  Weight: 171 lb (77.6 kg)  Height: 5\' 1"  (1.549 m)   Body mass index is 32.31 kg/m.  Advanced Directives 01/02/2018 09/11/2017 11/07/2014  Does Patient Have a Medical Advance Directive? Yes No No  Type of Advance Directive [place] - -  Does patient want to make changes to medical advance directive? No - Patient declined - -  Copy of [place] in Chart? No - copy requested - -  Would patient like information on creating a medical advance directive? - Yes (MAU/Ambulatory/Procedural Areas - Information given) No - patient declined information    Current Medications (verified) Outpatient Encounter Medications as of 01/02/2018  Medication Sig  . acetaminophen (TYLENOL) 650 MG CR tablet Take 650 mg by mouth every 8 (eight) hours as needed.   Marland Kitchen amLODipine (NORVASC) 10 MG tablet amlodipine besylate 10 mg tabs  . aspirin EC 81 MG tablet Take 81 mg by mouth daily.   . Cholecalciferol 50000 units capsule Take 1 capsule (50,000 Units total) by mouth once a week.  . clotrimazole (LOTRIMIN) 1 % cream   . EPIPEN 2-PAK 0.3 MG/0.3ML SOAJ injection   . glucose blood test strip Use 4 times daily before meals.  . hydrocortisone 2.5 % cream Apply 1 application topically 2 (two) times daily as needed.   . Insulin Pen Needle (PEN NEEDLES) 32G X 6 MM MISC 1 Device by Does not apply route daily. E11.9  . LANTUS SOLOSTAR 100 UNIT/ML Solostar Pen Inject 53 Units into the skin daily after  breakfast.  . loratadine (CLARITIN) 10 MG tablet TAKE 1 TABLET BY MOUTH EVERY DAY  . nystatin cream (MYCOSTATIN) Apply 1,000,000 Units topically as needed.  Glory Rosebush DELICA LANCETS FINE MISC by Does not apply route.  . Powders (GOLD BOND BABY POWDER EX) Apply 1 application topically as needed.   . pravastatin (PRAVACHOL) 40 MG tablet Take 40 mg by mouth daily.   . sitaGLIPtin (JANUVIA) 100 MG tablet Take 1 tablet (100 mg total) by mouth daily.  . valACYclovir (VALTREX) 500 MG tablet Take 1 tablet (500 mg total) by mouth 2 (two) times daily. Bid x 3 days with outbreak 24 hours before.   No facility-administered encounter medications on file as of 01/02/2018.     Allergies (verified) Lisinopril; Sulfamethoxazole-trimethoprim; Metformin and related; Penicillins; and Tramadol   History: Past Medical History:  Diagnosis Date  . 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 Cardiac murmur   . Carotid bruit    US carotid had 09/03/14 [facility] [place]   . Carpal tunnel syndrome   . Cervical radiculopathy    improved since surgery 2016   . Chicken pox   . 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 Loann Quill [facility]   . Degenerative disk disease   . Diabetes ([place])   . DVT, lower extremity ([place])    left, 05/2013 on Xarelto x 1 month etiology unkown   .  GERD (gastroesophageal reflux disease)   . HSV-2 infection   . Hyperlipidemia   . Hypertension   . Insomnia   . Intertrigo   . Lumbar radiculopathy    improved after steroid inj and PT  . Peripheral vascular disease ([place])   . Subdeltoid bursitis   . Vitamin D deficiency    Past Surgical History:  Procedure Laterality Date  . 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 Sheppard Evens  . COLON SURGERY    . TUBAL LIGATION     Family History  Problem Relation Age of Onset  . Hypertension Mother   . Arthritis Mother   . Cancer Father        colon cancer dx'ed died age 45  . Hypertension Sister     . Arthritis Sister   . Cancer Sister        died in 69s brain tumor h/o lung cancer smoker died 2015/10/08   . Early death Sister   . Breast cancer Maternal Grandmother   . Cancer Maternal Grandmother        breast dx older age   . Hypertension Maternal Grandmother   . Breast cancer Other        maternal neice  . Cancer Other        died in 30s   . Hypertension Daughter   . Hypertension Daughter    Social History   Socioeconomic History  . Marital status: Divorced    Spouse name: Not on file  . Number of children: Not on file  . Years of education: Not on file  . Highest education level: Not on file  Occupational History  . Not on file  Social Needs  . Financial resource strain: Not hard at all  . Food insecurity:    Worry: Never true    Inability: Never true  . Transportation needs:    Medical: No    Non-medical: No  Tobacco Use  . Smoking status: Former Smoker    Packs/day: 1.00    Years: 45.00    Pack years: 45.00    Types: Cigarettes    Last attempt to quit: 12/18/2012    Years since quitting: 5.0  . Smokeless tobacco: Never Used  Substance and Sexual Activity  . Alcohol use: No  . Drug use: Yes    Frequency: 3.0 times per week  . Sexual activity: Not on file  Lifestyle  . Physical activity:    Days per week: 5 days    Minutes per session: 60 min  . Stress: Not at all  Relationships  . Social connections:    Talks on phone: Not on file    Gets together: Not on file    Attends religious service: Not on file    Active member of club or organization: Not on file    Attends meetings of clubs or organizations: Not on file    Relationship status: Not on file  Other Topics Concern  . Not on file  Social History Narrative   Divorced    2 daughters [name] comes to most appts and is Chief Executive Officer    -lives with daughter [name]    Used to work for [employer] now [employer]    Former smoker age 48 to age 72/2012 quit for sure in 10/07/2012 max 1/2 ppd FH lung cancer  sister also smoker     Tobacco Counseling Counseling given: Not Answered   Clinical Intake:  Pre-visit preparation completed: Yes  Pain : No/denies pain     Nutritional Status: BMI > 30  Obese Diabetes: Yes(Followed by pcp)  How often do you need to have someone help you when you read instructions, pamphlets, or other written materials from your doctor or pharmacy?: 1 - Never  Interpreter Needed?: No      Activities of Daily Living In your present state of health, do you have any difficulty performing the following activities: 01/02/2018  Hearing? N  Vision? N  Difficulty concentrating or making decisions? N  Walking or climbing stairs? N  Dressing or bathing? N  Doing errands, shopping? N  Preparing Food and eating ? N  Using the Toilet? N  In the past six months, have you accidently leaked urine? Y  Comment Managed with a daily brief or pad   Do you have problems with loss of bowel control? N  Managing your Medications? N  Managing your Finances? N  Housekeeping or managing your Housekeeping? N  Some recent data might be hidden     Immunizations and Health Maintenance Immunization History  Administered Date(s) Administered  . Hepatitis B, adult 06/15/2017, 08/17/2017, 12/20/2017  . Influenza, High Dose Seasonal PF 04/07/2017  . Influenza,inj,Quad PF,6+ Mos 04/09/2013  . Influenza-Unspecified 04/09/2013  . Pneumococcal Conjugate-13 10/17/2017  . Pneumococcal Polysaccharide-23 11/27/2008  . Td 02/14/2011   There are no preventive care reminders to display for this patient.  Patient Care Team: McLean-Scocuzza, Nino Glow, MD as PCP - General (Internal Medicine)  Indicate any recent Medical Services you may have received from other than [facility] providers in the past year (date may be approximate).     Assessment:   This is a routine wellness examination for [place].  The goal of the wellness visit is to assist the patient how to close the gaps in care and create  a preventative care plan for the patient.   The roster of all physicians providing medical care to patient is listed in the Snapshot section of the chart.  Taking calcium VIT D as appropriate/Osteoporosis risk reviewed.    Safety issues reviewed; Smoke and carbon monoxide detectors in the home. No firearms in the home. Wears seatbelts when driving or riding with others. No violence in the home.  They do not have excessive sun exposure.  Discussed the need for sun protection: hats, long sleeves and the use of sunscreen if there is significant sun exposure.  Patient is alert, normal appearance, oriented to person/place/and time. Correctly identified the president of the [place] and recalls of 3/3 words. Performs simple calculations and can read correct time from watch face.Displays appropriate judgement.  No new identified risk were noted.  No failures at ADL's or IADL's.  Ambulates with cane as needed.   BMI- discussed the importance of a healthy diet, water intake and the benefits of aerobic exercise. Educational material provided.   24 hour diet recall: Low carb diet  Dental- dentures.   Sleep patterns- Sleeps 6 hours at night.  Wakes feeling rested.   Diabetes- followed by pcp.  Today's BS 114.   Health maintenance gaps- closed.  Patient Concerns: None at this time. Follow up with PCP as needed.  Hearing/Vision screen Hearing Screening Comments: Patient is able to hear conversational tones without difficulty.  No issues reported.   Vision Screening Comments: [facility] Wears corrective lenses No history of retinopathy reported She plans to schedule an eye exam with her ophthalmologist for a diabetic exam  Dietary issues and exercise activities  discussed: Current Exercise Habits: Home exercise routine, Type of exercise: walking;stretching(Chair to standing exercises), Time (Minutes): 60, Frequency (Times/Week): 5, Weekly Exercise (Minutes/Week):  300, Intensity: Mild  Goals    . Healthy Lifestyle     Stay active, exercise Stay hydrated, drink plenty of fluids Healthy diet; low carb Monitor sugar intake      Depression Screen PHQ 2/9 Scores 01/02/2018 10/17/2017 09/11/2017 08/17/2017 06/15/2017 11/07/2014  PHQ - 2 Score 0 0 0 0 0 1    Fall Risk Fall Risk  01/02/2018 10/17/2017 09/25/2017 09/11/2017 08/17/2017  Falls in the past year? No No No No No   Cognitive Function: MMSE - Mini Mental State Exam 01/02/2018  Orientation to time 5  Orientation to Place 5  Registration 3  Attention/ Calculation 5  Recall 3  Language- name 2 objects 2  Language- repeat 1  Language- follow 3 step command 3  Language- read & follow direction 1  Write a sentence 1  Copy design 1  Total score 30        Screening Tests Health Maintenance  Topic Date Due  . INFLUENZA VACCINE  01/18/2018  . OPHTHALMOLOGY EXAM  02/16/2018  . HEMOGLOBIN A1C  06/22/2018  . URINE MICROALBUMIN  09/14/2018  . PNA vac Low Risk Adult (2 of 2 - PPSV23) 10/18/2018  . FOOT EXAM  11/01/2018  . MAMMOGRAM  02/09/2019  . TETANUS/TDAP  02/13/2021  . COLONOSCOPY  12/17/2024  . DEXA SCAN  Completed  . Hepatitis C Screening  Completed      Plan:    End of life planning; Advance aging; Advanced directives discussed. Copy of current HCPOA/Living Will requested.    I have personally reviewed and noted the following in the patient's chart:   . Medical and social history . Use of alcohol, tobacco or illicit drugs  . Current medications and supplements . Functional ability and status . Nutritional status . Physical activity . Advanced directives . List of other physicians . Hospitalizations, surgeries, and ER visits in previous 12 months . Vitals . Screenings to include cognitive, depression, and falls . Referrals and appointments  In addition, I have reviewed and discussed with patient certain preventive protocols, quality metrics, and best practice  recommendations. A written personalized care plan for preventive services as well as general preventive health recommendations were provided to patient.     Varney Biles, LPN   [id number]

## 2018-01-02 NOTE — Patient Instructions (Addendum)
  Natalie Watts , Thank you for taking time to come for your Medicare Wellness Visit. I appreciate your ongoing commitment to your health goals. Please review the following plan we discussed and let me know if I can assist you in the future.   Follow up as needed.    Bring a copy of your [place] and/or Living Will to be scanned into chart.  Have a great day!  These are the goals we discussed: Goals    . Healthy Lifestyle     Stay active, exercise Stay hydrated, drink plenty of fluids Healthy diet; low carb Monitor sugar intake       This is a list of the screening recommended for you and due dates:  Health Maintenance  Topic Date Due  . Flu Shot  01/18/2018  . Eye exam for diabetics  02/16/2018  . Hemoglobin A1C  06/22/2018  . Urine Protein Check  09/14/2018  . Pneumonia vaccines (2 of 2 - PPSV23) 10/18/2018  . Complete foot exam   11/01/2018  . Mammogram  02/09/2019  . Tetanus Vaccine  02/13/2021  . Colon Cancer Screening  12/17/2024  . DEXA scan (bone density measurement)  Completed  .  Hepatitis C: One time screening is recommended by Center for Disease Control  (CDC) for  adults born from [birth year] through 1965.   Completed

## 2018-01-02 NOTE — Patient Instructions (Signed)
Take calcium 600 mg daily  Take D3 1000 IU daily  Try Tumeric or Glucosamine Chondrotin for your bones  Think about CT chest screening for lung cancer with history of smoking   Arthritis Arthritis is a term that is commonly used to refer to joint pain or joint disease. There are more than 100 types of arthritis. What are the causes? The most common cause of this condition is wear and tear of a joint. Other causes include:  Gout.  Inflammation of a joint.  An infection of a joint.  Sprains and other injuries near the joint.  A drug reaction or allergic reaction.  In some cases, the cause may not be known. What are the signs or symptoms? The main symptom of this condition is pain in the joint with movement. Other symptoms include:  Redness, swelling, or stiffness at a joint.  Warmth coming from the joint.  Fever.  Overall feeling of illness.  How is this diagnosed? This condition may be diagnosed with a physical exam and tests, including:  Blood tests.  Urine tests.  Imaging tests, such as MRI, X-rays, or a CT scan.  Sometimes, fluid is removed from a joint for testing. How is this treated? Treatment for this condition may involve:  Treatment of the cause, if it is known.  Rest.  Raising (elevating) the joint.  Applying cold or hot packs to the joint.  Medicines to improve symptoms and reduce inflammation.  Injections of a steroid such as cortisone into the joint to help reduce pain and inflammation.  Depending on the cause of your arthritis, you may need to make lifestyle changes to reduce stress on your joint. These changes may include exercising more and losing weight. Follow these instructions at home: Medicines  Take over-the-counter and prescription medicines only as told by your health care provider.  Do not take aspirin to relieve pain if gout is suspected. Activity  Rest your joint if told by your health care provider. Rest is important when  your disease is active and your joint feels painful, swollen, or stiff.  Avoid activities that make the pain worse. It is important to balance activity with rest.  Exercise your joint regularly with range-of-motion exercises as told by your health care provider. Try doing low-impact exercise, such as: ? Swimming. ? Water aerobics. ? Biking. ? Walking. Joint Care   If your joint is swollen, keep it elevated if told by your health care provider.  If your joint feels stiff in the morning, try taking a warm shower.  If directed, apply heat to the joint. If you have diabetes, do not apply heat without permission from your health care provider. ? Put a towel between the joint and the hot pack or heating pad. ? Leave the heat on the area for 20-30 minutes.  If directed, apply ice to the joint: ? Put ice in a plastic bag. ? Place a towel between your skin and the bag. ? Leave the ice on for 20 minutes, 2-3 times per day.  Keep all follow-up visits as told by your health care provider. This is important. Contact a health care provider if:  The pain gets worse.  You have a fever. Get help right away if:  You develop severe joint pain, swelling, or redness.  Many joints become painful and swollen.  You develop severe back pain.  You develop severe weakness in your leg.  You cannot control your bladder or bowels. This information is not intended to  replace advice given to you by your health care provider. Make sure you discuss any questions you have with your health care provider. Document Released: 07/14/2004 Document Revised: 11/12/2015 Document Reviewed: 09/01/2014 Elsevier Interactive Patient Education  [employer].

## 2018-01-02 NOTE — Progress Notes (Signed)
Chief Complaint  Patient presents with  . Follow-up   F/u doing well  1. DM 2 A1C 7.8 12/20/17 doing well on lantus 53 units januvia 100  2. HTN BP controlled on norvasc 10  3. Fall since last visit   Review of Systems  Constitutional: Negative for weight loss.  HENT: Negative for hearing loss.   Eyes: Negative for blurred vision.  Respiratory: Negative for shortness of breath.   Cardiovascular: Negative for chest pain.  Gastrointestinal: Negative for abdominal pain.  Musculoskeletal: Positive for joint pain.  Skin: Negative for rash.  Neurological: Negative for headaches.  Psychiatric/Behavioral: Negative for depression.   Past Medical History:  Diagnosis Date  . Blood clotting disorder ([place])   . Cardiac murmur   . Carotid bruit    US carotid had 09/03/14 [facility] [place]   . Carpal tunnel syndrome   . Cervical radiculopathy    improved since surgery 2014/09/22   . Chicken pox   . 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 Loann Quill [facility]   . Degenerative disk disease   . Diabetes ([place])   . DVT, lower extremity ([place])    left, 05/2013 on Xarelto x 1 month etiology unkown   . GERD (gastroesophageal reflux disease)   . HSV-2 infection   . Hyperlipidemia   . Hypertension   . Insomnia   . Intertrigo   . Lumbar radiculopathy    improved after steroid inj and PT  . Peripheral vascular disease ([place])   . Subdeltoid bursitis   . Vitamin D deficiency    Past Surgical History:  Procedure Laterality Date  . 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 Sheppard Evens  . COLON SURGERY    . TUBAL LIGATION     Family History  Problem Relation Age of Onset  . Hypertension Mother   . Arthritis Mother   . Cancer Father        colon cancer dx'ed died age 87  . Hypertension Sister   . Arthritis Sister   . Cancer Sister        brain tumor h/o lung cancer smoker died 09-22-15   . Early death Sister   . Breast cancer Maternal Grandmother    . Cancer Maternal Grandmother        breast dx older age   . Hypertension Maternal Grandmother   . Breast cancer Other        maternal neice  . Hypertension Daughter   . Hypertension Daughter    Social History   Socioeconomic History  . Marital status: Divorced    Spouse name: Not on file  . Number of children: Not on file  . Years of education: Not on file  . Highest education level: Not on file  Occupational History  . Not on file  Social Needs  . Financial resource strain: Not on file  . Food insecurity:    Worry: Not on file    Inability: Not on file  . Transportation needs:    Medical: Not on file    Non-medical: Not on file  Tobacco Use  . Smoking status: Former Smoker    Packs/day: 1.00    Years: 45.00    Pack years: 45.00    Types: Cigarettes    Last attempt to quit: 12/18/2012    Years since quitting: 5.0  . Smokeless tobacco: Never Used  Substance and Sexual Activity  . Alcohol use: No  .  Drug use: Yes    Frequency: 3.0 times per week  . Sexual activity: Not on file  Lifestyle  . Physical activity:    Days per week: Not on file    Minutes per session: Not on file  . Stress: Not on file  Relationships  . Social connections:    Talks on phone: Not on file    Gets together: Not on file    Attends religious service: Not on file    Active member of club or organization: Not on file    Attends meetings of clubs or organizations: Not on file    Relationship status: Not on file  . Intimate partner violence:    Fear of current or ex partner: Not on file    Emotionally abused: Not on file    Physically abused: Not on file    Forced sexual activity: Not on file  Other Topics Concern  . Not on file  Social History Narrative   Divorced    2 daughters [name] comes to most appts and is Chief Executive Officer    Used to work for [employer] now [employer]    Former smoker age 8 to age 72/2012 quit for sure in 2014 max 1/2 ppd FH lung cancer sister also smoker     Current Meds  Medication Sig  . acetaminophen (TYLENOL) 650 MG CR tablet Take 650 mg by mouth every 8 (eight) hours as needed.   Marland Kitchen amLODipine (NORVASC) 10 MG tablet amlodipine besylate 10 mg tabs  . aspirin EC 81 MG tablet Take 81 mg by mouth daily.   . Cholecalciferol 50000 units capsule Take 1 capsule (50,000 Units total) by mouth once a week.  . clotrimazole (LOTRIMIN) 1 % cream   . EPIPEN 2-PAK 0.3 MG/0.3ML SOAJ injection   . glucose blood test strip Use 4 times daily before meals.  . hydrocortisone 2.5 % cream Apply 1 application topically 2 (two) times daily as needed.   . Insulin Pen Needle (PEN NEEDLES) 32G X 6 MM MISC 1 Device by Does not apply route daily. E11.9  . LANTUS SOLOSTAR 100 UNIT/ML Solostar Pen Inject 53 Units into the skin daily after breakfast.  . loratadine (CLARITIN) 10 MG tablet TAKE 1 TABLET BY MOUTH EVERY DAY  . nystatin cream (MYCOSTATIN) Apply 1,000,000 Units topically as needed.  Glory Rosebush DELICA LANCETS FINE MISC by Does not apply route.  . Powders (GOLD BOND BABY POWDER EX) Apply 1 application topically as needed.   . pravastatin (PRAVACHOL) 40 MG tablet Take 40 mg by mouth daily.   . sitaGLIPtin (JANUVIA) 100 MG tablet Take 1 tablet (100 mg total) by mouth daily.  . valACYclovir (VALTREX) 500 MG tablet Take 1 tablet (500 mg total) by mouth 2 (two) times daily. Bid x 3 days with outbreak 24 hours before.   Allergies  Allergen Reactions  . Lisinopril Anaphylaxis and Swelling    Tongue swelling Tongue swelling Tongue swelling Tongue swelling Tongue swelling Tongue swelling Tongue swelling Tongue swelling Tongue swelling Tongue swelling Tongue swelling Tongue swelling  . Sulfamethoxazole-Trimethoprim Other (See Comments)    Other reaction(s): Other (See Comments)  . Metformin And Related     GI sx's   . Penicillins     Childhood  . Tramadol Nausea Only and Nausea And Vomiting   Recent Results (from the past 2160 hour(s))  Vitamin D (25  hydroxy)     Status: None   Collection Time: 12/20/17  8:07 AM  Result Value Ref  Range   VITD 82.71 30.00 - 100.00 ng/mL  CBC w/Diff     Status: Abnormal   Collection Time: 12/20/17  8:07 AM  Result Value Ref Range   WBC 8.3 4.0 - 10.5 K/uL   RBC 4.54 3.87 - 5.11 Mil/uL   Hemoglobin 12.2 12.0 - 15.0 g/dL   HCT 37.8 36.0 - 46.0 %   MCV 83.3 78.0 - 100.0 fl   MCHC 32.4 30.0 - 36.0 g/dL   RDW 15.8 (H) 11.5 - 15.5 %   Platelets 171.0 150.0 - 400.0 K/uL   Neutrophils Relative % 52.8 43.0 - 77.0 %   Lymphocytes Relative 38.6 12.0 - 46.0 %   Monocytes Relative 6.7 3.0 - 12.0 %   Eosinophils Relative 1.6 0.0 - 5.0 %   Basophils Relative 0.3 0.0 - 3.0 %   Neutro Abs 4.4 1.4 - 7.7 K/uL   Lymphs Abs 3.2 0.7 - 4.0 K/uL   Monocytes Absolute 0.6 0.1 - 1.0 K/uL   Eosinophils Absolute 0.1 0.0 - 0.7 K/uL   Basophils Absolute 0.0 0.0 - 0.1 K/uL  Hemoglobin A1c     Status: Abnormal   Collection Time: 12/20/17  8:07 AM  Result Value Ref Range   Hgb A1c MFr Bld 7.8 (H) 4.6 - 6.5 %    Comment: Glycemic Control Guidelines for People with Diabetes:Non Diabetic:  <6%Goal of Therapy: <7%Additional Action Suggested:  >8%   Lipid panel     Status: None   Collection Time: 12/20/17  8:07 AM  Result Value Ref Range   Cholesterol 163 0 - 200 mg/dL    Comment: ATP III Classification       Desirable:  < 200 mg/dL               Borderline High:  200 - 239 mg/dL          High:  > = 240 mg/dL   Triglycerides 69.0 0.0 - 149.0 mg/dL    Comment: Normal:  <150 mg/dLBorderline High:  150 - 199 mg/dL   HDL 59.20 >39.00 mg/dL   VLDL 13.8 0.0 - 40.0 mg/dL   LDL Cholesterol 90 0 - 99 mg/dL   Total CHOL/HDL Ratio 3     Comment:                Men          Women1/2 Average Risk     3.4          3.3Average Risk          5.0          4.42X Average Risk          9.6          7.13X Average Risk          15.0          11.0                       NonHDL 103.84     Comment: NOTE:  Non-HDL goal should be 30 mg/dL higher than  patient's LDL goal (i.e. LDL goal of < 70 mg/dL, would have non-HDL goal of < 100 mg/dL)  Basic Metabolic Panel (BMET)     Status: Abnormal   Collection Time: 12/20/17  8:07 AM  Result Value Ref Range   Sodium 140 135 - 145 mEq/L   Potassium 4.0 3.5 - 5.1 mEq/L   Chloride 105 96 - 112 mEq/L   CO2  25 19 - 32 mEq/L   Glucose, Bld 119 (H) 70 - 99 mg/dL   BUN 17 6 - 23 mg/dL   Creatinine, Ser 1.10 0.40 - 1.20 mg/dL   Calcium 9.4 8.4 - 10.5 mg/dL   GFR 62.73 >60.00 mL/min   Objective  Body mass index is 32.35 kg/m. Wt Readings from Last 3 Encounters:  01/02/18 171 lb 3.2 oz (77.7 kg)  10/17/17 169 lb 12.8 oz (77 kg)  10/09/17 169 lb 6.4 oz (76.8 kg)   Temp Readings from Last 3 Encounters:  01/02/18 98.7 F (37.1 C) (Oral)  10/17/17 98.7 F (37.1 C) (Oral)  10/09/17 98.6 F (37 C) (Oral)   BP Readings from Last 3 Encounters:  01/02/18 130/76  10/17/17 136/64  10/09/17 120/68   Pulse Readings from Last 3 Encounters:  01/02/18 87  10/17/17 96  10/09/17 83    Physical Exam  Constitutional: She is oriented to person, place, and time. Vital signs are normal. She appears well-developed and well-nourished. She is cooperative.  HENT:  Head: Normocephalic and atraumatic.  Mouth/Throat: Oropharynx is clear and moist and mucous membranes are normal.  Eyes: Pupils are equal, round, and reactive to light. Conjunctivae are normal.  Cardiovascular: Normal rate, regular rhythm and normal heart sounds.  Pulmonary/Chest: Effort normal and breath sounds normal.  Neurological: She is alert and oriented to person, place, and time. Gait normal.  Skin: Skin is warm, dry and intact.  Psychiatric: She has a normal mood and affect. Her speech is normal and behavior is normal. Judgment and thought content normal. Cognition and memory are normal.  Nursing note and vitals reviewed.   Assessment   1. DM2 A1C 7.8  2. HTN  3. Fall mechanical since last visit 4. HM Plan  1. Cont lantus 53 units  and januvia 100 Eye exam 01/2018  Foot exam had 10/2017 f/u in 3 months  2. Cont norvasc 10  3. Monitor  4.  -Had flu [facility] 03/2017 confirmed  Given3/3 hep B vaccines today  pna 23 vx had 11/27/08  prevnar had  Will disc shingrix in the future  Td had 02/14/11   apptGI Dr. Darien Ramus [phone] colonoscopy 01/2018   Consider CT chest with chronic smoking hx quit 2014 former smoker 20-30 years 1 pk lasts 2 days   Out of age window pap  mammo 02/14/17 neg referred today   DEXA 10/30/13 osteopenia will rec vit D50K weeklyand calcium 600 mg bid  TBST 10/09/17 neg 10/12/17     Provider: Dr. Olivia Mackie McLean-Scocuzza-Internal Medicine

## 2018-01-02 NOTE — Progress Notes (Signed)
Pre visit review using our clinic review tool, if applicable. No additional management support is needed unless otherwise documented below in the visit note. 

## 2018-01-03 ENCOUNTER — Telehealth: Payer: Self-pay | Admitting: Internal Medicine

## 2018-01-03 NOTE — Telephone Encounter (Signed)
Amlodipine refill Last Refilled by historical provider Last OV: 01/02/18 PCP: Dr. Terese Door [facility] [place] [facility] [address]

## 2018-01-03 NOTE — Telephone Encounter (Unsigned)
Copied from [place] [id number]. Topic: Quick Communication - See Telephone Encounter >> Jan 03, 2018 11:02 AM Hewitt Shorts wrote: Pt is needing a refill on amlodipine   [facility] [address]   Best number [phone]

## 2018-01-04 ENCOUNTER — Other Ambulatory Visit: Payer: Self-pay | Admitting: Internal Medicine

## 2018-01-04 DIAGNOSIS — I10 Essential (primary) hypertension: Secondary | ICD-10-CM

## 2018-01-04 MED ORDER — AMLODIPINE BESYLATE 10 MG PO TABS: 10.0000 mg | ORAL_TABLET | Freq: Every day | ORAL | 3 refills | Status: DC

## 2018-01-04 NOTE — Telephone Encounter (Signed)
Ok to fill Historical provider?

## 2018-02-02 ENCOUNTER — Ambulatory Visit: Payer: Medicare Other | Admitting: Podiatry

## 2018-02-09 ENCOUNTER — Other Ambulatory Visit: Payer: Self-pay | Admitting: Internal Medicine

## 2018-02-09 ENCOUNTER — Ambulatory Visit
Admission: RE | Admit: 2018-02-09 | Discharge: 2018-02-09 | Disposition: A | Discharge status: Home / Self Care | Payer: Medicare Other | Source: Ambulatory Visit | Attending: Internal Medicine | Admitting: Internal Medicine

## 2018-02-09 DIAGNOSIS — Z1231 Encounter for screening mammogram for malignant neoplasm of breast: Secondary | ICD-10-CM | POA: Diagnosis not present

## 2018-02-09 DIAGNOSIS — R928 Other abnormal and inconclusive findings on diagnostic imaging of breast: Secondary | ICD-10-CM

## 2018-02-12 ENCOUNTER — Ambulatory Visit: Payer: Medicare Other | Admitting: Internal Medicine

## 2018-02-12 ENCOUNTER — Ambulatory Visit (INDEPENDENT_AMBULATORY_CARE_PROVIDER_SITE_OTHER): Payer: Medicare Other

## 2018-02-12 ENCOUNTER — Encounter: Payer: Self-pay | Admitting: Internal Medicine

## 2018-02-12 VITALS — BP 124/76 | HR 91 | Temp 98.6°F | Ht 61.0 in | Wt 170.0 lb

## 2018-02-12 DIAGNOSIS — M25511 Pain in right shoulder: Secondary | ICD-10-CM | POA: Diagnosis not present

## 2018-02-12 DIAGNOSIS — N6310 Unspecified lump in the right breast, unspecified quadrant: Secondary | ICD-10-CM | POA: Diagnosis not present

## 2018-02-12 MED ORDER — MELOXICAM 7.5 MG PO TABS: 7.5000 mg | ORAL_TABLET | Freq: Every day | ORAL | 0 refills | Status: DC | PRN

## 2018-02-12 MED ORDER — PREDNISONE 20 MG PO TABS: 20.0000 mg | ORAL_TABLET | Freq: Every day | ORAL | 0 refills | Status: DC

## 2018-02-12 NOTE — Patient Instructions (Addendum)
Tylenol 500 mg up to 6 pills per day no more than 3000 mg total per day Mobic 7.5 mg daily  Prednisone 20 mg daily in am with food  Referred to [facility] ortho urgently Dr. Fredrik Cove today  Try Heat   Shoulder Pain Many things can cause shoulder pain, including:  An injury to the area.  Overuse of the shoulder.  Arthritis.  The source of the pain can be:  Inflammation.  An injury to the shoulder joint.  An injury to a tendon, ligament, or bone.  Follow these instructions at home: Take these actions to help with your pain:  Squeeze a soft ball or a foam pad as much as possible. This helps to keep the shoulder from swelling. It also helps to strengthen the arm.  Take over-the-counter and prescription medicines only as told by your health care provider.  If directed, apply ice to the area: ? Put ice in a plastic bag. ? Place a towel between your skin and the bag. ? Leave the ice on for 20 minutes, 2-3 times per day. Stop applying ice if it does not help with the pain.  If you were given a shoulder sling or immobilizer: ? Wear it as told. ? Remove it to shower or bathe. ? Move your arm as little as possible, but keep your hand moving to prevent swelling.  Contact a health care provider if:  Your pain gets worse.  Your pain is not relieved with medicines.  New pain develops in your arm, hand, or fingers. Get help right away if:  Your arm, hand, or fingers: ? Tingle. ? Become numb. ? Become swollen. ? Become painful. ? Turn white or blue. This information is not intended to replace advice given to you by your health care provider. Make sure you discuss any questions you have with your health care provider. Document Released: 03/16/2005 Document Revised: 01/31/2016 Document Reviewed: 09/29/2014 Elsevier Interactive Patient Education  [employer].

## 2018-02-12 NOTE — Progress Notes (Signed)
Pre visit review using our clinic review tool, if applicable. No additional management support is needed unless otherwise documented below in the visit note. 

## 2018-02-12 NOTE — Progress Notes (Signed)
Chief Complaint  Patient presents with  . 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 Pain worse with crossbody movements and raising arm over head. Pain right lateral upper arm and radiates down arm to right wrist. And c/o pain in right 3rd to 5th right fingeres. Pain worse with use. Pain feels like tooth ache. 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  Respiratory: Negative for shortness of breath.   Cardiovascular: Negative for chest pain.  Musculoskeletal: Positive for joint pain.   Past Medical History:  Diagnosis Date  . 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 Cardiac murmur   . Carotid bruit    US carotid had 09/03/14 [facility] [place]   . Carpal tunnel syndrome   . Cervical radiculopathy    improved since surgery Sep 25, 2014   . Chicken pox   . 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 Loann Quill [facility]   . Degenerative disk disease   . Diabetes ([place])   . DVT, lower extremity ([place])    left, 05/2013 on Xarelto x 1 month etiology unkown   . GERD (gastroesophageal reflux disease)   . HSV-2 infection   . Hyperlipidemia   . Hypertension   . Insomnia   . Intertrigo   . Lumbar radiculopathy    improved after steroid inj and PT  . Peripheral vascular disease ([place])   . Subdeltoid bursitis   . Vitamin D deficiency    Past Surgical History:  Procedure Laterality Date  . 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 Sheppard Evens  . COLON SURGERY    . TUBAL LIGATION     Family History  Problem Relation Age of Onset  . Hypertension Mother   . Arthritis Mother   . Cancer Father        colon cancer dx'ed died age 82  . Hypertension Sister   . Arthritis Sister   . Cancer Sister        died in 52s brain tumor  h/o lung cancer smoker died Sep 25, 2015   . Early death Sister   . Breast cancer Maternal Grandmother   . Cancer Maternal Grandmother        breast dx older age   . Hypertension Maternal Grandmother   . Breast cancer Other        maternal neice  . Cancer Other        died in 56s   . Hypertension Daughter   . Hypertension Daughter    Social History   Socioeconomic History  . Marital status: Divorced    Spouse name: Not on file  . Number of children: Not on file  . Years of education: Not on file  . Highest education level: Not on file  Occupational History  . Not on file  Social Needs  . Financial resource strain: Not hard at all  . Food insecurity:    Worry: Never true    Inability: Never true  . Transportation needs:    Medical: No    Non-medical: No  Tobacco Use  . Smoking status: Former Smoker    Packs/day: 1.00    Years: 45.00    Pack years: 45.00    Types: Cigarettes  Last attempt to quit: 12/18/2012    Years since quitting: 5.1  . Smokeless tobacco: Never Used  Substance and Sexual Activity  . Alcohol use: No  . Drug use: Yes    Frequency: 3.0 times per week  . Sexual activity: Not on file  Lifestyle  . Physical activity:    Days per week: 5 days    Minutes per session: 60 min  . Stress: Not at all  Relationships  . Social connections:    Talks on phone: Not on file    Gets together: Not on file    Attends religious service: Not on file    Active member of club or organization: Not on file    Attends meetings of clubs or organizations: Not on file    Relationship status: Not on file  . Intimate partner violence:    Fear of current or ex partner: Not on file    Emotionally abused: Not on file    Physically abused: Not on file    Forced sexual activity: Not on file  Other Topics Concern  . Not on file  Social History Narrative   Divorced    2 daughters [name] comes to most appts and is Chief Executive Officer    -lives with daughter [name]    Used to work for [employer] now [employer]    Former smoker age 62 to age 72/2012 quit for sure in 2014 max 1/2 ppd FH lung cancer sister also smoker    Current Meds  Medication Sig  . acetaminophen (TYLENOL) 650 MG CR tablet Take 650 mg by mouth every 8 (eight) hours as needed.   Marland Kitchen amLODipine (NORVASC) 10 MG tablet Take 1 tablet (10 mg total) by mouth daily.  Marland Kitchen aspirin EC 81 MG tablet Take 81 mg by mouth daily.   . Cholecalciferol 50000 units capsule Take 1 capsule (50,000 Units total) by mouth once a week.  . clotrimazole (LOTRIMIN) 1 % cream   . EPIPEN 2-PAK 0.3 MG/0.3ML SOAJ injection   . glucose blood test strip Use 4 times daily before meals.  . hydrocortisone 2.5 % cream Apply 1 application topically 2 (two) times daily as needed.   . Insulin Pen Needle (PEN NEEDLES) 32G X 6 MM MISC 1 Device by Does not apply route daily. E11.9  . LANTUS SOLOSTAR 100 UNIT/ML Solostar Pen Inject 53 Units into the skin daily after breakfast.  . loratadine (CLARITIN) 10 MG tablet TAKE 1 TABLET BY MOUTH EVERY DAY  . nystatin cream (MYCOSTATIN) Apply 1,000,000 Units topically as needed.  Glory Rosebush DELICA LANCETS FINE MISC by Does not apply route.  . Powders (GOLD BOND BABY POWDER EX) Apply 1 application topically as needed.   . pravastatin (PRAVACHOL) 40 MG tablet Take 40 mg by mouth daily.   . sitaGLIPtin (JANUVIA) 100 MG tablet Take 1 tablet (100 mg total) by mouth daily.  . valACYclovir (VALTREX) 500 MG tablet Take 1 tablet (500 mg total) by mouth 2 (two) times daily. Bid x 3 days with outbreak 24 hours before.   Allergies  Allergen Reactions  . Lisinopril Anaphylaxis and Swelling    Tongue swelling Tongue swelling Tongue swelling Tongue swelling Tongue swelling Tongue swelling Tongue swelling Tongue swelling Tongue swelling Tongue swelling Tongue swelling Tongue swelling  . Sulfamethoxazole-Trimethoprim Other (See Comments)    Other reaction(s): Other (See Comments)  . Metformin And Related     GI  sx's   . Penicillins     Childhood  .  Tramadol Nausea Only and Nausea And Vomiting   Recent Results (from the past 2160 hour(s))  Vitamin D (25 hydroxy)     Status: None   Collection Time: 12/20/17  8:07 AM  Result Value Ref Range   VITD 82.71 30.00 - 100.00 ng/mL  CBC w/Diff     Status: Abnormal   Collection Time: 12/20/17  8:07 AM  Result Value Ref Range   WBC 8.3 4.0 - 10.5 K/uL   RBC 4.54 3.87 - 5.11 Mil/uL   Hemoglobin 12.2 12.0 - 15.0 g/dL   HCT 37.8 36.0 - 46.0 %   MCV 83.3 78.0 - 100.0 fl   MCHC 32.4 30.0 - 36.0 g/dL   RDW 15.8 (H) 11.5 - 15.5 %   Platelets 171.0 150.0 - 400.0 K/uL   Neutrophils Relative % 52.8 43.0 - 77.0 %   Lymphocytes Relative 38.6 12.0 - 46.0 %   Monocytes Relative 6.7 3.0 - 12.0 %   Eosinophils Relative 1.6 0.0 - 5.0 %   Basophils Relative 0.3 0.0 - 3.0 %   Neutro Abs 4.4 1.4 - 7.7 K/uL   Lymphs Abs 3.2 0.7 - 4.0 K/uL   Monocytes Absolute 0.6 0.1 - 1.0 K/uL   Eosinophils Absolute 0.1 0.0 - 0.7 K/uL   Basophils Absolute 0.0 0.0 - 0.1 K/uL  Hemoglobin A1c     Status: Abnormal   Collection Time: 12/20/17  8:07 AM  Result Value Ref Range   Hgb A1c MFr Bld 7.8 (H) 4.6 - 6.5 %    Comment: Glycemic Control Guidelines for People with Diabetes:Non Diabetic:  <6%Goal of Therapy: <7%Additional Action Suggested:  >8%   Lipid panel     Status: None   Collection Time: 12/20/17  8:07 AM  Result Value Ref Range   Cholesterol 163 0 - 200 mg/dL    Comment: ATP III Classification       Desirable:  < 200 mg/dL               Borderline High:  200 - 239 mg/dL          High:  > = 240 mg/dL   Triglycerides 69.0 0.0 - 149.0 mg/dL    Comment: Normal:  <150 mg/dLBorderline High:  150 - 199 mg/dL   HDL 59.20 >39.00 mg/dL   VLDL 13.8 0.0 - 40.0 mg/dL   LDL Cholesterol 90 0 - 99 mg/dL   Total CHOL/HDL Ratio 3     Comment:                Men          Women1/2 Average Risk     3.4          3.3Average Risk          5.0          4.42X Average Risk          9.6           7.13X Average Risk          15.0          11.0                       NonHDL 103.84     Comment: NOTE:  Non-HDL goal should be 30 mg/dL higher than patient's LDL goal (i.e. LDL goal of < 70 mg/dL, would have non-HDL goal of < 100 mg/dL)  Basic Metabolic Panel (BMET)     Status: Abnormal  Collection Time: 12/20/17  8:07 AM  Result Value Ref Range   Sodium 140 135 - 145 mEq/L   Potassium 4.0 3.5 - 5.1 mEq/L   Chloride 105 96 - 112 mEq/L   CO2 25 19 - 32 mEq/L   Glucose, Bld 119 (H) 70 - 99 mg/dL   BUN 17 6 - 23 mg/dL   Creatinine, Ser 1.10 0.40 - 1.20 mg/dL   Calcium 9.4 8.4 - 10.5 mg/dL   GFR 62.73 >60.00 mL/min   Objective  Body mass index is 32.12 kg/m. Wt Readings from Last 3 Encounters:  02/12/18 170 lb (77.1 kg)  01/02/18 171 lb (77.6 kg)  01/02/18 171 lb 3.2 oz (77.7 kg)   Temp Readings from Last 3 Encounters:  02/12/18 98.6 F (37 C) (Oral)  01/02/18 98.7 F (37.1 C) (Oral)  01/02/18 98.7 F (37.1 C) (Oral)   BP Readings from Last 3 Encounters:  02/12/18 124/76  01/02/18 130/76  01/02/18 130/76   Pulse Readings from Last 3 Encounters:  02/12/18 91  01/02/18 87  01/02/18 87    Physical Exam  Constitutional: She is oriented to person, place, and time. Vital signs are normal. She appears well-developed and well-nourished. She is cooperative.  HENT:  Head: Normocephalic and atraumatic.  Mouth/Throat: Oropharynx is clear and moist and mucous membranes are normal.  Eyes: Pupils are equal, round, and reactive to light. Conjunctivae are normal.  Cardiovascular: Normal rate, regular rhythm and normal heart sounds.  Pulmonary/Chest: Effort normal and breath sounds normal.  Musculoskeletal:       Right shoulder: She exhibits decreased range of motion, tenderness and bony tenderness.  Neurological: She is alert and oriented to person, place, and time. Gait normal.  Skin: Skin is warm, dry and intact.  Psychiatric: She has a normal mood and affect. Her speech is normal  and behavior is normal. Judgment and thought content normal. Cognition and memory are normal.  Nursing note and vitals reviewed.   Assessment   1. Right shoulder pain ddx arthritis, bursitis h/o L shoulder bursitis, rotator cuff  Plan   1. Xray  Mobic, prednisone, tylenol Urgent referral Dr. Roland Rack steroid injection.  No heavy lifting more than 5 lbs   Provider: Dr. Olivia Mackie McLean-Scocuzza-Internal Medicine

## 2018-02-13 ENCOUNTER — Ambulatory Visit: Payer: Medicare Other | Admitting: Podiatry

## 2018-02-13 ENCOUNTER — Encounter: Payer: Self-pay | Admitting: Podiatry

## 2018-02-13 DIAGNOSIS — M79676 Pain in unspecified toe(s): Secondary | ICD-10-CM

## 2018-02-13 DIAGNOSIS — M779 Enthesopathy, unspecified: Secondary | ICD-10-CM

## 2018-02-13 DIAGNOSIS — B351 Tinea unguium: Secondary | ICD-10-CM

## 2018-02-13 DIAGNOSIS — M7752 Other enthesopathy of left foot: Secondary | ICD-10-CM

## 2018-02-15 NOTE — Progress Notes (Signed)
   SUBJECTIVE Patient with a history of diabetes mellitus presents to office today complaining of elongated, thickened nails that cause pain while ambulating in shoes. She is unable to trim her own nails.  She also reports burning, stinging pain in the hallux of the left foot. She states the pain resolved for a while after her last visit but returned a few weeks ago. She has not done anything for treatment. There are no modifying factors noted. Patient is here for further evaluation and treatment.   Past Medical History:  Diagnosis Date  . Blood clotting disorder ([place])   . Cardiac murmur   . Carotid bruit    US carotid had 09/03/14 [facility] [place]   . Carpal tunnel syndrome   . Cervical radiculopathy    improved since surgery 2016   . Chicken pox   . 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 Loann Quill [facility]   . Degenerative disk disease   . Diabetes ([place])   . DVT, lower extremity ([place])    left, 05/2013 on Xarelto x 1 month etiology unkown   . GERD (gastroesophageal reflux disease)   . HSV-2 infection   . Hyperlipidemia   . Hypertension   . Insomnia   . Intertrigo   . Lumbar radiculopathy    improved after steroid inj and PT  . Peripheral vascular disease ([place])   . Subdeltoid bursitis   . Vitamin D deficiency     OBJECTIVE General Patient is awake, alert, and oriented x 3 and in no acute distress. Derm Skin is dry and supple bilateral. Negative open lesions or macerations. Remaining integument unremarkable. Nails are tender, long, thickened and dystrophic with subungual debris, consistent with onychomycosis, 1-5 bilateral. No signs of infection noted. Vasc  DP and PT pedal pulses palpable bilaterally. Temperature gradient within normal limits.  Neuro Epicritic and protective threshold sensation diminished bilaterally.  Musculoskeletal Exam pain with palpation and with range of motion to the IPJ of the left hallux. No symptomatic  pedal deformities noted bilateral. Muscular strength within normal limits.  ASSESSMENT 1. Diabetes Mellitus w/ peripheral neuropathy 2. Onychomycosis of nail due to dermatophyte bilateral 3. Pain in foot bilateral 4. Left great toe capsulitis   PLAN OF CARE 1. Patient evaluated today. 2. Instructed to maintain good pedal hygiene and foot care. Stressed importance of controlling blood sugar.  3. Mechanical debridement of nails 1-5 bilaterally performed using a nail nipper. Filed with dremel without incident.  4. Injection of 0.5 mLs Celestone Soluspan injected into the IPJ of the left hallux.  5. Return to clinic in 3 mos.     Edrick Kins, DPM [facility]  Dr. Edrick Kins, [address]                                        [place], [place] [address]                Office [phone]  Fax [phone]

## 2018-02-20 ENCOUNTER — Ambulatory Visit
Admission: RE | Admit: 2018-02-20 | Discharge: 2018-02-20 | Disposition: A | Discharge status: Home / Self Care | Payer: Medicare Other | Source: Ambulatory Visit | Attending: Internal Medicine | Admitting: Internal Medicine

## 2018-02-20 DIAGNOSIS — N6310 Unspecified lump in the right breast, unspecified quadrant: Secondary | ICD-10-CM

## 2018-03-02 ENCOUNTER — Telehealth: Payer: Self-pay | Admitting: Internal Medicine

## 2018-03-02 DIAGNOSIS — E1165 Type 2 diabetes mellitus with hyperglycemia: Secondary | ICD-10-CM

## 2018-03-02 MED ORDER — LANTUS SOLOSTAR 100 UNIT/ML ~~LOC~~ SOPN: 53.0000 [IU] | PEN_INJECTOR | Freq: Every day | SUBCUTANEOUS | 2 refills | Status: DC

## 2018-03-02 NOTE — Telephone Encounter (Signed)
Copied from [place] [id number]. Topic: Quick Communication - Rx Refill/Question >> Mar 02, 2018  3:54 PM Percell Belt A wrote: Medication: LANTUS SOLOSTAR 100 UNIT/ML Solostar Pen [[id number]]   Has the patient contacted their pharmacy? No. (Agent: If no, request that the patient contact the pharmacy for the refill.) (Agent: If yes, when and what did the pharmacy advise?)  Preferred Pharmacy (with phone number or street name): [facility] [address] in [place]- pt is completely out and is worried she will not have this over the weekend.    Agent: Please be advised that RX refills may take up to 3 business days. We ask that you follow-up with your pharmacy.

## 2018-03-02 NOTE — Telephone Encounter (Signed)
Copied from [place] [id number]. Topic: Quick Communication - Rx Refill/Question >> Mar 02, 2018  3:54 PM Percell Belt A wrote: Medication: LANTUS SOLOSTAR 100 UNIT/ML Solostar Pen [[id number]]   Has the patient contacted their pharmacy yes  (Agent: If no, request that the patient contact the pharmacy for the refill.) (Agent: If yes, when and what did the pharmacy advise?)  Preferred Pharmacy (with phone number or street name): [facility] [address] in [place]- pt is completely out and is worried she will not have this over the weekend.    Agent: Please be advised that RX refills may take up to 3 business days. We ask that you follow-up with your pharmacy.

## 2018-03-02 NOTE — Telephone Encounter (Signed)
Refill sent as requested. 

## 2018-03-07 ENCOUNTER — Other Ambulatory Visit: Payer: Self-pay

## 2018-03-07 ENCOUNTER — Telehealth: Payer: Self-pay | Admitting: Internal Medicine

## 2018-03-07 MED ORDER — ONETOUCH DELICA LANCETS FINE MISC: 1.0000 | Freq: Four times a day (QID) | 3 refills | Status: DC | PRN

## 2018-03-07 NOTE — Telephone Encounter (Signed)
lancets have been ordered.

## 2018-03-07 NOTE — Telephone Encounter (Signed)
Copied from [place] [phone] [phone]. Topic: Quick Communication - Rx Refill/Question >> Mar 07, 2018 10:46 AM Oliver Pila B wrote: Medication: Lancets  Pt got a refill for Lantus instead of Lancets; pt is needing Lancets to check blood sugar; contact pt or pharmacy asap; pt would like a resolution today

## 2018-03-08 ENCOUNTER — Other Ambulatory Visit: Payer: Self-pay

## 2018-03-08 MED ORDER — ONETOUCH DELICA LANCETS FINE MISC: 1.0000 | Freq: Four times a day (QID) | 3 refills | Status: DC | PRN

## 2018-03-09 ENCOUNTER — Other Ambulatory Visit: Payer: Self-pay | Admitting: Internal Medicine

## 2018-03-09 DIAGNOSIS — M25511 Pain in right shoulder: Secondary | ICD-10-CM

## 2018-03-09 MED ORDER — MELOXICAM 7.5 MG PO TABS: 7.5000 mg | ORAL_TABLET | Freq: Every day | ORAL | 1 refills | Status: DC | PRN

## 2018-03-29 ENCOUNTER — Encounter: Payer: Self-pay | Admitting: Internal Medicine

## 2018-03-29 ENCOUNTER — Telehealth: Payer: Self-pay | Admitting: Internal Medicine

## 2018-03-29 LAB — HM DIABETES EYE EXAM

## 2018-03-29 NOTE — Telephone Encounter (Signed)
Please advise 

## 2018-03-29 NOTE — Telephone Encounter (Signed)
Please f/u ortho referral was placed right shoulder pain Did she get appt?  Call pt to confirm please

## 2018-03-29 NOTE — Telephone Encounter (Signed)
Copied from [place] [id number]. Topic: Quick Communication - See Telephone Encounter >> Mar 29, 2018 10:26 AM Alfredia Ferguson R wrote: Patient called in and stated she is still having pain in her shoulder she still has meloxicam (MOBIC) 7.5 MG tablet but it isnt helping. She wants to know if she can be prescribed more prednisone or does she need to go ahead and see an ortho doctor   CB# [phone]

## 2018-04-02 ENCOUNTER — Other Ambulatory Visit: Payer: Self-pay

## 2018-04-02 ENCOUNTER — Encounter: Payer: Self-pay | Admitting: Emergency Medicine

## 2018-04-02 ENCOUNTER — Emergency Department
Admission: EM | Admit: 2018-04-02 | Discharge: 2018-04-02 | Disposition: A | Discharge status: Home / Self Care | Payer: Medicare Other | Attending: Emergency Medicine | Admitting: Emergency Medicine

## 2018-04-02 DIAGNOSIS — I129 Hypertensive chronic kidney disease with stage 1 through stage 4 chronic kidney disease, or unspecified chronic kidney disease: Secondary | ICD-10-CM | POA: Insufficient documentation

## 2018-04-02 DIAGNOSIS — Z87891 Personal history of nicotine dependence: Secondary | ICD-10-CM | POA: Insufficient documentation

## 2018-04-02 DIAGNOSIS — E1122 Type 2 diabetes mellitus with diabetic chronic kidney disease: Secondary | ICD-10-CM | POA: Insufficient documentation

## 2018-04-02 DIAGNOSIS — Y998 Other external cause status: Secondary | ICD-10-CM | POA: Diagnosis not present

## 2018-04-02 DIAGNOSIS — Y9389 Activity, other specified: Secondary | ICD-10-CM | POA: Insufficient documentation

## 2018-04-02 DIAGNOSIS — Z7982 Long term (current) use of aspirin: Secondary | ICD-10-CM | POA: Diagnosis not present

## 2018-04-02 DIAGNOSIS — Y929 Unspecified place or not applicable: Secondary | ICD-10-CM | POA: Insufficient documentation

## 2018-04-02 DIAGNOSIS — Y33XXXA Other specified events, undetermined intent, initial encounter: Secondary | ICD-10-CM | POA: Insufficient documentation

## 2018-04-02 DIAGNOSIS — Z794 Long term (current) use of insulin: Secondary | ICD-10-CM | POA: Insufficient documentation

## 2018-04-02 DIAGNOSIS — N183 Chronic kidney disease, stage 3 (moderate): Secondary | ICD-10-CM | POA: Diagnosis not present

## 2018-04-02 DIAGNOSIS — S4991XA Unspecified injury of right shoulder and upper arm, initial encounter: Secondary | ICD-10-CM | POA: Diagnosis present

## 2018-04-02 DIAGNOSIS — Z79899 Other long term (current) drug therapy: Secondary | ICD-10-CM | POA: Diagnosis not present

## 2018-04-02 DIAGNOSIS — S46911A Strain of unspecified muscle, fascia and tendon at shoulder and upper arm level, right arm, initial encounter: Secondary | ICD-10-CM | POA: Diagnosis not present

## 2018-04-02 DIAGNOSIS — M25511 Pain in right shoulder: Secondary | ICD-10-CM

## 2018-04-02 MED ORDER — OXYCODONE-ACETAMINOPHEN 5-325 MG PO TABS
1.0000 | ORAL_TABLET | Freq: Once | ORAL | Status: AC
  Administered 2018-04-02: 1 via ORAL
  Filled 2018-04-02: qty 1

## 2018-04-02 MED ORDER — OXYCODONE-ACETAMINOPHEN 2.5-325 MG PO TABS: ORAL_TABLET | ORAL | 0 refills | Status: DC

## 2018-04-02 MED ORDER — DIAZEPAM 2 MG PO TABS
2.0000 mg | ORAL_TABLET | Freq: Once | ORAL | Status: AC
  Administered 2018-04-02: 2 mg via ORAL
  Filled 2018-04-02: qty 1

## 2018-04-02 MED ORDER — DIAZEPAM 2 MG PO TABS: 2.0000 mg | ORAL_TABLET | Freq: Three times a day (TID) | ORAL | 0 refills | Status: DC | PRN

## 2018-04-02 NOTE — Telephone Encounter (Signed)
Yes, she saw Dr. Candelaria Stagers at [facility] on 10/11

## 2018-04-02 NOTE — ED Provider Notes (Signed)
[facility] Emergency Department Provider Note   ____________________________________________   First MD Initiated Contact with Patient 04/02/18 [phone]     (approximate)  I have reviewed the triage vital signs and the nursing notes.   HISTORY  Chief Complaint Neck Pain and Shoulder Pain   HPI Natalie Watts is a 72 y.o. female presents to the ED with family members with complaint of right neck pain since yesterday along with her chronic right shoulder pain which seems to have gotten worse.  She feels there is some swelling in her right shoulder and pain is worse with movement.  Patient has taken meloxicam at home without any relief.  She states that she has been seeing the orthopedic department at [facility] for her shoulder pain and was x-rayed on Friday.  Over the weekend her grandson who weighs approximately 30 pounds was behind her on a step and was trying to climb up on her back.  She states that when she tried to catch him to keep him from falling she possibly injured her right shoulder when she grabbed him.  She states she is unable to sleep secondary to the pain.   Past Medical History:  Diagnosis Date  . Blood clotting disorder ([place])   . Cardiac murmur   . Carotid bruit    US carotid had 09/03/14 [facility] [place]   . Carpal tunnel syndrome   . Cervical radiculopathy    improved since surgery 2016   . Chicken pox   . 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 Loann Quill [facility]   . Degenerative disk disease   . Diabetes ([place])   . DVT, lower extremity ([place])    left, 05/2013 on Xarelto x 1 month etiology unkown   . GERD (gastroesophageal reflux disease)   . HSV-2 infection   . Hyperlipidemia   . Hypertension   . Insomnia   . Intertrigo   . Lumbar radiculopathy    improved after steroid inj and PT  . Peripheral vascular disease ([place])   . Subdeltoid bursitis   . Vitamin D deficiency     Patient  Active Problem List   Diagnosis Date Noted  . Allergic conjunctivitis 10/09/2017  . Heart murmur 06/16/2017  . Vitamin B12 deficiency 06/01/2017  . History of degenerative disc disease 06/01/2017  . Carotid bruit 06/01/2017  . Arthritis of spine 01/24/2017  . Candidiasis of skin 01/24/2017  . Carpal tunnel syndrome 01/24/2017  . Cervical radiculopathy 01/24/2017  . Generalized osteoarthritis 01/24/2017  . Gastroesophageal reflux disease without esophagitis 01/24/2017  . Genital herpes simplex type 2 01/24/2017  . Hyperlipidemia 01/24/2017  . Impairment of balance 01/24/2017  . Insomnia 01/24/2017  . Lumbar radiculopathy 01/24/2017  . Obesity 01/24/2017  . Peripheral vascular disease ([place]) 01/24/2017  . Vitamin D deficiency 01/24/2017  . Osteoarthritis of knee 01/16/2017  . Synovitis of ankle 12/15/2016  . PAD (peripheral artery disease) ([place]) 03/10/2016  . Malignant tumor of colon ([place]) 12/26/2014  . Dysplastic polyp of colon 12/25/2014  . GERD (gastroesophageal reflux disease) 11/30/2014  . Screening for colorectal cancer 11/30/2014  . Chronic kidney disease, stage III (moderate) ([place]) 11/07/2014  . Cervical spondylosis without myelopathy 06/05/2014  . Primary osteoarthritis of first carpometacarpal joint of left hand 06/05/2014  . Cervical spondylosis with radiculopathy 06/05/2014  . Diabetes ([place]) 04/16/2013  . HTN (hypertension) 04/16/2013  . Healthcare maintenance 04/16/2013  . Left lumbar radiculitis 10/19/2012  . Spinal stenosis  of lumbar region with neurogenic claudication 01/17/2012    Past Surgical History:  Procedure Laterality Date  . 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 Sheppard Evens  . COLON SURGERY    . TUBAL LIGATION      Prior to Admission medications   Medication Sig Start Date End Date Taking? Authorizing Provider  acetaminophen (TYLENOL) 650 MG CR tablet Take 650 mg by mouth every 8 (eight) hours as needed.     [provider]    amLODipine (NORVASC) 10 MG tablet Take 1 tablet (10 mg total) by mouth daily. 01/04/18   McLean-Scocuzza, Nino Glow, MD  aspirin EC 81 MG tablet Take 81 mg by mouth daily.     [provider]  Cholecalciferol 50000 units capsule Take 1 capsule (50,000 Units total) by mouth once a week. 06/16/17   McLean-Scocuzza, Nino Glow, MD  clotrimazole (LOTRIMIN) 1 % cream  05/25/14   [provider]  diazepam (VALIUM) 2 MG tablet Take 1 tablet (2 mg total) by mouth every 8 (eight) hours as needed for muscle spasms. 04/02/18   Johnn Hai, PA-C  EPIPEN 2-PAK 0.3 MG/0.3ML SOAJ injection  09/01/14   [provider]  glucose blood test strip Use 4 times daily before meals. 07/19/17   McLean-Scocuzza, Nino Glow, MD  hydrocortisone 2.5 % cream Apply 1 application topically 2 (two) times daily as needed.     [provider]  Insulin Pen Needle (PEN NEEDLES) 32G X 6 MM MISC 1 Device by Does not apply route daily. E11.9 10/26/17   McLean-Scocuzza, Nino Glow, MD  LANTUS SOLOSTAR 100 UNIT/ML Solostar Pen Inject 53 Units into the skin daily after breakfast. 03/02/18   McLean-Scocuzza, Nino Glow, MD  loratadine (CLARITIN) 10 MG tablet TAKE 1 TABLET BY MOUTH EVERY DAY 12/07/17   McLean-Scocuzza, Nino Glow, MD  meloxicam (MOBIC) 7.5 MG tablet Take 1 tablet (7.5 mg total) by mouth daily as needed for pain. 03/09/18   McLean-Scocuzza, Nino Glow, MD  nystatin cream (MYCOSTATIN) Apply 1,000,000 Units topically as needed.    [provider]  [facility] DELICA LANCETS FINE MISC 1 each by Does not apply route 4 (four) times daily as needed. 03/08/18   McLean-Scocuzza, Nino Glow, MD  oxycodone-acetaminophen (PERCOCET) 2.5-325 MG tablet 1 every 6-8 hours as needed for pain. 04/02/18   Johnn Hai, PA-C  Powders (GOLD BOND BABY POWDER EX) Apply 1 application topically as needed.     [provider]  pravastatin (PRAVACHOL) 40 MG tablet Take 40 mg by mouth daily.  08/28/14   [provider]  predniSONE (DELTASONE) 20 MG tablet Take 1 tablet (20 mg total) by mouth daily with breakfast. 02/12/18   McLean-Scocuzza, Nino Glow, MD  sitaGLIPtin (JANUVIA) 100 MG tablet Take 1 tablet (100 mg total) by mouth daily. 06/16/17   McLean-Scocuzza, Nino Glow, MD  valACYclovir (VALTREX) 500 MG tablet Take 1 tablet (500 mg total) by mouth 2 (two) times daily. Bid x 3 days with outbreak 24 hours before. 08/17/17   McLean-Scocuzza, Nino Glow, MD    Allergies Lisinopril; Sulfamethoxazole-trimethoprim; Metformin and related; Penicillins; and Tramadol  Family History  Problem Relation Age of Onset  . Hypertension Mother   . Arthritis Mother   . Cancer Father        colon cancer dx'ed died age 80  . Hypertension Sister   . Arthritis Sister   . Cancer Sister        died in 26s  brain tumor h/o lung cancer smoker died 2015-09-15   . Early death Sister   . Breast cancer Maternal Grandmother   . Cancer Maternal Grandmother        breast dx older age   . Hypertension Maternal Grandmother   . Breast cancer Other        maternal neice  . Cancer Other        died in 39s   . Hypertension Daughter   . Hypertension Daughter     Social History Social History   Tobacco Use  . Smoking status: Former Smoker    Packs/day: 1.00    Years: 45.00    Pack years: 45.00    Types: Cigarettes    Last attempt to quit: 12/18/2012    Years since quitting: 5.2  . Smokeless tobacco: Never Used  Substance Use Topics  . Alcohol use: No  . Drug use: Yes    Frequency: 3.0 times per week    Review of Systems Constitutional: No fever/chills Cardiovascular: Denies chest pain. Respiratory: Denies shortness of breath. Gastrointestinal: No abdominal pain.  No nausea, no vomiting.  Musculoskeletal: Positive for chronic and acute right shoulder pain. Skin: Negative for rash. Neurological: Negative for headaches, focal weakness or numbness. ____________________________________________   PHYSICAL EXAM:  VITAL SIGNS: ED  Triage Vitals  Enc Vitals Group     BP 04/02/18 0818 137/68     Pulse Rate 04/02/18 0818 82     Resp 04/02/18 0818 20     Temp 04/02/18 0818 98.4 F (36.9 C)     Temp Source 04/02/18 0818 Oral     SpO2 04/02/18 0818 97 %     Weight 04/02/18 0819 162 lb 11.2 oz (73.8 kg)     Height 04/02/18 0819 5\' 1"  (1.549 m)     Head Circumference --      Peak Flow --      Pain Score 04/02/18 0819 4     Pain Loc --      Pain Edu? --      Excl. in [place]? --    Constitutional: Alert and oriented. Well appearing and in no acute distress. Eyes: Conjunctivae are normal.  Head: Atraumatic. Neck: No stridor.  Nontender cervical spine to palpation posteriorly.  Range of motion is without restriction. Cardiovascular: Normal rate, regular rhythm. Grossly normal heart sounds.  Good peripheral circulation. Respiratory: Normal respiratory effort.  No retractions. Lungs CTAB. Gastrointestinal: Soft and nontender. No distention. Musculoskeletal: Examination of the right shoulder there is no gross deformity and no soft tissue swelling is present.  There is no evidence of recent injury such as ecchymosis or abrasions.  Range of motion is limited secondary to patient's pain and increased pain with range of motion.  Minimal crepitus.  Patient muscle strength is equal.  Nontender right elbow or wrist to palpation and range of motion. Neurologic:  Normal speech and language. No gross focal neurologic deficits are appreciated.  Skin:  Skin is warm, dry and intact. No rash noted. Psychiatric: Mood and affect are normal. Speech and behavior are normal.  ____________________________________________   LABS (all labs ordered are listed, but only abnormal results are displayed)  Labs Reviewed - No data to display  RADIOLOGY Deferred  Official radiology report(s): No results found.   Right shoulder x-ray reviewed from [facility] on  03/30/2018 ____________________________________________   PROCEDURES  Procedure(s) performed: None  Procedures  Critical Care performed: No  ____________________________________________   INITIAL IMPRESSION / ASSESSMENT AND PLAN /  ED COURSE  As part of my medical decision making, I reviewed the following data within the [place] Notes from prior ED visits and [place] Controlled Substance Database  Patient presents with family with complaint of acute exacerbation of her chronic right shoulder pain.  Patient has been seen at [facility] orthopedic department for shoulder pain and had a recent x-ray which showed same minor degenerative changes.  Patient also over the weekend had to keep a grandchild from falling when the grandchild was "climbing up her back".  This increased the pain in her shoulder.  Patient was given Percocet and diazepam 2 mg prior to x-rays.  Before discharge patient denies any pain.  Family states that they will stay with her as they know how she is with pain medication.  We discussed muscle relaxant versus pain medication.  She will also continue taking the meloxicam as instructed by her doctor.  Family knows that the pain medication is every 8 hours and can cause increased drowsiness as well as the diazepam for muscle relaxant.  They are encouraged to use ice or heat to her shoulder as needed for discomfort.  She is to return to the emergency department if severe worsening of her symptoms.  ____________________________________________   FINAL CLINICAL IMPRESSION(S) / ED DIAGNOSES  Final diagnoses:  Acute pain of right shoulder  Right shoulder strain, initial encounter     ED Discharge Orders         Ordered    diazepam (VALIUM) 2 MG tablet  Every 8 hours PRN     04/02/18 1029    oxycodone-acetaminophen (PERCOCET) 2.5-325 MG tablet  Status:  Discontinued     04/02/18 1029    oxycodone-acetaminophen (PERCOCET) 2.5-325 MG tablet     04/02/18  1106           Note:  This document was prepared using Dragon voice recognition software and may include unintentional dictation errors.    Johnn Hai, PA-C 04/02/18 1240    Harvest Dark, MD 04/02/18 1455

## 2018-04-02 NOTE — Discharge Instructions (Signed)
Follow-up with your primary care provider or the orthopedist at [facility] if any continued problems.  Begin taking medication only as directed.  Diazepam 2 mg every 8 hours as needed for muscle spasms and Percocet every 6 to 8 hours as needed for pain.  Be aware that both these medications could cause drowsiness and increase your risk for falling and further injury.  You may use ice or heat to your shoulder as needed for discomfort. Return to the emergency department if any severe worsening of your symptoms.

## 2018-04-02 NOTE — ED Triage Notes (Signed)
R neck pain during night. R shoulder and arm pain since mid past week. No new injury.

## 2018-04-02 NOTE — ED Notes (Signed)
See triage note  Presents with pain to right side of neck since yesterday  States she had was seen last week for shoulder pain  But states pain to neck started after she was seen   Min swelling noted to right anterior shoulder and pain increased with movement

## 2018-04-03 ENCOUNTER — Other Ambulatory Visit: Payer: Self-pay | Admitting: Sports Medicine

## 2018-04-03 ENCOUNTER — Other Ambulatory Visit (HOSPITAL_COMMUNITY): Payer: Self-pay | Admitting: Sports Medicine

## 2018-04-03 DIAGNOSIS — M25511 Pain in right shoulder: Principal | ICD-10-CM

## 2018-04-03 DIAGNOSIS — G8929 Other chronic pain: Secondary | ICD-10-CM

## 2018-04-04 ENCOUNTER — Telehealth: Payer: Self-pay | Admitting: Internal Medicine

## 2018-04-04 NOTE — Telephone Encounter (Signed)
Contacted pt's daughter, Ernesto Rutherford, regarding pt's shoulder pain; pt seen in ED on 04/02/18; conference called initiated with Tye Maryland, [facility]; pt offered and accepted appointment with Dr McLean-Scocuzza 04/05/18 at [place]; Tye Maryland also instructs [place] to ask Dr Aundra Dubin if the pt needs to keep her previously scheduled appointment with Dr McLean-Scocuzza on 04/06/18 at 1100; she verbalizes understanding.

## 2018-04-04 NOTE — Telephone Encounter (Signed)
Copied from [place] [id number]. Topic: General - Other >> Apr 04, 2018 10:15 AM Yvette Rack wrote: Reason for CRM: Pt daughter Rubbie Battiest states she needs a nurse to call her back for some advice on what can be done for pt pain until the appt on Friday 04/06/18. [name] requests call back. Cb# [phone]

## 2018-04-05 ENCOUNTER — Ambulatory Visit: Payer: Medicare Other | Admitting: Internal Medicine

## 2018-04-05 ENCOUNTER — Ambulatory Visit (INDEPENDENT_AMBULATORY_CARE_PROVIDER_SITE_OTHER)
Admission: RE | Admit: 2018-04-05 | Discharge: 2018-04-05 | Disposition: A | Discharge status: Home / Self Care | Payer: Medicare Other | Source: Ambulatory Visit | Attending: Internal Medicine | Admitting: Internal Medicine

## 2018-04-05 ENCOUNTER — Ambulatory Visit
Admission: RE | Admit: 2018-04-05 | Discharge: 2018-04-05 | Disposition: A | Discharge status: Home / Self Care | Payer: Medicare Other | Source: Ambulatory Visit | Attending: Internal Medicine | Admitting: Internal Medicine

## 2018-04-05 ENCOUNTER — Encounter: Payer: Self-pay | Admitting: Internal Medicine

## 2018-04-05 ENCOUNTER — Ambulatory Visit: Payer: Medicare Other

## 2018-04-05 ENCOUNTER — Other Ambulatory Visit: Payer: Self-pay | Admitting: Internal Medicine

## 2018-04-05 VITALS — BP 138/76 | HR 79 | Temp 98.2°F | Resp 16 | Ht 61.0 in | Wt 177.4 lb

## 2018-04-05 DIAGNOSIS — M545 Low back pain, unspecified: Secondary | ICD-10-CM

## 2018-04-05 DIAGNOSIS — M25551 Pain in right hip: Secondary | ICD-10-CM

## 2018-04-05 DIAGNOSIS — M542 Cervicalgia: Secondary | ICD-10-CM

## 2018-04-05 DIAGNOSIS — M25552 Pain in left hip: Principal | ICD-10-CM

## 2018-04-05 DIAGNOSIS — Z23 Encounter for immunization: Secondary | ICD-10-CM

## 2018-04-05 DIAGNOSIS — E785 Hyperlipidemia, unspecified: Secondary | ICD-10-CM

## 2018-04-05 HISTORY — DX: Cervicalgia: M54.2

## 2018-04-05 HISTORY — DX: Low back pain, unspecified: M54.50

## 2018-04-05 MED ORDER — PRAVASTATIN SODIUM 40 MG PO TABS: 40.0000 mg | ORAL_TABLET | Freq: Every day | ORAL | 3 refills | Status: DC

## 2018-04-05 MED ORDER — CYCLOBENZAPRINE HCL 5 MG PO TABS: 5.0000 mg | ORAL_TABLET | Freq: Every evening | ORAL | 2 refills | Status: DC | PRN

## 2018-04-05 MED ORDER — OXYCODONE-ACETAMINOPHEN 2.5-325 MG PO TABS: ORAL_TABLET | ORAL | 0 refills | Status: DC

## 2018-04-05 NOTE — Patient Instructions (Signed)

## 2018-04-05 NOTE — Progress Notes (Addendum)
Chief Complaint  Patient presents with  . Neck Pain  . Shoulder Pain   F/u  1. Right shoulder pain 10/10 or greater MRI sch sat Xray with moderate feels like spasms and hurting on percocet 2.5-325, valvium prn has 1 tablet left and prednisone did 7 days she was also given Gabapentin 300 mg qhs. She has limited ROM with neck  2. C/o neck and left hip and low back pain  Xray neck sp neck surgery with C3/7 arthritis changes   Review of Systems  Constitutional: Negative for weight loss.  HENT: Negative for hearing loss.   Respiratory: Negative for shortness of breath.   Cardiovascular: Negative for chest pain.  Musculoskeletal: Positive for back pain, joint pain and neck pain.   Past Medical History:  Diagnosis Date  . 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 Cardiac murmur   . Carotid bruit    US carotid had 09/03/14 [facility] [place]   . Carpal tunnel syndrome   . Cervical radiculopathy    improved since surgery 10/14/2014   . Chicken pox   . 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 Loann Quill [facility]   . Degenerative disk disease   . Diabetes ([place])   . DVT, lower extremity ([place])    left, 05/2013 on Xarelto x 1 month etiology unkown   . GERD (gastroesophageal reflux disease)   . HSV-2 infection   . Hyperlipidemia   . Hypertension   . Insomnia   . Intertrigo   . Lumbar radiculopathy    improved after steroid inj and PT  . Peripheral vascular disease ([place])   . Subdeltoid bursitis   . Vitamin D deficiency    Past Surgical History:  Procedure Laterality Date  . 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 Sheppard Evens  . COLON SURGERY    . TUBAL LIGATION     Family History  Problem Relation Age of Onset  . Hypertension Mother   . Arthritis Mother   . Cancer Father        colon cancer dx'ed died age 59  . Hypertension Sister   . Arthritis Sister   . Cancer Sister        died in 45s brain tumor h/o lung cancer smoker died 2015-10-14   .  Early death Sister   . Breast cancer Maternal Grandmother   . Cancer Maternal Grandmother        breast dx older age   . Hypertension Maternal Grandmother   . Breast cancer Other        maternal neice  . Cancer Other        died in 66s   . Hypertension Daughter   . Hypertension Daughter    Social History   Socioeconomic History  . Marital status: Divorced    Spouse name: Not on file  . Number of children: Not on file  . Years of education: Not on file  . Highest education level: Not on file  Occupational History  . Not on file  Social Needs  . Financial resource strain: Not hard at all  . Food insecurity:    Worry: Never true    Inability: Never true  . Transportation needs:    Medical: No    Non-medical: No  Tobacco Use  . Smoking status: Former Smoker    Packs/day: 1.00    Years: 45.00    Pack years: 45.00    Types: Cigarettes  Last attempt to quit: 12/18/2012    Years since quitting: 5.2  . Smokeless tobacco: Never Used  Substance and Sexual Activity  . Alcohol use: No  . Drug use: Yes    Frequency: 3.0 times per week  . Sexual activity: Not on file  Lifestyle  . Physical activity:    Days per week: 5 days    Minutes per session: 60 min  . Stress: Not at all  Relationships  . Social connections:    Talks on phone: Not on file    Gets together: Not on file    Attends religious service: Not on file    Active member of club or organization: Not on file    Attends meetings of clubs or organizations: Not on file    Relationship status: Not on file  . Intimate partner violence:    Fear of current or ex partner: Not on file    Emotionally abused: Not on file    Physically abused: Not on file    Forced sexual activity: Not on file  Other Topics Concern  . Not on file  Social History Narrative   Divorced    2 daughters [name] comes to most appts and is Chief Executive Officer    -lives with daughter [name]    Used to work for [employer] now [employer]    Former  smoker age 67 to age 72/2012 quit for sure in 2014 max 1/2 ppd FH lung cancer sister also smoker    Current Meds  Medication Sig  . acetaminophen (TYLENOL) 650 MG CR tablet Take 650 mg by mouth every 8 (eight) hours as needed.   Marland Kitchen amLODipine (NORVASC) 10 MG tablet Take 1 tablet (10 mg total) by mouth daily.  Marland Kitchen aspirin EC 81 MG tablet Take 81 mg by mouth daily.   . Cholecalciferol 50000 units capsule Take 1 capsule (50,000 Units total) by mouth once a week.  . clotrimazole (LOTRIMIN) 1 % cream   . diazepam (VALIUM) 2 MG tablet Take 1 tablet (2 mg total) by mouth every 8 (eight) hours as needed for muscle spasms.  Marland Kitchen EPIPEN 2-PAK 0.3 MG/0.3ML SOAJ injection   . gabapentin (NEURONTIN) 300 MG capsule Take by mouth.  Marland Kitchen glucose blood test strip Use 4 times daily before meals.  . hydrocortisone 2.5 % cream Apply 1 application topically 2 (two) times daily as needed.   . Insulin Pen Needle (PEN NEEDLES) 32G X 6 MM MISC 1 Device by Does not apply route daily. E11.9  . LANTUS SOLOSTAR 100 UNIT/ML Solostar Pen Inject 53 Units into the skin daily after breakfast.  . loratadine (CLARITIN) 10 MG tablet TAKE 1 TABLET BY MOUTH EVERY DAY  . meloxicam (MOBIC) 7.5 MG tablet Take 1 tablet (7.5 mg total) by mouth daily as needed for pain.  Marland Kitchen nystatin cream (MYCOSTATIN) Apply 1,000,000 Units topically as needed.  Glory Rosebush DELICA LANCETS FINE MISC 1 each by Does not apply route 4 (four) times daily as needed.  Marland Kitchen oxycodone-acetaminophen (PERCOCET) 2.5-325 MG tablet 1 every 6-8 hours as needed for pain.  . Powders (GOLD BOND BABY POWDER EX) Apply 1 application topically as needed.   . pravastatin (PRAVACHOL) 40 MG tablet Take 1 tablet (40 mg total) by mouth daily. At night  . sitaGLIPtin (JANUVIA) 100 MG tablet Take 1 tablet (100 mg total) by mouth daily.  . valACYclovir (VALTREX) 500 MG tablet Take 1 tablet (500 mg total) by mouth 2 (two) times daily. Bid x 3 days  with outbreak 24 hours before.  . [DISCONTINUED]  oxycodone-acetaminophen (PERCOCET) 2.5-325 MG tablet 1 every 6-8 hours as needed for pain.  . [DISCONTINUED] pravastatin (PRAVACHOL) 40 MG tablet Take 40 mg by mouth daily.   . [DISCONTINUED] predniSONE (DELTASONE) 20 MG tablet Take 1 tablet (20 mg total) by mouth daily with breakfast.   Allergies  Allergen Reactions  . Lisinopril Anaphylaxis and Swelling    Tongue swelling Tongue swelling Tongue swelling Tongue swelling Tongue swelling Tongue swelling Tongue swelling Tongue swelling Tongue swelling Tongue swelling Tongue swelling Tongue swelling  . Sulfamethoxazole-Trimethoprim Other (See Comments)    Other reaction(s): Other (See Comments)  . Metformin And Related     GI sx's   . Penicillins     Childhood  . Tramadol Nausea Only and Nausea And Vomiting   Recent Results (from the past 2160 hour(s))  HM DIABETES EYE EXAM     Status: None   Collection Time: 03/29/18 12:00 AM  Result Value Ref Range   HM Diabetic Eye Exam No Retinopathy No Retinopathy    Comment: [place] [facility]    Objective  Body mass index is 33.51 kg/m. Wt Readings from Last 3 Encounters:  04/05/18 177 lb 6 oz (80.5 kg)  04/02/18 162 lb 11.2 oz (73.8 kg)  02/12/18 170 lb (77.1 kg)   Temp Readings from Last 3 Encounters:  04/05/18 98.2 F (36.8 C) (Oral)  04/02/18 98.4 F (36.9 C) (Oral)  02/12/18 98.6 F (37 C) (Oral)   BP Readings from Last 3 Encounters:  04/05/18 138/76  04/02/18 130/70  02/12/18 124/76   Pulse Readings from Last 3 Encounters:  04/05/18 79  04/02/18 78  02/12/18 91    Physical Exam  Constitutional: She is oriented to person, place, and time. Vital signs are normal. She appears well-developed and well-nourished. She is cooperative.  HENT:  Head: Normocephalic and atraumatic.  Mouth/Throat: Oropharynx is clear and moist and mucous membranes are normal.  Eyes: Pupils are equal, round, and reactive to light. Conjunctivae are normal.  Cardiovascular: Normal rate, regular  rhythm and normal heart sounds.  Pulmonary/Chest: Effort normal and breath sounds normal.  Musculoskeletal:       Right shoulder: She exhibits decreased range of motion and tenderness.       Left hip: She exhibits tenderness.       Cervical back: She exhibits decreased range of motion and tenderness.  Neurological: She is alert and oriented to person, place, and time. Gait normal.  Skin: Skin is warm, dry and intact.  Psychiatric: She has a normal mood and affect. Her speech is normal and behavior is normal. Judgment and thought content normal. Cognition and memory are normal.  Nursing note and vitals reviewed.   Assessment   1. Right shoulder pain with mod arthritis changes  2. Cervicalgia and left hip and low back pain 3. HM Plan   1.  MRI sch sat  F/u ortho [facility] next Tuesday try to get sch with Dr. Roland Rack  Refilled percocet 2.5-325  Flexeril  Stop gabapentin and valium  Saw Dr. Roland Rack 04/16/18 given steroid shot right shoulder if does not work deems sx's coming from neck could consider MRI and referral PM&R vs NS  Xray 04/16/18 cervical  Cervical X-Ray Imaging: AP, lateral, and oblique views of the cervical spine are obtained. These  films demonstrate postsurgical changes secondary to posterior  decompression and fusion from C3-C7. The pedicle screws appear to be in  excellent position and without evidence of loosening, as  does the fusion  mass. Moderate degenerative changes at C2-3 and at C7 and T1 are noted.  There is no evidence of spondylolysis or spondylolisthesis. No lytic  lesions or fractures are identified.  2. Xray neck low back and hips today  See above  3. Flu shot high dose given today   Provider: Dr. Olivia Mackie McLean-Scocuzza-Internal Medicine

## 2018-04-06 ENCOUNTER — Ambulatory Visit: Payer: Medicare Other | Admitting: Internal Medicine

## 2018-04-06 ENCOUNTER — Other Ambulatory Visit: Payer: Self-pay | Admitting: Internal Medicine

## 2018-04-06 ENCOUNTER — Encounter

## 2018-04-06 DIAGNOSIS — K8020 Calculus of gallbladder without cholecystitis without obstruction: Secondary | ICD-10-CM

## 2018-04-07 ENCOUNTER — Ambulatory Visit (HOSPITAL_COMMUNITY)
Admission: RE | Admit: 2018-04-07 | Discharge: 2018-04-07 | Disposition: A | Discharge status: Home / Self Care | Payer: Medicare Other | Source: Ambulatory Visit | Attending: Sports Medicine | Admitting: Sports Medicine

## 2018-04-07 DIAGNOSIS — M25511 Pain in right shoulder: Secondary | ICD-10-CM | POA: Diagnosis not present

## 2018-04-07 DIAGNOSIS — G8929 Other chronic pain: Secondary | ICD-10-CM | POA: Insufficient documentation

## 2018-04-07 DIAGNOSIS — M19011 Primary osteoarthritis, right shoulder: Secondary | ICD-10-CM | POA: Diagnosis not present

## 2018-04-07 DIAGNOSIS — M75101 Unspecified rotator cuff tear or rupture of right shoulder, not specified as traumatic: Secondary | ICD-10-CM | POA: Insufficient documentation

## 2018-04-09 ENCOUNTER — Telehealth: Payer: Self-pay | Admitting: Internal Medicine

## 2018-04-09 NOTE — Telephone Encounter (Signed)
Patient was informed of results.  Patient understood and no questions, comments, or concerns at this time.  

## 2018-04-09 NOTE — Telephone Encounter (Signed)
Copied from [place] [id number]. Topic: Quick Communication - See Telephone Encounter >> Apr 09, 2018  2:09 PM Blase Mess A wrote: CRM for notification. See Telephone encounter for: 04/09/18. Patient is calling to get the result of her MRI.  Patient is wanting know the next step she is in extreme pain. Patient callback number [phone]

## 2018-04-09 NOTE — Telephone Encounter (Signed)
Rotator cuff tendonitis and atrophy of muscles likely due to not using and rotator cuff tears  Moderate to severe arthritis  Small volume of subacromial/subdeltoid fluid suggestive of bursitis.   Follow up with ortho Dr. Roland Rack if shoulder still hurting call daily for cancellations only with him   [place]

## 2018-04-10 ENCOUNTER — Other Ambulatory Visit: Payer: Self-pay | Admitting: Internal Medicine

## 2018-04-10 DIAGNOSIS — M545 Low back pain, unspecified: Secondary | ICD-10-CM

## 2018-04-10 DIAGNOSIS — M25552 Pain in left hip: Secondary | ICD-10-CM

## 2018-04-10 DIAGNOSIS — M542 Cervicalgia: Secondary | ICD-10-CM

## 2018-04-10 NOTE — Telephone Encounter (Unsigned)
Copied from [place] [phone]. Topic: Quick Communication - Rx Refill/Question >> Apr 10, 2018 11:57 AM Judyann Munson wrote: Medication: oxycodone-acetaminophen (PERCOCET) 2.5-325 MG tablet    Has the patient contacted their pharmacy? Yes the  patient is calling  to state the pharmacy is unable to transfer the medication they are out of stock.   Preferred Pharmacy (with phone number or street name): [facility] [place], [place] [phone] (Phone) [phone] (Fax)    Agent: Please be advised that RX refills may take up to 3 business days. We ask that you follow-up with your pharmacy.

## 2018-04-10 NOTE — Telephone Encounter (Signed)
Pt's daughter retuning call asking when percocet prescription could be filled for the pt because the pt only has one tablet left. Notified pt's daughter that the prescription would have to be sent to another pharmacy by the pt's PCP due to the medication being a controlled substance. Pt's daughter asking what could be done in the meantime to help with pain. Pt's daughter advised that she could try heat/ice to neck and try OTC medication like tylenol and ibuprofen if the pt was able to tolerate. Pt's daughter also advised if pain became severe or worse during the night to seek treatment in the ED. Understanding verbalized.

## 2018-04-10 NOTE — Telephone Encounter (Signed)
I can only give a 1 week supply of pain medication its the law  She will need to f/u with ortho  Please move ortho appt up with Dr. Roland Rack for abnormal MRI    [place]

## 2018-04-11 ENCOUNTER — Other Ambulatory Visit: Payer: Self-pay | Admitting: Internal Medicine

## 2018-04-11 DIAGNOSIS — M542 Cervicalgia: Secondary | ICD-10-CM

## 2018-04-11 DIAGNOSIS — M545 Low back pain, unspecified: Secondary | ICD-10-CM

## 2018-04-11 DIAGNOSIS — M25552 Pain in left hip: Secondary | ICD-10-CM

## 2018-04-11 MED ORDER — OXYCODONE-ACETAMINOPHEN 2.5-325 MG PO TABS: ORAL_TABLET | ORAL | 0 refills | Status: DC

## 2018-04-11 NOTE — Telephone Encounter (Signed)
[facility] [place] [facility]  [facility] would [facility] the [facility] [address]  Also they are calling Dr Lang Snow office everyday unable to get seen by him.  Once the medication runs out if unable to get an appointment, patients daughter will be calling to have medication refilled if it is needed if unable to be seen at ortho soon.

## 2018-04-11 NOTE — Telephone Encounter (Signed)
[facility] does not have any available appointments with Dr. Roland Rack prior to the appointment that she has with him on Nov. 1.

## 2018-04-11 NOTE — Telephone Encounter (Signed)
Inform pt I cant refill more than 5-7 days of this please f/u ortho  Try to get ortho appt moved up   [place]

## 2018-04-15 ENCOUNTER — Ambulatory Visit: Payer: Medicare Other

## 2018-04-16 ENCOUNTER — Ambulatory Visit
Admission: RE | Admit: 2018-04-16 | Discharge: 2018-04-16 | Disposition: A | Discharge status: Home / Self Care | Payer: Medicare Other | Source: Ambulatory Visit | Attending: Internal Medicine | Admitting: Internal Medicine

## 2018-04-16 ENCOUNTER — Ambulatory Visit: Payer: Medicare Other

## 2018-04-16 DIAGNOSIS — M75121 Complete rotator cuff tear or rupture of right shoulder, not specified as traumatic: Secondary | ICD-10-CM | POA: Insufficient documentation

## 2018-04-16 DIAGNOSIS — K8020 Calculus of gallbladder without cholecystitis without obstruction: Secondary | ICD-10-CM | POA: Insufficient documentation

## 2018-04-16 DIAGNOSIS — M7581 Other shoulder lesions, right shoulder: Secondary | ICD-10-CM

## 2018-04-16 HISTORY — DX: Complete rotator cuff tear or rupture of right shoulder, not specified as traumatic: M75.121

## 2018-04-16 HISTORY — DX: Other shoulder lesions, right shoulder: M75.81

## 2018-04-30 ENCOUNTER — Telehealth: Payer: Self-pay

## 2018-04-30 NOTE — Telephone Encounter (Signed)
Copied from [place] [phone]. Topic: Referral - Request for Referral >> Apr 30, 2018  4:28 PM Reyne Dumas L wrote: Has patient seen PCP for this complaint? no *If NO, is insurance requiring patient see PCP for this issue before PCP can refer them? Referral for which specialty: physical therapy Preferred provider/office: unknown - needs someone in the area Reason for referral: neurosurgeon in [place] gave RX for physical therapy.  But didn't recommend anyone.

## 2018-05-02 ENCOUNTER — Other Ambulatory Visit: Payer: Self-pay | Admitting: Internal Medicine

## 2018-05-02 DIAGNOSIS — M25511 Pain in right shoulder: Secondary | ICD-10-CM

## 2018-05-02 DIAGNOSIS — G8929 Other chronic pain: Secondary | ICD-10-CM

## 2018-05-02 DIAGNOSIS — M542 Cervicalgia: Secondary | ICD-10-CM

## 2018-05-02 NOTE — Telephone Encounter (Signed)
Referred to [facility] [place] take Neurosurgeon orders there   [place]

## 2018-05-08 ENCOUNTER — Encounter: Payer: Self-pay | Admitting: Internal Medicine

## 2018-05-08 ENCOUNTER — Ambulatory Visit: Payer: Medicare Other | Admitting: Internal Medicine

## 2018-05-08 ENCOUNTER — Other Ambulatory Visit: Payer: Self-pay

## 2018-05-08 ENCOUNTER — Telehealth: Payer: Self-pay | Admitting: Internal Medicine

## 2018-05-08 VITALS — BP 126/80 | HR 96 | Temp 98.7°F | Ht 61.0 in | Wt 166.8 lb

## 2018-05-08 DIAGNOSIS — M47819 Spondylosis without myelopathy or radiculopathy, site unspecified: Secondary | ICD-10-CM

## 2018-05-08 DIAGNOSIS — M5410 Radiculopathy, site unspecified: Secondary | ICD-10-CM

## 2018-05-08 DIAGNOSIS — M5412 Radiculopathy, cervical region: Secondary | ICD-10-CM | POA: Diagnosis not present

## 2018-05-08 DIAGNOSIS — M25511 Pain in right shoulder: Secondary | ICD-10-CM | POA: Insufficient documentation

## 2018-05-08 DIAGNOSIS — Z794 Long term (current) use of insulin: Secondary | ICD-10-CM

## 2018-05-08 DIAGNOSIS — I10 Essential (primary) hypertension: Secondary | ICD-10-CM

## 2018-05-08 DIAGNOSIS — M542 Cervicalgia: Secondary | ICD-10-CM

## 2018-05-08 DIAGNOSIS — E119 Type 2 diabetes mellitus without complications: Secondary | ICD-10-CM

## 2018-05-08 DIAGNOSIS — G8929 Other chronic pain: Secondary | ICD-10-CM

## 2018-05-08 HISTORY — DX: Pain in right shoulder: M25.511

## 2018-05-08 LAB — POCT GLYCOSYLATED HEMOGLOBIN (HGB A1C): HEMOGLOBIN A1C: 7.1 % — AB (ref 4.0–5.6)

## 2018-05-08 MED ORDER — ONETOUCH ULTRA 2 W/DEVICE KIT: PACK | 0 refills | Status: AC

## 2018-05-08 MED ORDER — ONETOUCH DELICA PLUS LANCET30G MISC: 1.0000 | Freq: Two times a day (BID) | 3 refills | Status: DC

## 2018-05-08 NOTE — Patient Instructions (Addendum)
Sent [facility] to [facility] [place]  Let me know if you want to do an MRI of your neck  You can try as needed flexeril for pulling sensation in your neck    Muscle Cramps and Spasms Muscle cramps and spasms occur when a muscle or muscles tighten and you have no control over this tightening (involuntary muscle contraction). They are a common problem and can develop in any muscle. The most common place is in the calf muscles of the leg. Muscle cramps and muscle spasms are both involuntary muscle contractions, but there are some differences between the two:  Muscle cramps are painful. They come and go and may last a few seconds to 15 minutes. Muscle cramps are often more forceful and last longer than muscle spasms.  Muscle spasms may or may not be painful. They may also last just a few seconds or much longer.  Certain medical conditions, such as diabetes or Parkinson disease, can make it more likely to develop cramps or spasms. However, cramps or spasms are usually not caused by a serious underlying problem. Common causes include:  Overexertion.  Overuse from repetitive motions, or doing the same thing over and over.  Remaining in a certain position for a long period of time.  Improper preparation, form, or technique while playing a sport or doing an activity.  Dehydration.  Injury.  Side effects of some medicines.  Abnormally low levels of the salts and ions in your blood (electrolytes), especially potassium and calcium. This could happen if you are taking water pills (diuretics) or if you are pregnant.  In many cases, the cause of muscle cramps or spasms is unknown. Follow these instructions at home:  Stay well hydrated. Drink enough fluid to keep your urine clear or pale yellow.  Try massaging, stretching, and relaxing the affected muscle.  If directed, apply heat to tight or tense muscles as often as told by your health care provider. Use the heat source that your health care provider  recommends, such as a moist heat pack or a heating pad. ? Place a towel between your skin and the heat source. ? Leave the heat on for 20-30 minutes. ? Remove the heat if your skin turns bright red. This is especially important if you are unable to feel pain, heat, or cold. You may have a greater risk of getting burned.  If directed, put ice on the affected area. This may help if you are sore or have pain after a cramp or spasm. ? Put ice in a plastic bag. ? Place a towel between your skin and the bag. ? Leavethe ice on for 20 minutes, 2-3 times a day.  Take over-the-counter and prescription medicines only as told by your health care provider.  Pay attention to any changes in your symptoms. Contact a health care provider if:  Your cramps or spasms get more severe or happen more often.  Your cramps or spasms do not improve over time. This information is not intended to replace advice given to you by your health care provider. Make sure you discuss any questions you have with your health care provider. Document Released: 11/26/2001 Document Revised: 07/08/2015 Document Reviewed: 03/10/2015 Elsevier Interactive Patient Education  2018 [employer].  Cervical Radiculopathy Cervical radiculopathy happens when a nerve in the neck (cervical nerve) is pinched or bruised. This condition can develop because of an injury or as part of the normal aging process. Pressure on the cervical nerves can cause pain or numbness that  runs from the neck all the way down into the arm and fingers. Usually, this condition gets better with rest. Treatment may be needed if the condition does not improve. What are the causes? This condition may be caused by:  Injury.  Slipped (herniated) disk.  Muscle tightness in the neck because of overuse.  Arthritis.  Breakdown or degeneration in the bones and joints of the spine (spondylosis) due to aging.  Bone spurs that may develop near the cervical nerves.  What  are the signs or symptoms? Symptoms of this condition include:  Pain that runs from the neck to the arm and hand. The pain can be severe or irritating. It may be worse when the neck is moved.  Numbness or weakness in the affected arm and hand.  How is this diagnosed? This condition may be diagnosed based on symptoms, medical history, and a physical exam. You may also have tests, including:  X-rays.  CT scan.  MRI.  Electromyogram (EMG).  Nerve conduction tests.  How is this treated? In many cases, treatment is not needed for this condition. With rest, the condition usually gets better over time. If treatment is needed, options may include:  Wearing a soft neck collar for short periods of time.  Physical therapy to strengthen your neck muscles.  Medicines, such as NSAIDs, oral corticosteroids, or spinal injections.  Surgery. This may be needed if other treatments do not help. Various types of surgery may be done depending on the cause of your problems.  Follow these instructions at home: Managing pain  Take over-the-counter and prescription medicines only as told by your health care provider.  If directed, apply ice to the affected area. ? Put ice in a plastic bag. ? Place a towel between your skin and the bag. ? Leave the ice on for 20 minutes, 2-3 times per day.  If ice does not help, you can try using heat. Take a warm shower or warm bath, or use a heat pack as told by your health care provider.  Try a gentle neck and shoulder massage to help relieve symptoms. Activity  Rest as needed. Follow instructions from your health care provider about any restrictions on activities.  Do stretching and strengthening exercises as told by your health care provider or physical therapist. General instructions  If you were given a soft collar, wear it as told by your health care provider.  Use a flat pillow when you sleep.  Keep all follow-up visits as told by your health  care provider. This is important. Contact a health care provider if:  Your condition does not improve with treatment. Get help right away if:  Your pain gets much worse and cannot be controlled with medicines.  You have weakness or numbness in your hand, arm, face, or leg.  You have a high fever.  You have a stiff, rigid neck.  You lose control of your bowels or your bladder (have incontinence).  You have trouble with walking, balance, or speaking. This information is not intended to replace advice given to you by your health care provider. Make sure you discuss any questions you have with your health care provider. Document Released: 03/01/2001 Document Revised: 11/12/2015 Document Reviewed: 07/31/2014 Elsevier Interactive Patient Education  2018 [place] Cuff Injury Rotator cuff injury is any type of injury to the set of muscles and tendons that make up the stabilizing unit of your shoulder. This unit holds the ball of your upper arm bone (humerus)  in the socket of your shoulder blade (scapula). What are the causes? Injuries to your rotator cuff most commonly come from sports or activities that cause your arm to be moved repeatedly over your head. Examples of this include throwing, weight lifting, swimming, or racquet sports. Long lasting (chronic) irritation of your rotator cuff can cause soreness and swelling (inflammation), bursitis, and eventual damage to your tendons, such as a tear (rupture). What are the signs or symptoms? Acute rotator cuff tear:  Sudden tearing sensation followed by severe pain shooting from your upper shoulder down your arm toward your elbow.  Decreased range of motion of your shoulder because of pain and muscle spasm.  Severe pain.  Inability to raise your arm out to the side because of pain and loss of muscle power (large tears).  Chronic rotator cuff tear:  Pain that usually is worse at night and may interfere with sleep.  Gradual  weakness and decreased shoulder motion as the pain worsens.  Decreased range of motion.  Rotator cuff tendinitis:  Deep ache in your shoulder and the outside upper arm over your shoulder.  Pain that comes on gradually and becomes worse when lifting your arm to the side or turning it inward.  How is this diagnosed? Rotator cuff injury is diagnosed through a medical history, physical exam, and imaging exam. The medical history helps determine the type of rotator cuff injury. Your health care provider will look at your injured shoulder, feel the injured area, and ask you to move your shoulder in different positions. X-ray exams typically are done to rule out other causes of shoulder pain, such as fractures. MRI is the exam of choice for the most severe shoulder injuries because the images show muscles and tendons. How is this treated? Chronic tear:  Medicine for pain, such as acetaminophen or ibuprofen.  Physical therapy and range-of-motion exercises may be helpful in maintaining shoulder function and strength.  Steroid injections into your shoulder joint.  Surgical repair of the rotator cuff if the injury does not heal with noninvasive treatment.  Acute tear:  Anti-inflammatory medicines such as ibuprofen and naproxen to help reduce pain and swelling.  A sling to help support your arm and rest your rotator cuff muscles. Long-term use of a sling is not advised. It may cause significant stiffening of the shoulder joint.  Surgery may be considered within a few weeks, especially in younger, active people, to return the shoulder to full function.  Indications for surgical treatment include the following: ? Age younger than 13 years. ? Rotator cuff tears that are complete. ? Physical therapy, rest, and anti-inflammatory medicines have been used for 6-8 weeks, with no improvement. ? Employment or sporting activity that requires constant shoulder use.  Tendinitis:  Anti-inflammatory  medicines such as ibuprofen and naproxen to help reduce pain and swelling.  A sling to help support your arm and rest your rotator cuff muscles. Long-term use of a sling is not advised. It may cause significant stiffening of the shoulder joint.  Severe tendinitis may require: ? Steroid injections into your shoulder joint. ? Physical therapy. ? Surgery.  Follow these instructions at home:  Apply ice to your injury: ? Put ice in a plastic bag. ? Place a towel between your skin and the bag. ? Leave the ice on for 20 minutes, 2-3 times a day.  If you have a shoulder immobilizer (sling and straps), wear it until told otherwise by your health care provider.  You may want to  sleep on several pillows or in a recliner at night to lessen swelling and pain.  Only take over-the-counter or prescription medicines for pain, discomfort, or fever as directed by your health care provider.  Do simple hand squeezing exercises with a soft rubber ball to decrease hand swelling. Contact a health care provider if:  Your shoulder pain increases, or new pain or numbness develops in your arm, hand, or fingers.  Your hand or fingers are colder than your other hand. Get help right away if:  Your arm, hand, or fingers are numb or tingling.  Your arm, hand, or fingers are increasingly swollen and painful, or they turn white or blue. This information is not intended to replace advice given to you by your health care provider. Make sure you discuss any questions you have with your health care provider. Document Released: 06/03/2000 Document Revised: 11/12/2015 Document Reviewed: 01/16/2013 Elsevier Interactive Patient Education  2018 [employer].

## 2018-05-08 NOTE — Progress Notes (Signed)
Chief Complaint  Patient presents with  . Follow-up   F/u with daughter  1. 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 Right shoulder pain with rotator cuff tears, bursitis and moderate to severe arthritis in PT at [facility] had a session already going 2x per week and started 05/04/18 had 1 steroid injection 04/16/18 right shoulder  3. HTN sl elevated today on norvasc 10 mg qd  3. DM 2 last A1C 7.8 cbg controlled at home    Review of Systems  Constitutional: Negative for weight loss.  HENT: Negative.  Negative for hearing loss.   Eyes: Negative for blurred vision.  Respiratory: Negative for shortness of breath.   Cardiovascular: Negative for chest pain.  Gastrointestinal: Negative for abdominal pain.  Musculoskeletal: Positive for joint pain and neck pain.       +muscle spasm neck   Skin: Negative for rash.  Neurological: Negative for headaches.  Psychiatric/Behavioral: Negative for depression.   Past Medical History:  Diagnosis Date  . Blood clotting disorder ([place])   . Cardiac murmur   . Carotid bruit    US carotid had 09/03/14 [facility] [place]   . Carpal tunnel syndrome   . Cervical radiculopathy    improved since surgery 2014/10/05   . Chicken pox   . 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 Loann Quill [place]   . Degenerative disk disease   . Diabetes ([place])   . DVT, lower extremity ([place])    left, 05/2013 on Xarelto x 1 month etiology unkown   . GERD (gastroesophageal reflux disease)   . HSV-2 infection   . Hyperlipidemia   . Hypertension   . Insomnia   . Intertrigo   . Lumbar radiculopathy    improved after steroid inj and PT  . Peripheral vascular disease ([place])   . Subdeltoid bursitis   . Vitamin D deficiency    Past Surgical History:  Procedure Laterality Date  . BACK SURGERY     cervical laminoplasty C3-C7  [facility] Dr. Sheppard Evens  . COLON SURGERY    . TUBAL LIGATION     Family History  Problem Relation Age of Onset  . Hypertension Mother   . Arthritis Mother   . Cancer Father        colon cancer dx'ed died age 16  . Hypertension Sister   . Arthritis Sister   . Cancer Sister        died in 54s brain tumor h/o lung cancer smoker died 2015-10-05   . Early death Sister   . Breast cancer Maternal Grandmother   . Cancer Maternal Grandmother        breast dx older age   . Hypertension Maternal Grandmother   . Breast cancer Other        maternal neice  . Cancer Other        died in 37s   . Hypertension Daughter   . Hypertension Daughter    Social History   Socioeconomic History  . Marital status: Divorced    Spouse name: Not on file  . Number of children: Not on file  . Years of education: Not on file  . Highest education level: Not on file  Occupational History  . Not on file  Social Needs  . Financial resource strain: Not hard at all  . Food insecurity:  Worry: Never true    Inability: Never true  . Transportation needs:    Medical: No    Non-medical: No  Tobacco Use  . Smoking status: Former Smoker    Packs/day: 1.00    Years: 45.00    Pack years: 45.00    Types: Cigarettes    Last attempt to quit: 12/18/2012    Years since quitting: 5.3  . Smokeless tobacco: Never Used  Substance and Sexual Activity  . Alcohol use: No  . Drug use: Yes    Frequency: 3.0 times per week  . Sexual activity: Not on file  Lifestyle  . Physical activity:    Days per week: 5 days    Minutes per session: 60 min  . Stress: Not at all  Relationships  . Social connections:    Talks on phone: Not on file    Gets together: Not on file    Attends religious service: Not on file    Active member of club or organization: Not on file    Attends meetings of clubs or organizations: Not on file    Relationship status: Not on file  . Intimate partner violence:    Fear of current or ex partner: Not on  file    Emotionally abused: Not on file    Physically abused: Not on file    Forced sexual activity: Not on file  Other Topics Concern  . Not on file  Social History Narrative   Divorced    2 daughters Natalie Watts comes to most appts and is Chief Executive Officer    -lives with daughter Natalie Watts    Used to work for [employer] now [employer]    Former smoker age 61 to age 72/2012 quit for sure in 2014 max 1/2 ppd FH lung cancer sister also smoker    Current Meds  Medication Sig  . acetaminophen (TYLENOL) 650 MG CR tablet Take 650 mg by mouth every 8 (eight) hours as needed.   Marland Kitchen amLODipine (NORVASC) 10 MG tablet Take 1 tablet (10 mg total) by mouth daily.  Marland Kitchen aspirin EC 81 MG tablet Take 81 mg by mouth daily.   . clotrimazole (LOTRIMIN) 1 % cream   . EPIPEN 2-PAK 0.3 MG/0.3ML SOAJ injection   . glucose blood test strip Use 4 times daily before meals.  . hydrocortisone 2.5 % cream Apply 1 application topically 2 (two) times daily as needed.   . Insulin Pen Needle (PEN NEEDLES) 32G X 6 MM MISC 1 Device by Does not apply route daily. E11.9  . LANTUS SOLOSTAR 100 UNIT/ML Solostar Pen Inject 53 Units into the skin daily after breakfast.  . meloxicam (MOBIC) 7.5 MG tablet Take 1 tablet (7.5 mg total) by mouth daily as needed for pain.  Marland Kitchen nystatin cream (MYCOSTATIN) Apply 1,000,000 Units topically as needed.  Glory Rosebush DELICA LANCETS FINE MISC 1 each by Does not apply route 4 (four) times daily as needed.  . Powders (GOLD BOND BABY POWDER EX) Apply 1 application topically as needed.   . pravastatin (PRAVACHOL) 40 MG tablet Take 1 tablet (40 mg total) by mouth daily. At night  . sitaGLIPtin (JANUVIA) 100 MG tablet Take 1 tablet (100 mg total) by mouth daily.  . valACYclovir (VALTREX) 500 MG tablet Take 1 tablet (500 mg total) by mouth 2 (two) times daily. Bid x 3 days with outbreak 24 hours before.   Allergies  Allergen Reactions  . Lisinopril Anaphylaxis and Swelling    Tongue swelling Tongue  swelling Tongue swelling Tongue swelling Tongue swelling Tongue swelling Tongue swelling Tongue swelling Tongue swelling Tongue swelling Tongue swelling Tongue swelling  . Sulfamethoxazole-Trimethoprim Other (See Comments)    Other reaction(s): Other (See Comments)  . Metformin And Related     GI sx's   . Penicillins     Childhood  . Tramadol Nausea Only and Nausea And Vomiting   Recent Results (from the past 2160 hour(s))  HM DIABETES EYE EXAM     Status: None   Collection Time: 03/29/18 12:00 AM  Result Value Ref Range   HM Diabetic Eye Exam No Retinopathy No Retinopathy    Comment: [place]    Objective  Body mass index is 31.52 kg/m. Wt Readings from Last 3 Encounters:  05/08/18 166 lb 12.8 oz (75.7 kg)  04/05/18 177 lb 6 oz (80.5 kg)  04/02/18 162 lb 11.2 oz (73.8 kg)   Temp Readings from Last 3 Encounters:  05/08/18 98.7 F (37.1 C) (Oral)  04/05/18 98.2 F (36.8 C) (Oral)  04/02/18 98.4 F (36.9 C) (Oral)   BP Readings from Last 3 Encounters:  05/08/18 (!) 148/86  04/05/18 138/76  04/02/18 130/70   Pulse Readings from Last 3 Encounters:  05/08/18 96  04/05/18 79  04/02/18 78    Physical Exam  Constitutional: She is oriented to person, place, and time. Vital signs are normal. She appears well-developed and well-nourished. She is cooperative.  HENT:  Head: Normocephalic and atraumatic.  Mouth/Throat: Oropharynx is clear and moist.  Eyes: Pupils are equal, round, and reactive to light. Conjunctivae are normal.  Cardiovascular: Normal rate, regular rhythm and normal heart sounds.  Pulmonary/Chest: Effort normal and breath sounds normal.  Musculoskeletal:       Cervical back: She exhibits tenderness.  Neurological: She is alert and oriented to person, place, and time. Gait normal.  Skin: Skin is warm, dry and intact.  Psychiatric: She has a normal mood and affect. Her speech is normal and behavior is normal. Judgment and thought content normal.  Cognition and memory are normal.  Nursing note and vitals reviewed.   Assessment   1. Cervical radiculopathy and right shoulder pain with rotator cuff tears x 2, bursitis, moderate to severe arthritis  2. HTN repeat BP 126/80 3. DM 2 A1C 7.8, 7.1 today  4. HM Plan   1. Order MRI neck f/u [facility] Dr. Roland Rack  Off narcotics  F/u [facility] 2x per week  2. Cont norvasc 10 mg qd  3. lantus 53 units, januvia 100 mg qd  4.  Flu shot utd  Given3/3 hep B vaccines today  pna 23 vx had 11/27/08  prevnarhad  Will disc shingrix in the future  Td had 02/14/11   apptGI Dr. Darien Ramus [phone] colonoscopy 01/2018  Consider CT chest with chronic smoking hx quit 2014 former smoker 20-30 years 1 pk lasts 2 days   Out of age window pap  mammo 02/20/18 neg   DEXA 10/30/13 osteopenia will rec vit D3 1000-2000 IU qdand calcium 600 mg bid TBST 10/09/17 neg 10/12/17   Provider: Dr. Olivia Mackie McLean-Scocuzza-Internal Medicine

## 2018-05-08 NOTE — Telephone Encounter (Signed)
I have sent to patient's requested pharmacy.  

## 2018-05-08 NOTE — Progress Notes (Signed)
Pre visit review using our clinic review tool, if applicable. No additional management support is needed unless otherwise documented below in the visit note. 

## 2018-05-08 NOTE — Telephone Encounter (Signed)
Copied from [place] [id number]. Topic: Quick Communication - Rx Refill/Question >> May 08, 2018  4:25 PM Margot Ables wrote: Medication: pt needing new meter - she would like RX for One Touch Ultra II meter Pt was in pharmacy and had [place] call office - Dee states fax was sent earlier  Preferred Pharmacy (with phone number or street name): [facility] [address] [place] [phone] (Phone) [phone] (Fax)

## 2018-05-10 ENCOUNTER — Other Ambulatory Visit: Payer: Self-pay | Admitting: Internal Medicine

## 2018-05-10 DIAGNOSIS — M25511 Pain in right shoulder: Secondary | ICD-10-CM

## 2018-05-10 MED ORDER — MELOXICAM 7.5 MG PO TABS: 7.5000 mg | ORAL_TABLET | Freq: Every day | ORAL | 2 refills | Status: DC | PRN

## 2018-05-15 ENCOUNTER — Ambulatory Visit: Payer: Medicare Other | Admitting: Podiatry

## 2018-05-15 ENCOUNTER — Encounter: Payer: Self-pay | Admitting: Podiatry

## 2018-05-15 DIAGNOSIS — B351 Tinea unguium: Secondary | ICD-10-CM

## 2018-05-15 DIAGNOSIS — M79676 Pain in unspecified toe(s): Secondary | ICD-10-CM

## 2018-05-15 DIAGNOSIS — E0842 Diabetes mellitus due to underlying condition with diabetic polyneuropathy: Secondary | ICD-10-CM

## 2018-05-16 NOTE — Progress Notes (Signed)
   SUBJECTIVE Patient with a history of diabetes mellitus presents to office today complaining of elongated, thickened nails that cause pain while ambulating in shoes. She is unable to trim her own nails. Patient is here for further evaluation and treatment.   Past Medical History:  Diagnosis Date  . Blood clotting disorder ([place])   . Cardiac murmur   . Carotid bruit    US carotid had 09/03/14 [facility] [place]   . Carpal tunnel syndrome   . Cervical radiculopathy    improved since surgery 2016   . Chicken pox   . 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 Loann Quill [place]   . Degenerative disk disease   . Diabetes ([place])   . DVT, lower extremity ([place])    left, 05/2013 on Xarelto x 1 month etiology unkown   . GERD (gastroesophageal reflux disease)   . HSV-2 infection   . Hyperlipidemia   . Hypertension   . Insomnia   . Intertrigo   . Lumbar radiculopathy    improved after steroid inj and PT  . Peripheral vascular disease ([place])   . Subdeltoid bursitis   . Vitamin D deficiency     OBJECTIVE General Patient is awake, alert, and oriented x 3 and in no acute distress. Derm Skin is dry and supple bilateral. Negative open lesions or macerations. Remaining integument unremarkable. Nails are tender, long, thickened and dystrophic with subungual debris, consistent with onychomycosis, 1-5 bilateral. No signs of infection noted. Vasc  DP and PT pedal pulses palpable bilaterally. Temperature gradient within normal limits.  Neuro Epicritic and protective threshold sensation diminished bilaterally.  Musculoskeletal Exam No symptomatic pedal deformities noted bilateral. Muscular strength within normal limits.  ASSESSMENT 1. Diabetes Mellitus w/ peripheral neuropathy 2. Onychomycosis of nail due to dermatophyte bilateral 3. Pain in foot bilateral  PLAN OF CARE 1. Patient evaluated today. 2. Instructed to maintain good pedal hygiene and foot care.  Stressed importance of controlling blood sugar.  3. Mechanical debridement of nails 1-5 bilaterally performed using a nail nipper. Filed with dremel without incident.  4. Return to clinic in 3 mos.     Edrick Kins, DPM [facility]  Dr. Edrick Kins, [address]                                        [place], [place] [address]                Office [phone]  Fax [phone]

## 2018-05-23 ENCOUNTER — Ambulatory Visit (HOSPITAL_COMMUNITY)
Admission: RE | Admit: 2018-05-23 | Discharge: 2018-05-23 | Disposition: A | Discharge status: Home / Self Care | Payer: Medicare Other | Source: Ambulatory Visit | Attending: Internal Medicine | Admitting: Internal Medicine

## 2018-05-23 DIAGNOSIS — Z981 Arthrodesis status: Secondary | ICD-10-CM | POA: Diagnosis not present

## 2018-05-23 DIAGNOSIS — M5410 Radiculopathy, site unspecified: Secondary | ICD-10-CM | POA: Diagnosis present

## 2018-05-23 DIAGNOSIS — I639 Cerebral infarction, unspecified: Secondary | ICD-10-CM | POA: Insufficient documentation

## 2018-06-06 ENCOUNTER — Other Ambulatory Visit: Payer: Self-pay | Admitting: Internal Medicine

## 2018-06-06 DIAGNOSIS — E1165 Type 2 diabetes mellitus with hyperglycemia: Secondary | ICD-10-CM

## 2018-06-06 MED ORDER — LANTUS SOLOSTAR 100 UNIT/ML ~~LOC~~ SOPN: 53.0000 [IU] | PEN_INJECTOR | Freq: Every day | SUBCUTANEOUS | 11 refills | Status: DC

## 2018-07-13 ENCOUNTER — Ambulatory Visit: Payer: Medicare Other | Admitting: Internal Medicine

## 2018-07-13 ENCOUNTER — Encounter: Payer: Self-pay | Admitting: Internal Medicine

## 2018-07-13 VITALS — BP 140/80 | HR 95 | Temp 98.7°F | Resp 16 | Ht 61.0 in | Wt 167.2 lb

## 2018-07-13 DIAGNOSIS — J069 Acute upper respiratory infection, unspecified: Secondary | ICD-10-CM

## 2018-07-13 DIAGNOSIS — M1711 Unilateral primary osteoarthritis, right knee: Secondary | ICD-10-CM

## 2018-07-13 MED ORDER — DICLOFENAC SODIUM 1 % TD GEL: 2.0000 g | Freq: Four times a day (QID) | TRANSDERMAL | 11 refills | Status: DC

## 2018-07-13 MED ORDER — AZITHROMYCIN 250 MG PO TABS: ORAL_TABLET | ORAL | 0 refills | Status: DC

## 2018-07-13 NOTE — Patient Instructions (Signed)
I like Mucinex DM green label or Robitussin DM  Cough drops  Tea with honey and lemon Rest  Increase hydration    Arthritis Arthritis is a term that is commonly used to refer to joint pain or joint disease. There are more than 100 types of arthritis. What are the causes? The most common cause of this condition is wear and tear of a joint. Other causes include:  Gout.  Inflammation of a joint.  An infection of a joint.  Sprains and other injuries near the joint.  A drug reaction or allergic reaction. In some cases, the cause may not be known. What are the signs or symptoms? The main symptom of this condition is pain in the joint with movement. Other symptoms include:  Redness, swelling, or stiffness at a joint.  Warmth coming from the joint.  Fever.  Overall feeling of illness. How is this diagnosed? This condition may be diagnosed with a physical exam and tests, including:  Blood tests.  Urine tests.  Imaging tests, such as MRI, X-rays, or a CT scan. Sometimes, fluid is removed from a joint for testing. How is this treated? Treatment for this condition may involve:  Treatment of the cause, if it is known.  Rest.  Raising (elevating) the joint.  Applying cold or hot packs to the joint.  Medicines to improve symptoms and reduce inflammation.  Injections of a steroid such as cortisone into the joint to help reduce pain and inflammation. Depending on the cause of your arthritis, you may need to make lifestyle changes to reduce stress on your joint. These changes may include exercising more and losing weight. Follow these instructions at home: Medicines  Take over-the-counter and prescription medicines only as told by your health care provider.  Do not take aspirin to relieve pain if gout is suspected. Activity  Rest your joint if told by your health care provider. Rest is important when your disease is active and your joint feels painful, swollen, or  stiff.  Avoid activities that make the pain worse. It is important to balance activity with rest.  Exercise your joint regularly with range-of-motion exercises as told by your health care provider. Try doing low-impact exercise, such as: ? Swimming. ? Water aerobics. ? Biking. ? Walking. Joint Care   If your joint is swollen, keep it elevated if told by your health care provider.  If your joint feels stiff in the morning, try taking a warm shower.  If directed, apply heat to the joint. If you have diabetes, do not apply heat without permission from your health care provider. ? Put a towel between the joint and the hot pack or heating pad. ? Leave the heat on the area for 20-30 minutes.  If directed, apply ice to the joint: ? Put ice in a plastic bag. ? Place a towel between your skin and the bag. ? Leave the ice on for 20 minutes, 2-3 times per day.  Keep all follow-up visits as told by your health care provider. This is important. Contact a health care provider if:  The pain gets worse.  You have a fever. Get help right away if:  You develop severe joint pain, swelling, or redness.  Many joints become painful and swollen.  You develop severe back pain.  You develop severe weakness in your leg.  You cannot control your bladder or bowels. This information is not intended to replace advice given to you by your health care provider. Make sure you discuss  any questions you have with your health care provider. Document Released: 07/14/2004 Document Revised: 11/12/2015 Document Reviewed: 09/01/2014 Elsevier Interactive Patient Education  2019 Else[employer] Inc[employer]  Cough, Adult  Coughing is a reflex that clears your throat and your airways. Coughing helps to heal and protect your lungs. It is normal to cough occasionally, but a cough that happens with other symptoms or lasts a long time may be a sign of a condition that needs treatment. A cough may last only 2-3 weeks (acute), or  it may last longer than 8 weeks (chronic). What are the causes? Coughing is commonly caused by:  Breathing in substances that irritate your lungs.  A viral or bacterial respiratory infection.  Allergies.  Asthma.  Postnasal drip.  Smoking.  Acid backing up from the stomach into the esophagus (gastroesophageal reflux).  Certain medicines.  Chronic lung problems, including COPD (or rarely, lung cancer).  Other medical conditions such as heart failure. Follow these instructions at home: Pay attention to any changes in your symptoms. Take these actions to help with your discomfort:  Take medicines only as told by your health care provider. ? If you were prescribed an antibiotic medicine, take it as told by your health care provider. Do not stop taking the antibiotic even if you start to feel better. ? Talk with your health care provider before you take a cough suppressant medicine.  Drink enough fluid to keep your urine clear or pale yellow.  If the air is dry, use a cold steam vaporizer or humidifier in your bedroom or your home to help loosen secretions.  Avoid anything that causes you to cough at work or at home.  If your cough is worse at night, try sleeping in a semi-upright position.  Avoid cigarette smoke. If you smoke, quit smoking. If you need help quitting, ask your health care provider.  Avoid caffeine.  Avoid alcohol.  Rest as needed. Contact a health care provider if:  You have new symptoms.  You cough up pus.  Your cough does not get better after 2-3 weeks, or your cough gets worse.  You cannot control your cough with suppressant medicines and you are losing sleep.  You develop pain that is getting worse or pain that is not controlled with pain medicines.  You have a fever.  You have unexplained weight loss.  You have night sweats. Get help right away if:  You cough up blood.  You have difficulty breathing.  Your heartbeat is very  fast. This information is not intended to replace advice given to you by your health care provider. Make sure you discuss any questions you have with your health care provider. Document Released: 12/03/2010 Document Revised: 11/12/2015 Document Reviewed: 08/13/2014 Elsevier Interactive Patient Education  2019 [employer].

## 2018-07-13 NOTE — Progress Notes (Signed)
Chief Complaint  Patient presents with  . Cough    x 2 weeks started as a cold , just has cough in the mroning hours yellow thich mucus, then is usually dry.   Sick visit  1. C/o cough with thick phlegm and nasal congestion x 2 weeks cough with yellow to white sputum and grandkid has been sick but no other sick contacts tried Mucinex otc. No fever, no sob  2. Right knee pain worse with immobilization takes tylenol prn   Moving to [place] soon with daughter   Review of Systems  Constitutional: Negative for weight loss.  HENT: Positive for congestion. Negative for hearing loss.   Eyes: Negative for blurred vision.  Respiratory: Positive for cough and sputum production.   Cardiovascular: Negative for chest pain.  Musculoskeletal: Positive for joint pain.  Skin: Negative for rash.  Neurological: Negative for dizziness and headaches.  Psychiatric/Behavioral: Negative for depression.   Past Medical History:  Diagnosis Date  . Blood clotting disorder ([place])   . Cardiac murmur   . Carotid bruit    US carotid had 09/03/14 [facility] [place]   . Carpal tunnel syndrome   . Cervical radiculopathy    improved since surgery 29-Aug-2014   . Chicken pox   . 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 Loann Quill [facility]   . Degenerative disk disease   . Diabetes ([place])   . DVT, lower extremity ([place])    left, 05/2013 on Xarelto x 1 month etiology unkown   . GERD (gastroesophageal reflux disease)   . HSV-2 infection   . Hyperlipidemia   . Hypertension   . Insomnia   . Intertrigo   . Lumbar radiculopathy    improved after steroid inj and PT  . Peripheral vascular disease ([place])   . Subdeltoid bursitis   . Vitamin D deficiency    Past Surgical History:  Procedure Laterality Date  . 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 COLON SURGERY    . TUBAL LIGATION     Family History  Problem Relation Age of Onset  .  Hypertension Mother   . Arthritis Mother   . Cancer Father        colon cancer dx'ed died age 24  . Hypertension Sister   . Arthritis Sister   . Cancer Sister        died in 26s brain tumor h/o lung cancer smoker died 08/29/15   . Early death Sister   . Breast cancer Maternal Grandmother   . Cancer Maternal Grandmother        breast dx older age   . Hypertension Maternal Grandmother   . Breast cancer Other        maternal neice  . Cancer Other        died in 84s   . Hypertension Daughter   . Hypertension Daughter    Social History   Socioeconomic History  . Marital status: Divorced    Spouse name: Not on file  . Number of children: Not on file  . Years of education: Not on file  . Highest education level: Not on file  Occupational History  . Not on file  Social Needs  . Financial resource strain: Not hard at all  . Food insecurity:    Worry: Never true    Inability: Never true  . Transportation needs:    Medical: No  Non-medical: No  Tobacco Use  . Smoking status: Former Smoker    Packs/day: 1.00    Years: 45.00    Pack years: 45.00    Types: Cigarettes    Last attempt to quit: 12/18/2012    Years since quitting: 5.5  . Smokeless tobacco: Never Used  Substance and Sexual Activity  . Alcohol use: No  . Drug use: Yes    Frequency: 3.0 times per week  . Sexual activity: Not on file  Lifestyle  . Physical activity:    Days per week: 5 days    Minutes per session: 60 min  . Stress: Not at all  Relationships  . Social connections:    Talks on phone: Not on file    Gets together: Not on file    Attends religious service: Not on file    Active member of club or organization: Not on file    Attends meetings of clubs or organizations: Not on file    Relationship status: Not on file  . Intimate partner violence:    Fear of current or ex partner: Not on file    Emotionally abused: Not on file    Physically abused: Not on file    Forced sexual activity: Not on file   Other Topics Concern  . Not on file  Social History Narrative   Divorced    2 daughters [name] comes to most appts and is Chief Executive Officer    -lives with daughter [name]    Used to work for [employer] now [employer]    Former smoker age 39 to age 73/2012 quit for sure in 2014 max 1/2 ppd FH lung cancer sister also smoker    Current Meds  Medication Sig  . acetaminophen (TYLENOL) 650 MG CR tablet Take 650 mg by mouth every 8 (eight) hours as needed.   Marland Kitchen amLODipine (NORVASC) 10 MG tablet Take 1 tablet (10 mg total) by mouth daily.  Marland Kitchen aspirin EC 81 MG tablet Take 81 mg by mouth daily.   . Blood Glucose Monitoring Suppl (ONE TOUCH ULTRA 2) w/Device KIT Used to check blood sugars daily.  . clotrimazole (LOTRIMIN) 1 % cream   . EPIPEN 2-PAK 0.3 MG/0.3ML SOAJ injection   . glucose blood test strip Use 4 times daily before meals.  . hydrocortisone 2.5 % cream Apply 1 application topically 2 (two) times daily as needed.   . Insulin Pen Needle (PEN NEEDLES) 32G X 6 MM MISC 1 Device by Does not apply route daily. E11.9  . Lancets (ONETOUCH DELICA PLUS [id number]) MISC 1 Device by Does not apply route 2 (two) times daily. E11.9  . LANTUS SOLOSTAR 100 UNIT/ML Solostar Pen Inject 53 Units into the skin daily after breakfast.  . meloxicam (MOBIC) 7.5 MG tablet Take 1 tablet (7.5 mg total) by mouth daily as needed for pain.  Marland Kitchen nystatin cream (MYCOSTATIN) Apply 1,000,000 Units topically as needed.  Glory Rosebush DELICA LANCETS FINE MISC 1 each by Does not apply route 4 (four) times daily as needed.  . Powders (GOLD BOND BABY POWDER EX) Apply 1 application topically as needed.   . pravastatin (PRAVACHOL) 40 MG tablet Take 1 tablet (40 mg total) by mouth daily. At night  . sitaGLIPtin (JANUVIA) 100 MG tablet Take 1 tablet (100 mg total) by mouth daily.  . valACYclovir (VALTREX) 500 MG tablet Take 1 tablet (500 mg total) by mouth 2 (two) times daily. Bid x 3 days with outbreak 24 hours before.  Allergies   Allergen Reactions  . Lisinopril Anaphylaxis and Swelling    Tongue swelling Tongue swelling Tongue swelling Tongue swelling Tongue swelling Tongue swelling Tongue swelling Tongue swelling Tongue swelling Tongue swelling Tongue swelling Tongue swelling  . Sulfamethoxazole-Trimethoprim Other (See Comments)    Other reaction(s): Other (See Comments)  . Metformin And Related     GI sx's   . Penicillins     Childhood  . Tramadol Nausea Only and Nausea And Vomiting   Recent Results (from the past 2160 hour(s))  POCT HgB A1C     Status: Abnormal   Collection Time: 05/08/18  8:50 AM  Result Value Ref Range   Hemoglobin A1C 7.1 (A) 4.0 - 5.6 %   HbA1c POC (<> result, manual entry)     HbA1c, POC (prediabetic range)     HbA1c, POC (controlled diabetic range)     Objective  Body mass index is 31.6 kg/m. Wt Readings from Last 3 Encounters:  07/13/18 167 lb 4 oz (75.9 kg)  05/08/18 166 lb 12.8 oz (75.7 kg)  04/05/18 177 lb 6 oz (80.5 kg)   Temp Readings from Last 3 Encounters:  07/13/18 98.7 F (37.1 C) (Oral)  05/08/18 98.7 F (37.1 C) (Oral)  04/05/18 98.2 F (36.8 C) (Oral)   BP Readings from Last 3 Encounters:  07/13/18 140/80  05/08/18 126/80  04/05/18 138/76   Pulse Readings from Last 3 Encounters:  07/13/18 95  05/08/18 96  04/05/18 79    Physical Exam Vitals signs and nursing note reviewed.  Constitutional:      Appearance: Normal appearance. She is well-developed and well-groomed. She is obese.  HENT:     Head: Normocephalic and atraumatic.     Nose: Nose normal.     Mouth/Throat:     Mouth: Mucous membranes are moist.     Pharynx: Oropharynx is clear.  Eyes:     Conjunctiva/sclera: Conjunctivae normal.     Pupils: Pupils are equal, round, and reactive to light.  Cardiovascular:     Rate and Rhythm: Normal rate and regular rhythm.     Heart sounds: Murmur present.  Pulmonary:     Effort: Pulmonary effort is normal.     Breath sounds: Normal  breath sounds.  Skin:    General: Skin is warm and dry.  Neurological:     General: No focal deficit present.     Mental Status: She is alert and oriented to person, place, and time. Mental status is at baseline.     Gait: Gait normal.  Psychiatric:        Attention and Perception: Attention and perception normal.        Mood and Affect: Mood and affect normal.        Speech: Speech normal.        Behavior: Behavior normal. Behavior is cooperative.        Thought Content: Thought content normal.        Cognition and Memory: Cognition and memory normal.        Judgment: Judgment normal.     Assessment   1. Cough likely URI related  2. Right knee pain likely oa  3. HM Plan   1. zpack cold handout  2. Declines Xray for now  Trial voltaren gel  3. Flu shot utd  Given hep B 3/3 shots  pna 23 vx had 11/27/08 consider repeat in future  prevnarhad Will disc shingrix in the future Td had 02/14/11  apptGI Dr. Darien Ramus (  [phone] colonoscopy 01/2018  Consider CT chest with chronic smoking hx quit 2045frmer smoker 20-30 years 1 pk lasts 2 days  Out of age window pap  mammo 02/20/18 neg  DEXA 10/30/13 osteopenia will rec vit D3 1000-2000 IU qdand calcium 600 mg bid TBST 10/09/17 neg 10/12/17 Provider: Dr. TOlivia MackieMcLean-Scocuzza-[facility]

## 2018-07-25 ENCOUNTER — Other Ambulatory Visit: Payer: Self-pay | Admitting: Internal Medicine

## 2018-07-25 DIAGNOSIS — E1165 Type 2 diabetes mellitus with hyperglycemia: Secondary | ICD-10-CM

## 2018-07-25 MED ORDER — SITAGLIPTIN PHOSPHATE 100 MG PO TABS: 100.0000 mg | ORAL_TABLET | Freq: Every day | ORAL | 1 refills | Status: DC

## 2018-07-25 NOTE — Telephone Encounter (Signed)
Copied from [place] [id number]. Topic: Quick Communication - Rx Refill/Question >> Jul 25, 2018  3:31 PM Yvette Rack wrote: Medication: sitaGLIPtin (JANUVIA) 100 MG tablet   Has the patient contacted their pharmacy? No. Pt has moved to [place] two weeks ago  (Agent: If no, request that the patient contact the pharmacy for the refill.) (Agent: If yes, when and what did the pharmacy advise?)  Preferred Pharmacy (with phone number or street name): [id number] [place], [place] - [place] ([address]) [phone] (Phone) [phone] (Fax)    Agent: Please be advised that RX refills may take up to 3 business days. We ask that you follow-up with your pharmacy.

## 2018-07-25 NOTE — Telephone Encounter (Signed)
Approved 90 days with one refill. Patient has moved to [place] and will establish with a new PCP before medication runs out.

## 2018-07-26 ENCOUNTER — Other Ambulatory Visit: Payer: Self-pay | Admitting: Internal Medicine

## 2018-07-26 DIAGNOSIS — Z794 Long term (current) use of insulin: Principal | ICD-10-CM

## 2018-07-26 DIAGNOSIS — E1165 Type 2 diabetes mellitus with hyperglycemia: Secondary | ICD-10-CM

## 2018-07-26 DIAGNOSIS — E1101 Type 2 diabetes mellitus with hyperosmolarity with coma: Secondary | ICD-10-CM

## 2018-07-26 MED ORDER — GLUCOSE BLOOD VI STRP: ORAL_STRIP | 4 refills | Status: DC

## 2018-07-31 ENCOUNTER — Ambulatory Visit: Payer: Medicare Other | Admitting: Internal Medicine

## 2018-08-14 ENCOUNTER — Ambulatory Visit: Payer: Medicare Other | Admitting: Podiatry

## 2018-08-14 ENCOUNTER — Telehealth: Payer: Self-pay

## 2018-08-14 NOTE — Telephone Encounter (Signed)
Copied from [place] [id number]. Topic: General - Other >> Aug 14, 2018  1:32 PM Windy Kalata wrote: Reason for CRM: Natalie Watts would like to know if her medical records were sent to her new provider? Dr. Etta Grandchild Phone is [phone] Fax is [phone]  Please advise [phone]

## 2019-01-03 ENCOUNTER — Ambulatory Visit: Payer: Medicare Other

## 2019-01-03 ENCOUNTER — Ambulatory Visit: Payer: Medicare Other | Admitting: Internal Medicine

## 2019-01-09 ENCOUNTER — Other Ambulatory Visit: Payer: Self-pay | Admitting: Internal Medicine

## 2019-01-09 DIAGNOSIS — I10 Essential (primary) hypertension: Secondary | ICD-10-CM

## 2019-01-09 MED ORDER — AMLODIPINE BESYLATE 10 MG PO TABS: 10.0000 mg | ORAL_TABLET | Freq: Every day | ORAL | 2 refills | Status: DC

## 2019-06-24 DIAGNOSIS — R2689 Other abnormalities of gait and mobility: Secondary | ICD-10-CM | POA: Diagnosis not present

## 2019-06-24 DIAGNOSIS — R293 Abnormal posture: Secondary | ICD-10-CM | POA: Diagnosis not present

## 2019-06-24 DIAGNOSIS — M2041 Other hammer toe(s) (acquired), right foot: Secondary | ICD-10-CM | POA: Diagnosis not present

## 2019-06-24 DIAGNOSIS — M6281 Muscle weakness (generalized): Secondary | ICD-10-CM | POA: Diagnosis not present

## 2019-06-24 DIAGNOSIS — M21371 Foot drop, right foot: Secondary | ICD-10-CM | POA: Diagnosis not present

## 2019-06-24 DIAGNOSIS — E1151 Type 2 diabetes mellitus with diabetic peripheral angiopathy without gangrene: Secondary | ICD-10-CM | POA: Diagnosis not present

## 2019-06-24 DIAGNOSIS — M48062 Spinal stenosis, lumbar region with neurogenic claudication: Secondary | ICD-10-CM | POA: Diagnosis not present

## 2019-06-24 DIAGNOSIS — M2042 Other hammer toe(s) (acquired), left foot: Secondary | ICD-10-CM | POA: Diagnosis not present

## 2019-06-26 DIAGNOSIS — I2510 Atherosclerotic heart disease of native coronary artery without angina pectoris: Secondary | ICD-10-CM | POA: Diagnosis not present

## 2019-06-26 DIAGNOSIS — E119 Type 2 diabetes mellitus without complications: Secondary | ICD-10-CM | POA: Diagnosis not present

## 2019-06-26 DIAGNOSIS — I10 Essential (primary) hypertension: Secondary | ICD-10-CM | POA: Diagnosis not present

## 2019-06-26 DIAGNOSIS — R2689 Other abnormalities of gait and mobility: Secondary | ICD-10-CM | POA: Diagnosis not present

## 2019-06-26 DIAGNOSIS — M6281 Muscle weakness (generalized): Secondary | ICD-10-CM | POA: Diagnosis not present

## 2019-06-26 DIAGNOSIS — I739 Peripheral vascular disease, unspecified: Secondary | ICD-10-CM | POA: Diagnosis not present

## 2019-06-26 DIAGNOSIS — R011 Cardiac murmur, unspecified: Secondary | ICD-10-CM | POA: Diagnosis not present

## 2019-06-26 DIAGNOSIS — E785 Hyperlipidemia, unspecified: Secondary | ICD-10-CM | POA: Diagnosis not present

## 2019-06-26 DIAGNOSIS — R293 Abnormal posture: Secondary | ICD-10-CM | POA: Diagnosis not present

## 2019-06-26 DIAGNOSIS — M48062 Spinal stenosis, lumbar region with neurogenic claudication: Secondary | ICD-10-CM | POA: Diagnosis not present

## 2019-07-01 DIAGNOSIS — M6281 Muscle weakness (generalized): Secondary | ICD-10-CM | POA: Diagnosis not present

## 2019-07-01 DIAGNOSIS — M48062 Spinal stenosis, lumbar region with neurogenic claudication: Secondary | ICD-10-CM | POA: Diagnosis not present

## 2019-07-01 DIAGNOSIS — R2689 Other abnormalities of gait and mobility: Secondary | ICD-10-CM | POA: Diagnosis not present

## 2019-07-01 DIAGNOSIS — R293 Abnormal posture: Secondary | ICD-10-CM | POA: Diagnosis not present

## 2019-07-04 DIAGNOSIS — R293 Abnormal posture: Secondary | ICD-10-CM | POA: Diagnosis not present

## 2019-07-04 DIAGNOSIS — M6281 Muscle weakness (generalized): Secondary | ICD-10-CM | POA: Diagnosis not present

## 2019-07-04 DIAGNOSIS — R2689 Other abnormalities of gait and mobility: Secondary | ICD-10-CM | POA: Diagnosis not present

## 2019-07-04 DIAGNOSIS — M48062 Spinal stenosis, lumbar region with neurogenic claudication: Secondary | ICD-10-CM | POA: Diagnosis not present

## 2019-07-09 DIAGNOSIS — R2689 Other abnormalities of gait and mobility: Secondary | ICD-10-CM | POA: Diagnosis not present

## 2019-07-09 DIAGNOSIS — M48062 Spinal stenosis, lumbar region with neurogenic claudication: Secondary | ICD-10-CM | POA: Diagnosis not present

## 2019-07-09 DIAGNOSIS — R293 Abnormal posture: Secondary | ICD-10-CM | POA: Diagnosis not present

## 2019-07-09 DIAGNOSIS — M6281 Muscle weakness (generalized): Secondary | ICD-10-CM | POA: Diagnosis not present

## 2019-07-11 DIAGNOSIS — M48062 Spinal stenosis, lumbar region with neurogenic claudication: Secondary | ICD-10-CM | POA: Diagnosis not present

## 2019-07-11 DIAGNOSIS — M6281 Muscle weakness (generalized): Secondary | ICD-10-CM | POA: Diagnosis not present

## 2019-07-11 DIAGNOSIS — R293 Abnormal posture: Secondary | ICD-10-CM | POA: Diagnosis not present

## 2019-07-11 DIAGNOSIS — R2689 Other abnormalities of gait and mobility: Secondary | ICD-10-CM | POA: Diagnosis not present

## 2019-07-15 DIAGNOSIS — M6281 Muscle weakness (generalized): Secondary | ICD-10-CM | POA: Diagnosis not present

## 2019-07-15 DIAGNOSIS — R2689 Other abnormalities of gait and mobility: Secondary | ICD-10-CM | POA: Diagnosis not present

## 2019-07-15 DIAGNOSIS — M48062 Spinal stenosis, lumbar region with neurogenic claudication: Secondary | ICD-10-CM | POA: Diagnosis not present

## 2019-07-15 DIAGNOSIS — R293 Abnormal posture: Secondary | ICD-10-CM | POA: Diagnosis not present

## 2019-07-18 DIAGNOSIS — M48062 Spinal stenosis, lumbar region with neurogenic claudication: Secondary | ICD-10-CM | POA: Diagnosis not present

## 2019-07-18 DIAGNOSIS — M6281 Muscle weakness (generalized): Secondary | ICD-10-CM | POA: Diagnosis not present

## 2019-07-18 DIAGNOSIS — R293 Abnormal posture: Secondary | ICD-10-CM | POA: Diagnosis not present

## 2019-07-18 DIAGNOSIS — R2689 Other abnormalities of gait and mobility: Secondary | ICD-10-CM | POA: Diagnosis not present

## 2019-07-20 DIAGNOSIS — G4733 Obstructive sleep apnea (adult) (pediatric): Secondary | ICD-10-CM | POA: Diagnosis not present

## 2019-07-26 DIAGNOSIS — R2689 Other abnormalities of gait and mobility: Secondary | ICD-10-CM | POA: Diagnosis not present

## 2019-07-26 DIAGNOSIS — M6281 Muscle weakness (generalized): Secondary | ICD-10-CM | POA: Diagnosis not present

## 2019-07-26 DIAGNOSIS — M48062 Spinal stenosis, lumbar region with neurogenic claudication: Secondary | ICD-10-CM | POA: Diagnosis not present

## 2019-07-26 DIAGNOSIS — R293 Abnormal posture: Secondary | ICD-10-CM | POA: Diagnosis not present

## 2019-07-30 DIAGNOSIS — I361 Nonrheumatic tricuspid (valve) insufficiency: Secondary | ICD-10-CM | POA: Diagnosis not present

## 2019-07-30 DIAGNOSIS — I340 Nonrheumatic mitral (valve) insufficiency: Secondary | ICD-10-CM | POA: Diagnosis not present

## 2019-07-30 DIAGNOSIS — I70213 Atherosclerosis of native arteries of extremities with intermittent claudication, bilateral legs: Secondary | ICD-10-CM | POA: Diagnosis not present

## 2019-08-01 DIAGNOSIS — M48062 Spinal stenosis, lumbar region with neurogenic claudication: Secondary | ICD-10-CM | POA: Diagnosis not present

## 2019-08-01 DIAGNOSIS — M6281 Muscle weakness (generalized): Secondary | ICD-10-CM | POA: Diagnosis not present

## 2019-08-01 DIAGNOSIS — R2689 Other abnormalities of gait and mobility: Secondary | ICD-10-CM | POA: Diagnosis not present

## 2019-08-01 DIAGNOSIS — R293 Abnormal posture: Secondary | ICD-10-CM | POA: Diagnosis not present

## 2019-08-05 DIAGNOSIS — M48062 Spinal stenosis, lumbar region with neurogenic claudication: Secondary | ICD-10-CM | POA: Diagnosis not present

## 2019-08-05 DIAGNOSIS — M6281 Muscle weakness (generalized): Secondary | ICD-10-CM | POA: Diagnosis not present

## 2019-08-05 DIAGNOSIS — R293 Abnormal posture: Secondary | ICD-10-CM | POA: Diagnosis not present

## 2019-08-05 DIAGNOSIS — R2689 Other abnormalities of gait and mobility: Secondary | ICD-10-CM | POA: Diagnosis not present

## 2019-08-07 DIAGNOSIS — R011 Cardiac murmur, unspecified: Secondary | ICD-10-CM | POA: Diagnosis not present

## 2019-08-07 DIAGNOSIS — E119 Type 2 diabetes mellitus without complications: Secondary | ICD-10-CM | POA: Diagnosis not present

## 2019-08-07 DIAGNOSIS — I2510 Atherosclerotic heart disease of native coronary artery without angina pectoris: Secondary | ICD-10-CM | POA: Diagnosis not present

## 2019-08-07 DIAGNOSIS — E785 Hyperlipidemia, unspecified: Secondary | ICD-10-CM | POA: Diagnosis not present

## 2019-08-07 DIAGNOSIS — I10 Essential (primary) hypertension: Secondary | ICD-10-CM | POA: Diagnosis not present

## 2019-08-07 DIAGNOSIS — I739 Peripheral vascular disease, unspecified: Secondary | ICD-10-CM | POA: Diagnosis not present

## 2019-08-13 DIAGNOSIS — R293 Abnormal posture: Secondary | ICD-10-CM | POA: Diagnosis not present

## 2019-08-13 DIAGNOSIS — M48062 Spinal stenosis, lumbar region with neurogenic claudication: Secondary | ICD-10-CM | POA: Diagnosis not present

## 2019-08-13 DIAGNOSIS — R2689 Other abnormalities of gait and mobility: Secondary | ICD-10-CM | POA: Diagnosis not present

## 2019-08-13 DIAGNOSIS — M6281 Muscle weakness (generalized): Secondary | ICD-10-CM | POA: Diagnosis not present

## 2019-08-18 DIAGNOSIS — G4733 Obstructive sleep apnea (adult) (pediatric): Secondary | ICD-10-CM | POA: Diagnosis not present

## 2019-08-28 DIAGNOSIS — I10 Essential (primary) hypertension: Secondary | ICD-10-CM | POA: Diagnosis not present

## 2019-08-28 DIAGNOSIS — G4733 Obstructive sleep apnea (adult) (pediatric): Secondary | ICD-10-CM | POA: Diagnosis not present

## 2019-08-28 DIAGNOSIS — E119 Type 2 diabetes mellitus without complications: Secondary | ICD-10-CM | POA: Diagnosis not present

## 2019-08-29 ENCOUNTER — Telehealth: Payer: Self-pay | Admitting: Internal Medicine

## 2019-08-29 NOTE — Telephone Encounter (Signed)
She is not longer my patient and lives in [place]   Inform pharmacy  Have not seen in >1 year

## 2019-08-29 NOTE — Telephone Encounter (Signed)
Pt pharmacy requesting refills for one touch lancets. Patient has not been seen in over a year and you are not documented as PCP.   Is this still your patient?

## 2019-08-29 NOTE — Telephone Encounter (Signed)
Informed pharmacy.

## 2019-09-03 DIAGNOSIS — B351 Tinea unguium: Secondary | ICD-10-CM | POA: Diagnosis not present

## 2019-09-03 DIAGNOSIS — M25572 Pain in left ankle and joints of left foot: Secondary | ICD-10-CM | POA: Diagnosis not present

## 2019-09-03 DIAGNOSIS — E1142 Type 2 diabetes mellitus with diabetic polyneuropathy: Secondary | ICD-10-CM | POA: Diagnosis not present

## 2019-09-03 DIAGNOSIS — E1151 Type 2 diabetes mellitus with diabetic peripheral angiopathy without gangrene: Secondary | ICD-10-CM | POA: Diagnosis not present

## 2019-09-03 DIAGNOSIS — M25571 Pain in right ankle and joints of right foot: Secondary | ICD-10-CM | POA: Diagnosis not present

## 2019-09-09 DIAGNOSIS — I2510 Atherosclerotic heart disease of native coronary artery without angina pectoris: Secondary | ICD-10-CM | POA: Diagnosis not present

## 2019-09-09 DIAGNOSIS — R011 Cardiac murmur, unspecified: Secondary | ICD-10-CM | POA: Diagnosis not present

## 2019-09-09 DIAGNOSIS — I10 Essential (primary) hypertension: Secondary | ICD-10-CM | POA: Diagnosis not present

## 2019-09-09 DIAGNOSIS — E119 Type 2 diabetes mellitus without complications: Secondary | ICD-10-CM | POA: Diagnosis not present

## 2019-09-09 DIAGNOSIS — I70213 Atherosclerosis of native arteries of extremities with intermittent claudication, bilateral legs: Secondary | ICD-10-CM | POA: Diagnosis not present

## 2019-09-09 DIAGNOSIS — E785 Hyperlipidemia, unspecified: Secondary | ICD-10-CM | POA: Diagnosis not present

## 2019-09-09 DIAGNOSIS — I739 Peripheral vascular disease, unspecified: Secondary | ICD-10-CM | POA: Diagnosis not present

## 2019-09-10 DIAGNOSIS — I739 Peripheral vascular disease, unspecified: Secondary | ICD-10-CM | POA: Diagnosis not present

## 2019-09-10 DIAGNOSIS — E7800 Pure hypercholesterolemia, unspecified: Secondary | ICD-10-CM | POA: Diagnosis not present

## 2019-09-10 DIAGNOSIS — Z0000 Encounter for general adult medical examination without abnormal findings: Secondary | ICD-10-CM | POA: Diagnosis not present

## 2019-09-13 DIAGNOSIS — R936 Abnormal findings on diagnostic imaging of limbs: Secondary | ICD-10-CM | POA: Diagnosis not present

## 2019-09-13 DIAGNOSIS — M79662 Pain in left lower leg: Secondary | ICD-10-CM | POA: Diagnosis not present

## 2019-09-13 DIAGNOSIS — I70212 Atherosclerosis of native arteries of extremities with intermittent claudication, left leg: Secondary | ICD-10-CM | POA: Diagnosis not present

## 2019-09-13 DIAGNOSIS — I739 Peripheral vascular disease, unspecified: Secondary | ICD-10-CM | POA: Diagnosis not present

## 2019-09-17 DIAGNOSIS — G4733 Obstructive sleep apnea (adult) (pediatric): Secondary | ICD-10-CM | POA: Diagnosis not present

## 2019-09-27 DIAGNOSIS — E782 Mixed hyperlipidemia: Secondary | ICD-10-CM | POA: Diagnosis not present

## 2019-09-27 DIAGNOSIS — E1165 Type 2 diabetes mellitus with hyperglycemia: Secondary | ICD-10-CM | POA: Diagnosis not present

## 2019-10-11 DIAGNOSIS — E1165 Type 2 diabetes mellitus with hyperglycemia: Secondary | ICD-10-CM | POA: Diagnosis not present

## 2019-10-18 DIAGNOSIS — G4733 Obstructive sleep apnea (adult) (pediatric): Secondary | ICD-10-CM | POA: Diagnosis not present

## 2019-10-21 DIAGNOSIS — E785 Hyperlipidemia, unspecified: Secondary | ICD-10-CM | POA: Diagnosis not present

## 2019-10-21 DIAGNOSIS — I10 Essential (primary) hypertension: Secondary | ICD-10-CM | POA: Diagnosis not present

## 2019-10-21 DIAGNOSIS — I2510 Atherosclerotic heart disease of native coronary artery without angina pectoris: Secondary | ICD-10-CM | POA: Diagnosis not present

## 2019-10-21 DIAGNOSIS — E119 Type 2 diabetes mellitus without complications: Secondary | ICD-10-CM | POA: Diagnosis not present

## 2019-10-21 DIAGNOSIS — I739 Peripheral vascular disease, unspecified: Secondary | ICD-10-CM | POA: Diagnosis not present

## 2019-10-21 DIAGNOSIS — R011 Cardiac murmur, unspecified: Secondary | ICD-10-CM | POA: Diagnosis not present

## 2019-11-05 DIAGNOSIS — B351 Tinea unguium: Secondary | ICD-10-CM | POA: Diagnosis not present

## 2019-11-05 DIAGNOSIS — M25572 Pain in left ankle and joints of left foot: Secondary | ICD-10-CM | POA: Diagnosis not present

## 2019-11-05 DIAGNOSIS — M76822 Posterior tibial tendinitis, left leg: Secondary | ICD-10-CM | POA: Diagnosis not present

## 2019-11-05 DIAGNOSIS — E1151 Type 2 diabetes mellitus with diabetic peripheral angiopathy without gangrene: Secondary | ICD-10-CM | POA: Diagnosis not present

## 2019-11-05 DIAGNOSIS — M19072 Primary osteoarthritis, left ankle and foot: Secondary | ICD-10-CM | POA: Diagnosis not present

## 2019-11-08 DIAGNOSIS — R2681 Unsteadiness on feet: Secondary | ICD-10-CM | POA: Diagnosis not present

## 2019-11-08 DIAGNOSIS — R2689 Other abnormalities of gait and mobility: Secondary | ICD-10-CM | POA: Diagnosis not present

## 2019-11-08 DIAGNOSIS — M6281 Muscle weakness (generalized): Secondary | ICD-10-CM | POA: Diagnosis not present

## 2019-11-10 DIAGNOSIS — L905 Scar conditions and fibrosis of skin: Secondary | ICD-10-CM | POA: Diagnosis not present

## 2019-11-11 DIAGNOSIS — R2689 Other abnormalities of gait and mobility: Secondary | ICD-10-CM | POA: Diagnosis not present

## 2019-11-11 DIAGNOSIS — M6281 Muscle weakness (generalized): Secondary | ICD-10-CM | POA: Diagnosis not present

## 2019-11-11 DIAGNOSIS — R2681 Unsteadiness on feet: Secondary | ICD-10-CM | POA: Diagnosis not present

## 2019-11-12 DIAGNOSIS — M25572 Pain in left ankle and joints of left foot: Secondary | ICD-10-CM | POA: Diagnosis not present

## 2019-11-12 DIAGNOSIS — E1140 Type 2 diabetes mellitus with diabetic neuropathy, unspecified: Secondary | ICD-10-CM | POA: Diagnosis not present

## 2019-11-12 DIAGNOSIS — M76822 Posterior tibial tendinitis, left leg: Secondary | ICD-10-CM | POA: Diagnosis not present

## 2019-11-13 DIAGNOSIS — R2681 Unsteadiness on feet: Secondary | ICD-10-CM | POA: Diagnosis not present

## 2019-11-13 DIAGNOSIS — R2689 Other abnormalities of gait and mobility: Secondary | ICD-10-CM | POA: Diagnosis not present

## 2019-11-13 DIAGNOSIS — M6281 Muscle weakness (generalized): Secondary | ICD-10-CM | POA: Diagnosis not present

## 2019-11-15 DIAGNOSIS — R2689 Other abnormalities of gait and mobility: Secondary | ICD-10-CM | POA: Diagnosis not present

## 2019-11-15 DIAGNOSIS — M6281 Muscle weakness (generalized): Secondary | ICD-10-CM | POA: Diagnosis not present

## 2019-11-15 DIAGNOSIS — R2681 Unsteadiness on feet: Secondary | ICD-10-CM | POA: Diagnosis not present

## 2019-11-17 DIAGNOSIS — G4733 Obstructive sleep apnea (adult) (pediatric): Secondary | ICD-10-CM | POA: Diagnosis not present

## 2019-11-19 DIAGNOSIS — R2689 Other abnormalities of gait and mobility: Secondary | ICD-10-CM | POA: Diagnosis not present

## 2019-11-19 DIAGNOSIS — R2681 Unsteadiness on feet: Secondary | ICD-10-CM | POA: Diagnosis not present

## 2019-11-19 DIAGNOSIS — M6281 Muscle weakness (generalized): Secondary | ICD-10-CM | POA: Diagnosis not present

## 2019-11-20 DIAGNOSIS — R2689 Other abnormalities of gait and mobility: Secondary | ICD-10-CM | POA: Diagnosis not present

## 2019-11-20 DIAGNOSIS — M6281 Muscle weakness (generalized): Secondary | ICD-10-CM | POA: Diagnosis not present

## 2019-11-20 DIAGNOSIS — R2681 Unsteadiness on feet: Secondary | ICD-10-CM | POA: Diagnosis not present

## 2019-11-22 DIAGNOSIS — R2689 Other abnormalities of gait and mobility: Secondary | ICD-10-CM | POA: Diagnosis not present

## 2019-11-22 DIAGNOSIS — R2681 Unsteadiness on feet: Secondary | ICD-10-CM | POA: Diagnosis not present

## 2019-11-22 DIAGNOSIS — M6281 Muscle weakness (generalized): Secondary | ICD-10-CM | POA: Diagnosis not present

## 2019-11-25 DIAGNOSIS — R2689 Other abnormalities of gait and mobility: Secondary | ICD-10-CM | POA: Diagnosis not present

## 2019-11-25 DIAGNOSIS — R2681 Unsteadiness on feet: Secondary | ICD-10-CM | POA: Diagnosis not present

## 2019-11-25 DIAGNOSIS — M6281 Muscle weakness (generalized): Secondary | ICD-10-CM | POA: Diagnosis not present

## 2019-11-27 DIAGNOSIS — R2681 Unsteadiness on feet: Secondary | ICD-10-CM | POA: Diagnosis not present

## 2019-11-27 DIAGNOSIS — R2689 Other abnormalities of gait and mobility: Secondary | ICD-10-CM | POA: Diagnosis not present

## 2019-11-27 DIAGNOSIS — M6281 Muscle weakness (generalized): Secondary | ICD-10-CM | POA: Diagnosis not present

## 2019-11-28 DIAGNOSIS — G4733 Obstructive sleep apnea (adult) (pediatric): Secondary | ICD-10-CM | POA: Diagnosis not present

## 2019-11-29 DIAGNOSIS — R2689 Other abnormalities of gait and mobility: Secondary | ICD-10-CM | POA: Diagnosis not present

## 2019-11-29 DIAGNOSIS — M6281 Muscle weakness (generalized): Secondary | ICD-10-CM | POA: Diagnosis not present

## 2019-11-29 DIAGNOSIS — R2681 Unsteadiness on feet: Secondary | ICD-10-CM | POA: Diagnosis not present

## 2019-12-02 DIAGNOSIS — R2689 Other abnormalities of gait and mobility: Secondary | ICD-10-CM | POA: Diagnosis not present

## 2019-12-02 DIAGNOSIS — M6281 Muscle weakness (generalized): Secondary | ICD-10-CM | POA: Diagnosis not present

## 2019-12-02 DIAGNOSIS — R2681 Unsteadiness on feet: Secondary | ICD-10-CM | POA: Diagnosis not present

## 2019-12-04 DIAGNOSIS — R2681 Unsteadiness on feet: Secondary | ICD-10-CM | POA: Diagnosis not present

## 2019-12-04 DIAGNOSIS — M6281 Muscle weakness (generalized): Secondary | ICD-10-CM | POA: Diagnosis not present

## 2019-12-04 DIAGNOSIS — R2689 Other abnormalities of gait and mobility: Secondary | ICD-10-CM | POA: Diagnosis not present

## 2019-12-04 DIAGNOSIS — E782 Mixed hyperlipidemia: Secondary | ICD-10-CM | POA: Diagnosis not present

## 2019-12-06 DIAGNOSIS — M6281 Muscle weakness (generalized): Secondary | ICD-10-CM | POA: Diagnosis not present

## 2019-12-06 DIAGNOSIS — R2689 Other abnormalities of gait and mobility: Secondary | ICD-10-CM | POA: Diagnosis not present

## 2019-12-06 DIAGNOSIS — R2681 Unsteadiness on feet: Secondary | ICD-10-CM | POA: Diagnosis not present

## 2019-12-09 DIAGNOSIS — R2689 Other abnormalities of gait and mobility: Secondary | ICD-10-CM | POA: Diagnosis not present

## 2019-12-09 DIAGNOSIS — I361 Nonrheumatic tricuspid (valve) insufficiency: Secondary | ICD-10-CM | POA: Diagnosis not present

## 2019-12-09 DIAGNOSIS — I70213 Atherosclerosis of native arteries of extremities with intermittent claudication, bilateral legs: Secondary | ICD-10-CM | POA: Diagnosis not present

## 2019-12-09 DIAGNOSIS — I340 Nonrheumatic mitral (valve) insufficiency: Secondary | ICD-10-CM | POA: Diagnosis not present

## 2019-12-09 DIAGNOSIS — R2681 Unsteadiness on feet: Secondary | ICD-10-CM | POA: Diagnosis not present

## 2019-12-09 DIAGNOSIS — I119 Hypertensive heart disease without heart failure: Secondary | ICD-10-CM | POA: Diagnosis not present

## 2019-12-09 DIAGNOSIS — M6281 Muscle weakness (generalized): Secondary | ICD-10-CM | POA: Diagnosis not present

## 2019-12-11 DIAGNOSIS — R2681 Unsteadiness on feet: Secondary | ICD-10-CM | POA: Diagnosis not present

## 2019-12-11 DIAGNOSIS — R2689 Other abnormalities of gait and mobility: Secondary | ICD-10-CM | POA: Diagnosis not present

## 2019-12-11 DIAGNOSIS — M6281 Muscle weakness (generalized): Secondary | ICD-10-CM | POA: Diagnosis not present

## 2019-12-13 DIAGNOSIS — R2681 Unsteadiness on feet: Secondary | ICD-10-CM | POA: Diagnosis not present

## 2019-12-13 DIAGNOSIS — M6281 Muscle weakness (generalized): Secondary | ICD-10-CM | POA: Diagnosis not present

## 2019-12-13 DIAGNOSIS — R2689 Other abnormalities of gait and mobility: Secondary | ICD-10-CM | POA: Diagnosis not present

## 2019-12-16 DIAGNOSIS — M6281 Muscle weakness (generalized): Secondary | ICD-10-CM | POA: Diagnosis not present

## 2019-12-16 DIAGNOSIS — R2681 Unsteadiness on feet: Secondary | ICD-10-CM | POA: Diagnosis not present

## 2019-12-16 DIAGNOSIS — R2689 Other abnormalities of gait and mobility: Secondary | ICD-10-CM | POA: Diagnosis not present

## 2019-12-17 DIAGNOSIS — R011 Cardiac murmur, unspecified: Secondary | ICD-10-CM | POA: Diagnosis not present

## 2019-12-17 DIAGNOSIS — E119 Type 2 diabetes mellitus without complications: Secondary | ICD-10-CM | POA: Diagnosis not present

## 2019-12-17 DIAGNOSIS — I739 Peripheral vascular disease, unspecified: Secondary | ICD-10-CM | POA: Diagnosis not present

## 2019-12-17 DIAGNOSIS — I10 Essential (primary) hypertension: Secondary | ICD-10-CM | POA: Diagnosis not present

## 2019-12-17 DIAGNOSIS — E785 Hyperlipidemia, unspecified: Secondary | ICD-10-CM | POA: Diagnosis not present

## 2019-12-17 DIAGNOSIS — I2510 Atherosclerotic heart disease of native coronary artery without angina pectoris: Secondary | ICD-10-CM | POA: Diagnosis not present

## 2019-12-18 DIAGNOSIS — M6281 Muscle weakness (generalized): Secondary | ICD-10-CM | POA: Diagnosis not present

## 2019-12-18 DIAGNOSIS — R2681 Unsteadiness on feet: Secondary | ICD-10-CM | POA: Diagnosis not present

## 2019-12-18 DIAGNOSIS — R2689 Other abnormalities of gait and mobility: Secondary | ICD-10-CM | POA: Diagnosis not present

## 2019-12-18 DIAGNOSIS — G4733 Obstructive sleep apnea (adult) (pediatric): Secondary | ICD-10-CM | POA: Diagnosis not present

## 2019-12-23 DIAGNOSIS — M6281 Muscle weakness (generalized): Secondary | ICD-10-CM | POA: Diagnosis not present

## 2019-12-23 DIAGNOSIS — R2681 Unsteadiness on feet: Secondary | ICD-10-CM | POA: Diagnosis not present

## 2019-12-23 DIAGNOSIS — R2689 Other abnormalities of gait and mobility: Secondary | ICD-10-CM | POA: Diagnosis not present

## 2019-12-25 DIAGNOSIS — E119 Type 2 diabetes mellitus without complications: Secondary | ICD-10-CM | POA: Diagnosis not present

## 2019-12-25 DIAGNOSIS — R2681 Unsteadiness on feet: Secondary | ICD-10-CM | POA: Diagnosis not present

## 2019-12-25 DIAGNOSIS — M6281 Muscle weakness (generalized): Secondary | ICD-10-CM | POA: Diagnosis not present

## 2019-12-25 DIAGNOSIS — R2689 Other abnormalities of gait and mobility: Secondary | ICD-10-CM | POA: Diagnosis not present

## 2019-12-25 DIAGNOSIS — I10 Essential (primary) hypertension: Secondary | ICD-10-CM | POA: Diagnosis not present

## 2019-12-25 DIAGNOSIS — I779 Disorder of arteries and arterioles, unspecified: Secondary | ICD-10-CM | POA: Diagnosis not present

## 2020-01-01 DIAGNOSIS — R2689 Other abnormalities of gait and mobility: Secondary | ICD-10-CM | POA: Diagnosis not present

## 2020-01-01 DIAGNOSIS — M6281 Muscle weakness (generalized): Secondary | ICD-10-CM | POA: Diagnosis not present

## 2020-01-01 DIAGNOSIS — R2681 Unsteadiness on feet: Secondary | ICD-10-CM | POA: Diagnosis not present

## 2020-01-03 DIAGNOSIS — E669 Obesity, unspecified: Secondary | ICD-10-CM | POA: Diagnosis not present

## 2020-01-03 DIAGNOSIS — E559 Vitamin D deficiency, unspecified: Secondary | ICD-10-CM | POA: Diagnosis not present

## 2020-01-03 DIAGNOSIS — I779 Disorder of arteries and arterioles, unspecified: Secondary | ICD-10-CM | POA: Diagnosis not present

## 2020-01-03 DIAGNOSIS — Z0000 Encounter for general adult medical examination without abnormal findings: Secondary | ICD-10-CM | POA: Diagnosis not present

## 2020-01-03 DIAGNOSIS — N390 Urinary tract infection, site not specified: Secondary | ICD-10-CM | POA: Diagnosis not present

## 2020-01-03 DIAGNOSIS — E785 Hyperlipidemia, unspecified: Secondary | ICD-10-CM | POA: Diagnosis not present

## 2020-01-03 DIAGNOSIS — R2681 Unsteadiness on feet: Secondary | ICD-10-CM | POA: Diagnosis not present

## 2020-01-03 DIAGNOSIS — R739 Hyperglycemia, unspecified: Secondary | ICD-10-CM | POA: Diagnosis not present

## 2020-01-03 DIAGNOSIS — D649 Anemia, unspecified: Secondary | ICD-10-CM | POA: Diagnosis not present

## 2020-01-03 DIAGNOSIS — R5383 Other fatigue: Secondary | ICD-10-CM | POA: Diagnosis not present

## 2020-01-03 DIAGNOSIS — R2689 Other abnormalities of gait and mobility: Secondary | ICD-10-CM | POA: Diagnosis not present

## 2020-01-03 DIAGNOSIS — I10 Essential (primary) hypertension: Secondary | ICD-10-CM | POA: Diagnosis not present

## 2020-01-03 DIAGNOSIS — M6281 Muscle weakness (generalized): Secondary | ICD-10-CM | POA: Diagnosis not present

## 2020-01-03 DIAGNOSIS — R7301 Impaired fasting glucose: Secondary | ICD-10-CM | POA: Diagnosis not present

## 2020-01-03 DIAGNOSIS — E119 Type 2 diabetes mellitus without complications: Secondary | ICD-10-CM | POA: Diagnosis not present

## 2020-01-03 DIAGNOSIS — E782 Mixed hyperlipidemia: Secondary | ICD-10-CM | POA: Diagnosis not present

## 2020-01-06 DIAGNOSIS — R2681 Unsteadiness on feet: Secondary | ICD-10-CM | POA: Diagnosis not present

## 2020-01-06 DIAGNOSIS — R2689 Other abnormalities of gait and mobility: Secondary | ICD-10-CM | POA: Diagnosis not present

## 2020-01-06 DIAGNOSIS — M6281 Muscle weakness (generalized): Secondary | ICD-10-CM | POA: Diagnosis not present

## 2020-01-08 DIAGNOSIS — M6281 Muscle weakness (generalized): Secondary | ICD-10-CM | POA: Diagnosis not present

## 2020-01-08 DIAGNOSIS — R2681 Unsteadiness on feet: Secondary | ICD-10-CM | POA: Diagnosis not present

## 2020-01-08 DIAGNOSIS — R2689 Other abnormalities of gait and mobility: Secondary | ICD-10-CM | POA: Diagnosis not present

## 2020-01-13 DIAGNOSIS — R2681 Unsteadiness on feet: Secondary | ICD-10-CM | POA: Diagnosis not present

## 2020-01-13 DIAGNOSIS — M6281 Muscle weakness (generalized): Secondary | ICD-10-CM | POA: Diagnosis not present

## 2020-01-13 DIAGNOSIS — R2689 Other abnormalities of gait and mobility: Secondary | ICD-10-CM | POA: Diagnosis not present

## 2020-01-15 DIAGNOSIS — R2681 Unsteadiness on feet: Secondary | ICD-10-CM | POA: Diagnosis not present

## 2020-01-15 DIAGNOSIS — R2689 Other abnormalities of gait and mobility: Secondary | ICD-10-CM | POA: Diagnosis not present

## 2020-01-15 DIAGNOSIS — M6281 Muscle weakness (generalized): Secondary | ICD-10-CM | POA: Diagnosis not present

## 2020-01-16 DIAGNOSIS — I779 Disorder of arteries and arterioles, unspecified: Secondary | ICD-10-CM | POA: Diagnosis not present

## 2020-01-16 DIAGNOSIS — M169 Osteoarthritis of hip, unspecified: Secondary | ICD-10-CM | POA: Diagnosis not present

## 2020-01-16 DIAGNOSIS — E119 Type 2 diabetes mellitus without complications: Secondary | ICD-10-CM | POA: Diagnosis not present

## 2020-01-16 DIAGNOSIS — I82409 Acute embolism and thrombosis of unspecified deep veins of unspecified lower extremity: Secondary | ICD-10-CM | POA: Diagnosis not present

## 2020-01-17 DIAGNOSIS — G4733 Obstructive sleep apnea (adult) (pediatric): Secondary | ICD-10-CM | POA: Diagnosis not present

## 2020-01-20 DIAGNOSIS — M6281 Muscle weakness (generalized): Secondary | ICD-10-CM | POA: Diagnosis not present

## 2020-01-20 DIAGNOSIS — R2689 Other abnormalities of gait and mobility: Secondary | ICD-10-CM | POA: Diagnosis not present

## 2020-01-20 DIAGNOSIS — R2681 Unsteadiness on feet: Secondary | ICD-10-CM | POA: Diagnosis not present

## 2020-01-27 DIAGNOSIS — H40013 Open angle with borderline findings, low risk, bilateral: Secondary | ICD-10-CM | POA: Diagnosis not present

## 2020-01-27 DIAGNOSIS — H35033 Hypertensive retinopathy, bilateral: Secondary | ICD-10-CM | POA: Diagnosis not present

## 2020-01-27 DIAGNOSIS — E113293 Type 2 diabetes mellitus with mild nonproliferative diabetic retinopathy without macular edema, bilateral: Secondary | ICD-10-CM | POA: Diagnosis not present

## 2020-01-27 DIAGNOSIS — Z794 Long term (current) use of insulin: Secondary | ICD-10-CM | POA: Diagnosis not present

## 2020-01-27 DIAGNOSIS — H43813 Vitreous degeneration, bilateral: Secondary | ICD-10-CM | POA: Diagnosis not present

## 2020-01-29 DIAGNOSIS — M79606 Pain in leg, unspecified: Secondary | ICD-10-CM | POA: Diagnosis not present

## 2020-01-29 DIAGNOSIS — I70219 Atherosclerosis of native arteries of extremities with intermittent claudication, unspecified extremity: Secondary | ICD-10-CM | POA: Diagnosis not present

## 2020-01-29 DIAGNOSIS — R2689 Other abnormalities of gait and mobility: Secondary | ICD-10-CM | POA: Diagnosis not present

## 2020-01-29 DIAGNOSIS — Z136 Encounter for screening for cardiovascular disorders: Secondary | ICD-10-CM | POA: Diagnosis not present

## 2020-01-29 DIAGNOSIS — R2681 Unsteadiness on feet: Secondary | ICD-10-CM | POA: Diagnosis not present

## 2020-01-29 DIAGNOSIS — M6281 Muscle weakness (generalized): Secondary | ICD-10-CM | POA: Diagnosis not present

## 2020-01-29 DIAGNOSIS — R9430 Abnormal result of cardiovascular function study, unspecified: Secondary | ICD-10-CM | POA: Diagnosis not present

## 2020-01-31 DIAGNOSIS — E1165 Type 2 diabetes mellitus with hyperglycemia: Secondary | ICD-10-CM | POA: Diagnosis not present

## 2020-01-31 DIAGNOSIS — E782 Mixed hyperlipidemia: Secondary | ICD-10-CM | POA: Diagnosis not present

## 2020-02-11 DIAGNOSIS — I2510 Atherosclerotic heart disease of native coronary artery without angina pectoris: Secondary | ICD-10-CM | POA: Diagnosis not present

## 2020-02-11 DIAGNOSIS — I739 Peripheral vascular disease, unspecified: Secondary | ICD-10-CM | POA: Diagnosis not present

## 2020-02-11 DIAGNOSIS — I10 Essential (primary) hypertension: Secondary | ICD-10-CM | POA: Diagnosis not present

## 2020-02-11 DIAGNOSIS — E119 Type 2 diabetes mellitus without complications: Secondary | ICD-10-CM | POA: Diagnosis not present

## 2020-02-11 DIAGNOSIS — E785 Hyperlipidemia, unspecified: Secondary | ICD-10-CM | POA: Diagnosis not present

## 2020-02-11 DIAGNOSIS — R011 Cardiac murmur, unspecified: Secondary | ICD-10-CM | POA: Diagnosis not present

## 2020-02-17 DIAGNOSIS — G4733 Obstructive sleep apnea (adult) (pediatric): Secondary | ICD-10-CM | POA: Diagnosis not present

## 2020-02-26 DIAGNOSIS — E1165 Type 2 diabetes mellitus with hyperglycemia: Secondary | ICD-10-CM | POA: Diagnosis not present

## 2020-02-26 DIAGNOSIS — Z9862 Peripheral vascular angioplasty status: Secondary | ICD-10-CM | POA: Diagnosis not present

## 2020-02-26 DIAGNOSIS — Z23 Encounter for immunization: Secondary | ICD-10-CM | POA: Diagnosis not present

## 2020-02-26 DIAGNOSIS — M5416 Radiculopathy, lumbar region: Secondary | ICD-10-CM | POA: Diagnosis not present

## 2020-02-26 DIAGNOSIS — E782 Mixed hyperlipidemia: Secondary | ICD-10-CM | POA: Diagnosis not present

## 2020-02-26 DIAGNOSIS — D5912 Cold autoimmune hemolytic anemia: Secondary | ICD-10-CM | POA: Diagnosis not present

## 2020-02-26 DIAGNOSIS — Z1211 Encounter for screening for malignant neoplasm of colon: Secondary | ICD-10-CM | POA: Diagnosis not present

## 2020-02-26 DIAGNOSIS — I10 Essential (primary) hypertension: Secondary | ICD-10-CM | POA: Diagnosis not present

## 2020-02-26 DIAGNOSIS — Z136 Encounter for screening for cardiovascular disorders: Secondary | ICD-10-CM | POA: Diagnosis not present

## 2020-03-12 DIAGNOSIS — G4733 Obstructive sleep apnea (adult) (pediatric): Secondary | ICD-10-CM | POA: Diagnosis not present

## 2020-03-13 DIAGNOSIS — Z122 Encounter for screening for malignant neoplasm of respiratory organs: Secondary | ICD-10-CM | POA: Diagnosis not present

## 2020-03-13 DIAGNOSIS — I2510 Atherosclerotic heart disease of native coronary artery without angina pectoris: Secondary | ICD-10-CM | POA: Diagnosis not present

## 2020-03-13 DIAGNOSIS — R918 Other nonspecific abnormal finding of lung field: Secondary | ICD-10-CM | POA: Diagnosis not present

## 2020-03-13 DIAGNOSIS — J479 Bronchiectasis, uncomplicated: Secondary | ICD-10-CM | POA: Diagnosis not present

## 2020-03-13 DIAGNOSIS — J984 Other disorders of lung: Secondary | ICD-10-CM | POA: Diagnosis not present

## 2020-03-13 DIAGNOSIS — Z87891 Personal history of nicotine dependence: Secondary | ICD-10-CM | POA: Diagnosis not present

## 2020-03-13 DIAGNOSIS — Z1231 Encounter for screening mammogram for malignant neoplasm of breast: Secondary | ICD-10-CM | POA: Diagnosis not present

## 2020-03-13 DIAGNOSIS — Z136 Encounter for screening for cardiovascular disorders: Secondary | ICD-10-CM | POA: Diagnosis not present

## 2020-03-13 DIAGNOSIS — I700 Atherosclerosis of aorta: Secondary | ICD-10-CM | POA: Diagnosis not present

## 2020-03-19 DIAGNOSIS — G4733 Obstructive sleep apnea (adult) (pediatric): Secondary | ICD-10-CM | POA: Diagnosis not present

## 2020-03-20 DIAGNOSIS — I2510 Atherosclerotic heart disease of native coronary artery without angina pectoris: Secondary | ICD-10-CM | POA: Diagnosis not present

## 2020-03-20 DIAGNOSIS — B351 Tinea unguium: Secondary | ICD-10-CM | POA: Diagnosis not present

## 2020-03-20 DIAGNOSIS — E119 Type 2 diabetes mellitus without complications: Secondary | ICD-10-CM | POA: Diagnosis not present

## 2020-03-20 DIAGNOSIS — I119 Hypertensive heart disease without heart failure: Secondary | ICD-10-CM | POA: Diagnosis not present

## 2020-03-20 DIAGNOSIS — E1151 Type 2 diabetes mellitus with diabetic peripheral angiopathy without gangrene: Secondary | ICD-10-CM | POA: Diagnosis not present

## 2020-03-20 DIAGNOSIS — I739 Peripheral vascular disease, unspecified: Secondary | ICD-10-CM | POA: Diagnosis not present

## 2020-03-20 DIAGNOSIS — E782 Mixed hyperlipidemia: Secondary | ICD-10-CM | POA: Diagnosis not present

## 2020-03-24 DIAGNOSIS — E1165 Type 2 diabetes mellitus with hyperglycemia: Secondary | ICD-10-CM | POA: Diagnosis not present

## 2020-03-24 DIAGNOSIS — I10 Essential (primary) hypertension: Secondary | ICD-10-CM | POA: Diagnosis not present

## 2020-03-24 DIAGNOSIS — E782 Mixed hyperlipidemia: Secondary | ICD-10-CM | POA: Diagnosis not present

## 2020-03-24 DIAGNOSIS — Z1211 Encounter for screening for malignant neoplasm of colon: Secondary | ICD-10-CM | POA: Diagnosis not present

## 2020-03-24 DIAGNOSIS — Z6830 Body mass index (BMI) 30.0-30.9, adult: Secondary | ICD-10-CM | POA: Diagnosis not present

## 2020-03-24 DIAGNOSIS — Z23 Encounter for immunization: Secondary | ICD-10-CM | POA: Diagnosis not present

## 2020-03-27 DIAGNOSIS — Z87891 Personal history of nicotine dependence: Secondary | ICD-10-CM | POA: Diagnosis not present

## 2020-03-27 DIAGNOSIS — L97521 Non-pressure chronic ulcer of other part of left foot limited to breakdown of skin: Secondary | ICD-10-CM | POA: Diagnosis not present

## 2020-03-27 DIAGNOSIS — I10 Essential (primary) hypertension: Secondary | ICD-10-CM | POA: Diagnosis not present

## 2020-03-27 DIAGNOSIS — I70245 Atherosclerosis of native arteries of left leg with ulceration of other part of foot: Secondary | ICD-10-CM | POA: Diagnosis not present

## 2020-03-27 DIAGNOSIS — E1159 Type 2 diabetes mellitus with other circulatory complications: Secondary | ICD-10-CM | POA: Diagnosis not present

## 2020-03-27 DIAGNOSIS — I70213 Atherosclerosis of native arteries of extremities with intermittent claudication, bilateral legs: Secondary | ICD-10-CM | POA: Diagnosis not present

## 2020-03-27 DIAGNOSIS — M79662 Pain in left lower leg: Secondary | ICD-10-CM | POA: Diagnosis not present

## 2020-03-27 DIAGNOSIS — M79661 Pain in right lower leg: Secondary | ICD-10-CM | POA: Diagnosis not present

## 2020-03-27 DIAGNOSIS — Z95828 Presence of other vascular implants and grafts: Secondary | ICD-10-CM | POA: Diagnosis not present

## 2020-03-28 DIAGNOSIS — E1151 Type 2 diabetes mellitus with diabetic peripheral angiopathy without gangrene: Secondary | ICD-10-CM | POA: Diagnosis not present

## 2020-03-28 DIAGNOSIS — G4733 Obstructive sleep apnea (adult) (pediatric): Secondary | ICD-10-CM | POA: Diagnosis not present

## 2020-03-28 DIAGNOSIS — I2510 Atherosclerotic heart disease of native coronary artery without angina pectoris: Secondary | ICD-10-CM | POA: Diagnosis not present

## 2020-03-28 DIAGNOSIS — E669 Obesity, unspecified: Secondary | ICD-10-CM | POA: Diagnosis not present

## 2020-03-28 DIAGNOSIS — Z794 Long term (current) use of insulin: Secondary | ICD-10-CM | POA: Diagnosis not present

## 2020-03-28 DIAGNOSIS — Z87891 Personal history of nicotine dependence: Secondary | ICD-10-CM | POA: Diagnosis not present

## 2020-03-28 DIAGNOSIS — Z8249 Family history of ischemic heart disease and other diseases of the circulatory system: Secondary | ICD-10-CM | POA: Diagnosis not present

## 2020-03-28 DIAGNOSIS — E1165 Type 2 diabetes mellitus with hyperglycemia: Secondary | ICD-10-CM | POA: Diagnosis not present

## 2020-03-28 DIAGNOSIS — Z95820 Peripheral vascular angioplasty status with implants and grafts: Secondary | ICD-10-CM | POA: Diagnosis not present

## 2020-03-28 DIAGNOSIS — M1990 Unspecified osteoarthritis, unspecified site: Secondary | ICD-10-CM | POA: Diagnosis not present

## 2020-03-28 DIAGNOSIS — E785 Hyperlipidemia, unspecified: Secondary | ICD-10-CM | POA: Diagnosis not present

## 2020-03-28 DIAGNOSIS — I10 Essential (primary) hypertension: Secondary | ICD-10-CM | POA: Diagnosis not present

## 2020-04-03 DIAGNOSIS — Z95828 Presence of other vascular implants and grafts: Secondary | ICD-10-CM | POA: Diagnosis not present

## 2020-04-03 DIAGNOSIS — I739 Peripheral vascular disease, unspecified: Secondary | ICD-10-CM | POA: Diagnosis not present

## 2020-04-03 DIAGNOSIS — Z9049 Acquired absence of other specified parts of digestive tract: Secondary | ICD-10-CM | POA: Diagnosis not present

## 2020-04-03 DIAGNOSIS — Z6832 Body mass index (BMI) 32.0-32.9, adult: Secondary | ICD-10-CM | POA: Diagnosis not present

## 2020-04-03 DIAGNOSIS — Z7901 Long term (current) use of anticoagulants: Secondary | ICD-10-CM | POA: Diagnosis not present

## 2020-04-03 DIAGNOSIS — K219 Gastro-esophageal reflux disease without esophagitis: Secondary | ICD-10-CM | POA: Diagnosis not present

## 2020-04-06 DIAGNOSIS — Z1231 Encounter for screening mammogram for malignant neoplasm of breast: Secondary | ICD-10-CM | POA: Diagnosis not present

## 2020-04-06 DIAGNOSIS — Z803 Family history of malignant neoplasm of breast: Secondary | ICD-10-CM | POA: Diagnosis not present

## 2020-04-18 DIAGNOSIS — G4733 Obstructive sleep apnea (adult) (pediatric): Secondary | ICD-10-CM | POA: Diagnosis not present

## 2020-04-20 DIAGNOSIS — I739 Peripheral vascular disease, unspecified: Secondary | ICD-10-CM | POA: Diagnosis not present

## 2020-04-20 DIAGNOSIS — K8020 Calculus of gallbladder without cholecystitis without obstruction: Secondary | ICD-10-CM | POA: Diagnosis not present

## 2020-04-20 DIAGNOSIS — I70213 Atherosclerosis of native arteries of extremities with intermittent claudication, bilateral legs: Secondary | ICD-10-CM | POA: Diagnosis not present

## 2020-04-20 DIAGNOSIS — I700 Atherosclerosis of aorta: Secondary | ICD-10-CM | POA: Diagnosis not present

## 2020-04-30 DIAGNOSIS — Z95828 Presence of other vascular implants and grafts: Secondary | ICD-10-CM | POA: Diagnosis not present

## 2020-04-30 DIAGNOSIS — I70245 Atherosclerosis of native arteries of left leg with ulceration of other part of foot: Secondary | ICD-10-CM | POA: Diagnosis not present

## 2020-04-30 DIAGNOSIS — Z87891 Personal history of nicotine dependence: Secondary | ICD-10-CM | POA: Diagnosis not present

## 2020-04-30 DIAGNOSIS — I743 Embolism and thrombosis of arteries of the lower extremities: Secondary | ICD-10-CM | POA: Diagnosis not present

## 2020-04-30 DIAGNOSIS — I70213 Atherosclerosis of native arteries of extremities with intermittent claudication, bilateral legs: Secondary | ICD-10-CM | POA: Diagnosis not present

## 2020-04-30 DIAGNOSIS — E1151 Type 2 diabetes mellitus with diabetic peripheral angiopathy without gangrene: Secondary | ICD-10-CM | POA: Diagnosis not present

## 2020-05-01 DIAGNOSIS — K2970 Gastritis, unspecified, without bleeding: Secondary | ICD-10-CM | POA: Diagnosis not present

## 2020-05-01 DIAGNOSIS — K5730 Diverticulosis of large intestine without perforation or abscess without bleeding: Secondary | ICD-10-CM | POA: Diagnosis not present

## 2020-05-01 DIAGNOSIS — K2900 Acute gastritis without bleeding: Secondary | ICD-10-CM | POA: Diagnosis not present

## 2020-05-01 DIAGNOSIS — Z86010 Personal history of colonic polyps: Secondary | ICD-10-CM | POA: Diagnosis not present

## 2020-05-01 DIAGNOSIS — D124 Benign neoplasm of descending colon: Secondary | ICD-10-CM | POA: Diagnosis not present

## 2020-05-01 DIAGNOSIS — K2980 Duodenitis without bleeding: Secondary | ICD-10-CM | POA: Diagnosis not present

## 2020-05-01 DIAGNOSIS — K640 First degree hemorrhoids: Secondary | ICD-10-CM | POA: Diagnosis not present

## 2020-05-01 HISTORY — PX: COLONOSCOPY: SHX174

## 2020-05-01 HISTORY — PX: ESOPHAGOGASTRODUODENOSCOPY: SHX1529

## 2020-05-11 DIAGNOSIS — I340 Nonrheumatic mitral (valve) insufficiency: Secondary | ICD-10-CM | POA: Diagnosis not present

## 2020-05-27 DIAGNOSIS — I119 Hypertensive heart disease without heart failure: Secondary | ICD-10-CM | POA: Diagnosis not present

## 2020-05-27 DIAGNOSIS — E119 Type 2 diabetes mellitus without complications: Secondary | ICD-10-CM | POA: Diagnosis not present

## 2020-05-27 DIAGNOSIS — I2510 Atherosclerotic heart disease of native coronary artery without angina pectoris: Secondary | ICD-10-CM | POA: Diagnosis not present

## 2020-05-27 DIAGNOSIS — E782 Mixed hyperlipidemia: Secondary | ICD-10-CM | POA: Diagnosis not present

## 2020-05-27 DIAGNOSIS — E669 Obesity, unspecified: Secondary | ICD-10-CM | POA: Diagnosis not present

## 2020-05-27 DIAGNOSIS — I739 Peripheral vascular disease, unspecified: Secondary | ICD-10-CM | POA: Diagnosis not present

## 2020-05-27 DIAGNOSIS — G4730 Sleep apnea, unspecified: Secondary | ICD-10-CM | POA: Diagnosis not present

## 2020-06-05 DIAGNOSIS — M542 Cervicalgia: Secondary | ICD-10-CM | POA: Diagnosis not present

## 2020-06-05 DIAGNOSIS — I10 Essential (primary) hypertension: Secondary | ICD-10-CM | POA: Diagnosis not present

## 2020-06-05 DIAGNOSIS — E1121 Type 2 diabetes mellitus with diabetic nephropathy: Secondary | ICD-10-CM | POA: Diagnosis not present

## 2020-06-05 DIAGNOSIS — M545 Low back pain, unspecified: Secondary | ICD-10-CM | POA: Diagnosis not present

## 2020-06-05 DIAGNOSIS — E782 Mixed hyperlipidemia: Secondary | ICD-10-CM | POA: Diagnosis not present

## 2020-06-05 DIAGNOSIS — Z6829 Body mass index (BMI) 29.0-29.9, adult: Secondary | ICD-10-CM | POA: Diagnosis not present

## 2020-06-09 DIAGNOSIS — R0602 Shortness of breath: Secondary | ICD-10-CM | POA: Diagnosis not present

## 2020-06-09 DIAGNOSIS — I209 Angina pectoris, unspecified: Secondary | ICD-10-CM | POA: Diagnosis not present

## 2020-06-09 DIAGNOSIS — I119 Hypertensive heart disease without heart failure: Secondary | ICD-10-CM | POA: Diagnosis not present

## 2020-06-29 DIAGNOSIS — M2012 Hallux valgus (acquired), left foot: Secondary | ICD-10-CM | POA: Diagnosis not present

## 2020-06-29 DIAGNOSIS — M2011 Hallux valgus (acquired), right foot: Secondary | ICD-10-CM | POA: Diagnosis not present

## 2020-06-29 DIAGNOSIS — B351 Tinea unguium: Secondary | ICD-10-CM | POA: Diagnosis not present

## 2020-06-29 DIAGNOSIS — M2042 Other hammer toe(s) (acquired), left foot: Secondary | ICD-10-CM | POA: Diagnosis not present

## 2020-06-29 DIAGNOSIS — E1149 Type 2 diabetes mellitus with other diabetic neurological complication: Secondary | ICD-10-CM | POA: Diagnosis not present

## 2020-06-29 DIAGNOSIS — M2041 Other hammer toe(s) (acquired), right foot: Secondary | ICD-10-CM | POA: Diagnosis not present

## 2020-07-15 DIAGNOSIS — M542 Cervicalgia: Secondary | ICD-10-CM | POA: Diagnosis not present

## 2020-07-15 DIAGNOSIS — M791 Myalgia, unspecified site: Secondary | ICD-10-CM | POA: Diagnosis not present

## 2020-07-17 DIAGNOSIS — M542 Cervicalgia: Secondary | ICD-10-CM | POA: Diagnosis not present

## 2020-07-17 DIAGNOSIS — M791 Myalgia, unspecified site: Secondary | ICD-10-CM | POA: Diagnosis not present

## 2020-07-20 DIAGNOSIS — M542 Cervicalgia: Secondary | ICD-10-CM | POA: Diagnosis not present

## 2020-07-20 DIAGNOSIS — M791 Myalgia, unspecified site: Secondary | ICD-10-CM | POA: Diagnosis not present

## 2020-07-22 DIAGNOSIS — M791 Myalgia, unspecified site: Secondary | ICD-10-CM | POA: Diagnosis not present

## 2020-07-22 DIAGNOSIS — M542 Cervicalgia: Secondary | ICD-10-CM | POA: Diagnosis not present

## 2020-07-27 DIAGNOSIS — I7389 Other specified peripheral vascular diseases: Secondary | ICD-10-CM | POA: Diagnosis not present

## 2020-07-27 DIAGNOSIS — I10 Essential (primary) hypertension: Secondary | ICD-10-CM | POA: Diagnosis not present

## 2020-07-27 DIAGNOSIS — Z872 Personal history of diseases of the skin and subcutaneous tissue: Secondary | ICD-10-CM | POA: Diagnosis not present

## 2020-07-27 DIAGNOSIS — Z95828 Presence of other vascular implants and grafts: Secondary | ICD-10-CM | POA: Diagnosis not present

## 2020-07-27 DIAGNOSIS — R0989 Other specified symptoms and signs involving the circulatory and respiratory systems: Secondary | ICD-10-CM | POA: Diagnosis not present

## 2020-07-27 DIAGNOSIS — E668 Other obesity: Secondary | ICD-10-CM | POA: Diagnosis not present

## 2020-07-27 DIAGNOSIS — M791 Myalgia, unspecified site: Secondary | ICD-10-CM | POA: Diagnosis not present

## 2020-07-27 DIAGNOSIS — M542 Cervicalgia: Secondary | ICD-10-CM | POA: Diagnosis not present

## 2020-07-27 DIAGNOSIS — Z6831 Body mass index (BMI) 31.0-31.9, adult: Secondary | ICD-10-CM | POA: Diagnosis not present

## 2020-07-27 DIAGNOSIS — Z87891 Personal history of nicotine dependence: Secondary | ICD-10-CM | POA: Diagnosis not present

## 2020-07-29 DIAGNOSIS — E1121 Type 2 diabetes mellitus with diabetic nephropathy: Secondary | ICD-10-CM | POA: Diagnosis not present

## 2020-07-29 DIAGNOSIS — M542 Cervicalgia: Secondary | ICD-10-CM | POA: Diagnosis not present

## 2020-07-29 DIAGNOSIS — M791 Myalgia, unspecified site: Secondary | ICD-10-CM | POA: Diagnosis not present

## 2020-08-03 DIAGNOSIS — M542 Cervicalgia: Secondary | ICD-10-CM | POA: Diagnosis not present

## 2020-08-03 DIAGNOSIS — M791 Myalgia, unspecified site: Secondary | ICD-10-CM | POA: Diagnosis not present

## 2020-08-05 DIAGNOSIS — M542 Cervicalgia: Secondary | ICD-10-CM | POA: Diagnosis not present

## 2020-08-05 DIAGNOSIS — M791 Myalgia, unspecified site: Secondary | ICD-10-CM | POA: Diagnosis not present

## 2020-08-10 DIAGNOSIS — M542 Cervicalgia: Secondary | ICD-10-CM | POA: Diagnosis not present

## 2020-08-10 DIAGNOSIS — M791 Myalgia, unspecified site: Secondary | ICD-10-CM | POA: Diagnosis not present

## 2020-08-12 DIAGNOSIS — M791 Myalgia, unspecified site: Secondary | ICD-10-CM | POA: Diagnosis not present

## 2020-08-12 DIAGNOSIS — M542 Cervicalgia: Secondary | ICD-10-CM | POA: Diagnosis not present

## 2020-08-17 DIAGNOSIS — M791 Myalgia, unspecified site: Secondary | ICD-10-CM | POA: Diagnosis not present

## 2020-08-17 DIAGNOSIS — M542 Cervicalgia: Secondary | ICD-10-CM | POA: Diagnosis not present

## 2020-08-19 DIAGNOSIS — M542 Cervicalgia: Secondary | ICD-10-CM | POA: Diagnosis not present

## 2020-08-19 DIAGNOSIS — M791 Myalgia, unspecified site: Secondary | ICD-10-CM | POA: Diagnosis not present

## 2020-08-24 DIAGNOSIS — M791 Myalgia, unspecified site: Secondary | ICD-10-CM | POA: Diagnosis not present

## 2020-08-24 DIAGNOSIS — M542 Cervicalgia: Secondary | ICD-10-CM | POA: Diagnosis not present

## 2020-08-26 DIAGNOSIS — M21371 Foot drop, right foot: Secondary | ICD-10-CM | POA: Diagnosis not present

## 2020-08-26 DIAGNOSIS — M7061 Trochanteric bursitis, right hip: Secondary | ICD-10-CM | POA: Diagnosis not present

## 2020-08-26 DIAGNOSIS — M7062 Trochanteric bursitis, left hip: Secondary | ICD-10-CM | POA: Diagnosis not present

## 2020-08-26 DIAGNOSIS — M791 Myalgia, unspecified site: Secondary | ICD-10-CM | POA: Diagnosis not present

## 2020-08-26 DIAGNOSIS — M542 Cervicalgia: Secondary | ICD-10-CM | POA: Diagnosis not present

## 2020-08-26 DIAGNOSIS — Z96641 Presence of right artificial hip joint: Secondary | ICD-10-CM | POA: Diagnosis not present

## 2020-08-26 DIAGNOSIS — M48061 Spinal stenosis, lumbar region without neurogenic claudication: Secondary | ICD-10-CM | POA: Diagnosis not present

## 2020-08-26 DIAGNOSIS — M25551 Pain in right hip: Secondary | ICD-10-CM | POA: Diagnosis not present

## 2020-08-31 DIAGNOSIS — M542 Cervicalgia: Secondary | ICD-10-CM | POA: Diagnosis not present

## 2020-08-31 DIAGNOSIS — M791 Myalgia, unspecified site: Secondary | ICD-10-CM | POA: Diagnosis not present

## 2020-09-01 DIAGNOSIS — M545 Low back pain, unspecified: Secondary | ICD-10-CM | POA: Diagnosis not present

## 2020-09-01 DIAGNOSIS — M47896 Other spondylosis, lumbar region: Secondary | ICD-10-CM | POA: Diagnosis not present

## 2020-09-01 DIAGNOSIS — M25512 Pain in left shoulder: Secondary | ICD-10-CM | POA: Diagnosis not present

## 2020-09-01 DIAGNOSIS — M13812 Other specified arthritis, left shoulder: Secondary | ICD-10-CM | POA: Diagnosis not present

## 2020-09-01 DIAGNOSIS — M542 Cervicalgia: Secondary | ICD-10-CM | POA: Diagnosis not present

## 2020-09-01 DIAGNOSIS — M47812 Spondylosis without myelopathy or radiculopathy, cervical region: Secondary | ICD-10-CM | POA: Diagnosis not present

## 2020-09-01 DIAGNOSIS — M5136 Other intervertebral disc degeneration, lumbar region: Secondary | ICD-10-CM | POA: Diagnosis not present

## 2020-09-01 DIAGNOSIS — Z981 Arthrodesis status: Secondary | ICD-10-CM | POA: Diagnosis not present

## 2020-09-01 DIAGNOSIS — M5030 Other cervical disc degeneration, unspecified cervical region: Secondary | ICD-10-CM | POA: Diagnosis not present

## 2020-09-02 DIAGNOSIS — M791 Myalgia, unspecified site: Secondary | ICD-10-CM | POA: Diagnosis not present

## 2020-09-02 DIAGNOSIS — M542 Cervicalgia: Secondary | ICD-10-CM | POA: Diagnosis not present

## 2020-09-03 DIAGNOSIS — E1342 Other specified diabetes mellitus with diabetic polyneuropathy: Secondary | ICD-10-CM | POA: Diagnosis not present

## 2020-09-03 DIAGNOSIS — M7552 Bursitis of left shoulder: Secondary | ICD-10-CM | POA: Diagnosis not present

## 2020-09-03 DIAGNOSIS — M545 Low back pain, unspecified: Secondary | ICD-10-CM | POA: Diagnosis not present

## 2020-09-03 DIAGNOSIS — M21371 Foot drop, right foot: Secondary | ICD-10-CM | POA: Diagnosis not present

## 2020-09-03 DIAGNOSIS — M1711 Unilateral primary osteoarthritis, right knee: Secondary | ICD-10-CM | POA: Diagnosis not present

## 2020-09-03 DIAGNOSIS — M4712 Other spondylosis with myelopathy, cervical region: Secondary | ICD-10-CM | POA: Diagnosis not present

## 2020-09-07 DIAGNOSIS — Z981 Arthrodesis status: Secondary | ICD-10-CM | POA: Insufficient documentation

## 2020-09-08 DIAGNOSIS — M7552 Bursitis of left shoulder: Secondary | ICD-10-CM | POA: Diagnosis not present

## 2020-09-08 DIAGNOSIS — M21371 Foot drop, right foot: Secondary | ICD-10-CM | POA: Diagnosis not present

## 2020-09-08 DIAGNOSIS — E1342 Other specified diabetes mellitus with diabetic polyneuropathy: Secondary | ICD-10-CM | POA: Diagnosis not present

## 2020-09-08 DIAGNOSIS — M1711 Unilateral primary osteoarthritis, right knee: Secondary | ICD-10-CM | POA: Diagnosis not present

## 2020-09-08 DIAGNOSIS — M4712 Other spondylosis with myelopathy, cervical region: Secondary | ICD-10-CM | POA: Diagnosis not present

## 2020-09-08 DIAGNOSIS — M545 Low back pain, unspecified: Secondary | ICD-10-CM | POA: Diagnosis not present

## 2020-09-09 DIAGNOSIS — E1121 Type 2 diabetes mellitus with diabetic nephropathy: Secondary | ICD-10-CM | POA: Diagnosis not present

## 2020-09-09 DIAGNOSIS — N183 Chronic kidney disease, stage 3 unspecified: Secondary | ICD-10-CM | POA: Diagnosis not present

## 2020-09-09 DIAGNOSIS — E782 Mixed hyperlipidemia: Secondary | ICD-10-CM | POA: Diagnosis not present

## 2020-09-09 DIAGNOSIS — I10 Essential (primary) hypertension: Secondary | ICD-10-CM | POA: Diagnosis not present

## 2020-09-11 DIAGNOSIS — M545 Low back pain, unspecified: Secondary | ICD-10-CM | POA: Diagnosis not present

## 2020-09-11 DIAGNOSIS — M4712 Other spondylosis with myelopathy, cervical region: Secondary | ICD-10-CM | POA: Diagnosis not present

## 2020-09-11 DIAGNOSIS — M1711 Unilateral primary osteoarthritis, right knee: Secondary | ICD-10-CM | POA: Diagnosis not present

## 2020-09-11 DIAGNOSIS — M7552 Bursitis of left shoulder: Secondary | ICD-10-CM | POA: Diagnosis not present

## 2020-09-11 DIAGNOSIS — E1342 Other specified diabetes mellitus with diabetic polyneuropathy: Secondary | ICD-10-CM | POA: Diagnosis not present

## 2020-09-11 DIAGNOSIS — M21371 Foot drop, right foot: Secondary | ICD-10-CM | POA: Diagnosis not present

## 2020-09-15 DIAGNOSIS — M1711 Unilateral primary osteoarthritis, right knee: Secondary | ICD-10-CM | POA: Diagnosis not present

## 2020-09-15 DIAGNOSIS — E1342 Other specified diabetes mellitus with diabetic polyneuropathy: Secondary | ICD-10-CM | POA: Diagnosis not present

## 2020-09-15 DIAGNOSIS — M21371 Foot drop, right foot: Secondary | ICD-10-CM | POA: Diagnosis not present

## 2020-09-15 DIAGNOSIS — M4712 Other spondylosis with myelopathy, cervical region: Secondary | ICD-10-CM | POA: Diagnosis not present

## 2020-09-15 DIAGNOSIS — M7552 Bursitis of left shoulder: Secondary | ICD-10-CM | POA: Diagnosis not present

## 2020-09-15 DIAGNOSIS — M545 Low back pain, unspecified: Secondary | ICD-10-CM | POA: Diagnosis not present

## 2020-09-17 DIAGNOSIS — M7552 Bursitis of left shoulder: Secondary | ICD-10-CM | POA: Diagnosis not present

## 2020-09-17 DIAGNOSIS — E1342 Other specified diabetes mellitus with diabetic polyneuropathy: Secondary | ICD-10-CM | POA: Diagnosis not present

## 2020-09-17 DIAGNOSIS — M545 Low back pain, unspecified: Secondary | ICD-10-CM | POA: Diagnosis not present

## 2020-09-17 DIAGNOSIS — M4712 Other spondylosis with myelopathy, cervical region: Secondary | ICD-10-CM | POA: Diagnosis not present

## 2020-09-17 DIAGNOSIS — M21371 Foot drop, right foot: Secondary | ICD-10-CM | POA: Diagnosis not present

## 2020-09-17 DIAGNOSIS — M1711 Unilateral primary osteoarthritis, right knee: Secondary | ICD-10-CM | POA: Diagnosis not present

## 2020-09-24 DIAGNOSIS — M7552 Bursitis of left shoulder: Secondary | ICD-10-CM | POA: Diagnosis not present

## 2020-09-24 DIAGNOSIS — M4712 Other spondylosis with myelopathy, cervical region: Secondary | ICD-10-CM | POA: Diagnosis not present

## 2020-09-24 DIAGNOSIS — M545 Low back pain, unspecified: Secondary | ICD-10-CM | POA: Diagnosis not present

## 2020-09-24 DIAGNOSIS — M1711 Unilateral primary osteoarthritis, right knee: Secondary | ICD-10-CM | POA: Diagnosis not present

## 2020-09-24 DIAGNOSIS — E1342 Other specified diabetes mellitus with diabetic polyneuropathy: Secondary | ICD-10-CM | POA: Diagnosis not present

## 2020-09-24 DIAGNOSIS — M21371 Foot drop, right foot: Secondary | ICD-10-CM | POA: Diagnosis not present

## 2020-09-25 DIAGNOSIS — M21371 Foot drop, right foot: Secondary | ICD-10-CM | POA: Diagnosis not present

## 2020-09-25 DIAGNOSIS — M1711 Unilateral primary osteoarthritis, right knee: Secondary | ICD-10-CM | POA: Diagnosis not present

## 2020-09-25 DIAGNOSIS — E1342 Other specified diabetes mellitus with diabetic polyneuropathy: Secondary | ICD-10-CM | POA: Diagnosis not present

## 2020-09-25 DIAGNOSIS — M4712 Other spondylosis with myelopathy, cervical region: Secondary | ICD-10-CM | POA: Diagnosis not present

## 2020-09-25 DIAGNOSIS — M545 Low back pain, unspecified: Secondary | ICD-10-CM | POA: Diagnosis not present

## 2020-09-25 DIAGNOSIS — M7552 Bursitis of left shoulder: Secondary | ICD-10-CM | POA: Diagnosis not present

## 2020-09-28 DIAGNOSIS — E1149 Type 2 diabetes mellitus with other diabetic neurological complication: Secondary | ICD-10-CM | POA: Diagnosis not present

## 2020-09-28 DIAGNOSIS — M2012 Hallux valgus (acquired), left foot: Secondary | ICD-10-CM | POA: Diagnosis not present

## 2020-09-28 DIAGNOSIS — M2042 Other hammer toe(s) (acquired), left foot: Secondary | ICD-10-CM | POA: Diagnosis not present

## 2020-09-28 DIAGNOSIS — M2011 Hallux valgus (acquired), right foot: Secondary | ICD-10-CM | POA: Diagnosis not present

## 2020-09-28 DIAGNOSIS — B351 Tinea unguium: Secondary | ICD-10-CM | POA: Diagnosis not present

## 2020-09-28 DIAGNOSIS — M2041 Other hammer toe(s) (acquired), right foot: Secondary | ICD-10-CM | POA: Diagnosis not present

## 2020-09-29 DIAGNOSIS — M1711 Unilateral primary osteoarthritis, right knee: Secondary | ICD-10-CM | POA: Diagnosis not present

## 2020-09-29 DIAGNOSIS — M21371 Foot drop, right foot: Secondary | ICD-10-CM | POA: Diagnosis not present

## 2020-09-29 DIAGNOSIS — M545 Low back pain, unspecified: Secondary | ICD-10-CM | POA: Diagnosis not present

## 2020-09-29 DIAGNOSIS — E1342 Other specified diabetes mellitus with diabetic polyneuropathy: Secondary | ICD-10-CM | POA: Diagnosis not present

## 2020-09-29 DIAGNOSIS — M7552 Bursitis of left shoulder: Secondary | ICD-10-CM | POA: Diagnosis not present

## 2020-09-29 DIAGNOSIS — M4712 Other spondylosis with myelopathy, cervical region: Secondary | ICD-10-CM | POA: Diagnosis not present

## 2020-10-01 DIAGNOSIS — M7552 Bursitis of left shoulder: Secondary | ICD-10-CM | POA: Diagnosis not present

## 2020-10-01 DIAGNOSIS — E1342 Other specified diabetes mellitus with diabetic polyneuropathy: Secondary | ICD-10-CM | POA: Diagnosis not present

## 2020-10-01 DIAGNOSIS — M545 Low back pain, unspecified: Secondary | ICD-10-CM | POA: Diagnosis not present

## 2020-10-01 DIAGNOSIS — M4712 Other spondylosis with myelopathy, cervical region: Secondary | ICD-10-CM | POA: Diagnosis not present

## 2020-10-01 DIAGNOSIS — M1711 Unilateral primary osteoarthritis, right knee: Secondary | ICD-10-CM | POA: Diagnosis not present

## 2020-10-01 DIAGNOSIS — M21371 Foot drop, right foot: Secondary | ICD-10-CM | POA: Diagnosis not present

## 2020-10-13 DIAGNOSIS — E1342 Other specified diabetes mellitus with diabetic polyneuropathy: Secondary | ICD-10-CM | POA: Diagnosis not present

## 2020-10-13 DIAGNOSIS — M7552 Bursitis of left shoulder: Secondary | ICD-10-CM | POA: Diagnosis not present

## 2020-10-13 DIAGNOSIS — M4712 Other spondylosis with myelopathy, cervical region: Secondary | ICD-10-CM | POA: Diagnosis not present

## 2020-10-13 DIAGNOSIS — M21371 Foot drop, right foot: Secondary | ICD-10-CM | POA: Diagnosis not present

## 2020-10-13 DIAGNOSIS — M1711 Unilateral primary osteoarthritis, right knee: Secondary | ICD-10-CM | POA: Diagnosis not present

## 2020-10-13 DIAGNOSIS — M545 Low back pain, unspecified: Secondary | ICD-10-CM | POA: Diagnosis not present

## 2020-10-15 DIAGNOSIS — M1711 Unilateral primary osteoarthritis, right knee: Secondary | ICD-10-CM | POA: Diagnosis not present

## 2020-10-15 DIAGNOSIS — M21371 Foot drop, right foot: Secondary | ICD-10-CM | POA: Diagnosis not present

## 2020-10-15 DIAGNOSIS — M545 Low back pain, unspecified: Secondary | ICD-10-CM | POA: Diagnosis not present

## 2020-10-15 DIAGNOSIS — M7552 Bursitis of left shoulder: Secondary | ICD-10-CM | POA: Diagnosis not present

## 2020-10-15 DIAGNOSIS — E1342 Other specified diabetes mellitus with diabetic polyneuropathy: Secondary | ICD-10-CM | POA: Diagnosis not present

## 2020-10-15 DIAGNOSIS — M4712 Other spondylosis with myelopathy, cervical region: Secondary | ICD-10-CM | POA: Diagnosis not present

## 2020-11-04 ENCOUNTER — Encounter: Payer: Self-pay | Admitting: Internal Medicine

## 2021-01-18 ENCOUNTER — Telehealth: Payer: Self-pay | Admitting: Internal Medicine

## 2021-01-18 NOTE — Telephone Encounter (Signed)
Of course sch advise will be out x 2 months if another provider needs to see in meantime she can

## 2021-01-18 NOTE — Telephone Encounter (Signed)
Please advise 

## 2021-01-18 NOTE — Telephone Encounter (Signed)
Patient was a Dr.Tracy patient in 2020 and moved away.She has moved back and would like to start seeing Dr.Tracy again as her pcp. Will she take her back? Her number is [phone].

## 2021-01-20 NOTE — Telephone Encounter (Signed)
Noted  

## 2021-01-20 NOTE — Telephone Encounter (Signed)
Patient scheduled for 04/21/21.

## 2021-02-03 LAB — HM DIABETES EYE EXAM

## 2021-03-23 ENCOUNTER — Ambulatory Visit: Payer: Medicare PPO | Admitting: Podiatry

## 2021-03-23 ENCOUNTER — Other Ambulatory Visit: Payer: Self-pay

## 2021-03-23 ENCOUNTER — Ambulatory Visit (INDEPENDENT_AMBULATORY_CARE_PROVIDER_SITE_OTHER): Payer: Medicare PPO

## 2021-03-23 VITALS — BP 143/77 | HR 67 | Temp 99.3°F

## 2021-03-23 DIAGNOSIS — B351 Tinea unguium: Secondary | ICD-10-CM | POA: Diagnosis not present

## 2021-03-23 DIAGNOSIS — M79674 Pain in right toe(s): Secondary | ICD-10-CM

## 2021-03-23 DIAGNOSIS — M7752 Other enthesopathy of left foot: Secondary | ICD-10-CM | POA: Diagnosis not present

## 2021-03-23 DIAGNOSIS — E0843 Diabetes mellitus due to underlying condition with diabetic autonomic (poly)neuropathy: Secondary | ICD-10-CM | POA: Diagnosis not present

## 2021-03-23 DIAGNOSIS — M79675 Pain in left toe(s): Secondary | ICD-10-CM | POA: Diagnosis not present

## 2021-03-23 MED ORDER — BETAMETHASONE SOD PHOS & ACET 6 (3-3) MG/ML IJ SUSP
3.0000 mg | Freq: Once | INTRAMUSCULAR | Status: AC
Start: 2021-03-23 — End: 2021-03-23
  Administered 2021-03-23: 3 mg via INTRA_ARTICULAR

## 2021-03-23 NOTE — Progress Notes (Signed)
   SUBJECTIVE Patient with a history of diabetes mellitus presents to office today complaining of elongated, thickened nails that cause pain while ambulating in shoes.  Patient is unable to trim their own nails.  Patient also complains of left ankle pain this been going on for several years.  Gradual onset.  She denies a history of injury.  She has not done anything for treatment.  Patient is here for further evaluation and treatment.   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 Loann Quill [facility]    Degenerative disk disease    Diabetes [facility])    DVT, lower extremity ([place])    left, 05/2013 on Xarelto x 1 month etiology unkown    GERD (gastroesophageal reflux disease)    HSV-2 infection    Hyperlipidemia    Hypertension    Insomnia    Intertrigo    Lumbar radiculopathy    improved after steroid inj and PT   Peripheral vascular disease ([place])    Subdeltoid bursitis    Vitamin D deficiency     OBJECTIVE General Patient is awake, alert, and oriented x 3 and in no acute distress. Derm Skin is dry and supple bilateral. Negative open lesions or macerations. Remaining integument unremarkable. Nails are tender, long, thickened and dystrophic with subungual debris, consistent with onychomycosis, 1-5 bilateral. No signs of infection noted. Vasc  DP and PT pedal pulses palpable bilaterally. Temperature gradient within normal limits.  Neuro Epicritic and protective threshold sensation diminished bilaterally.  Musculoskeletal Exam no pedal deformities noted.  There is some degenerative changes with pain on palpation and range of motion to the left ankle joint.  Findings consistent with degenerative changes  and arthritis Radiographic exam there is some diffuse degenerative changes noted throughout the pedal and ankle joints of the bilateral feet.  No fractures identified.  Overall normal osseous mineralization given the patient's age.  ASSESSMENT 1. Diabetes Mellitus w/ peripheral neuropathy 2.  Pain due to onychomycosis of toenails bilateral  PLAN OF CARE 1. Patient evaluated today. 2. Instructed to maintain good pedal hygiene and foot care. Stressed importance of controlling blood sugar.  3. Mechanical debridement of nails 1-5 bilaterally performed using a nail nipper. Filed with dremel without incident.  4.  Injection of 0.5 cc Celestone Soluspan injected into the left ankle joint  5.  Continue wearing good supportive shoes  6.  Return to clinic in 3 mos.     Edrick Kins, DPM [facility]  Dr. Edrick Kins, DPM    [address], [place] [address]                Office [phone]  Fax [phone]

## 2021-04-21 ENCOUNTER — Ambulatory Visit (INDEPENDENT_AMBULATORY_CARE_PROVIDER_SITE_OTHER): Payer: Medicare PPO | Admitting: Internal Medicine

## 2021-04-21 ENCOUNTER — Other Ambulatory Visit: Payer: Self-pay

## 2021-04-21 ENCOUNTER — Encounter: Payer: Self-pay | Admitting: Internal Medicine

## 2021-04-21 VITALS — BP 126/84 | HR 70 | Temp 98.1°F | Ht 62.24 in | Wt 172.8 lb

## 2021-04-21 DIAGNOSIS — D126 Benign neoplasm of colon, unspecified: Secondary | ICD-10-CM

## 2021-04-21 DIAGNOSIS — K8020 Calculus of gallbladder without cholecystitis without obstruction: Secondary | ICD-10-CM

## 2021-04-21 DIAGNOSIS — Z1329 Encounter for screening for other suspected endocrine disorder: Secondary | ICD-10-CM

## 2021-04-21 DIAGNOSIS — M25512 Pain in left shoulder: Secondary | ICD-10-CM

## 2021-04-21 DIAGNOSIS — M21371 Foot drop, right foot: Secondary | ICD-10-CM | POA: Diagnosis not present

## 2021-04-21 DIAGNOSIS — I700 Atherosclerosis of aorta: Secondary | ICD-10-CM

## 2021-04-21 DIAGNOSIS — G4733 Obstructive sleep apnea (adult) (pediatric): Secondary | ICD-10-CM

## 2021-04-21 DIAGNOSIS — M5416 Radiculopathy, lumbar region: Secondary | ICD-10-CM | POA: Diagnosis not present

## 2021-04-21 DIAGNOSIS — E1165 Type 2 diabetes mellitus with hyperglycemia: Secondary | ICD-10-CM

## 2021-04-21 DIAGNOSIS — R768 Other specified abnormal immunological findings in serum: Secondary | ICD-10-CM

## 2021-04-21 DIAGNOSIS — I739 Peripheral vascular disease, unspecified: Secondary | ICD-10-CM | POA: Diagnosis not present

## 2021-04-21 DIAGNOSIS — E1159 Type 2 diabetes mellitus with other circulatory complications: Secondary | ICD-10-CM | POA: Diagnosis not present

## 2021-04-21 DIAGNOSIS — G8929 Other chronic pain: Secondary | ICD-10-CM

## 2021-04-21 DIAGNOSIS — Z1231 Encounter for screening mammogram for malignant neoplasm of breast: Secondary | ICD-10-CM

## 2021-04-21 DIAGNOSIS — B351 Tinea unguium: Secondary | ICD-10-CM

## 2021-04-21 DIAGNOSIS — H409 Unspecified glaucoma: Secondary | ICD-10-CM

## 2021-04-21 DIAGNOSIS — M1A9XX0 Chronic gout, unspecified, without tophus (tophi): Secondary | ICD-10-CM

## 2021-04-21 DIAGNOSIS — K449 Diaphragmatic hernia without obstruction or gangrene: Secondary | ICD-10-CM

## 2021-04-21 DIAGNOSIS — I152 Hypertension secondary to endocrine disorders: Secondary | ICD-10-CM

## 2021-04-21 DIAGNOSIS — Z9989 Dependence on other enabling machines and devices: Secondary | ICD-10-CM

## 2021-04-21 DIAGNOSIS — N179 Acute kidney failure, unspecified: Secondary | ICD-10-CM

## 2021-04-21 DIAGNOSIS — E559 Vitamin D deficiency, unspecified: Secondary | ICD-10-CM

## 2021-04-21 DIAGNOSIS — I2510 Atherosclerotic heart disease of native coronary artery without angina pectoris: Secondary | ICD-10-CM

## 2021-04-21 DIAGNOSIS — M1990 Unspecified osteoarthritis, unspecified site: Secondary | ICD-10-CM

## 2021-04-21 MED ORDER — LOSARTAN POTASSIUM 25 MG PO TABS
25.0000 mg | ORAL_TABLET | Freq: Every day | ORAL | 3 refills | Status: DC
Start: 2021-04-21 — End: 2021-05-27

## 2021-04-21 MED ORDER — TRULICITY 0.75 MG/0.5ML ~~LOC~~ SOAJ
0.7500 mg | SUBCUTANEOUS | 0 refills | Status: DC
Start: 2021-04-21 — End: 2021-09-30

## 2021-04-21 MED ORDER — LANTUS SOLOSTAR 100 UNIT/ML ~~LOC~~ SOPN: 40.0000 [IU] | PEN_INJECTOR | Freq: Every day | SUBCUTANEOUS | 11 refills | Status: DC

## 2021-04-21 NOTE — Progress Notes (Signed)
Chief Complaint  Patient presents with   Establish Care   Former pt Dr. Kelly [facility] at [place] moved away to [place] now back to reestablish care. Here with niece today  She has had some health issues while gone since 2020 and records faxed to me to reviewed and reviewed in entirety   1. PAD s/p stents x 3 between b/l legs and requests vascular referral  2. H/o neck surgery and h/o right foot drop with lumbar radiculopathy BL walks with cane consider Neurology and NS referral in the future  She wants to resume PT 3. Htn with Dm A1C over years from >9 to 7.2 will repeat labs on insulin 40 units trulicity 8.31 weekly, januvia 100 mg qd, norvasc 5 mg qd, pravachol 40 mg qhs, tenoretic 50-25 1/2 pill qd losartan 25 and zetia 10  4. C/o 6/10 left shoulder pain    Review of Systems  Constitutional:  Negative for weight loss.  HENT:  Negative for hearing loss.   Eyes:  Negative for blurred vision.  Respiratory:  Negative for shortness of breath.   Cardiovascular:  Negative for chest pain.  Gastrointestinal:  Negative for abdominal pain and blood in stool.  Genitourinary:  Negative for dysuria.  Musculoskeletal:  Positive for back pain, falls and joint pain.  Skin:  Negative for rash.  Neurological:  Negative for headaches.       +right foot drop  Psychiatric/Behavioral:  Negative for depression.   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 Loann Quill [facility]    COVID 19+    mid 11/2020    Degenerative disk disease    Diabetes ([place])    DVT (deep venous thrombosis) ([place])    DVT, lower extremity ([place])    left, 05/2013 on Xarelto x 1 month etiology unkown    GERD (gastroesophageal reflux disease)    Gout    H/O myocardial perfusion scan    06/08/20 low risk   Hammer toes, bilateral    HSV-2 infection    Hyperlipidemia    Hypertension    Insomnia    Intertrigo    Lumbar radiculopathy    improved after steroid inj and PT 10/21/20 had right L4/5 L5-S1 transforaminal epidural steroid injections   Onychomycosis    OSA on CPAP    dx in 2020 after hip surgery CPAP 9-13 cm H20 supplies [employer] PSG 03/18/2019 severe OSA AHI 42.1/hr titration 03/27/2019 rec APAP 9-13 cm H20   PAD (peripheral artery disease) ([place])    1 x right, 3 x left in 2020-2022 needs bypass on leg had CTA with run off 11/2020 in [place]   PAD (peripheral artery disease) (HCC)    with stents   Peripheral vascular disease ([place])    Right foot drop    since 2020   Subdeltoid bursitis    Tinea pedis    Vitamin D deficiency  Past Surgical History:  Procedure Laterality Date   BACK SURGERY     cervical laminoplasty C3-C7 Rex Dr. Sheppard Evens fusion/decompression 2/2 myelopathy 03/2014 or 02/2015   COLON SURGERY     colectomy in 08/18/13   COLONOSCOPY  05/01/2020   [place] [facility] [place] [facility] [place] Dr. Edwena Bunde [place] 12 mm d colon polyp, grade 1 IH, moderate diverticulosis tubular   ESOPHAGOGASTRODUODENOSCOPY  05/01/2020   erosive gastritis Dr. Langley Gauss [place]   right total hip Right    03/05/2019 or 02/26/2020   TUBAL LIGATION     UPPER GI ENDOSCOPY     05/01/20   VASCULAR SURGERY     08/19/19   VASCULAR SURGERY     07/05/19   VASCULAR SURGERY     05/21/2019 angioplasty since 05/2019, 06/2019 and 08/2019 had 2 left leg and 1 on right leg   Family History  Problem Relation Age of Onset   Hypertension Mother    Arthritis Mother    Cancer Father        colon cancer dx'ed died age 76   Hypertension Sister     Arthritis Sister    Cancer Sister        died in 33s brain tumor h/o lung cancer smoker died August 19, 2015    Early death Sister    Breast cancer Maternal Grandmother    Cancer Maternal Grandmother        breast dx older age    Hypertension Maternal Grandmother    Breast cancer Other        maternal neice   Cancer Other        died in 28s    Hypertension Daughter    Hypertension Daughter    Social History   Socioeconomic History   Marital status: Divorced    Spouse name: Not on file   Number of children: Not on file   Years of education: Not on file   Highest education level: Not on file  Occupational History   Not on file  Tobacco Use   Smoking status: Former    Packs/day: 1.00    Years: 45.00    Pack years: 45.00    Types: Cigarettes    Quit date: 12/18/2012    Years since quitting: 8.4   Smokeless tobacco: Never  Vaping Use   Vaping Use: Never used  Substance and Sexual Activity   Alcohol use: No   Drug use: Yes    Frequency: 3.0 times per week   Sexual activity: Not on file  Other Topics Concern   Not on file  Social History Narrative   Divorced    2 daughters [name] comes to most appts and is Chief Executive Officer    -lives with daughter [name]    Used to work for [employer] now [employer]    Former smoker age 4 to age 75/2012 quit for sure in 2012-08-18 max 1/2 ppd FH lung cancer sister also smoker    Social Determinants of Radio broadcast assistant Strain: Not on file  Food Insecurity: Not on file  Transportation Needs: Not on file  Physical Activity: Not on file  Stress: Not on file  Social Connections: Not on file  Intimate Partner Violence: Not on file   Current Meds  Medication Sig   amLODipine (NORVASC) 5 MG tablet Take 5 mg by mouth daily.   atenolol-chlorthalidone (TENORETIC) 50-25 MG tablet Take 0.5 tablets by mouth daily.   B-D UF III MINI PEN NEEDLES 31G X  5 MM MISC Inject 1 each into the skin daily.   Blood Glucose Monitoring Suppl (ONE TOUCH ULTRA  2) w/Device KIT Used to check blood sugars daily.   Cholecalciferol (VITAMIN D3) 50 MCG (2000 UT) capsule Take 1 capsule by mouth daily.   clopidogrel (PLAVIX) 75 MG tablet Take 75 mg by mouth daily.   Continuous Blood Gluc Sensor (FREESTYLE LIBRE 2 SENSOR) MISC by Does not apply route every 14 (fourteen) days.   EPIPEN 2-PAK 0.3 MG/0.3ML SOAJ injection  (Patient not taking: Reported on 05/24/2021)   ezetimibe (ZETIA) 10 MG tablet Take 10 mg by mouth daily.   glucose blood test strip Use 3 times daily before meals.   Lancets (ONETOUCH DELICA PLUS [id number]) MISC 1 Device by Does not apply route 2 (two) times daily. E11.9   omeprazole (PRILOSEC) 40 MG capsule Take 40 mg by mouth daily.   ONETOUCH DELICA LANCETS FINE MISC 1 each by Does not apply route 4 (four) times daily as needed.   pravastatin (PRAVACHOL) 40 MG tablet Take 1 tablet (40 mg total) by mouth daily. At night   sitaGLIPtin (JANUVIA) 100 MG tablet Take 1 tablet (100 mg total) by mouth daily.   XARELTO 2.5 MG TABS tablet Take 2.5 mg by mouth 2 (two) times daily.   [DISCONTINUED] Dulaglutide (TRULICITY) 5.03 UU/8.2CM SOPN Inject 0.75 mg into the skin once a week.   [DISCONTINUED] LANTUS SOLOSTAR 100 UNIT/ML Solostar Pen Inject 53 Units into the skin daily after breakfast. (Patient taking differently: Inject 40 Units into the skin daily after breakfast.)   [DISCONTINUED] losartan (COZAAR) 25 MG tablet Take 25 mg by mouth daily.   Allergies  Allergen Reactions   Lisinopril Anaphylaxis and Swelling     Tongue swelling    Sulfamethoxazole-Trimethoprim Other (See Comments)    Other reaction(s): Other (See Comments)   Farxiga [Dapagliflozin]     diarrhea   Glipizide     Diarrhea    Metformin And Related     GI sx's diarrhea   Penicillins     Childhood   Tramadol Nausea Only and Nausea And Vomiting   Recent Results (from the past 2160 hour(s))  Microalbumin / creatinine urine ratio     Status: None   Collection Time: 04/27/21   7:35 AM  Result Value Ref Range   Creatinine, Urine 150 20 - 275 mg/dL   Microalb, Ur 0.8 mg/dL    Comment: Reference Range Not established    Microalb Creat Ratio 5 <30 mcg/mg creat    Comment: . The ADA defines abnormalities in albumin excretion as follows: Marland Kitchen Albuminuria Category        Result (mcg/mg creatinine) . Normal to Mildly increased   <30 Moderately increased         30-299  Severely increased           > OR = 300 . The ADA recommends that at least two of three specimens collected within a 3-6 month period be abnormal before considering a patient to be within a diagnostic category.   Vitamin D (25 hydroxy)     Status: None   Collection Time: 04/27/21  7:35 AM  Result Value Ref Range   VITD 43.55 30.00 - 100.00 ng/mL  Urinalysis, Routine w reflex microscopic     Status: None   Collection Time: 04/27/21  7:35 AM  Result Value Ref Range   Color, Urine YELLOW YELLOW   APPearance CLEAR CLEAR   Specific Gravity, Urine 1.018 1.001 - 1.035  pH 5.5 5.0 - 8.0   Glucose, UA NEGATIVE NEGATIVE   Bilirubin Urine NEGATIVE NEGATIVE   Ketones, ur NEGATIVE NEGATIVE   Hgb urine dipstick NEGATIVE NEGATIVE   Protein, ur NEGATIVE NEGATIVE   Nitrite NEGATIVE NEGATIVE   Leukocytes,Ua NEGATIVE NEGATIVE  TSH     Status: None   Collection Time: 04/27/21  7:35 AM  Result Value Ref Range   TSH 3.03 0.35 - 5.50 uIU/mL  CBC w/Diff     Status: None   Collection Time: 04/27/21  7:35 AM  Result Value Ref Range   WBC 6.5 4.0 - 10.5 K/uL   RBC 4.42 3.87 - 5.11 Mil/uL   Hemoglobin 12.2 12.0 - 15.0 g/dL   HCT 37.7 36.0 - 46.0 %   MCV 85.2 78.0 - 100.0 fl   MCHC 32.3 30.0 - 36.0 g/dL   RDW 15.3 11.5 - 15.5 %   Platelets 154.0 150.0 - 400.0 K/uL   Neutrophils Relative % 54.8 43.0 - 77.0 %   Lymphocytes Relative 36.5 12.0 - 46.0 %   Monocytes Relative 6.3 3.0 - 12.0 %   Eosinophils Relative 2.1 0.0 - 5.0 %   Basophils Relative 0.3 0.0 - 3.0 %   Neutro Abs 3.6 1.4 - 7.7 K/uL   Lymphs  Abs 2.4 0.7 - 4.0 K/uL   Monocytes Absolute 0.4 0.1 - 1.0 K/uL   Eosinophils Absolute 0.1 0.0 - 0.7 K/uL   Basophils Absolute 0.0 0.0 - 0.1 K/uL  Hemoglobin A1c     Status: Abnormal   Collection Time: 04/27/21  7:35 AM  Result Value Ref Range   Hgb A1c MFr Bld 7.2 (H) 4.6 - 6.5 %    Comment: Glycemic Control Guidelines for People with Diabetes:Non Diabetic:  <6%Goal of Therapy: <7%Additional Action Suggested:  >8%   Lipid panel     Status: None   Collection Time: 04/27/21  7:35 AM  Result Value Ref Range   Cholesterol 133 0 - 200 mg/dL    Comment: ATP III Classification       Desirable:  < 200 mg/dL               Borderline High:  200 - 239 mg/dL          High:  > = 240 mg/dL   Triglycerides 91.0 0.0 - 149.0 mg/dL    Comment: Normal:  <150 mg/dLBorderline High:  150 - 199 mg/dL   HDL 48.00 >39.00 mg/dL   VLDL 18.2 0.0 - 40.0 mg/dL   LDL Cholesterol 67 0 - 99 mg/dL   Total CHOL/HDL Ratio 3     Comment:                Men          Women1/2 Average Risk     3.4          3.3Average Risk          5.0          4.42X Average Risk          9.6          7.13X Average Risk          15.0          11.0                       NonHDL 84.79     Comment: NOTE:  Non-HDL goal should be 30 mg/dL higher than patient's LDL goal (i.e.  LDL goal of < 70 mg/dL, would have non-HDL goal of < 100 mg/dL)  Comprehensive metabolic panel     Status: Abnormal   Collection Time: 04/27/21  7:35 AM  Result Value Ref Range   Sodium 142 135 - 145 mEq/L   Potassium 3.8 3.5 - 5.1 mEq/L   Chloride 104 96 - 112 mEq/L   CO2 28 19 - 32 mEq/L   Glucose, Bld 82 70 - 99 mg/dL   BUN 27 (H) 6 - 23 mg/dL   Creatinine, Ser 1.43 (H) 0.40 - 1.20 mg/dL   Total Bilirubin 0.5 0.2 - 1.2 mg/dL   Alkaline Phosphatase 77 39 - 117 U/L   AST 19 0 - 37 U/L   ALT 10 0 - 35 U/L   Total Protein 7.3 6.0 - 8.3 g/dL   Albumin 4.1 3.5 - 5.2 g/dL   GFR 35.83 (L) >60.00 mL/min    Comment: Calculated using the CKD-EPI Creatinine Equation (2021)    Calcium 9.5 8.4 - 10.5 mg/dL  Protein Electrophoresis, Urine Rflx.     Status: None   Collection Time: 05/18/21 10:36 AM  Result Value Ref Range   Protein, Ur 7.1 Not Estab. mg/dL   Albumin ELP, Urine 100.0 %   Alpha-1-Globulin, U 0.0 %   Alpha-2-Globulin, U 0.0 %   Beta Globulin, U 0.0 %   Gamma Globulin, U 0.0 %   M Component, Ur Not Observed Not Observed %   Please Note: Comment     Comment: Protein electrophoresis scan will follow via computer, mail, or courier delivery.   Protein Electrophoresis, (serum)     Status: Abnormal   Collection Time: 05/18/21 10:36 AM  Result Value Ref Range   Total Protein 7.8 6.1 - 8.1 g/dL   Albumin ELP 3.8 3.8 - 4.8 g/dL   Alpha 1 0.4 (H) 0.2 - 0.3 g/dL   Alpha 2 1.2 (H) 0.5 - 0.9 g/dL   Beta Globulin 0.4 0.4 - 0.6 g/dL   Beta 2 0.5 0.2 - 0.5 g/dL   Gamma Globulin 1.5 0.8 - 1.7 g/dL   SPE Interp.      Comment: . One or more serum protein fractions are outside the normal ranges.  No abnormal protein bands are apparent. .   Antinuclear Antib (ANA)     Status: Abnormal   Collection Time: 05/18/21 10:36 AM  Result Value Ref Range   Anti Nuclear Antibody (ANA) POSITIVE (A) NEGATIVE    Comment: ANA IFA is a first line screen for detecting the presence of up to approximately 150 autoantibodies in various autoimmune diseases. A positive ANA IFA result is suggestive of autoimmune disease and reflexes to titer and pattern. Further laboratory testing may be considered if clinically indicated. . For additional information, please refer to http://education.[facility].com/faq/FAQ177 (This link is being provided for informational/ educational purposes only.) .   Microalbumin / creatinine urine ratio     Status: None   Collection Time: 05/18/21 10:36 AM  Result Value Ref Range   Creatinine, Urine 102.0 Not Estab. mg/dL   Microalbumin, Urine 10.4 Not Estab. ug/mL   Microalb/Creat Ratio 10 0 - 29 mg/g creat    Comment:                         Normal:                0 -  29  Moderately increased: 30 - 300                        Severely increased:       >828   Basic Metabolic Panel (BMET)     Status: Abnormal   Collection Time: 05/18/21 10:36 AM  Result Value Ref Range   Sodium 139 135 - 145 mEq/L   Potassium 4.3 3.5 - 5.1 mEq/L   Chloride 102 96 - 112 mEq/L   CO2 26 19 - 32 mEq/L   Glucose, Bld 74 70 - 99 mg/dL   BUN 22 6 - 23 mg/dL   Creatinine, Ser 1.39 (H) 0.40 - 1.20 mg/dL   GFR 37.06 (L) >60.00 mL/min    Comment: Calculated using the CKD-EPI Creatinine Equation (2021)   Calcium 9.5 8.4 - 10.5 mg/dL  Sodium, urine, random     Status: None   Collection Time: 05/18/21 10:36 AM  Result Value Ref Range   Sodium, Ur 121 Not Estab. mmol/L  Anti-nuclear ab-titer (ANA titer)     Status: Abnormal   Collection Time: 05/18/21 10:36 AM  Result Value Ref Range   ANA Titer 1 1:320 (H) titer    Comment:                 Reference Range                 <1:40        Negative                 1:40-1:80    Low Antibody Level                 >1:80        Elevated Antibody Level .    ANA Pattern 1 Nuclear, Homogeneous (A)     Comment: Homogeneous pattern is associated with systemic lupus erythematosus (SLE), drug-induced lupus and juvenile idiopathic arthritis. . AC-1: Homogeneous . International Consensus on ANA Patterns ([email or web address]    ANA TITER 1:320 (H) titer    Comment:                 Reference Range                 <1:40        Negative                 1:40-1:80    Low Antibody Level                 >1:80        Elevated Antibody Level .    ANA PATTERN Nuclear, Speckled (A)     Comment: Speckled pattern is associated with mixed connective tissue disease (MCTD), systemic lupus erythematosus (SLE), Sjogren's syndrome, dermatomyositis, and  systemic sclerosis/polymyositis overlap. . AC-2,4,5,29: Speckled . International Consensus on ANA  Patterns ([email or web address]    Objective  Body mass index is 31.36 kg/m. Wt Readings from Last 3 Encounters:  05/24/21 169 lb (76.7 kg)  04/21/21 172 lb 12.8 oz (78.4 kg)  07/13/18 167 lb 4 oz (75.9 kg)   Temp Readings from Last 3 Encounters:  05/24/21 98.1 F (36.7 C)  04/21/21 98.1 F (36.7 C) (Oral)  03/23/21 99.3 F (37.4 C)   BP Readings from Last 3 Encounters:  05/24/21 112/69  04/21/21 126/84  03/23/21 (!) 143/77   Pulse Readings from Last 3 Encounters:  05/24/21 78  04/21/21 70  03/23/21 67  Physical Exam Vitals and nursing note reviewed.  Constitutional:      Appearance: Normal appearance. She is well-developed and well-groomed.  HENT:     Head: Normocephalic and atraumatic.  Eyes:     Conjunctiva/sclera: Conjunctivae normal.     Pupils: Pupils are equal, round, and reactive to light.  Cardiovascular:     Rate and Rhythm: Normal rate and regular rhythm.     Heart sounds: Normal heart sounds. No murmur heard. Pulmonary:     Effort: Pulmonary effort is normal.     Breath sounds: Normal breath sounds.  Abdominal:     General: Abdomen is flat. Bowel sounds are normal.     Tenderness: There is no abdominal tenderness.  Musculoskeletal:        General: No tenderness.  Skin:    General: Skin is warm and dry.  Neurological:     General: No focal deficit present.     Mental Status: She is alert and oriented to person, place, and time. Mental status is at baseline.     Cranial Nerves: Cranial nerves 2-12 are intact.     Gait: Gait is intact.     Comments: Right foot drop    Psychiatric:        Attention and Perception: Attention and perception normal.        Mood and Affect: Mood and affect normal.        Speech: Speech normal.        Behavior: Behavior normal. Behavior is cooperative.        Thought Content: Thought content normal.        Cognition and Memory: Cognition and memory normal.        Judgment: Judgment normal.     Assessment  Plan  Hypertension associated with diabetes ([place]) with AKI  - Plan: Comprehensive metabolic panel, Lipid panel, Hemoglobin A1c, CBC w/Diff, TSH, Urinalysis, Routine w reflex microscopic, Microalbumin / creatinine urine ratio, losartan (COZAAR) 25 MG tablet, Dulaglutide (TRULICITY) 9.75 OI/3.2PQ SOPN insulin 40 units trulicity 9.82 weekly, januvia 100 mg qd, norvasc 5 mg qd, pravachol 40 mg qhs, tenoretic 50-25 1/2 pill qd losartan 25 and zetia 10   After lab results 05/18/21  Maybe due to fluid pill  Is she ok to increase losartan to 50 mg?  Stop tenoretic atenolol/chlorthalidone 1/2 pill daily? And ADD bystolic 5 mg daily for blood pressure?  Monitor BP goal <130/<80    Right foot drop - Plan: Ambulatory referral to Physical Therapy Lumbar radiculopathy - Plan: Ambulatory referral to Physical Therapy Consider Neurology and NS in future   PAD (peripheral artery disease) ([place]) - Plan: Ambulatory referral to Vascular Surgery Consider change pravachol 40 to stronger intensity lipitor or crestor in the future  Cont plavix   OSA on CPAP  Glaucoma of both eyes, unspecified glaucoma type Will need to f/u eye MD  Onychomycosis  Arthritis + ana  Consider rheumatology referral with left shoulder pain and h/o gout  HM Flu shot 04/09/21 Given hep B 3/3 shots  pna 23 vx had 11/27/08, 02/26/20 given Dr. Darcel Smalling office  prevnar had 06/20/17 per Dr. Kasandra Knudsen note had in [place] but she had in our office 10/17/17 Will disc shingrix in the future  Td had 02/14/11 and 02/17/18 ?Td vs tdap Covid 08/29/19, 09/26/19, 04/29/20, 12/02/20 had covid 11/2020 +, consider 5th dose   Shingrix ? If had was noted in Dr. Darcel Smalling note as recorded 06/20/17 ? If had   appt GI Dr. Darien Ramus [phone])  [phone] colonoscopy 01/2018  Colonoscopy EGD see report from [place] 05/01/20   CT chest with chronic smoking hx quit 2012 or 2014 former smoker 20-30 years 1 pk lasts 2 days  Outside CT chest in [place] w/o contrast 03/13/20 lung  small nodules Due for repeat CT chest 02/2021   [place] scan 03/13/20 no AAA aortic atherosclerosis  Diabetic eye exam 01/2020 normal per Dr. Kasandra Knudsen  DM foot exam 03/24/20 per Dr. Kasandra Knudsen Urine protein due 06/05/21 per Dr. Kasandra Knudsen   Dr. Kasandra Knudsen echo 04/2020 EF 65-70% mild concentric LVH, mild diastolic dysfunction low risk myocardial perfusion scan 05/2020   Out of age window pap   mammo 02/20/18 neg, 04/06/20 negative/normal  -ordered mammogram   DEXA 10/30/13 osteopenia will rec vit D3 1000-2000 IU qd and calcium 600 mg bid  -consider repeat dexa if not had   OSA on cpap since 2020 apap 9-13 cmH20   New patient reviewed of multiple records and plan of care   Provider: Dr. Olivia Mackie  [facility]

## 2021-04-21 NOTE — Patient Instructions (Addendum)
[phone] [email or web address] [name] [id number]      Specialties     Vascular Surgery, Cardiology            [facility]   Consider neurosurgery consult Dr. Ellis Savage   for drop foot Consider ophthalmology [facility]  Let me know if you want either referral   Peripheral Vascular Disease Peripheral vascular disease (PVD) is a disease of the blood vessels. PVD may also be called peripheral artery disease (PAD) or poor circulation. PVD is the blocking or hardening of the arteries anywhere within the circulatory system beyond the heart. This can result in a decreased supply of blood to the arms, legs, and internal organs, such as the stomach or kidneys. However, PVD most often affects a person's lower legs and feet. Without treatment, PVD often worsens. PVD can lead to acute limb ischemia. This occurs when an arm or leg suddenly has trouble getting enough blood. This is a medical emergency. What are the causes? The most common cause of PVD is atherosclerosis. This is a buildup of fatty material and other substances (plaque)inside your arteries. Pieces of plaque can break off from the walls of an artery and become stuck in a smaller artery, blocking blood flow and possibly causing acute limb ischemia. Other common causes of PVD include: Blood clots that form inside the blood vessels. Injuries to blood vessels. Diseases that cause inflammation of blood vessels or cause blood vessel tightening (spasms). What increases the risk? The following factors may make you more likely to develop this condition: A family history of PVD. Common medical conditions, including: High cholesterol. Diabetes. High blood pressure (hypertension). Heart disease. Known atherosclerotic disease in another area of the body. Past injury, such as burns or a broken bone. Other medical conditions, such  as: Buerger's disease. This is caused by inflamed blood vessels in your hands and feet. Some forms of arthritis. Birth defects that affect the arteries in your legs. Kidney disease. Using tobacco and nicotine products. Not getting enough exercise. Obesity. Being age 70 or older, or being age 37 or older and having the other risk factors. What are the signs or symptoms? This condition may cause different symptoms. Your symptoms depend on what body part is not getting enough blood. Common signs and symptoms include: Cramps in your buttocks, legs, and feet. Intermittent claudication. This is pain and weakness in your legs during activity that resolves with rest. Leg pain at rest and leg numbness, tingling, or weakness. Coldness in a leg or foot, especially when compared to the other leg or foot. Skin or hair changes. These can include: Hair loss. Shiny skin. Pale or bluish skin. Thick toenails. Inability to get or maintain an erection (erectile dysfunction). Tiredness (fatigue). Weak pulse or no pulse in the feet. People with PVD are more likely to develop open wounds (ulcers) and sores on their toes, feet, or legs. The ulcers or sores may take longer than normal to heal. How is this diagnosed? PVD is diagnosed based on your signs and symptoms, a physical exam, and your medical history. You may also have other tests to find the cause. Tests include: Ankle-brachial index test.This test compares the blood pressure readings of the legs and arms. This may also include an exercise ankle-brachial index test in which you walk on a treadmill to check your symptoms. Doppler ultrasound. This takes pictures of blood flow through your blood vessels. Imaging studies that use dye to  show blood flow. These are: CT angiogram. Magnetic resonance angiogram, or MRA. How is this treated? Treatment for PVD depends on the cause of your condition, how severe your symptoms are, and your age. Underlying causes  need to be treated and controlled. These include long-term (chronic) conditions, such as diabetes, high cholesterol, and hypertension. Treatment may include: Lifestyle changes, such as: Quitting tobacco use. Exercising regularly. Following a low-fat, low-cholesterol diet. Not drinking alcohol. Taking medicines, such as: Blood thinners to prevent blood clots. Medicines to improve blood flow. Medicines to improve cholesterol levels. Procedures, such as: Angioplasty. This uses an inflated balloon to open a blocked artery and improve blood flow. Stent implant. This inserts a small mesh tube to keep a blocked artery open. Peripheral bypass surgery. This reroutes blood flow around a blocked artery. Surgery to remove dead tissue from an infected wound (debridement). Amputation. This is surgical removal of the affected limb. It may be necessary in cases of acute limb ischemia when medical or surgical treatments have not helped. Follow these instructions at home: Medicines Take over-the-counter and prescription medicines only as told by your health care provider. If you are taking blood thinners: Talk with your health care provider before you take any medicines that contain aspirin or NSAIDs, such as ibuprofen. These medicines increase your risk for dangerous bleeding. Take your medicine exactly as told, at the same time every day. Avoid activities that could cause injury or bruising, and follow instructions about how to prevent falls. Wear a medical alert bracelet or carry a card that lists what medicines you take. Lifestyle   Exercise regularly. Ask your health care provider about some good activities for you. Talk with your health care provider about maintaining a healthy weight. If needed, ask about losing weight. Eat a diet that is low in fat and cholesterol. If you need help, talk with your health care provider. Do not drink alcohol. Do not use any products that contain nicotine or  tobacco. These products include cigarettes, chewing tobacco, and vaping devices, such as e-cigarettes. If you need help quitting, ask your health care provider. General instructions Take good care of your feet. To do this: Wear comfortable shoes that fit well. Check your feet often for any cuts or sores. Get an annual influenza vaccine. Keep all follow-up visits. This is important. Where to find more information Society for Vascular Surgery: vascular.org American Heart Association: heart.org National Heart, Lung, and Blood Institute: https://www.[place]man-[place].biz/ Contact a health care provider if: You have leg cramps while walking. You have leg pain when you rest. Your leg or foot feels cold. Your skin changes color. You have erectile dysfunction. You have cuts or sores on your legs or feet that do not heal. Get help right away if: You have sudden changes in color and feeling of your arms or legs, such as: Your arm or leg turns cold, numb, and blue. Your arm or leg becomes red, warm, swollen, painful, or numb. You have any symptoms of a stroke. "BE FAST" is an easy way to remember the main warning signs of a stroke: B - Balance. Signs are dizziness, sudden trouble walking, or loss of balance. E - Eyes. Signs are trouble seeing or a sudden change in vision. F - Face. Signs are sudden weakness or numbness of the face, or the face or eyelid drooping on one side. A - Arms. Signs are weakness or numbness in an arm. This happens suddenly and usually on one side of the body. S - Speech.  Signs are sudden trouble speaking, slurred speech, or trouble understanding what people say. T - Time. Time to call emergency services. Write down what time symptoms started. You have other signs of a stroke, such as: A sudden, severe headache with no known cause. Nausea or vomiting. Seizure. You have chest pain or trouble breathing. These symptoms may represent a serious problem that is an emergency. Do not wait to see if  the symptoms will go away. Get medical help right away. Call your local emergency services (911 in the [place] Do not drive yourself to the hospital. Summary Peripheral vascular disease (PVD) is a disease of the blood vessels. PVD is the blocking or hardening of the arteries anywhere within the circulatory system beyond the heart. PVD may cause different symptoms. Your symptoms depend on what part of your body is not getting enough blood. Treatment for PVD depends on what caused it, how severe your symptoms are, and your age. This information is not intended to replace advice given to you by your health care provider. Make sure you discuss any questions you have with your health care provider. Document Revised: 12/09/2019 Document Reviewed: 12/09/2019 Elsevier Patient Education  [place].

## 2021-04-22 ENCOUNTER — Telehealth: Payer: Self-pay | Admitting: Internal Medicine

## 2021-04-22 NOTE — Telephone Encounter (Signed)
Patient called and is requesting a referral to have her mammogram at [facility].

## 2021-04-22 NOTE — Telephone Encounter (Signed)
Please advise, okay to order mammogram?

## 2021-04-22 NOTE — Telephone Encounter (Signed)
Order in can call to schedule

## 2021-04-23 NOTE — Telephone Encounter (Signed)
Patient informed and verbalized understanding

## 2021-04-27 ENCOUNTER — Other Ambulatory Visit: Payer: Self-pay

## 2021-04-27 ENCOUNTER — Other Ambulatory Visit (INDEPENDENT_AMBULATORY_CARE_PROVIDER_SITE_OTHER): Payer: Medicare PPO

## 2021-04-27 DIAGNOSIS — I152 Hypertension secondary to endocrine disorders: Secondary | ICD-10-CM

## 2021-04-27 DIAGNOSIS — E559 Vitamin D deficiency, unspecified: Secondary | ICD-10-CM

## 2021-04-27 DIAGNOSIS — Z1329 Encounter for screening for other suspected endocrine disorder: Secondary | ICD-10-CM

## 2021-04-27 DIAGNOSIS — E1159 Type 2 diabetes mellitus with other circulatory complications: Secondary | ICD-10-CM

## 2021-04-27 LAB — CBC WITH DIFFERENTIAL/PLATELET
Basophils Absolute: 0 10*3/uL (ref 0.0–0.1)
Basophils Relative: 0.3 % (ref 0.0–3.0)
Eosinophils Absolute: 0.1 10*3/uL (ref 0.0–0.7)
Eosinophils Relative: 2.1 % (ref 0.0–5.0)
HCT: 37.7 % (ref 36.0–46.0)
Hemoglobin: 12.2 g/dL (ref 12.0–15.0)
Lymphocytes Relative: 36.5 % (ref 12.0–46.0)
Lymphs Abs: 2.4 10*3/uL (ref 0.7–4.0)
MCHC: 32.3 g/dL (ref 30.0–36.0)
MCV: 85.2 fl (ref 78.0–100.0)
Monocytes Absolute: 0.4 10*3/uL (ref 0.1–1.0)
Monocytes Relative: 6.3 % (ref 3.0–12.0)
Neutro Abs: 3.6 10*3/uL (ref 1.4–7.7)
Neutrophils Relative %: 54.8 % (ref 43.0–77.0)
Platelets: 154 10*3/uL (ref 150.0–400.0)
RBC: 4.42 Mil/uL (ref 3.87–5.11)
RDW: 15.3 % (ref 11.5–15.5)
WBC: 6.5 10*3/uL (ref 4.0–10.5)

## 2021-04-27 LAB — COMPREHENSIVE METABOLIC PANEL
ALT: 10 U/L (ref 0–35)
AST: 19 U/L (ref 0–37)
Albumin: 4.1 g/dL (ref 3.5–5.2)
Alkaline Phosphatase: 77 U/L (ref 39–117)
BUN: 27 mg/dL — ABNORMAL HIGH (ref 6–23)
CO2: 28 mEq/L (ref 19–32)
Calcium: 9.5 mg/dL (ref 8.4–10.5)
Chloride: 104 mEq/L (ref 96–112)
Creatinine, Ser: 1.43 mg/dL — ABNORMAL HIGH (ref 0.40–1.20)
GFR: 35.83 mL/min — ABNORMAL LOW (ref >60.00)
Glucose, Bld: 82 mg/dL (ref 70–99)
Potassium: 3.8 mEq/L (ref 3.5–5.1)
Sodium: 142 mEq/L (ref 135–145)
Total Bilirubin: 0.5 mg/dL (ref 0.2–1.2)
Total Protein: 7.3 g/dL (ref 6.0–8.3)

## 2021-04-27 LAB — LIPID PANEL
Cholesterol: 133 mg/dL (ref 0–200)
HDL: 48 mg/dL (ref >39.00)
LDL Cholesterol: 67 mg/dL (ref 0–99)
NonHDL: 84.79
Total CHOL/HDL Ratio: 3
Triglycerides: 91 mg/dL (ref 0.0–149.0)
VLDL: 18.2 mg/dL (ref 0.0–40.0)

## 2021-04-27 LAB — HEMOGLOBIN A1C: Hgb A1c MFr Bld: 7.2 % — ABNORMAL HIGH (ref 4.6–6.5)

## 2021-04-27 LAB — TSH: TSH: 3.03 u[IU]/mL (ref 0.35–5.50)

## 2021-04-27 LAB — VITAMIN D 25 HYDROXY (VIT D DEFICIENCY, FRACTURES): VITD: 43.55 ng/mL (ref 30.00–100.00)

## 2021-04-28 ENCOUNTER — Other Ambulatory Visit: Payer: Self-pay | Admitting: *Deleted

## 2021-04-28 DIAGNOSIS — I739 Peripheral vascular disease, unspecified: Secondary | ICD-10-CM

## 2021-04-29 ENCOUNTER — Telehealth: Payer: Self-pay

## 2021-04-29 DIAGNOSIS — E1159 Type 2 diabetes mellitus with other circulatory complications: Secondary | ICD-10-CM

## 2021-04-29 DIAGNOSIS — I152 Hypertension secondary to endocrine disorders: Secondary | ICD-10-CM

## 2021-04-29 LAB — URINALYSIS, ROUTINE W REFLEX MICROSCOPIC
Bilirubin Urine: NEGATIVE
Glucose, UA: NEGATIVE
Hgb urine dipstick: NEGATIVE
Ketones, ur: NEGATIVE
Leukocytes,Ua: NEGATIVE
Nitrite: NEGATIVE
Protein, ur: NEGATIVE
Specific Gravity, Urine: 1.018 (ref 1.001–1.035)
pH: 5.5 (ref 5.0–8.0)

## 2021-04-29 LAB — MICROALBUMIN / CREATININE URINE RATIO
Creatinine, Urine: 150 mg/dL (ref 20–275)
Microalb Creat Ratio: 5 mcg/mg creat (ref <30)
Microalb, Ur: 0.8 mg/dL

## 2021-04-29 NOTE — Telephone Encounter (Signed)
BMET and Urine Sodium has been ordered for future labs.

## 2021-05-04 ENCOUNTER — Telehealth: Payer: Self-pay

## 2021-05-04 NOTE — Telephone Encounter (Signed)
-----   Message from Delorise Jackson, MD sent at 05/03/2021  9:21 AM EST ----- Urine normal; urine protein normal  Lipid normal ldl at goal <70 Kidney function elevated and reduced 35  -avoid nsaids make sure drinking water 55-64 ounces daily  -arianna order repeat bmet, urine sodium in 2 weeks and schedule  A1C 7.2  Blood cts normal Vitamin D normal  Thyroid lab normal

## 2021-05-12 ENCOUNTER — Telehealth: Payer: Self-pay | Admitting: *Deleted

## 2021-05-12 NOTE — Telephone Encounter (Signed)
Please place future orders for lab appt.  

## 2021-05-17 ENCOUNTER — Other Ambulatory Visit: Payer: Self-pay | Admitting: Internal Medicine

## 2021-05-17 ENCOUNTER — Encounter: Payer: Self-pay | Admitting: Internal Medicine

## 2021-05-17 DIAGNOSIS — K449 Diaphragmatic hernia without obstruction or gangrene: Secondary | ICD-10-CM

## 2021-05-17 DIAGNOSIS — K8020 Calculus of gallbladder without cholecystitis without obstruction: Secondary | ICD-10-CM

## 2021-05-17 DIAGNOSIS — Z9989 Dependence on other enabling machines and devices: Secondary | ICD-10-CM | POA: Insufficient documentation

## 2021-05-17 DIAGNOSIS — N179 Acute kidney failure, unspecified: Secondary | ICD-10-CM

## 2021-05-17 DIAGNOSIS — I2510 Atherosclerotic heart disease of native coronary artery without angina pectoris: Secondary | ICD-10-CM

## 2021-05-17 DIAGNOSIS — M21371 Foot drop, right foot: Secondary | ICD-10-CM

## 2021-05-17 DIAGNOSIS — R7989 Other specified abnormal findings of blood chemistry: Secondary | ICD-10-CM

## 2021-05-17 DIAGNOSIS — B351 Tinea unguium: Secondary | ICD-10-CM

## 2021-05-17 DIAGNOSIS — I700 Atherosclerosis of aorta: Secondary | ICD-10-CM | POA: Insufficient documentation

## 2021-05-17 DIAGNOSIS — M109 Gout, unspecified: Secondary | ICD-10-CM

## 2021-05-17 HISTORY — DX: Diaphragmatic hernia without obstruction or gangrene: K44.9

## 2021-05-17 HISTORY — DX: Foot drop, right foot: M21.371

## 2021-05-17 HISTORY — DX: Calculus of gallbladder without cholecystitis without obstruction: K80.20

## 2021-05-17 HISTORY — DX: Tinea unguium: B35.1

## 2021-05-17 HISTORY — DX: Atherosclerotic heart disease of native coronary artery without angina pectoris: I25.10

## 2021-05-17 HISTORY — DX: Gout, unspecified: M10.9

## 2021-05-17 NOTE — Telephone Encounter (Signed)
Orders in 

## 2021-05-18 ENCOUNTER — Other Ambulatory Visit: Payer: Self-pay

## 2021-05-18 ENCOUNTER — Other Ambulatory Visit (INDEPENDENT_AMBULATORY_CARE_PROVIDER_SITE_OTHER): Payer: Medicare PPO

## 2021-05-18 DIAGNOSIS — N179 Acute kidney failure, unspecified: Secondary | ICD-10-CM

## 2021-05-18 DIAGNOSIS — R7989 Other specified abnormal findings of blood chemistry: Secondary | ICD-10-CM

## 2021-05-18 LAB — BASIC METABOLIC PANEL
BUN: 22 mg/dL (ref 6–23)
CO2: 26 mEq/L (ref 19–32)
Calcium: 9.5 mg/dL (ref 8.4–10.5)
Chloride: 102 mEq/L (ref 96–112)
Creatinine, Ser: 1.39 mg/dL — ABNORMAL HIGH (ref 0.40–1.20)
GFR: 37.06 mL/min — ABNORMAL LOW (ref >60.00)
Glucose, Bld: 74 mg/dL (ref 70–99)
Potassium: 4.3 mEq/L (ref 3.5–5.1)
Sodium: 139 mEq/L (ref 135–145)

## 2021-05-19 ENCOUNTER — Ambulatory Visit (HOSPITAL_COMMUNITY)
Admission: RE | Admit: 2021-05-19 | Discharge: 2021-05-19 | Disposition: A | Discharge status: Home / Self Care | Payer: Medicare PPO | Source: Ambulatory Visit | Attending: Surgery | Admitting: Surgery

## 2021-05-19 ENCOUNTER — Ambulatory Visit (INDEPENDENT_AMBULATORY_CARE_PROVIDER_SITE_OTHER)
Admission: RE | Admit: 2021-05-19 | Discharge: 2021-05-19 | Disposition: A | Discharge status: Home / Self Care | Payer: Medicare PPO | Source: Ambulatory Visit | Attending: Surgery | Admitting: Surgery

## 2021-05-19 ENCOUNTER — Telehealth: Payer: Self-pay

## 2021-05-19 DIAGNOSIS — I739 Peripheral vascular disease, unspecified: Secondary | ICD-10-CM | POA: Diagnosis present

## 2021-05-19 NOTE — Telephone Encounter (Signed)
Patient's daughter called with concerns about the patient's SOB.  Wants to know if she should go to the ED or wait until she sees the Dr. In our office on Monday Dec 5th.  I advised the daughter that since she is not an established patient we can't really advise her but we do suggest that she call her PCP after hours line.  If she is concerned about SOB then we do suggest she take her mom to the Ed.  Daughter verbalized understanding.

## 2021-05-20 LAB — PROTEIN ELECTROPHORESIS, URINE REFLEX
Albumin ELP, Urine: 100 %
Alpha-1-Globulin, U: 0 %
Alpha-2-Globulin, U: 0 %
Beta Globulin, U: 0 %
Gamma Globulin, U: 0 %
Protein, Ur: 7.1 mg/dL

## 2021-05-20 LAB — MICROALBUMIN / CREATININE URINE RATIO
Creatinine, Urine: 102 mg/dL
Microalb/Creat Ratio: 10 mg/g creat (ref 0–29)
Microalbumin, Urine: 10.4 ug/mL

## 2021-05-20 LAB — SODIUM, URINE, RANDOM: Sodium, Ur: 121 mmol/L

## 2021-05-21 ENCOUNTER — Telehealth: Payer: Self-pay | Admitting: Internal Medicine

## 2021-05-21 NOTE — Telephone Encounter (Signed)
Patient called about getting lab results.

## 2021-05-22 LAB — ANTI-NUCLEAR AB-TITER (ANA TITER)
ANA TITER: 1:320 {titer} — ABNORMAL HIGH
ANA Titer 1: 1:320 {titer} — ABNORMAL HIGH

## 2021-05-22 LAB — PROTEIN ELECTROPHORESIS, SERUM
Albumin ELP: 3.8 g/dL (ref 3.8–4.8)
Alpha 1: 0.4 g/dL — ABNORMAL HIGH (ref 0.2–0.3)
Alpha 2: 1.2 g/dL — ABNORMAL HIGH (ref 0.5–0.9)
Beta 2: 0.5 g/dL (ref 0.2–0.5)
Beta Globulin: 0.4 g/dL (ref 0.4–0.6)
Gamma Globulin: 1.5 g/dL (ref 0.8–1.7)
Total Protein: 7.8 g/dL (ref 6.1–8.1)

## 2021-05-22 LAB — ANA: Anti Nuclear Antibody (ANA): POSITIVE — AB

## 2021-05-24 ENCOUNTER — Encounter (HOSPITAL_COMMUNITY): Payer: Medicare PPO

## 2021-05-24 ENCOUNTER — Encounter: Payer: Self-pay | Admitting: Internal Medicine

## 2021-05-24 ENCOUNTER — Encounter: Payer: Self-pay | Admitting: Surgery

## 2021-05-24 ENCOUNTER — Ambulatory Visit: Payer: Medicare PPO | Admitting: Surgery

## 2021-05-24 ENCOUNTER — Other Ambulatory Visit: Payer: Self-pay

## 2021-05-24 VITALS — BP 112/69 | HR 78 | Temp 98.1°F | Resp 20 | Ht 62.0 in | Wt 169.0 lb

## 2021-05-24 DIAGNOSIS — R768 Other specified abnormal immunological findings in serum: Secondary | ICD-10-CM | POA: Insufficient documentation

## 2021-05-24 DIAGNOSIS — I70213 Atherosclerosis of native arteries of extremities with intermittent claudication, bilateral legs: Secondary | ICD-10-CM | POA: Diagnosis not present

## 2021-05-24 DIAGNOSIS — R7689 Other specified abnormal immunological findings in serum: Secondary | ICD-10-CM | POA: Insufficient documentation

## 2021-05-24 HISTORY — DX: Other specified abnormal immunological findings in serum: R76.8

## 2021-05-24 NOTE — Telephone Encounter (Signed)
Patient called office, no one has called her back about her lab results.

## 2021-05-24 NOTE — H&P (View-Only) (Signed)
[facility]  Patient name: Natalie Watts MRN: [id number] DOB: 1945-08-23 Sex: female   REQUESTING PROVIDER:    Dr. Aundra Dubin   REASON FOR CONSULT:    PAD  HISTORY OF PRESENT ILLNESS:   Natalie Watts is a 75 y.o. female, who is here to establish vascular care.  In December 2020 in [place] she underwent placement of bilateral superficial femoral artery stenting.  She tells me that she did not have any symptoms of claudication at that time, but that her noninvasive imaging suggested significant stenosis.  She then moved to [place] and in March 2021 she underwent angioplasty of in-stent stenosis on the left.  She had subsequently gone to occlude the stents in her left leg.  She does not endorse rest pain in her left leg.  She does not have any open wounds.  She does have short distance claudication.  Patient has foot drop in her right leg secondary to a low back issue.  She has a history of DVT on the left in 2014.  She suffers from chronic stage III renal insufficiency.  She is a diabetic.  She is on a statin for hypercholesterolemia.  She is medically managed for hypertension with an ARB.  She is on Plavix for antiplatelet therapy.  She is a former smoker.  PAST MEDICAL HISTORY    Past Medical History:  Diagnosis Date   Arthritis    lumbar, cervical, cervical/lumbar spondylosis   Blood clotting disorder (HCC)    CAD (coronary artery disease)    Cardiac murmur    Carotid bruit    US carotid had 09/03/14 [facility]    Carpal tunnel syndrome    Cervical radiculopathy    improved since surgery 2016    Chicken pox    CKD (chronic kidney disease) stage 3, GFR 30-59 ml/min ([place])    per Dr Vassie Moselle Su notes [place] noted 12/14/20   Colon polyp    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 Loann Quill [facility]    COVID 19+    mid 11/2020   Degenerative disk disease    Diabetes ([place])     DVT (deep venous thrombosis) ([place])    DVT, lower extremity ([place])    left, 05/2013 on Xarelto x 1 month etiology unkown    GERD (gastroesophageal reflux disease)    Gout    H/O myocardial perfusion scan    06/08/20 low risk   Hammer toes, bilateral    HSV-2 infection    Hyperlipidemia    Hypertension    Insomnia    Intertrigo    Lumbar radiculopathy    improved after steroid inj and PT 10/21/20 had right L4/5 L5-S1 transforaminal epidural steroid injections   Onychomycosis    OSA on CPAP    dx in 2020 after hip surgery CPAP 9-13 cm H20 supplies [employer] PSG 03/18/2019 severe OSA AHI 42.1/hr titration 03/27/2019 rec APAP 9-13 cm H20   PAD (peripheral artery disease) ([place])    1 x right, 3 x left in 2020-2022 needs bypass on leg had CTA with run off 11/2020 in [place]   PAD (peripheral artery disease) (HCC)    with stents   Peripheral vascular disease ([place])    Right foot drop    since 2020   Subdeltoid bursitis    Tinea pedis    Vitamin D deficiency      FAMILY HISTORY   Family History  Problem Relation Age of Onset  Hypertension Mother    Arthritis Mother    Cancer Father        colon cancer dx'ed died age 63   Hypertension Sister    Arthritis Sister    Cancer Sister        died in 3s brain tumor h/o lung cancer smoker died 2015-08-21    Early death Sister    Breast cancer Maternal Grandmother    Cancer Maternal Grandmother        breast dx older age    Hypertension Maternal Grandmother    Breast cancer Other        maternal neice   Cancer Other        died in 42s    Hypertension Daughter    Hypertension Daughter     SOCIAL HISTORY:   Social History   Socioeconomic History   Marital status: Divorced    Spouse name: Not on file   Number of children: Not on file   Years of education: Not on file   Highest education level: Not on file  Occupational History   Not on file  Tobacco Use   Smoking status: Former    Packs/day: 1.00    Years: 45.00    Pack years: 45.00     Types: Cigarettes    Quit date: 12/18/2012    Years since quitting: 8.4   Smokeless tobacco: Never  Vaping Use   Vaping Use: Never used  Substance and Sexual Activity   Alcohol use: No   Drug use: Yes    Frequency: 3.0 times per week   Sexual activity: Not on file  Other Topics Concern   Not on file  Social History Narrative   Divorced    2 daughters [name] comes to most appts and is Chief Executive Officer    -lives with daughter [name]    Used to work for [employer] now [employer]    Former smoker age 19 to age 75/2012 quit for sure in August 20, 2012 max 1/2 ppd FH lung cancer sister also smoker    Social Determinants of Radio broadcast assistant Strain: Not on file  Food Insecurity: Not on file  Transportation Needs: Not on file  Physical Activity: Not on file  Stress: Not on file  Social Connections: Not on file  Intimate Partner Violence: Not on file    ALLERGIES:    Allergies  Allergen Reactions   Lisinopril Anaphylaxis and Swelling     Tongue swelling    Sulfamethoxazole-Trimethoprim Other (See Comments)    Other reaction(s): Other (See Comments)   Farxiga [Dapagliflozin]     diarrhea   Glipizide     Diarrhea    Metformin And Related     GI sx's diarrhea   Penicillins     Childhood   Tramadol Nausea Only and Nausea And Vomiting    CURRENT MEDICATIONS:    Current Outpatient Medications  Medication Sig Dispense Refill   acetaminophen (TYLENOL) 650 MG CR tablet Take 650 mg by mouth every 8 (eight) hours as needed.     amLODipine (NORVASC) 5 MG tablet Take 5 mg by mouth daily.     atenolol-chlorthalidone (TENORETIC) 50-25 MG tablet Take 0.5 tablets by mouth daily.     B-D UF III MINI PEN NEEDLES 31G X 5 MM MISC Inject 1 each into the skin daily.     Blood Glucose Monitoring Suppl (ONE TOUCH ULTRA 2) w/Device KIT Used to check blood sugars daily. 1 each 0   Cholecalciferol (VITAMIN  D3) 50 MCG (2000 UT) capsule Take 1 capsule by mouth daily.     clopidogrel (PLAVIX)  75 MG tablet Take 75 mg by mouth daily.     Dulaglutide (TRULICITY) 5.18 AC/1.6SA SOPN Inject 0.75 mg into the skin once a week. 3 mL 0   ezetimibe (ZETIA) 10 MG tablet Take 10 mg by mouth daily.     glucose blood test strip Use 3 times daily before meals. 300 each 4   hydrocortisone 2.5 % cream Apply 1 application topically 2 (two) times daily as needed.     LANTUS SOLOSTAR 100 UNIT/ML Solostar Pen Inject 40 Units into the skin daily. 15 mL 11   losartan (COZAAR) 25 MG tablet Take 1 tablet (25 mg total) by mouth daily. 90 tablet 3   omeprazole (PRILOSEC) 40 MG capsule Take 40 mg by mouth daily.     ONETOUCH DELICA LANCETS FINE MISC 1 each by Does not apply route 4 (four) times daily as needed. 100 each 3   Powders (GOLD BOND BABY POWDER EX) Apply 1 application topically as needed.     pravastatin (PRAVACHOL) 40 MG tablet Take 1 tablet (40 mg total) by mouth daily. At night 90 tablet 3   sitaGLIPtin (JANUVIA) 100 MG tablet Take 1 tablet (100 mg total) by mouth daily. 90 tablet 1   XARELTO 2.5 MG TABS tablet Take 2.5 mg by mouth 2 (two) times daily.     aspirin EC 81 MG tablet Take 81 mg by mouth daily.  (Patient not taking: Reported on 04/21/2021)     EPIPEN 2-PAK 0.3 MG/0.3ML SOAJ injection  (Patient not taking: Reported on 05/24/2021)  0   Lancets (ONETOUCH DELICA PLUS [id number]) MISC 1 Device by Does not apply route 2 (two) times daily. E11.9 200 each 3   No current facility-administered medications for this visit.    REVIEW OF SYSTEMS:   _0  denotes positive finding, _1  denotes negative finding Cardiac  Comments:  Chest pain or chest pressure:    Shortness of breath upon exertion:    Short of breath when lying flat:    Irregular heart rhythm:        Vascular    Pain in calf, thigh, or hip brought on by ambulation: x   Pain in feet at night that wakes you up from your sleep:     Blood clot in your veins:    Leg swelling:         Pulmonary    Oxygen at home:    Productive cough:      Wheezing:         Neurologic    Sudden weakness in arms or legs:     Sudden numbness in arms or legs:     Sudden onset of difficulty speaking or slurred speech:    Temporary loss of vision in one eye:     Problems with dizziness:         Gastrointestinal    Blood in stool:      Vomited blood:         Genitourinary    Burning when urinating:     Blood in urine:        Psychiatric    Major depression:         Hematologic    Bleeding problems:    Problems with blood clotting too easily:        Skin    Rashes or ulcers:  Constitutional    Fever or chills:     PHYSICAL EXAM:   Vitals:   05/24/21 1130  BP: 112/69  Pulse: 78  Resp: 20  Temp: 98.1 F (36.7 C)  SpO2: 94%  Weight: 169 lb (76.7 kg)  Height: _0  (1.575 m)    GENERAL: The patient is a well-nourished female, in no acute distress. The vital signs are documented above. CARDIAC: There is a regular rate and rhythm.  VASCULAR: Nonpalpable pedal pulses PULMONARY: Nonlabored respirations ABDOMEN: Soft and non-tender with normal pitched bowel sounds.  MUSCULOSKELETAL: There are no major deformities or cyanosis. NEUROLOGIC: No focal weakness or paresthesias are detected. SKIN: There are no ulcers or rashes noted. PSYCHIATRIC: The patient has a normal affect.  STUDIES:   I have reviewed the following: +-------+-----------+-----------+------------+------------+  ABI/TBIToday's ABIToday's TBIPrevious ABIPrevious TBI  +-------+-----------+-----------+------------+------------+  Right  0.97       0.54       1.07        0.56          +-------+-----------+-----------+------------+------------+  Left   0.66       0.32       0.40        0.37          +-------+-----------+-----------+------------+------------+  Right toe pressure: 80 Left toe pressure: 48  Right: 50-74% stenosis noted in the superficial femoral artery.   Left: Total occlusion noted in the superficial femoral artery  with distal  reconstitution of flow. Total occlusion noted in the posterior tibial  artery with collateral flow noted. Left superficial femoral artery stent  appears occluded.   ASSESSMENT and PLAN   The patient was here today with her daughter.  She has in-stent stenosis on the right which I discussed that I would recommend proceeding with angiography to intervene.  This would be through a left femoral approach.  I would also evaluate the left leg so as to determine the next options which would likely be surgical for her.  I told her I would not recommend surgery on the left leg unless she develops rest pain or nonhealing wound, as this sounds like it will be a below-knee bypass.  She is going to get this scheduled in the next several weeks.   Leia Alf, MD, FACS [facility] [phone] Pager [phone]

## 2021-05-24 NOTE — Progress Notes (Signed)
[facility] [place]  Patient name: Natalie Watts MRN: [id number] DOB: 1945-08-23 Sex: female   REQUESTING PROVIDER:    Dr. Aundra Dubin   REASON FOR CONSULT:    PAD  HISTORY OF PRESENT ILLNESS:   Natalie Watts is a 75 y.o. female, who is here to establish vascular care.  In December 2020 in [place] she underwent placement of bilateral superficial femoral artery stenting.  She tells me that she did not have any symptoms of claudication at that time, but that her noninvasive imaging suggested significant stenosis.  She then moved to [place] and in March 2021 she underwent angioplasty of in-stent stenosis on the left.  She had subsequently gone to occlude the stents in her left leg.  She does not endorse rest pain in her left leg.  She does not have any open wounds.  She does have short distance claudication.  Patient has foot drop in her right leg secondary to a low back issue.  She has a history of DVT on the left in 2014.  She suffers from chronic stage III renal insufficiency.  She is a diabetic.  She is on a statin for hypercholesterolemia.  She is medically managed for hypertension with an ARB.  She is on Plavix for antiplatelet therapy.  She is a former smoker.  PAST MEDICAL HISTORY    Past Medical History:  Diagnosis Date   Arthritis    lumbar, cervical, cervical/lumbar spondylosis   Blood clotting disorder (HCC)    CAD (coronary artery disease)    Cardiac murmur    Carotid bruit    US carotid had 09/03/14 [facility]    Carpal tunnel syndrome    Cervical radiculopathy    improved since surgery 2016    Chicken pox    CKD (chronic kidney disease) stage 3, GFR 30-59 ml/min ([place])    per Dr Vassie Moselle Su notes [place] noted 12/14/20   Colon polyp    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 Loann Quill [facility]    COVID 19+    mid 11/2020   Degenerative disk disease    Diabetes ([place])     DVT (deep venous thrombosis) ([place])    DVT, lower extremity ([place])    left, 05/2013 on Xarelto x 1 month etiology unkown    GERD (gastroesophageal reflux disease)    Gout    H/O myocardial perfusion scan    06/08/20 low risk   Hammer toes, bilateral    HSV-2 infection    Hyperlipidemia    Hypertension    Insomnia    Intertrigo    Lumbar radiculopathy    improved after steroid inj and PT 10/21/20 had right L4/5 L5-S1 transforaminal epidural steroid injections   Onychomycosis    OSA on CPAP    dx in 2020 after hip surgery CPAP 9-13 cm H20 supplies [employer] PSG 03/18/2019 severe OSA AHI 42.1/hr titration 03/27/2019 rec APAP 9-13 cm H20   PAD (peripheral artery disease) ([place])    1 x right, 3 x left in 2020-2022 needs bypass on leg had CTA with run off 11/2020 in [place]   PAD (peripheral artery disease) (HCC)    with stents   Peripheral vascular disease ([place])    Right foot drop    since 2020   Subdeltoid bursitis    Tinea pedis    Vitamin D deficiency      FAMILY HISTORY   Family History  Problem Relation Age of Onset  Hypertension Mother    Arthritis Mother    Cancer Father        colon cancer dx'ed died age 63   Hypertension Sister    Arthritis Sister    Cancer Sister        died in 3s brain tumor h/o lung cancer smoker died 2015-08-21    Early death Sister    Breast cancer Maternal Grandmother    Cancer Maternal Grandmother        breast dx older age    Hypertension Maternal Grandmother    Breast cancer Other        maternal neice   Cancer Other        died in 42s    Hypertension Daughter    Hypertension Daughter     SOCIAL HISTORY:   Social History   Socioeconomic History   Marital status: Divorced    Spouse name: Not on file   Number of children: Not on file   Years of education: Not on file   Highest education level: Not on file  Occupational History   Not on file  Tobacco Use   Smoking status: Former    Packs/day: 1.00    Years: 45.00    Pack years: 45.00     Types: Cigarettes    Quit date: 12/18/2012    Years since quitting: 8.4   Smokeless tobacco: Never  Vaping Use   Vaping Use: Never used  Substance and Sexual Activity   Alcohol use: No   Drug use: Yes    Frequency: 3.0 times per week   Sexual activity: Not on file  Other Topics Concern   Not on file  Social History Narrative   Divorced    2 daughters [name] comes to most appts and is Chief Executive Officer    -lives with daughter [name]    Used to work for [employer] now [employer]    Former smoker age 19 to age 75/2012 quit for sure in August 20, 2012 max 1/2 ppd FH lung cancer sister also smoker    Social Determinants of Radio broadcast assistant Strain: Not on file  Food Insecurity: Not on file  Transportation Needs: Not on file  Physical Activity: Not on file  Stress: Not on file  Social Connections: Not on file  Intimate Partner Violence: Not on file    ALLERGIES:    Allergies  Allergen Reactions   Lisinopril Anaphylaxis and Swelling     Tongue swelling    Sulfamethoxazole-Trimethoprim Other (See Comments)    Other reaction(s): Other (See Comments)   Farxiga [Dapagliflozin]     diarrhea   Glipizide     Diarrhea    Metformin And Related     GI sx's diarrhea   Penicillins     Childhood   Tramadol Nausea Only and Nausea And Vomiting    CURRENT MEDICATIONS:    Current Outpatient Medications  Medication Sig Dispense Refill   acetaminophen (TYLENOL) 650 MG CR tablet Take 650 mg by mouth every 8 (eight) hours as needed.     amLODipine (NORVASC) 5 MG tablet Take 5 mg by mouth daily.     atenolol-chlorthalidone (TENORETIC) 50-25 MG tablet Take 0.5 tablets by mouth daily.     B-D UF III MINI PEN NEEDLES 31G X 5 MM MISC Inject 1 each into the skin daily.     Blood Glucose Monitoring Suppl (ONE TOUCH ULTRA 2) w/Device KIT Used to check blood sugars daily. 1 each 0   Cholecalciferol (VITAMIN  D3) 50 MCG (2000 UT) capsule Take 1 capsule by mouth daily.     clopidogrel (PLAVIX)  75 MG tablet Take 75 mg by mouth daily.     Dulaglutide (TRULICITY) 5.18 AC/1.6SA SOPN Inject 0.75 mg into the skin once a week. 3 mL 0   ezetimibe (ZETIA) 10 MG tablet Take 10 mg by mouth daily.     glucose blood test strip Use 3 times daily before meals. 300 each 4   hydrocortisone 2.5 % cream Apply 1 application topically 2 (two) times daily as needed.     LANTUS SOLOSTAR 100 UNIT/ML Solostar Pen Inject 40 Units into the skin daily. 15 mL 11   losartan (COZAAR) 25 MG tablet Take 1 tablet (25 mg total) by mouth daily. 90 tablet 3   omeprazole (PRILOSEC) 40 MG capsule Take 40 mg by mouth daily.     ONETOUCH DELICA LANCETS FINE MISC 1 each by Does not apply route 4 (four) times daily as needed. 100 each 3   Powders (GOLD BOND BABY POWDER EX) Apply 1 application topically as needed.     pravastatin (PRAVACHOL) 40 MG tablet Take 1 tablet (40 mg total) by mouth daily. At night 90 tablet 3   sitaGLIPtin (JANUVIA) 100 MG tablet Take 1 tablet (100 mg total) by mouth daily. 90 tablet 1   XARELTO 2.5 MG TABS tablet Take 2.5 mg by mouth 2 (two) times daily.     aspirin EC 81 MG tablet Take 81 mg by mouth daily.  (Patient not taking: Reported on 04/21/2021)     EPIPEN 2-PAK 0.3 MG/0.3ML SOAJ injection  (Patient not taking: Reported on 05/24/2021)  0   Lancets (ONETOUCH DELICA PLUS [id number]) MISC 1 Device by Does not apply route 2 (two) times daily. E11.9 200 each 3   No current facility-administered medications for this visit.    REVIEW OF SYSTEMS:   _0  denotes positive finding, _1  denotes negative finding Cardiac  Comments:  Chest pain or chest pressure:    Shortness of breath upon exertion:    Short of breath when lying flat:    Irregular heart rhythm:        Vascular    Pain in calf, thigh, or hip brought on by ambulation: x   Pain in feet at night that wakes you up from your sleep:     Blood clot in your veins:    Leg swelling:         Pulmonary    Oxygen at home:    Productive cough:      Wheezing:         Neurologic    Sudden weakness in arms or legs:     Sudden numbness in arms or legs:     Sudden onset of difficulty speaking or slurred speech:    Temporary loss of vision in one eye:     Problems with dizziness:         Gastrointestinal    Blood in stool:      Vomited blood:         Genitourinary    Burning when urinating:     Blood in urine:        Psychiatric    Major depression:         Hematologic    Bleeding problems:    Problems with blood clotting too easily:        Skin    Rashes or ulcers:  Constitutional    Fever or chills:     PHYSICAL EXAM:   Vitals:   05/24/21 1130  BP: 112/69  Pulse: 78  Resp: 20  Temp: 98.1 F (36.7 C)  SpO2: 94%  Weight: 169 lb (76.7 kg)  Height: _0  (1.575 m)    GENERAL: The patient is a well-nourished female, in no acute distress. The vital signs are documented above. CARDIAC: There is a regular rate and rhythm.  VASCULAR: Nonpalpable pedal pulses PULMONARY: Nonlabored respirations ABDOMEN: Soft and non-tender with normal pitched bowel sounds.  MUSCULOSKELETAL: There are no major deformities or cyanosis. NEUROLOGIC: No focal weakness or paresthesias are detected. SKIN: There are no ulcers or rashes noted. PSYCHIATRIC: The patient has a normal affect.  STUDIES:   I have reviewed the following: +-------+-----------+-----------+------------+------------+  ABI/TBIToday's ABIToday's TBIPrevious ABIPrevious TBI  +-------+-----------+-----------+------------+------------+  Right  0.97       0.54       1.07        0.56          +-------+-----------+-----------+------------+------------+  Left   0.66       0.32       0.40        0.37          +-------+-----------+-----------+------------+------------+  Right toe pressure: 80 Left toe pressure: 48  Right: 50-74% stenosis noted in the superficial femoral artery.   Left: Total occlusion noted in the superficial femoral artery  with distal  reconstitution of flow. Total occlusion noted in the posterior tibial  artery with collateral flow noted. Left superficial femoral artery stent  appears occluded.   ASSESSMENT and PLAN   The patient was here today with her daughter.  She has in-stent stenosis on the right which I discussed that I would recommend proceeding with angiography to intervene.  This would be through a left femoral approach.  I would also evaluate the left leg so as to determine the next options which would likely be surgical for her.  I told her I would not recommend surgery on the left leg unless she develops rest pain or nonhealing wound, as this sounds like it will be a below-knee bypass.  She is going to get this scheduled in the next several weeks.   Leia Alf, MD, FACS [facility] [phone] Pager [phone]

## 2021-05-25 ENCOUNTER — Encounter: Payer: Self-pay | Admitting: Internal Medicine

## 2021-05-25 DIAGNOSIS — H409 Unspecified glaucoma: Secondary | ICD-10-CM | POA: Insufficient documentation

## 2021-05-25 DIAGNOSIS — G8929 Other chronic pain: Secondary | ICD-10-CM

## 2021-05-25 DIAGNOSIS — N179 Acute kidney failure, unspecified: Secondary | ICD-10-CM | POA: Insufficient documentation

## 2021-05-25 DIAGNOSIS — D126 Benign neoplasm of colon, unspecified: Secondary | ICD-10-CM

## 2021-05-25 DIAGNOSIS — I152 Hypertension secondary to endocrine disorders: Secondary | ICD-10-CM | POA: Insufficient documentation

## 2021-05-25 HISTORY — DX: Benign neoplasm of colon, unspecified: D12.6

## 2021-05-25 HISTORY — DX: Other chronic pain: G89.29

## 2021-05-25 HISTORY — DX: Acute kidney failure, unspecified: N17.9

## 2021-05-25 NOTE — Telephone Encounter (Signed)
Patient coming in as she would like to go over her lab results in more detail with the provider. Appointment scheduled

## 2021-05-27 ENCOUNTER — Ambulatory Visit (INDEPENDENT_AMBULATORY_CARE_PROVIDER_SITE_OTHER): Payer: Medicare PPO | Admitting: Internal Medicine

## 2021-05-27 ENCOUNTER — Other Ambulatory Visit: Payer: Self-pay

## 2021-05-27 ENCOUNTER — Encounter: Payer: Self-pay | Admitting: Internal Medicine

## 2021-05-27 VITALS — Ht 62.0 in | Wt 169.0 lb

## 2021-05-27 DIAGNOSIS — E1159 Type 2 diabetes mellitus with other circulatory complications: Secondary | ICD-10-CM

## 2021-05-27 DIAGNOSIS — R768 Other specified abnormal immunological findings in serum: Secondary | ICD-10-CM

## 2021-05-27 DIAGNOSIS — I517 Cardiomegaly: Secondary | ICD-10-CM

## 2021-05-27 DIAGNOSIS — M7552 Bursitis of left shoulder: Secondary | ICD-10-CM

## 2021-05-27 DIAGNOSIS — I739 Peripheral vascular disease, unspecified: Secondary | ICD-10-CM | POA: Diagnosis not present

## 2021-05-27 DIAGNOSIS — N1832 Chronic kidney disease, stage 3b: Secondary | ICD-10-CM

## 2021-05-27 DIAGNOSIS — G8929 Other chronic pain: Secondary | ICD-10-CM

## 2021-05-27 DIAGNOSIS — M25512 Pain in left shoulder: Secondary | ICD-10-CM

## 2021-05-27 DIAGNOSIS — I700 Atherosclerosis of aorta: Secondary | ICD-10-CM

## 2021-05-27 DIAGNOSIS — I152 Hypertension secondary to endocrine disorders: Secondary | ICD-10-CM

## 2021-05-27 HISTORY — DX: Bursitis of left shoulder: M75.52

## 2021-05-27 HISTORY — DX: Cardiomegaly: I51.7

## 2021-05-27 MED ORDER — LOSARTAN POTASSIUM 50 MG PO TABS: 50.0000 mg | ORAL_TABLET | Freq: Every day | ORAL | 3 refills | Status: DC

## 2021-05-27 MED ORDER — NEBIVOLOL HCL 5 MG PO TABS: 5.0000 mg | ORAL_TABLET | Freq: Every day | ORAL | 3 refills | Status: DC

## 2021-05-27 NOTE — Progress Notes (Signed)
Telephone Note  I connected with Natalie Watts with daughter [name]  on 05/27/21 at  8:30 AM EST by telephone and verified that I am speaking with the correct person using two identifiers.  Location patient: home, [place] Location provider:work or home office Persons participating in the virtual visit: patient, provider  I discussed the limitations of evaluation and management by telemedicine and the availability of in person appointments. The patient expressed understanding and agreed to proceed.   HPI:  Acute telemedicine visit for : Review labs 05/18/21  ANA + titer   Latest Reference Range & Units 05/18/21 10:36  Anti Nuclear Antibody (ANA) NEGATIVE  POSITIVE !  ANA Pattern 1  Nuclear, Homogeneous !   3. Elevated Cr and CKD 3 B-will refer to renal in [place] Cr 1.39, GFR 37.06 Alpha 1 protein 0.4 and 2 protein 1.2   4. PAD Tuesday planning revision right leg and 100 % PAD and needs bypass but will wait for sx's  5. H/o LVH, AA, HTN with DM-wants cardiology referral    ROS: See pertinent positives and negatives per HPI.  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 Loann Quill [facility]    COVID 19+    mid 11/2020   Degenerative disk disease    Diabetes ([place])    DVT (deep venous thrombosis) ([place])    DVT, lower extremity ([place])    left, 05/2013 on Xarelto x 1 month etiology unkown    GERD (gastroesophageal reflux disease)    Gout    H/O myocardial perfusion scan    06/08/20 low  risk   Hammer toes, bilateral    HSV-2 infection    Hyperlipidemia    Hypertension    Insomnia    Intertrigo    Lumbar radiculopathy    improved after steroid inj and PT 10/21/20 had right L4/5 L5-S1 transforaminal epidural steroid injections   Onychomycosis    OSA on CPAP    dx in 2020 after hip surgery CPAP 9-13 cm H20 supplies [employer] PSG 03/18/2019 severe OSA AHI 42.1/hr titration 03/27/2019 rec APAP 9-13 cm H20   PAD (peripheral artery disease) ([place])    1 x right, 3 x left in 2020-2022 needs bypass on leg had CTA with run off 11/2020 in [place]   PAD (peripheral artery disease) (HCC)    with stents   Peripheral vascular disease ([place])    Right foot drop    since 2020   Subdeltoid bursitis    Tinea pedis    Vitamin D deficiency     Past Surgical History:  Procedure Laterality Date   BACK SURGERY     cervical laminoplasty C3-C7 Rex Dr. 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 Edwena Bunde [place] 12 mm d colon polyp, grade  1 IH, moderate diverticulosis tubular   ESOPHAGOGASTRODUODENOSCOPY  05/01/2020   erosive gastritis Dr. Langley [place]   right total hip Right    03/05/2019 or 02/26/2020   TUBAL LIGATION     UPPER GI ENDOSCOPY     05/01/20   VASCULAR SURGERY     08/19/19   VASCULAR SURGERY     07/05/19   VASCULAR SURGERY     05/21/2019 angioplasty since 05/2019, 06/2019 and 08/2019 had 2 left leg and 1 on right leg     Current Outpatient Medications:    acetaminophen (TYLENOL) 650 MG CR tablet, Take 650 mg by mouth every 8 (eight) hours as needed., Disp: , Rfl:    amLODipine (NORVASC) 5 MG tablet, Take 5 mg by mouth daily., Disp: , Rfl:    B-D UF III MINI PEN NEEDLES 31G X 5 MM MISC, Inject 1 each into the skin daily., Disp: , Rfl:    Blood Glucose Monitoring Suppl (ONE TOUCH ULTRA 2) w/Device KIT, Used to check blood sugars daily., Disp: 1 each,  Rfl: 0   Cholecalciferol (VITAMIN D3) 50 MCG (2000 UT) capsule, Take 1 capsule by mouth daily., Disp: , Rfl:    clopidogrel (PLAVIX) 75 MG tablet, Take 75 mg by mouth daily., Disp: , Rfl:    Continuous Blood Gluc Sensor (FREESTYLE LIBRE 2 SENSOR) MISC, by Does not apply route every 14 (fourteen) days., Disp: , Rfl:    Dulaglutide (TRULICITY) 3.81 WE/9.9BZ SOPN, Inject 0.75 mg into the skin once a week., Disp: 3 mL, Rfl: 0   ezetimibe (ZETIA) 10 MG tablet, Take 10 mg by mouth daily., Disp: , Rfl:    glucose blood test strip, Use 3 times daily before meals., Disp: 300 each, Rfl: 4   LANTUS SOLOSTAR 100 UNIT/ML Solostar Pen, Inject 40 Units into the skin daily., Disp: 15 mL, Rfl: 11   nebivolol (BYSTOLIC) 5 MG tablet, Take 1 tablet (5 mg total) by mouth daily. Dc tenoretic, Disp: 90 tablet, Rfl: 3   omeprazole (PRILOSEC) 40 MG capsule, Take 40 mg by mouth daily., Disp: , Rfl:    ONETOUCH DELICA LANCETS FINE MISC, 1 each by Does not apply route 4 (four) times daily as needed., Disp: 100 each, Rfl: 3   pravastatin (PRAVACHOL) 40 MG tablet, Take 1 tablet (40 mg total) by mouth daily. At night, Disp: 90 tablet, Rfl: 3   sitaGLIPtin (JANUVIA) 100 MG tablet, Take 1 tablet (100 mg total) by mouth daily., Disp: 90 tablet, Rfl: 1   XARELTO 2.5 MG TABS tablet, Take 2.5 mg by mouth 2 (two) times daily., Disp: , Rfl:    aspirin EC 81 MG tablet, Take 81 mg by mouth daily.  (Patient not taking: Reported on 04/21/2021), Disp: , Rfl:    EPIPEN 2-PAK 0.3 MG/0.3ML SOAJ injection, , Disp: , Rfl: 0   losartan (COZAAR) 50 MG tablet, Take 1 tablet (50 mg total) by mouth daily., Disp: 90 tablet, Rfl: 3  EXAM:  VITALS per patient if applicable:  GENERAL: alert, oriented, appears well and in no acute distress  PSYCH/NEURO: pleasant and cooperative, no obvious depression or anxiety, speech and thought processing grossly intact  ASSESSMENT AND PLAN:  Discussed the following assessment and plan:  PAD (peripheral  artery disease) ([place]) /AA/HTN with DM 2 a1c 7.2/LVH- Plan: Ambulatory referral to Cardiology risk assess and w/u if needed   Stage 3b chronic kidney disease ([place]) - Plan: Ambulatory referral to Nephrology in [place]  Peripheral vascular disease ([place]) -  Plan: Ambulatory referral to Cardiology surgery 06/01/21 Dr. Trula Slade  Hypertension associated with diabetes [facility]) - Plan: Ambulatory referral to Cardiology, Ambulatory referral to Nephrology, losartan (COZAAR) 50 MG from 25 mg tablet, nebivolol (BYSTOLIC) 5 MG tablet Due to CKD 3 B stop tenoretic 1/2 28-00  Add bystolic 5  Monitor BP  Consider repeat BMET in <30 days   Chronic left shoulder pain - Plan: Ambulatory referral to Rheumatology Chronic bursitis of left shoulder - Plan: Ambulatory referral to Rheumatology ANA positive - Plan: Ambulatory referral to Rheumatology  Consider NS chronic back issues and neurology foot drop right in the future  Consider ortho left shoulder bursitis if rheuma appt does not help     -we discussed possible serious and likely etiologies, options for evaluation and workup, limitations of telemedicine visit vs in person visit, treatment, treatment risks and precautions. Pt is agreeable to treatment via telemedicine at this moment.    I discussed the assessment and treatment plan with the patient. The patient was provided an opportunity to ask questions and all were answered. The patient agreed with the plan and demonstrated an understanding of the instructions.    Time spent 20 minutes Delorise Jackson, MD

## 2021-05-28 ENCOUNTER — Ambulatory Visit: Payer: Medicare PPO | Admitting: Interventional Cardiology

## 2021-05-28 ENCOUNTER — Encounter: Payer: Self-pay | Admitting: Interventional Cardiology

## 2021-05-28 VITALS — BP 122/72 | HR 84 | Ht 62.0 in | Wt 168.6 lb

## 2021-05-28 DIAGNOSIS — I700 Atherosclerosis of aorta: Secondary | ICD-10-CM

## 2021-05-28 DIAGNOSIS — I739 Peripheral vascular disease, unspecified: Secondary | ICD-10-CM

## 2021-05-28 DIAGNOSIS — I517 Cardiomegaly: Secondary | ICD-10-CM

## 2021-05-28 DIAGNOSIS — E782 Mixed hyperlipidemia: Secondary | ICD-10-CM

## 2021-05-28 DIAGNOSIS — N1832 Chronic kidney disease, stage 3b: Secondary | ICD-10-CM

## 2021-05-28 DIAGNOSIS — I2510 Atherosclerotic heart disease of native coronary artery without angina pectoris: Secondary | ICD-10-CM | POA: Diagnosis not present

## 2021-05-28 DIAGNOSIS — R0602 Shortness of breath: Secondary | ICD-10-CM

## 2021-05-28 DIAGNOSIS — I10 Essential (primary) hypertension: Secondary | ICD-10-CM

## 2021-05-28 DIAGNOSIS — E1151 Type 2 diabetes mellitus with diabetic peripheral angiopathy without gangrene: Secondary | ICD-10-CM

## 2021-05-28 NOTE — Patient Instructions (Signed)
Medication Instructions:  Your physician recommends that you continue on your current medications as directed. Please refer to the Current Medication list given to you today.  *If you need a refill on your cardiac medications before your next appointment, please call your pharmacy*   Lab Work: None If you have labs (blood work) drawn today and your tests are completely normal, you will receive your results only by: [place] (if you have MyChart) OR A paper copy in the mail If you have any lab test that is abnormal or we need to change your treatment, we will call you to review the results.   Testing/Procedures: Your physician has requested that you have an echocardiogram. Echocardiography is a painless test that uses sound waves to create images of your heart. It provides your doctor with information about the size and shape of your heart and how well your heart's chambers and valves are working. This procedure takes approximately one hour. There are no restrictions for this procedure.   Follow-Up: At [facility], you and your health needs are our priority.  As part of our continuing mission to provide you with exceptional heart care, we have created designated Provider Care Teams.  These Care Teams include your primary Cardiologist (physician) and Advanced Practice Providers (APPs -  Physician Assistants and Nurse Practitioners) who all work together to provide you with the care you need, when you need it.  We recommend signing up for the patient portal called "MyChart".  Sign up information is provided on this After Visit Summary.  MyChart is used to connect with patients for Virtual Visits (Telemedicine).  Patients are able to view lab/test results, encounter notes, upcoming appointments, etc.  Non-urgent messages can be sent to your provider as well.   To learn more about what you can do with MyChart, go to [email or web address].    Your next appointment:   4-6  month(s)  The format for your next appointment:   In Person  Provider:   Sinclair Grooms, MD     Other Instructions

## 2021-05-28 NOTE — Progress Notes (Addendum)
Cardiology Office Note:    Date:  05/28/2021   ID:  Dove, Gresham [birth year], MRN [id number]  PCP:  McLean-Scocuzza, Nino Glow, MD  Cardiologist:  Sinclair Grooms, MD   Referring MD: McLean-Scocuzza, Olivia Mackie *   Chief Complaint  Patient presents with   Congestive Heart Failure   Coronary Artery Disease   Hypertension     History of Present Illness:    Natalie Watts is a 75 y.o. 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 Beckie Busing.  Upcoming abdominal aortography and lower extremity angiography percutaneous intervention of totally occluded superficial femoral artery stent.  This will be done by Dr. Trula Slade.   She is accompanied by her daughter.  She has no cardiovascular complaints.  In reviewing all records she has had at least 2 myocardial perfusion scans that did not lead to further investigation.  She does have asymptomatic coronary atherosclerosis.  She has had a prior echo done in [place].  We do not have that information here.  Exertional shortness of breath is a complaint.  This raises the question of whether she could have diastolic heart failure.  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 Loann Quill [facility]    COVID 19+    mid 11/2020    Degenerative disk disease    Diabetes ([place])    DVT (deep venous thrombosis) ([place])    DVT, lower extremity ([place])    left, 05/2013 on Xarelto x 1 month etiology unkown    GERD (gastroesophageal reflux disease)    Gout    H/O myocardial perfusion scan    06/08/20 low risk   Hammer toes, bilateral    HSV-2 infection    Hyperlipidemia    Hypertension    Insomnia    Intertrigo    Lumbar radiculopathy    improved after steroid inj and PT 10/21/20 had right L4/5 L5-S1 transforaminal epidural steroid injections   Onychomycosis    OSA on CPAP    dx in 2020 after hip surgery CPAP 9-13 cm H20 supplies [employer] PSG 03/18/2019 severe OSA AHI 42.1/hr titration 03/27/2019 rec APAP 9-13 cm H20   PAD (peripheral artery disease) ([place])    1 x right, 3 x left in 2020-2022 needs bypass on leg had CTA with run off 11/2020 in [place]   PAD (peripheral artery disease) (HCC)    with stents   Peripheral vascular disease ([place])    Right foot drop    since 2020   Subdeltoid bursitis    Tinea pedis    Vitamin D deficiency     Past Surgical History:  Procedure Laterality Date   BACK SURGERY     cervical laminoplasty C3-C7 Rex Dr. Sheppard Evens fusion/decompression  2/2 myelopathy 03/2014 or 02/2015   COLON SURGERY     colectomy in 2015   COLONOSCOPY  05/01/2020   [place] [facility] [place] [facility] [place] Dr. Edwena Bunde [place] 12 mm d colon polyp, grade 1 IH, moderate diverticulosis tubular   ESOPHAGOGASTRODUODENOSCOPY  05/01/2020   erosive gastritis Dr. Langley [place]   right total hip Right    03/05/2019 or 02/26/2020   TUBAL LIGATION     UPPER GI ENDOSCOPY     05/01/20   VASCULAR SURGERY     08/19/19   VASCULAR SURGERY     07/05/19   VASCULAR SURGERY     05/21/2019 angioplasty since 05/2019, 06/2019 and 08/2019 had 2 left leg and 1 on right leg    Current Medications: Current Meds  Medication Sig   acetaminophen (TYLENOL) 500 MG tablet Take 1,000 mg by mouth 2 (two) times daily as needed for moderate pain or  mild pain. Rapid release   amLODipine (NORVASC) 5 MG tablet Take 5 mg by mouth daily.   B-D UF III MINI PEN NEEDLES 31G X 5 MM MISC Inject 1 each into the skin daily.   Blood Glucose Monitoring Suppl (ONE TOUCH ULTRA 2) w/Device KIT Used to check blood sugars daily.   Cholecalciferol (VITAMIN D3) 50 MCG (2000 UT) capsule Take 2,000 Units by mouth daily.   clopidogrel (PLAVIX) 75 MG tablet Take 75 mg by mouth daily.   Continuous Blood Gluc Sensor (FREESTYLE LIBRE 2 SENSOR) MISC by Does not apply route every 14 (fourteen) days.   Dulaglutide (TRULICITY) 2.01 EO/7.1QR SOPN Inject 0.75 mg into the skin once a week.   ezetimibe (ZETIA) 10 MG tablet Take 10 mg by mouth daily.   glucose blood test strip Use 3 times daily before meals.   LANTUS SOLOSTAR 100 UNIT/ML Solostar Pen Inject 40 Units into the skin daily.   losartan (COZAAR) 50 MG tablet Take 1 tablet (50 mg total) by mouth daily.   nebivolol (BYSTOLIC) 5 MG tablet Take 1 tablet (5 mg total) by mouth daily. Dc tenoretic   omeprazole (PRILOSEC) 40 MG capsule Take 40 mg by mouth daily.   ONETOUCH DELICA LANCETS FINE MISC 1 each by Does not apply route 4 (four) times daily as needed.   pravastatin (PRAVACHOL) 40 MG tablet Take 1 tablet (40 mg total) by mouth daily. At night   sitaGLIPtin (JANUVIA) 100 MG tablet Take 1 tablet (100 mg total) by mouth daily.   XARELTO 2.5 MG TABS tablet Take 2.5 mg by mouth 2 (two) times daily.     Allergies:   Lisinopril, Sulfamethoxazole-trimethoprim, Farxiga [dapagliflozin], Glipizide, Metformin and related, Penicillins, and Tramadol   Social History   Socioeconomic History   Marital status: Divorced    Spouse name: Not on file   Number of children: Not on file   Years of education: Not on file   Highest education level: Not on file  Occupational History   Not on file  Tobacco Use   Smoking status: Former    Packs/day: 1.00    Years: 45.00    Pack years: 45.00    Types: Cigarettes    Quit date:  12/18/2012    Years since quitting: 8.4   Smokeless tobacco: Never  Vaping Use   Vaping Use: Never used  Substance and Sexual Activity   Alcohol use: No   Drug use: Yes    Frequency: 3.0 times per week   Sexual activity: Not on file  Other Topics Concern  Not on file  Social History Narrative   Divorced    2 daughters [name] comes to most appts and is Chief Executive Officer    -lives with daughter [name]    Used to work for [employer] now [employer]    Former smoker age 21 to age 75/2012 quit for sure in 2014 max 1/2 ppd FH lung cancer sister also smoker    Social Determinants of Radio broadcast assistant Strain: Not on file  Food Insecurity: Not on file  Transportation Needs: Not on file  Physical Activity: Not on file  Stress: Not on file  Social Connections: Not on file     Family History: The patient's family history includes Arthritis in her mother and sister; Breast cancer in her maternal grandmother and another family member; Cancer in her father, maternal grandmother, sister, and another family member; Early death in her sister; Hypertension in her daughter, daughter, maternal grandmother, mother, and sister.  ROS:   Please see the history of present illness.    She is concerned about recent increase in losartan dose by her primary physician Dr. Brion Aliment.  all other systems reviewed and are negative.  EKGs/Labs/Other Studies Reviewed:    The following studies were reviewed today: [facility] of [facility] from [facility]: Cardiologist carries the following diagnoses: PAD, diabetes mellitus type 2, hypertension, hyperlipidemia Bilateral lower extremity peripheral angiography: Successful peripheral vascular intervention using laser atherectomy followed by angioplasty of 100% left superficial femoral artery occlusion drug-coated balloon angioplasty thereafter September 13, 2019  Myocardial perfusion imaging February 2019: Pharmacological myocardial perfusion imaging  study with no significant  ischemia Normal wall motion, EF estimated at 96% No EKG changes concerning for ischemia at peak stress or in recovery. Low risk scan  EKG:  EKG normal sinus rhythm, low voltage, PVC, poor R wave progression, otherwise unremarkable.  Low voltage is also present.  Consider amyloid.  Recent Labs: 04/27/2021: ALT 10; Hemoglobin 12.2; Platelets 154.0; TSH 3.03 05/18/2021: BUN 22; Creatinine, Ser 1.39; Potassium 4.3; Sodium 139  Recent Lipid Panel    Component Value Date/Time   CHOL 133 04/27/2021 0735   TRIG 91.0 04/27/2021 0735   HDL 48.00 04/27/2021 0735   CHOLHDL 3 04/27/2021 0735   VLDL 18.2 04/27/2021 0735   LDLCALC 67 04/27/2021 0735    Physical Exam:    VS:  BP 122/72   Pulse 84   Ht 5' 2" (1.575 m)   Wt 168 lb 9.6 oz (76.5 kg)   SpO2 95%   BMI 30.84 kg/m     Wt Readings from Last 3 Encounters:  05/28/21 168 lb 9.6 oz (76.5 kg)  05/27/21 169 lb (76.7 kg)  05/24/21 169 lb (76.7 kg)     GEN: Overweight.  Right foot drop and ambulating. No acute distress HEENT: Normal NECK: No JVD. LYMPHATICS: No lymphadenopathy CARDIAC: No murmur. RRR no gallop, or edema. VASCULAR:  Normal Pulses. No bruits. RESPIRATORY:  Clear to auscultation without rales, wheezing or rhonchi  ABDOMEN: Soft, non-tender, non-distended, No pulsatile mass, MUSCULOSKELETAL: No deformity  SKIN: Warm and dry NEUROLOGIC:  Alert and oriented x 3 PSYCHIATRIC:  Normal affect   ASSESSMENT:    1. Coronary artery disease involving native coronary artery of native heart without angina pectoris   2. PAD (peripheral artery disease) ([place])   3. Primary hypertension   4. Left ventricular hypertrophy   5. Aortic atherosclerosis ([place])   6. Controlled type 2 diabetes mellitus with diabetic peripheral angiopathy without gangrene, unspecified whether long term  insulin use ([place])   7. Mixed hyperlipidemia   8. Stage 3b chronic kidney disease ([place])   9. SOB (shortness of breath)    PLAN:     In order of problems listed above:  She has no symptoms to suggest angina.  She has had at least 2 nuclear perfusion studies, most recently 2019.   Dr. Trula Slade with upcoming invasive procedure. Continue same therapy.  Blood pressures well controlled.  Relatively low voltage.  Consider amyloid if appropriate. 2D Doppler echocardiogram.  Has tried [place] in the past but it caused diarrhea.  Consider Jardiance if there is a significant signal for diastolic heart failure. Primary/secondary prevention discussed including the importance of cholesterol-lowering.  Continue Pravachol and Zetia.  Most recent LDL was 67 in November. Hemoglobin A1c less than 7 is desired Continues ezetimibe and pravastatin. Noted but not discussed She currently complains of dyspnea on exertion without significant chest pain.  Dyspnea could be an anginal equivalent.  Needed 2D Doppler echocardiogram performed to assess LV size, function, and diastolic parameters.  She may benefit from SGL T2 therapy if we do not think ischemia is at play.  Has tried [place] in the past.  We will try to obtain information from her cardiologist in [place]   Medication Adjustments/Labs and Tests Ordered: Current medicines are reviewed at length with the patient today.  Concerns regarding medicines are outlined above.  Orders Placed This Encounter  Procedures   EKG 12-Lead   ECHOCARDIOGRAM COMPLETE    No orders of the defined types were placed in this encounter.   Patient Instructions  Medication Instructions:  Your physician recommends that you continue on your current medications as directed. Please refer to the Current Medication list given to you today.  *If you need a refill on your cardiac medications before your next appointment, please call your pharmacy*   Lab Work: None If you have labs (blood work) drawn today and your tests are completely normal, you will receive your results only by: [place] (if you have  MyChart) OR A paper copy in the mail If you have any lab test that is abnormal or we need to change your treatment, we will call you to review the results.   Testing/Procedures: Your physician has requested that you have an echocardiogram. Echocardiography is a painless test that uses sound waves to create images of your heart. It provides your doctor with information about the size and shape of your heart and how well your heart's chambers and valves are working. This procedure takes approximately one hour. There are no restrictions for this procedure.   Follow-Up: At [facility], you and your health needs are our priority.  As part of our continuing mission to provide you with exceptional heart care, we have created designated Provider Care Teams.  These Care Teams include your primary Cardiologist (physician) and Advanced Practice Providers (APPs -  Physician Assistants and Nurse Practitioners) who all work together to provide you with the care you need, when you need it.  We recommend signing up for the patient portal called "MyChart".  Sign up information is provided on this After Visit Summary.  MyChart is used to connect with patients for Virtual Visits (Telemedicine).  Patients are able to view lab/test results, encounter notes, upcoming appointments, etc.  Non-urgent messages can be sent to your provider as well.   To learn more about what you can do with MyChart, go to [email or web address].    Your next appointment:   4-6 month(s)  The format for your next appointment:   In Person  Provider:   Sinclair Grooms, MD     Other Instructions     Signed, Sinclair Grooms, MD  05/28/2021 5:54 PM    [place]

## 2021-05-31 ENCOUNTER — Telehealth: Payer: Self-pay | Admitting: Internal Medicine

## 2021-05-31 NOTE — Telephone Encounter (Signed)
Faxed ROI to the follow ing practices and received confirmation that faxes went through:   [facility] [place] [facility]  Pulmonary and sleep specialist pf [facility] [place] [facility]   All ROIs sent to scan.

## 2021-06-01 ENCOUNTER — Encounter (HOSPITAL_COMMUNITY)
Admission: RE | Disposition: A | Discharge status: Home / Self Care | Payer: Self-pay | Source: Ambulatory Visit | Attending: Surgery

## 2021-06-01 ENCOUNTER — Observation Stay (HOSPITAL_COMMUNITY)
Admission: RE | Admit: 2021-06-01 | Discharge: 2021-06-02 | Disposition: A | Discharge status: Home / Self Care | Payer: Medicare PPO | Source: Ambulatory Visit | Attending: Surgery | Admitting: Surgery

## 2021-06-01 ENCOUNTER — Other Ambulatory Visit: Payer: Self-pay

## 2021-06-01 DIAGNOSIS — I739 Peripheral vascular disease, unspecified: Secondary | ICD-10-CM | POA: Diagnosis not present

## 2021-06-01 DIAGNOSIS — Z803 Family history of malignant neoplasm of breast: Secondary | ICD-10-CM | POA: Insufficient documentation

## 2021-06-01 DIAGNOSIS — I2510 Atherosclerotic heart disease of native coronary artery without angina pectoris: Secondary | ICD-10-CM | POA: Diagnosis not present

## 2021-06-01 DIAGNOSIS — E119 Type 2 diabetes mellitus without complications: Secondary | ICD-10-CM | POA: Diagnosis not present

## 2021-06-01 DIAGNOSIS — Z801 Family history of malignant neoplasm of trachea, bronchus and lung: Secondary | ICD-10-CM | POA: Diagnosis not present

## 2021-06-01 DIAGNOSIS — M7981 Nontraumatic hematoma of soft tissue: Secondary | ICD-10-CM | POA: Diagnosis not present

## 2021-06-01 DIAGNOSIS — Z87891 Personal history of nicotine dependence: Secondary | ICD-10-CM | POA: Insufficient documentation

## 2021-06-01 DIAGNOSIS — Z7901 Long term (current) use of anticoagulants: Secondary | ICD-10-CM | POA: Insufficient documentation

## 2021-06-01 DIAGNOSIS — Z86718 Personal history of other venous thrombosis and embolism: Secondary | ICD-10-CM | POA: Diagnosis not present

## 2021-06-01 DIAGNOSIS — N183 Chronic kidney disease, stage 3 unspecified: Secondary | ICD-10-CM | POA: Insufficient documentation

## 2021-06-01 DIAGNOSIS — G4733 Obstructive sleep apnea (adult) (pediatric): Secondary | ICD-10-CM | POA: Diagnosis not present

## 2021-06-01 DIAGNOSIS — I70213 Atherosclerosis of native arteries of extremities with intermittent claudication, bilateral legs: Secondary | ICD-10-CM | POA: Diagnosis not present

## 2021-06-01 DIAGNOSIS — I129 Hypertensive chronic kidney disease with stage 1 through stage 4 chronic kidney disease, or unspecified chronic kidney disease: Secondary | ICD-10-CM | POA: Diagnosis not present

## 2021-06-01 DIAGNOSIS — Z7985 Long-term (current) use of injectable non-insulin antidiabetic drugs: Secondary | ICD-10-CM | POA: Insufficient documentation

## 2021-06-01 DIAGNOSIS — Z79899 Other long term (current) drug therapy: Secondary | ICD-10-CM | POA: Insufficient documentation

## 2021-06-01 HISTORY — PX: ABDOMINAL AORTOGRAM W/LOWER EXTREMITY: CATH118223

## 2021-06-01 HISTORY — PX: PERIPHERAL VASCULAR INTERVENTION: CATH118257

## 2021-06-01 LAB — POCT ACTIVATED CLOTTING TIME
Activated Clotting Time: 227 seconds
Activated Clotting Time: 209 seconds
Activated Clotting Time: 185 seconds

## 2021-06-01 LAB — HEMOGLOBIN AND HEMATOCRIT, BLOOD
HCT: 33.9 % — ABNORMAL LOW (ref 36.0–46.0)
Hemoglobin: 10.6 g/dL — ABNORMAL LOW (ref 12.0–15.0)

## 2021-06-01 LAB — POCT I-STAT, CHEM 8
BUN: 22 mg/dL (ref 8–23)
Calcium, Ion: 1.17 mmol/L (ref 1.15–1.40)
Chloride: 106 mmol/L (ref 98–111)
Creatinine, Ser: 1.4 mg/dL — ABNORMAL HIGH (ref 0.44–1.00)
Glucose, Bld: 117 mg/dL — ABNORMAL HIGH (ref 70–99)
HCT: 36 % (ref 36.0–46.0)
Hemoglobin: 12.2 g/dL (ref 12.0–15.0)
Potassium: 3.7 mmol/L (ref 3.5–5.1)
Sodium: 141 mmol/L (ref 135–145)
TCO2: 26 mmol/L (ref 22–32)

## 2021-06-01 LAB — GLUCOSE, CAPILLARY
Glucose-Capillary: 84 mg/dL (ref 70–99)
Glucose-Capillary: 85 mg/dL (ref 70–99)
Glucose-Capillary: 78 mg/dL (ref 70–99)
Glucose-Capillary: 144 mg/dL — ABNORMAL HIGH (ref 70–99)

## 2021-06-01 SURGERY — ABDOMINAL AORTOGRAM W/LOWER EXTREMITY
Anesthesia: LOCAL

## 2021-06-01 MED ORDER — MIDAZOLAM HCL 2 MG/2ML IJ SOLN
INTRAMUSCULAR | Status: DC | PRN
  Administered 2021-06-01: 2 mg via INTRAVENOUS

## 2021-06-01 MED ORDER — PANTOPRAZOLE SODIUM 40 MG PO TBEC
40.0000 mg | DELAYED_RELEASE_TABLET | Freq: Every day | ORAL | Status: DC
  Administered 2021-06-01 – 2021-06-02 (×2): 40 mg via ORAL
  Filled 2021-06-01 (×2): qty 1

## 2021-06-01 MED ORDER — ASPIRIN EC 81 MG PO TBEC
81.0000 mg | DELAYED_RELEASE_TABLET | Freq: Every day | ORAL | Status: DC
  Administered 2021-06-02: 09:00:00 81 mg via ORAL
  Filled 2021-06-01: qty 1

## 2021-06-01 MED ORDER — FENTANYL CITRATE (PF) 100 MCG/2ML IJ SOLN
INTRAMUSCULAR | Status: DC | PRN
  Administered 2021-06-01: 50 ug via INTRAVENOUS

## 2021-06-01 MED ORDER — MORPHINE SULFATE (PF) 2 MG/ML IV SOLN: 2.0000 mg | INTRAVENOUS | Status: DC | PRN

## 2021-06-01 MED ORDER — INSULIN ASPART 100 UNIT/ML IJ SOLN: 0.0000 [IU] | Freq: Three times a day (TID) | INTRAMUSCULAR | Status: DC

## 2021-06-01 MED ORDER — OXYCODONE-ACETAMINOPHEN 5-325 MG PO TABS
1.0000 | ORAL_TABLET | Freq: Once | ORAL | Status: DC
  Filled 2021-06-01: qty 1

## 2021-06-01 MED ORDER — SODIUM CHLORIDE 0.9 % WEIGHT BASED INFUSION: 1.0000 mL/kg/h | INTRAVENOUS | Status: DC

## 2021-06-01 MED ORDER — AMLODIPINE BESYLATE 5 MG PO TABS
5.0000 mg | ORAL_TABLET | Freq: Every day | ORAL | Status: DC
  Administered 2021-06-01 – 2021-06-02 (×2): 5 mg via ORAL
  Filled 2021-06-01 (×2): qty 1

## 2021-06-01 MED ORDER — SODIUM CHLORIDE 0.9 % IV SOLN: INTRAVENOUS | Status: AC

## 2021-06-01 MED ORDER — LABETALOL HCL 5 MG/ML IV SOLN: 10.0000 mg | INTRAVENOUS | Status: DC | PRN

## 2021-06-01 MED ORDER — ONDANSETRON HCL 4 MG/2ML IJ SOLN
4.0000 mg | Freq: Four times a day (QID) | INTRAMUSCULAR | Status: DC | PRN
  Filled 2021-06-01: qty 2

## 2021-06-01 MED ORDER — LIDOCAINE HCL (PF) 1 % IJ SOLN
INTRAMUSCULAR | Status: DC | PRN
  Administered 2021-06-01: 15 mL

## 2021-06-01 MED ORDER — FENTANYL CITRATE (PF) 100 MCG/2ML IJ SOLN
INTRAMUSCULAR | Status: AC
  Filled 2021-06-01: qty 2

## 2021-06-01 MED ORDER — ACETAMINOPHEN 325 MG RE SUPP
325.0000 mg | RECTAL | Status: DC | PRN
  Filled 2021-06-01: qty 2

## 2021-06-01 MED ORDER — IODIXANOL 320 MG/ML IV SOLN
INTRAVENOUS | Status: DC | PRN
  Administered 2021-06-01: 160 mL

## 2021-06-01 MED ORDER — SODIUM CHLORIDE 0.9 % IV SOLN: INTRAVENOUS | Status: DC

## 2021-06-01 MED ORDER — GUAIFENESIN-DM 100-10 MG/5ML PO SYRP: 15.0000 mL | ORAL_SOLUTION | ORAL | Status: DC | PRN

## 2021-06-01 MED ORDER — LOSARTAN POTASSIUM 50 MG PO TABS
50.0000 mg | ORAL_TABLET | Freq: Every day | ORAL | Status: DC
  Administered 2021-06-02: 09:00:00 50 mg via ORAL
  Filled 2021-06-01: qty 1

## 2021-06-01 MED ORDER — NEBIVOLOL HCL 5 MG PO TABS
5.0000 mg | ORAL_TABLET | Freq: Every day | ORAL | Status: DC
  Administered 2021-06-01 – 2021-06-02 (×2): 5 mg via ORAL
  Filled 2021-06-01 (×2): qty 1

## 2021-06-01 MED ORDER — ACETAMINOPHEN 325 MG PO TABS: 650.0000 mg | ORAL_TABLET | ORAL | Status: DC | PRN

## 2021-06-01 MED ORDER — ASPIRIN EC 81 MG PO TBEC: 81.0000 mg | DELAYED_RELEASE_TABLET | Freq: Every day | ORAL | 2 refills | Status: DC

## 2021-06-01 MED ORDER — SODIUM CHLORIDE 0.9% FLUSH: 3.0000 mL | INTRAVENOUS | Status: DC | PRN

## 2021-06-01 MED ORDER — SODIUM CHLORIDE 0.9% FLUSH: 3.0000 mL | Freq: Two times a day (BID) | INTRAVENOUS | Status: DC

## 2021-06-01 MED ORDER — HEPARIN SODIUM (PORCINE) 1000 UNIT/ML IJ SOLN
INTRAMUSCULAR | Status: DC | PRN
  Administered 2021-06-01: 8000 [IU] via INTRAVENOUS

## 2021-06-01 MED ORDER — HYDRALAZINE HCL 20 MG/ML IJ SOLN: 5.0000 mg | INTRAMUSCULAR | Status: DC | PRN

## 2021-06-01 MED ORDER — CLOPIDOGREL BISULFATE 75 MG PO TABS
75.0000 mg | ORAL_TABLET | Freq: Every day | ORAL | Status: DC
  Administered 2021-06-02: 09:00:00 75 mg via ORAL
  Filled 2021-06-01: qty 1

## 2021-06-01 MED ORDER — ALUM & MAG HYDROXIDE-SIMETH 200-200-20 MG/5ML PO SUSP: 15.0000 mL | ORAL | Status: DC | PRN

## 2021-06-01 MED ORDER — MIDAZOLAM HCL 2 MG/2ML IJ SOLN
INTRAMUSCULAR | Status: AC
  Filled 2021-06-01: qty 2

## 2021-06-01 MED ORDER — ONDANSETRON HCL 4 MG/2ML IJ SOLN
4.0000 mg | Freq: Four times a day (QID) | INTRAMUSCULAR | Status: DC | PRN
  Administered 2021-06-01: 4 mg via INTRAVENOUS

## 2021-06-01 MED ORDER — METOPROLOL TARTRATE 5 MG/5ML IV SOLN
2.0000 mg | INTRAVENOUS | Status: DC | PRN
Start: 2021-06-01 — End: 2021-06-02

## 2021-06-01 MED ORDER — MORPHINE SULFATE (PF) 2 MG/ML IV SOLN
2.0000 mg | Freq: Once | INTRAVENOUS | Status: AC
  Administered 2021-06-01: 2 mg via INTRAVENOUS

## 2021-06-01 MED ORDER — MORPHINE SULFATE (PF) 2 MG/ML IV SOLN
INTRAVENOUS | Status: AC
  Filled 2021-06-01: qty 1

## 2021-06-01 MED ORDER — OXYCODONE-ACETAMINOPHEN 5-325 MG PO TABS: 1.0000 | ORAL_TABLET | ORAL | Status: DC | PRN

## 2021-06-01 MED ORDER — PHENOL 1.4 % MT LIQD: 1.0000 | OROMUCOSAL | Status: DC | PRN

## 2021-06-01 MED ORDER — PRAVASTATIN SODIUM 40 MG PO TABS
40.0000 mg | ORAL_TABLET | Freq: Every day | ORAL | Status: DC
  Administered 2021-06-01: 40 mg via ORAL
  Filled 2021-06-01: qty 1

## 2021-06-01 MED ORDER — HEPARIN SODIUM (PORCINE) 1000 UNIT/ML IJ SOLN
INTRAMUSCULAR | Status: AC
  Filled 2021-06-01: qty 10

## 2021-06-01 MED ORDER — SODIUM CHLORIDE 0.9 % IV SOLN: 250.0000 mL | INTRAVENOUS | Status: DC | PRN

## 2021-06-01 MED ORDER — HEPARIN (PORCINE) IN NACL 1000-0.9 UT/500ML-% IV SOLN
INTRAVENOUS | Status: AC
  Filled 2021-06-01: qty 500

## 2021-06-01 MED ORDER — CLOPIDOGREL BISULFATE 75 MG PO TABS: 75.0000 mg | ORAL_TABLET | Freq: Every day | ORAL | Status: DC

## 2021-06-01 MED ORDER — LIDOCAINE HCL (PF) 1 % IJ SOLN
INTRAMUSCULAR | Status: AC
  Filled 2021-06-01: qty 30

## 2021-06-01 MED ORDER — ACETAMINOPHEN 325 MG PO TABS: 325.0000 mg | ORAL_TABLET | ORAL | Status: DC | PRN

## 2021-06-01 MED ORDER — HEPARIN (PORCINE) IN NACL 1000-0.9 UT/500ML-% IV SOLN
INTRAVENOUS | Status: DC | PRN
  Administered 2021-06-01: 500 mL

## 2021-06-01 SURGICAL SUPPLY — 19 items
BAG SNAP BAND KOVER 36X36 (MISCELLANEOUS) ×2 IMPLANT
BALLN MUSTANG 5X150X135 (BALLOONS) ×4
BALLOON MUSTANG 5X150X135 (BALLOONS) IMPLANT
CATH OMNI FLUSH 5F 65CM (CATHETERS) ×2 IMPLANT
COVER DOME SNAP 22 D (MISCELLANEOUS) ×4 IMPLANT
DEVICE CONTINUOUS FLUSH (MISCELLANEOUS) ×2 IMPLANT
KIT ENCORE 26 ADVANTAGE (KITS) ×2 IMPLANT
KIT MICROPUNCTURE NIT STIFF (SHEATH) ×2 IMPLANT
KIT PV (KITS) ×4 IMPLANT
SHEATH PINNACLE 5F 10CM (SHEATH) ×2 IMPLANT
SHEATH PINNACLE MP 6F 45CM (SHEATH) ×2 IMPLANT
SHEATH PROBE COVER 6X72 (BAG) ×2 IMPLANT
STENT ELUVIA 6X150X130 (Permanent Stent) ×2 IMPLANT
STENT ELUVIA 6X40X130 (Permanent Stent) ×2 IMPLANT
SYR MEDRAD MARK V 150ML (SYRINGE) ×2 IMPLANT
TRANSDUCER W/STOPCOCK (MISCELLANEOUS) ×4 IMPLANT
TRAY PV CATH (CUSTOM PROCEDURE TRAY) ×4 IMPLANT
WIRE BENTSON .035X145CM (WIRE) ×2 IMPLANT
WIRE HI TORQ VERSACORE J 260CM (WIRE) ×2 IMPLANT

## 2021-06-01 NOTE — Progress Notes (Signed)
VASCULAR SURGERY:  Called to see patient because of no Doppler signal in left foot.  I am able to obtain an anterior tibial and peroneal signal on the left with the Doppler.  She had an intervention on the right SFA today and has brisk signals in her posterior tibial and dorsalis pedis position on the right.  Gae Gallop, MD 7:11 PM

## 2021-06-01 NOTE — Progress Notes (Signed)
Client c/o nausea; O2 sat 88; O2 started at 2l/min via nasal cannula

## 2021-06-01 NOTE — Progress Notes (Signed)
Client sleeping; when awakened to check groin, states no pain left groin at present

## 2021-06-01 NOTE — Interval H&P Note (Signed)
History and Physical Interval Note:  06/01/2021 9:00 AM  Natalie Watts  has presented today for surgery, with the diagnosis of claudication.  The various methods of treatment have been discussed with the patient and family. After consideration of risks, benefits and other options for treatment, the patient has consented to  Procedure(s): ABDOMINAL AORTOGRAM W/LOWER EXTREMITY (N/A) as a surgical intervention.  The patient's history has been reviewed, patient examined, no change in status, stable for surgery.  I have reviewed the patient's chart and labs.  Questions were answered to the patient's satisfaction.     Annamarie Major

## 2021-06-01 NOTE — Progress Notes (Addendum)
Pressure held continuously x 1 hour and several areas of firmness noted; patient c/o pain with pressure; Dagoberto Ligas PA and cath lab staff member Sherrell in to see patient and orders noted and hematoma marked; size 20x10cm

## 2021-06-01 NOTE — Op Note (Signed)
Patient name: Natalie Watts MRN: [id number] DOB: [birth year] Sex: female  06/01/2021 Pre-operative Diagnosis: Bilateral claudication Post-operative diagnosis:  Same Surgeon:  Annamarie Major Procedure Performed:  1.  Ultrasound-guided access, left femoral artery  2.  Abdominal aortogram  3.  Bilateral lower extremity runoff  4.  Stent, right superficial femoral artery  5.  Conscious sedation, 60 minutes   Indications: This is a 75 year old female who was previously undergone lower extremity interventions in [place] in [place].  She was found to have stenosis on follow-up ultrasound and comes in today for further evaluation.  Procedure:  The patient was identified in the holding area and taken to room 8.  The patient was then placed supine on the table and prepped and draped in the usual sterile fashion.  A time out was called.  Conscious sedation was administered with the use of IV fentanyl and Versed under continuous physician and nurse monitoring.  Heart rate, blood pressure, and oxygen saturation were continuously monitored.  Total sedation time was 60 minutes.  Ultrasound was used to evaluate the left common femoral artery.  It was patent .  A digital ultrasound image was acquired.  A micropuncture needle was used to access the left common femoral artery under ultrasound guidance.  An 018 wire was advanced without resistance and a micropuncture sheath was placed.  The 018 wire was removed and a benson wire was placed.  The micropuncture sheath was exchanged for a 5 french sheath.  An omniflush catheter was advanced over the wire to the level of L-1.  An abdominal angiogram was obtained.  Next, using the omniflush catheter and a benson wire, the aortic bifurcation was crossed and the catheter was placed into theright external iliac artery and right runoff was obtained.  Left runoff was performed via retrograde sheath injections.  Findings:   Aortogram: No significant renal artery stenosis  was identified.  The infrarenal abdominal aorta is widely patent.  Bilateral common and external iliac arteries are widely patent.  Right Lower Extremity: The right common femoral and profundofemoral artery are patent without stenosis.  There is moderate luminal narrowing approximately 40 to 50% of the proximal superficial femoral artery.  There is a stent within the mid superficial femoral artery with a 80% stenosis distally.  The remaining portion of the superficial femoral and proximal popliteal artery had diffuse luminal narrowing with stenosis greater than 50% down to the level of the patella.  Popliteal artery is widely patent.  There is three-vessel runoff to the ankle.  The dominant runoff across the ankle is the posterior tibial artery.   Left Lower Extremity: The left common femoral and profundofemoral artery are widely patent.  The superficial femoral artery is occluded.  It reconstitutes just at the level of the patella.  The below-knee popliteal artery is widely patent.  There is two-vessel runoff via the peroneal and anterior tibial artery with diffuse disease out onto the foot.  Intervention: After the above images were acquired the decision made to proceed with intervention.  A 6 French 45 cm sheath was advanced into the right external iliac artery.  The patient was fully heparinized.  Next a 035 wire was advanced into the popliteal artery without resistance.  I elected to primarily stent the superficial femoral and proximal popliteal artery.  A 6 x 150 followed by a 6 x 40 Elluvia was deployed, overlapping with the previously placed stent.  These were postdilated with a 5 mm balloon.  Completion imaging showed resolution  of the stenosis from the superficial femoral artery.  The runoff was unchanged out of ankle however at this level, there was decreased opacification of the posterior tibial artery.  I did not feel that getting a catheter into her small arteries would be advantageous.  She  appeared to be with adequate perfusion to the foot so we will leave this.  Next the sheath and wires were withdrawn into the external iliac artery on the left.  Requires revascularization this would be a femoral to below-knee popliteal bypass graft.  This should not be done for her current symptoms of claudication.  Taken the holding area for sheath pull once her coagulation profile corrects.  Impression:  #1  Successful stenting of a high-grade stenosis distal to her previously placed stent.  6 mm overlapping Elluvia ends were placed.  #2  Occluded left superficial femoral and proximal popliteal artery. \  V. Annamarie Major, M.D., [facility] [place] Office: [phone] Pager:  [phone]

## 2021-06-01 NOTE — Progress Notes (Signed)
Gave report to nurse on [address] prior to transporting patient.

## 2021-06-01 NOTE — Progress Notes (Signed)
Client c/o nausea and turned to side and client sleeping again

## 2021-06-01 NOTE — Progress Notes (Addendum)
Patient arrived to 4E02 from PACU after abdl aortogram and 2 right SFA stents w/Dr. Trula Slade.  Telemetry monitor applied and CCMD notified.  Patient oriented to unit and room to include call light and phone.  CHG bath and skin assessment completed.  Anterior tibial pulse Dopplerable on right foot.  DP pulse Dopplerable on left foot.  Will continue to monitor.

## 2021-06-01 NOTE — Progress Notes (Signed)
On arrival from cath lab, firm area noted left groin lateral to dressing .Marland KitchenMarland Kitchenpressure held x 74min and then when pressure released left groin area of firmness increased and pressure held again; Dr Trula Slade in and per Dr Trula Slade  will hold pressure x 66min

## 2021-06-01 NOTE — Progress Notes (Signed)
Site area: Left groin a 6 french arterial sheath was removed  Site Prior to Removal:  Level 0  Pressure Applied For 20 MINUTES    Bedrest Beginning at 1415pm X 4 h[place]ra  Manual:   Yes.    Patient Status During Pull:  stable  Post Pull Groin Site:  Level 0  Post Pull Instructions Given:  Yes.    Post Pull Pulses Present:  Yes.    Dressing Applied:  Yes.    Comments:

## 2021-06-02 ENCOUNTER — Encounter (HOSPITAL_COMMUNITY): Payer: Self-pay | Admitting: Surgery

## 2021-06-02 ENCOUNTER — Observation Stay (HOSPITAL_BASED_OUTPATIENT_CLINIC_OR_DEPARTMENT_OTHER): Payer: Medicare PPO

## 2021-06-02 DIAGNOSIS — R1032 Left lower quadrant pain: Secondary | ICD-10-CM | POA: Diagnosis not present

## 2021-06-02 DIAGNOSIS — Z95820 Peripheral vascular angioplasty status with implants and grafts: Secondary | ICD-10-CM | POA: Diagnosis not present

## 2021-06-02 DIAGNOSIS — I70213 Atherosclerosis of native arteries of extremities with intermittent claudication, bilateral legs: Secondary | ICD-10-CM | POA: Diagnosis not present

## 2021-06-02 DIAGNOSIS — I739 Peripheral vascular disease, unspecified: Secondary | ICD-10-CM | POA: Diagnosis not present

## 2021-06-02 LAB — GLUCOSE, CAPILLARY
Glucose-Capillary: 103 mg/dL — ABNORMAL HIGH (ref 70–99)
Glucose-Capillary: 103 mg/dL — ABNORMAL HIGH (ref 70–99)

## 2021-06-02 LAB — HEMOGLOBIN AND HEMATOCRIT, BLOOD
HCT: 29.9 % — ABNORMAL LOW (ref 36.0–46.0)
Hemoglobin: 9.6 g/dL — ABNORMAL LOW (ref 12.0–15.0)

## 2021-06-02 LAB — CBC
HCT: 29.2 % — ABNORMAL LOW (ref 36.0–46.0)
Hemoglobin: 9.3 g/dL — ABNORMAL LOW (ref 12.0–15.0)
MCH: 27.8 pg (ref 26.0–34.0)
MCHC: 31.8 g/dL (ref 30.0–36.0)
MCV: 87.4 fL (ref 80.0–100.0)
Platelets: 163 10*3/uL (ref 150–400)
RBC: 3.34 MIL/uL — ABNORMAL LOW (ref 3.87–5.11)
RDW: 14.7 % (ref 11.5–15.5)
WBC: 8.3 10*3/uL (ref 4.0–10.5)
nRBC: 0 % (ref 0.0–0.2)

## 2021-06-02 LAB — BASIC METABOLIC PANEL
Anion gap: 7 (ref 5–15)
BUN: 20 mg/dL (ref 8–23)
CO2: 22 mmol/L (ref 22–32)
Calcium: 8.1 mg/dL — ABNORMAL LOW (ref 8.9–10.3)
Chloride: 106 mmol/L (ref 98–111)
Creatinine, Ser: 1.41 mg/dL — ABNORMAL HIGH (ref 0.44–1.00)
GFR, Estimated: 39 mL/min — ABNORMAL LOW (ref >60)
Glucose, Bld: 143 mg/dL — ABNORMAL HIGH (ref 70–99)
Potassium: 3.7 mmol/L (ref 3.5–5.1)
Sodium: 135 mmol/L (ref 135–145)

## 2021-06-02 NOTE — Care Management Obs Status (Signed)
[place] NOTIFICATION   Patient Details  Name: Natalie Watts MRN: [id number] Date of Birth: May 12, 1946   Medicare Observation Status Notification Given:  Yes    Carles Collet, RN 06/02/2021, 2:08 PM

## 2021-06-02 NOTE — Discharge Instructions (Signed)
° °  [facility] ° °Discharge Instructions ° °Lower Extremity Angiogram; Angioplasty/Stenting ° °Please refer to the following instructions for your post-procedure care. Your surgeon or physician assistant will discuss any changes with you. ° °Activity ° °Avoid lifting more than 8 pounds (1 gallons of milk) for 72 hours (3 days) after your procedure. You may walk as much as you can tolerate. It's OK to drive after 72 hours. ° °Bathing/Showering ° °You may shower the day after your procedure. If you have a bandage, you may remove it at 24- 48 hours. Clean your incision site with mild soap and water. Pat the area dry with a clean towel. ° °Diet ° °Resume your pre-procedure diet. There are no special food restrictions following this procedure. All patients with peripheral vascular disease should follow a low fat/low cholesterol diet. In order to heal from your surgery, it is CRITICAL to get adequate nutrition. Your body requires vitamins, minerals, and protein. Vegetables are the best source of vitamins and minerals. Vegetables also provide the perfect balance of protein. Processed food has little nutritional value, so try to avoid this. ° °Medications ° °Resume taking all of your medications unless your doctor tells you not to. If your incision is causing pain, you may take over-the-counter pain relievers such as acetaminophen (Tylenol) ° °Follow Up ° °Follow up will be arranged at the time of your procedure. You may have an office visit scheduled or may be scheduled for surgery. Ask your surgeon if you have any questions. ° °Please call us immediately for any of the following conditions: °•Severe or worsening pain your legs or feet at rest or with walking. °•Increased pain, redness, drainage at your groin puncture site. °•Fever of 101 degrees or higher. °•If you have any mild or slow bleeding from your puncture site: lie down, apply firm constant pressure over the area with a piece of  gauze or a clean wash cloth for 30 minutes- no peeking!, call 911 right away if you are still bleeding after 30 minutes, or if the bleeding is heavy and unmanageable. ° °Reduce your risk factors of vascular disease: ° °Stop smoking. If you would like help call [facility] at 1-800-QUIT-NOW (1-800-784-8669) or [facility] at 336-586-4000. °Manage your cholesterol °Maintain a desired weight °Control your diabetes °Keep your blood pressure down ° °If you have any questions, please call the office at [phone] ° °

## 2021-06-02 NOTE — Progress Notes (Signed)
°  Transition of Care [facility]) Screening Note   Patient Details  Name: Natalie Watts Date of Birth: 1946/01/28   Transition of Care [facility]) CM/SW Contact:    Dawayne Patricia, RN Phone Number: 06/02/2021, 11:41 AM    Transition of Care Department [facility]) has reviewed patient and no TOC needs have been identified at this time. We will continue to monitor patient advancement through interdisciplinary progression rounds. If new patient transition needs arise, please place a TOC consult.

## 2021-06-02 NOTE — Progress Notes (Addendum)
°  Progress Note    06/02/2021 8:34 AM 1 Day Post-Op  Subjective:  no pain L groin at rest.  Denies rest pain in feet   Vitals:   06/02/21 0405 06/02/21 0828  BP: 115/63 120/63  Pulse: 78 78  Resp: 20 17  Temp: 99.2 F (37.3 C) 99.3 F (37.4 C)  SpO2: 95% 94%   Physical Exam: Lungs:  non labored Incisions:  L groin cath site soft, without firm hematoma; ecchymosis Extremities:  brisk ATA bilaterally Neurologic: A&O  CBC    Component Value Date/Time   WBC 8.3 06/02/2021 0117   RBC 3.34 (L) 06/02/2021 0117   HGB 9.3 (L) 06/02/2021 0117   HGB 14.3 09/12/2014 1013   HCT 29.2 (L) 06/02/2021 0117   HCT 44.3 09/12/2014 1013   PLT 163 06/02/2021 0117   PLT 187 09/12/2014 1013   MCV 87.4 06/02/2021 0117   MCV 86 09/12/2014 1013   MCH 27.8 06/02/2021 0117   MCHC 31.8 06/02/2021 0117   RDW 14.7 06/02/2021 0117   RDW 15.7 (H) 09/12/2014 1013   LYMPHSABS 2.4 04/27/2021 0735   MONOABS 0.4 04/27/2021 0735   EOSABS 0.1 04/27/2021 0735   BASOSABS 0.0 04/27/2021 0735    BMET    Component Value Date/Time   NA 135 06/02/2021 0117   NA 138 09/12/2014 1013   K 3.7 06/02/2021 0117   K 4.7 09/12/2014 1013   CL 106 06/02/2021 0117   CL 100 (L) 09/12/2014 1013   CO2 22 06/02/2021 0117   CO2 27 09/12/2014 1013   GLUCOSE 143 (H) 06/02/2021 0117   GLUCOSE 262 (H) 09/12/2014 1013   BUN 20 06/02/2021 0117   BUN 21 (H) 09/12/2014 1013   CREATININE 1.41 (H) 06/02/2021 0117   CREATININE 1.12 (H) 09/12/2014 1013   CALCIUM 8.1 (L) 06/02/2021 0117   CALCIUM 10.1 09/12/2014 1013   GFRNONAA 39 (L) 06/02/2021 0117   GFRNONAA 50 (L) 09/12/2014 1013   GFRAA 58 (L) 09/12/2014 1013    INR    Component Value Date/Time   INR 1.1 05/10/2013 1110     Intake/Output Summary (Last 24 hours) at 06/02/2021 0834 Last data filed at 06/02/2021 0753 Gross per 24 hour  Intake 2685.68 ml  Output 400 ml  Net 2285.68 ml     Assessment/Plan:  75 y.o. female is s/p R SFA stent kept  overnight due to L groin hematoma 1 Day Post-Op   BLE well perfused by doppler exam today L groin soft this morning; rule out active bleed/pseudo with ultrasound this morning Blood loss anemia: check H&H later this morning Home this afternoon if ultrasound negative and if H&H is stable   Dagoberto Ligas, PA-C [facility] [phone] 06/02/2021 8:34 AM  I agree with the above.  I have seen and evaluated the patient.  She was kept after her angiographic intervention yesterday secondary to hematoma in the left groin.  This is soft this morning however her hemoglobin has trended down slightly.  We will get a ultrasound to rule out pseudoaneurysm and then continue with serial hemoglobins.  If the ultrasound is negative and her hemoglobin stabilizes, she can go home.  She has Doppler signals bilaterally.  Annamarie Major

## 2021-06-02 NOTE — Progress Notes (Signed)
Patient discharging home with daughter. IV taken out with no complications. Tele removed and CCMD notified. Discharge instructions given to patient and daughter. Medication administration education given and all questions answered.

## 2021-06-10 NOTE — Discharge Summary (Signed)
Discharge Summary  Patient ID: Natalie Watts [id number] 75 y.o. [birth year]  Admit date: 06/01/2021  Discharge date and time: 06/02/2021  6:00 PM   Admitting Physician: Serafina Mitchell, MD   Discharge Physician: same  Admission Diagnoses: PAD (peripheral artery disease) [facility]) [I73.9]  Discharge Diagnoses: PAD Left groin hematoma  Admission Condition: fair  Discharged Condition: fair  Indication for Admission: Observation of left common femoral artery catheterization site  Hospital Course: Natalie Watts is a 75 year old female who was brought in as an outpatient for aortogram with right lower extremity arteriogram and right SFA stent due to claudication.  This was performed by Dr. Trula Slade on 06/01/2021.  Postoperatively she developed a hematoma at her left groin catheterization site.  For this reason she was kept in observation overnight.  POD #1 left groin hematoma was unchanged.  Ultrasound was obtained which was negative for pseudoaneurysm.  H&H was also repeated which showed stabilization.  Bilateral lower extremities were warm and well perfused by the time of discharge home.  She will follow-up in 4 to 6 weeks for imaging studies.  She was discharged home in stable condition.  Consults: None  Treatments: surgery: Aortogram with right lower extremity SFA stent by Dr. Trula Slade on 06/01/2021  Discharge Exam: See progress note 06/02/21 Vitals:   06/02/21 0828 06/02/21 1218  BP: 120/63 120/66  Pulse: 78 86  Resp: 17 19  Temp: 99.3 F (37.4 C)   SpO2: 94% 96%     Disposition: Discharge disposition: 01-Home or Self Care       Patient Instructions:  Allergies as of 06/02/2021       Reactions   Lisinopril Anaphylaxis, Swelling   Tongue swelling   Sulfamethoxazole-trimethoprim Other (See Comments)   Worked against diabetic medication   Farxiga [dapagliflozin]    diarrhea   Glipizide    Diarrhea   Metformin And Related    GI sx's diarrhea   Penicillins     Childhood   Tramadol Nausea Only, Nausea And Vomiting        Medication List     TAKE these medications    acetaminophen 500 MG tablet Commonly known as: TYLENOL Take 1,000 mg by mouth 2 (two) times daily as needed for moderate pain or mild pain. Rapid release   amLODipine 5 MG tablet Commonly known as: NORVASC Take 5 mg by mouth daily.   aspirin EC 81 MG tablet Take 1 tablet (81 mg total) by mouth daily. Swallow whole.   B-D UF III MINI PEN NEEDLES 31G X 5 MM Misc Generic drug: Insulin Pen Needle Inject 1 each into the skin daily.   clopidogrel 75 MG tablet Commonly known as: PLAVIX Take 75 mg by mouth daily.   ezetimibe 10 MG tablet Commonly known as: ZETIA Take 10 mg by mouth daily.   FreeStyle Libre 2 Sensor Misc by Does not apply route every 14 (fourteen) days.   glucose blood test strip Use 3 times daily before meals.   Lantus SoloStar 100 UNIT/ML Solostar Pen Generic drug: insulin glargine Inject 40 Units into the skin daily.   losartan 50 MG tablet Commonly known as: COZAAR Take 1 tablet (50 mg total) by mouth daily.   nebivolol 5 MG tablet Commonly known as: BYSTOLIC Take 1 tablet (5 mg total) by mouth daily. Dc tenoretic   omeprazole 40 MG capsule Commonly known as: PRILOSEC Take 40 mg by mouth daily.   ONE TOUCH ULTRA 2 w/Device Kit Used to check blood sugars daily.  OneTouch Delica Lancets Fine Misc 1 each by Does not apply route 4 (four) times daily as needed.   pravastatin 40 MG tablet Commonly known as: PRAVACHOL Take 1 tablet (40 mg total) by mouth daily. At night   sitaGLIPtin 100 MG tablet Commonly known as: Januvia Take 1 tablet (100 mg total) by mouth daily.   Trulicity 8.67 TV/9.8YS Sopn Generic drug: Dulaglutide Inject 0.75 mg into the skin once a week.   Vitamin D3 50 MCG (2000 UT) capsule Take 2,000 Units by mouth daily.   Xarelto 2.5 MG Tabs tablet Generic drug: rivaroxaban Take 2.5 mg by mouth 2 (two) times  daily.       Activity: activity as tolerated Diet: regular diet Wound Care: keep wound clean and dry  Follow-up with [facility] in 5 weeks.  Signed: Dagoberto Ligas, PA-C 06/10/2021 3:19 PM [facility] Office: [phone]

## 2021-06-17 ENCOUNTER — Other Ambulatory Visit: Payer: Self-pay | Admitting: *Deleted

## 2021-06-17 DIAGNOSIS — I70213 Atherosclerosis of native arteries of extremities with intermittent claudication, bilateral legs: Secondary | ICD-10-CM

## 2021-06-17 DIAGNOSIS — I739 Peripheral vascular disease, unspecified: Secondary | ICD-10-CM

## 2021-06-18 ENCOUNTER — Other Ambulatory Visit: Payer: Self-pay

## 2021-06-18 ENCOUNTER — Ambulatory Visit (HOSPITAL_COMMUNITY): Payer: Medicare PPO | Attending: Cardiovascular Disease

## 2021-06-18 DIAGNOSIS — R0602 Shortness of breath: Secondary | ICD-10-CM | POA: Insufficient documentation

## 2021-06-18 LAB — ECHOCARDIOGRAM COMPLETE
Area-P 1/2: 3.06 cm2
S' Lateral: 1.5 cm

## 2021-06-27 ENCOUNTER — Telehealth: Payer: Medicare PPO | Admitting: Nurse Practitioner

## 2021-06-27 DIAGNOSIS — B9689 Other specified bacterial agents as the cause of diseases classified elsewhere: Secondary | ICD-10-CM

## 2021-06-27 DIAGNOSIS — J329 Chronic sinusitis, unspecified: Secondary | ICD-10-CM

## 2021-06-27 MED ORDER — DOXYCYCLINE HYCLATE 100 MG PO TABS
100.0000 mg | ORAL_TABLET | Freq: Two times a day (BID) | ORAL | 0 refills | Status: AC
Start: 2021-06-27 — End: 2021-07-04

## 2021-06-27 NOTE — Progress Notes (Signed)
Virtual Visit Consent   [name], you are scheduled for a virtual visit with a [facility] provider today.     Just as with appointments in the office, your consent must be obtained to participate.  Your consent will be active for this visit and any virtual visit you may have with one of our providers in the next 365 days.     If you have a MyChart account, a copy of this consent can be sent to you electronically.  All virtual visits are billed to your insurance company just like a traditional visit in the office.    As this is a virtual visit, video technology does not allow for your provider to perform a traditional examination.  This may limit your provider's ability to fully assess your condition.  If your provider identifies any concerns that need to be evaluated in person or the need to arrange testing (such as labs, EKG, etc.), we will make arrangements to do so.     Although advances in technology are sophisticated, we cannot ensure that it will always work on either your end or our end.  If the connection with a video visit is poor, the visit may have to be switched to a telephone visit.  With either a video or telephone visit, we are not always able to ensure that we have a secure connection.     I need to obtain your verbal consent now.   Are you willing to proceed with your visit today?    Natalie Watts has provided verbal consent on 06/27/2021 for a virtual visit (video or telephone).   Natalie Pounds, NP   Date: 06/27/2021 9:36 AM   Virtual Visit via Video Note   I, Natalie Watts, connected with  Natalie Watts  ([id number], 1946-06-08) on 06/27/21 at 10:00 AM EST by a video-enabled telemedicine application and verified that I am speaking with the correct person using two identifiers.  Location: Patient: Virtual Visit Location Patient: Home Provider: Virtual Visit Location Provider: Home Office   I discussed the limitations of evaluation and management by telemedicine  and the availability of in person appointments. The patient expressed understanding and agreed to proceed.    History of Present Illness: Natalie Watts is a 76 y.o. who identifies as a female who was assigned female at birth, and is being seen today for acute bacterial sinusitis  Patient complains of congestion, facial pain, nasal congestion, post nasal drip, productive cough with  white and mucoid sputum, sinus pressure, and sore throat. Onset of symptoms was 2 weeks ago, waxing and waning since that time.    Problems:  Patient Active Problem List   Diagnosis Date Noted   Chronic bursitis of left shoulder 05/27/2021   LVH (left ventricular hypertrophy) 05/27/2021   Tubular adenoma of colon 05/25/2021   Hypertension associated with diabetes ([place]) 05/25/2021   Chronic left shoulder pain 05/25/2021   Glaucoma of both eyes 05/25/2021   AKI (acute kidney injury) ([place]) 05/25/2021   ANA positive 05/24/2021   Onychomycosis 05/17/2021   CAD (coronary artery disease) 05/17/2021   Gout 05/17/2021   OSA on CPAP 05/17/2021   Right foot drop 05/17/2021   Gallstones 05/17/2021   Hiatal hernia 05/17/2021   Aortic atherosclerosis ([place]) 05/17/2021   Shoulder pain, right 05/08/2018   Cervicalgia 04/05/2018   Low back pain 04/05/2018   Allergic conjunctivitis 10/09/2017   Heart murmur 06/16/2017   Vitamin B12 deficiency 06/01/2017   History  of degenerative disc disease 06/01/2017   Carotid bruit 06/01/2017   Arthritis 01/24/2017   Candidiasis of skin 01/24/2017   Carpal tunnel syndrome 01/24/2017   Cervical radiculopathy 01/24/2017   Generalized osteoarthritis 01/24/2017   Gastroesophageal reflux disease without esophagitis 01/24/2017   Genital herpes simplex type 2 01/24/2017   Hyperlipidemia 01/24/2017   Impairment of balance 01/24/2017   Insomnia 01/24/2017   Lumbar radiculopathy 01/24/2017   Obesity 01/24/2017   Peripheral vascular disease ([place]) 01/24/2017   Vitamin D deficiency  01/24/2017   Osteoarthritis of knee 01/16/2017   Synovitis of ankle 12/15/2016   PAD (peripheral artery disease) ([place]) 03/10/2016   Malignant tumor of colon ([place]) 12/26/2014   Dysplastic polyp of colon 12/25/2014   GERD (gastroesophageal reflux disease) 11/30/2014   Screening for colorectal cancer 11/30/2014   CKD (chronic kidney disease) stage 3, GFR 30-59 ml/min (HCC) 11/07/2014   Cervical spondylosis without myelopathy 06/05/2014   Primary osteoarthritis of first carpometacarpal joint of left hand 06/05/2014   Cervical spondylosis with radiculopathy 06/05/2014   Diabetes ([place]) 04/16/2013   HTN (hypertension) 04/16/2013   Healthcare maintenance 04/16/2013   Left lumbar radiculitis 10/19/2012   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/2012    Allergies:  Allergies  Allergen Reactions   Lisinopril Anaphylaxis and Swelling     Tongue swelling    Sulfamethoxazole-Trimethoprim Other (See Comments)    Worked against diabetic medication   Farxiga [Dapagliflozin]     diarrhea   Glipizide     Diarrhea    Metformin And Related     GI sx's diarrhea   Penicillins     Childhood   Tramadol Nausea Only and Nausea And Vomiting   Medications:  Current Outpatient Medications:    doxycycline (VIBRA-TABS) 100 MG tablet, Take 1 tablet (100 mg total) by mouth 2 (two) times daily for 7 days., Disp: 14 tablet, Rfl: 0   acetaminophen (TYLENOL) 500 MG tablet, Take 1,000 mg by mouth 2 (two) times daily as needed for moderate pain or mild pain. Rapid release, Disp: , Rfl:    amLODipine (NORVASC) 5 MG tablet, Take 5 mg by mouth daily., Disp: , Rfl:    aspirin EC 81 MG tablet, Take 1 tablet (81 mg total) by mouth daily. Swallow whole., Disp: 150 tablet, Rfl: 2   B-D UF III MINI PEN NEEDLES 31G X 5 MM MISC, Inject 1 each into the skin daily., Disp: , Rfl:    Blood Glucose Monitoring Suppl (ONE TOUCH ULTRA 2) w/Device KIT, Used to check blood sugars daily., Disp: 1 each,  Rfl: 0   Cholecalciferol (VITAMIN D3) 50 MCG (2000 UT) capsule, Take 2,000 Units by mouth daily., Disp: , Rfl:    clopidogrel (PLAVIX) 75 MG tablet, Take 75 mg by mouth daily., Disp: , Rfl:    Continuous Blood Gluc Sensor (FREESTYLE LIBRE 2 SENSOR) MISC, by Does not apply route every 14 (fourteen) days., Disp: , Rfl:    Dulaglutide (TRULICITY) 9.15 AV/6.9VX SOPN, Inject 0.75 mg into the skin once a week., Disp: 3 mL, Rfl: 0   ezetimibe (ZETIA) 10 MG tablet, Take 10 mg by mouth daily., Disp: , Rfl:    glucose blood test strip, Use 3 times daily before meals., Disp: 300 each, Rfl: 4   LANTUS SOLOSTAR 100 UNIT/ML Solostar Pen, Inject 40 Units into the skin daily., Disp: 15 mL, Rfl: 11   losartan (COZAAR) 50 MG tablet, Take 1 tablet (50 mg total) by mouth daily., Disp: 90 tablet, Rfl:  3   nebivolol (BYSTOLIC) 5 MG tablet, Take 1 tablet (5 mg total) by mouth daily. Dc tenoretic, Disp: 90 tablet, Rfl: 3   omeprazole (PRILOSEC) 40 MG capsule, Take 40 mg by mouth daily., Disp: , Rfl:    ONETOUCH DELICA LANCETS FINE MISC, 1 each by Does not apply route 4 (four) times daily as needed., Disp: 100 each, Rfl: 3   pravastatin (PRAVACHOL) 40 MG tablet, Take 1 tablet (40 mg total) by mouth daily. At night, Disp: 90 tablet, Rfl: 3   sitaGLIPtin (JANUVIA) 100 MG tablet, Take 1 tablet (100 mg total) by mouth daily., Disp: 90 tablet, Rfl: 1   XARELTO 2.5 MG TABS tablet, Take 2.5 mg by mouth 2 (two) times daily., Disp: , Rfl:   Observations/Objective: Patient is well-developed, well-nourished in no acute distress.  Resting comfortably at home.  Head is normocephalic, atraumatic.  No labored breathing.  Speech is clear and coherent with logical content.  Patient is alert and oriented at baseline.    Assessment and Plan: 1. Bacterial sinusitis - doxycycline (VIBRA-TABS) 100 MG tablet; Take 1 tablet (100 mg total) by mouth 2 (two) times daily for 7 days.  Dispense: 14 tablet; Refill: 0 INSTRUCTIONS: use a  humidifier for nasal congestion Drink plenty of fluids, rest and wash hands frequently to avoid the spread of infection Alternate tylenol and Motrin for relief of fever   Follow Up Instructions: I discussed the assessment and treatment plan with the patient. The patient was provided an opportunity to ask questions and all were answered. The patient agreed with the plan and demonstrated an understanding of the instructions.  A copy of instructions were sent to the patient via MyChart unless otherwise noted below.    The patient was advised to call back or seek an in-person evaluation if the symptoms worsen or if the condition fails to improve as anticipated.  Time:  I spent 10 minutes with the patient via telehealth technology discussing the above problems/concerns.    Natalie Pounds, NP

## 2021-06-27 NOTE — Patient Instructions (Addendum)
Natalie Watts, thank you for joining Gildardo Pounds, NP for today's virtual visit.  While this provider is not your primary care provider (PCP), if your PCP is located in our provider database this encounter information will be shared with them immediately following your visit.  Consent: (Patient) Natalie Watts provided verbal consent for this virtual visit at the beginning of the encounter.  Current Medications:  Current Outpatient Medications:    doxycycline (VIBRA-TABS) 100 MG tablet, Take 1 tablet (100 mg total) by mouth 2 (two) times daily for 7 days., Disp: 14 tablet, Rfl: 0   acetaminophen (TYLENOL) 500 MG tablet, Take 1,000 mg by mouth 2 (two) times daily as needed for moderate pain or mild pain. Rapid release, Disp: , Rfl:    amLODipine (NORVASC) 5 MG tablet, Take 5 mg by mouth daily., Disp: , Rfl:    aspirin EC 81 MG tablet, Take 1 tablet (81 mg total) by mouth daily. Swallow whole., Disp: 150 tablet, Rfl: 2   B-D UF III MINI PEN NEEDLES 31G X 5 MM MISC, Inject 1 each into the skin daily., Disp: , Rfl:    Blood Glucose Monitoring Suppl (ONE TOUCH ULTRA 2) w/Device KIT, Used to check blood sugars daily., Disp: 1 each, Rfl: 0   Cholecalciferol (VITAMIN D3) 50 MCG (2000 UT) capsule, Take 2,000 Units by mouth daily., Disp: , Rfl:    clopidogrel (PLAVIX) 75 MG tablet, Take 75 mg by mouth daily., Disp: , Rfl:    Continuous Blood Gluc Sensor (FREESTYLE LIBRE 2 SENSOR) MISC, by Does not apply route every 14 (fourteen) days., Disp: , Rfl:    Dulaglutide (TRULICITY) 6.16 WV/3.7TG SOPN, Inject 0.75 mg into the skin once a week., Disp: 3 mL, Rfl: 0   ezetimibe (ZETIA) 10 MG tablet, Take 10 mg by mouth daily., Disp: , Rfl:    glucose blood test strip, Use 3 times daily before meals., Disp: 300 each, Rfl: 4   LANTUS SOLOSTAR 100 UNIT/ML Solostar Pen, Inject 40 Units into the skin daily., Disp: 15 mL, Rfl: 11   losartan (COZAAR) 50 MG tablet, Take 1 tablet (50 mg total) by mouth daily., Disp: 90  tablet, Rfl: 3   nebivolol (BYSTOLIC) 5 MG tablet, Take 1 tablet (5 mg total) by mouth daily. Dc tenoretic, Disp: 90 tablet, Rfl: 3   omeprazole (PRILOSEC) 40 MG capsule, Take 40 mg by mouth daily., Disp: , Rfl:    ONETOUCH DELICA LANCETS FINE MISC, 1 each by Does not apply route 4 (four) times daily as needed., Disp: 100 each, Rfl: 3   pravastatin (PRAVACHOL) 40 MG tablet, Take 1 tablet (40 mg total) by mouth daily. At night, Disp: 90 tablet, Rfl: 3   sitaGLIPtin (JANUVIA) 100 MG tablet, Take 1 tablet (100 mg total) by mouth daily., Disp: 90 tablet, Rfl: 1   XARELTO 2.5 MG TABS tablet, Take 2.5 mg by mouth 2 (two) times daily., Disp: , Rfl:    Medications ordered in this encounter:  Meds ordered this encounter  Medications   doxycycline (VIBRA-TABS) 100 MG tablet    Sig: Take 1 tablet (100 mg total) by mouth 2 (two) times daily for 7 days.    Dispense:  14 tablet    Refill:  0    Order Specific Question:   Supervising Provider    Answer:   Sabra Heck, BRIAN [[id number]]     *If you need refills on other medications prior to your next appointment, please contact your pharmacy*  Follow-Up: Call  back or seek an in-person evaluation if the symptoms worsen or if the condition fails to improve as anticipated.  Other Instructions CONTINUE CORICIDIN HBP for cough INSTRUCTIONS: use a humidifier for nasal congestion Drink plenty of fluids, rest and wash hands frequently to avoid the spread of infection Alternate tylenol and Motrin for relief of fever    If you have been instructed to have an in-person evaluation today at a local Urgent Care facility, please use the link below. It will take you to a list of all of our available [facility], including address, phone number and hours of operation. Please do not delay care.  [facility]  If you or a family member do not have a primary care provider, use the link below to schedule a visit and establish care. When you choose a [facility] primary care physician or advanced practice provider, you gain a long-term partner in health. Find a Primary Care Provider  Learn more about [facility] in-office and virtual care options: [facility] .

## 2021-06-29 ENCOUNTER — Telehealth: Payer: Self-pay | Admitting: *Deleted

## 2021-06-29 MED ORDER — EMPAGLIFLOZIN 10 MG PO TABS: 10.0000 mg | ORAL_TABLET | Freq: Every day | ORAL | 11 refills | Status: DC

## 2021-06-29 NOTE — Telephone Encounter (Signed)
Spoke with pt and reviewed results and recommendations.  Pt willing to try Jardiance.  She will be in town Monday for an appt and will come by the office to pick up samples.  Pt aware to call or send Mychart message 2-3 weeks after starting medication and let us know how she is doing.  Pt appreciative for call. Spoke with Dr. Tamala Julian due to Wilder Glade being listed as an allergy due to diarrhea.  He would like for pt to try Jardiance anyway.  Pt aware to call sooner than 2-3 weeks if any issues.

## 2021-06-29 NOTE — Telephone Encounter (Signed)
-----   Message from Belva Crome, MD sent at 06/26/2021 12:13 PM EST ----- Let the patient know the echo suggests SOB/DOE is possibly related to diastolic dysfunction. Try Jardiance 10 mg daily. 2-3 week f/u phone call to see if breathing improving. A copy will be sent to McLean-Scocuzza, Nino Glow, MD

## 2021-07-05 ENCOUNTER — Other Ambulatory Visit: Payer: Self-pay

## 2021-07-05 ENCOUNTER — Ambulatory Visit (INDEPENDENT_AMBULATORY_CARE_PROVIDER_SITE_OTHER): Payer: Medicare PPO | Admitting: Physician Assistant

## 2021-07-05 ENCOUNTER — Encounter: Payer: Self-pay | Admitting: Physician Assistant

## 2021-07-05 ENCOUNTER — Telehealth: Payer: Self-pay | Admitting: Interventional Cardiology

## 2021-07-05 ENCOUNTER — Ambulatory Visit (INDEPENDENT_AMBULATORY_CARE_PROVIDER_SITE_OTHER)
Admission: RE | Admit: 2021-07-05 | Discharge: 2021-07-05 | Disposition: A | Discharge status: Home / Self Care | Payer: Medicare PPO | Source: Ambulatory Visit | Attending: Surgery | Admitting: Surgery

## 2021-07-05 ENCOUNTER — Ambulatory Visit (HOSPITAL_COMMUNITY)
Admission: RE | Admit: 2021-07-05 | Discharge: 2021-07-05 | Disposition: A | Discharge status: Home / Self Care | Payer: Medicare PPO | Source: Ambulatory Visit | Attending: Surgery | Admitting: Surgery

## 2021-07-05 ENCOUNTER — Telehealth: Payer: Self-pay | Admitting: Internal Medicine

## 2021-07-05 VITALS — BP 135/66 | HR 67 | Temp 98.3°F | Resp 20 | Ht 61.0 in | Wt 164.7 lb

## 2021-07-05 DIAGNOSIS — I739 Peripheral vascular disease, unspecified: Secondary | ICD-10-CM

## 2021-07-05 DIAGNOSIS — M79652 Pain in left thigh: Secondary | ICD-10-CM

## 2021-07-05 DIAGNOSIS — I70213 Atherosclerosis of native arteries of extremities with intermittent claudication, bilateral legs: Secondary | ICD-10-CM | POA: Insufficient documentation

## 2021-07-05 NOTE — Progress Notes (Signed)
HISTORY AND PHYSICAL     CC:  follow up. Requesting Provider:  McLean-Scocuzza, Olivia Mackie *  HPI: This is a 76 y.o. female who is here today for follow up for PAD.  She was first seen by Dr. Trula Slade on 05/24/2021 to establish vascular care.   In December 2020 in [place] she underwent placement of bilateral superficial femoral artery stenting.  She moved to [place] and in March 2021 she underwent angioplasty of in-stent stenosis on the left.  She had subsequently gone to occlude the stents in her left leg.   Given her claudication, she was taken to the [facility] lab on 06/01/2021 and underwent aortogram with stenting to the right SFA. Her left SFA and proximal popliteal artery was occluded. She was kept overnight due to hematoma in the left groin.  U/s revealed no evidence of psa, AVF or DVT.  She was discharged home the following morning.   Patient has foot drop in her right leg secondary to a low back issue.  She has a history of DVT on the left in 2014.  She suffers from chronic stage III renal insufficiency.   The pt returns today for follow up and here with her niece.  She states that her right leg is feeling fine.  She states that she has some soreness around the medial aspect of the left leg at the knee.  This has been present for some time.  She does not really have pain in her foot or have any claudication sx.  She states she is diabetic but does have feeling in her feet.  She does not have any non healing wounds.  She states the bruising she had after the procedure has resolved.   The pt is on a statin for cholesterol management.    The pt is on an aspirin.    Other AC:  Plavix The pt is on CCB, BB, ARB for hypertension.  The pt does have diabetes. Tobacco hx:  former  Pt does not have family hx of AAA.  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 Loann Quill [facility]    COVID 19+    mid 11/2020   Degenerative disk disease    Diabetes ([place])    DVT (deep venous thrombosis) ([place])    DVT, lower extremity ([place])    left, 05/2013 on Xarelto x 1 month etiology unkown    GERD (gastroesophageal reflux disease)    Gout    H/O myocardial perfusion scan    06/08/20 low risk   Hammer toes, bilateral    HSV-2 infection    Hyperlipidemia    Hypertension    Insomnia    Intertrigo    Lumbar radiculopathy    improved after steroid inj and PT 10/21/20 had right L4/5 L5-S1 transforaminal epidural steroid injections   Onychomycosis    OSA on CPAP    dx in  2020 after hip surgery CPAP 9-13 cm H20 supplies [employer] PSG 03/18/2019 severe OSA AHI 42.1/hr titration 03/27/2019 rec APAP 9-13 cm H20   PAD (peripheral artery disease) (HCC)    1 x right, 3 x left in 2020-2022 needs bypass on leg had CTA with run off 11/2020 in [place]   PAD (peripheral artery disease) (HCC)    with stents   Peripheral vascular disease ([place])    Right foot drop    since 2020   Subdeltoid bursitis    Tinea pedis    Vitamin D deficiency     Past Surgical History:  Procedure Laterality Date   ABDOMINAL AORTOGRAM W/LOWER EXTREMITY N/A 06/01/2021   Procedure: ABDOMINAL AORTOGRAM W/LOWER EXTREMITY;  Surgeon: Serafina Mitchell, MD;  Location: [place] [facility];  Service: Cardiovascular;  Laterality: N/A;   BACK SURGERY     cervical laminoplasty C3-C7 Rex Dr. 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  Edwena Bunde [place] 12 mm d colon polyp, grade 1 IH, moderate diverticulosis tubular   ESOPHAGOGASTRODUODENOSCOPY  05/01/2020   erosive gastritis Dr. Tilford Pillar [place]   PERIPHERAL VASCULAR INTERVENTION  06/01/2021   Procedure: PERIPHERAL VASCULAR INTERVENTION;  Surgeon: Serafina Mitchell, MD;  Location: [place] [facility];  Service: Cardiovascular;;  RT. SFA   right total hip Right    03/05/2019 or 02/26/2020   TUBAL LIGATION     UPPER GI ENDOSCOPY     05/01/20   VASCULAR SURGERY     08/19/19   VASCULAR SURGERY     07/05/19   VASCULAR SURGERY     05/21/2019 angioplasty since 05/2019, 06/2019 and 08/2019 had 2 left leg and 1 on right leg    Allergies  Allergen Reactions   Lisinopril Anaphylaxis and Swelling     Tongue swelling    Sulfamethoxazole-Trimethoprim Other (See Comments)    Worked against diabetic medication   Farxiga [Dapagliflozin]     diarrhea   Glipizide     Diarrhea    Metformin And Related     GI sx's diarrhea   Penicillins     Childhood   Tramadol Nausea Only and Nausea And Vomiting    Current Outpatient Medications  Medication Sig Dispense Refill   acetaminophen (TYLENOL) 500 MG tablet Take 1,000 mg by mouth 2 (two) times daily as needed for moderate pain or mild pain. Rapid release     amLODipine (NORVASC) 5 MG tablet Take 5 mg by mouth daily.     aspirin EC 81 MG tablet Take 1 tablet (81 mg total) by mouth daily. Swallow whole. 150 tablet 2   B-D UF III MINI PEN NEEDLES 31G X 5 MM MISC Inject 1 each into the skin daily.     Blood Glucose Monitoring Suppl (ONE TOUCH ULTRA 2) w/Device KIT Used to check blood sugars daily. 1 each 0   Cholecalciferol (VITAMIN D3) 50 MCG (2000 UT) capsule Take 2,000 Units by mouth daily.     clopidogrel (PLAVIX) 75 MG tablet Take 75 mg by mouth daily.     Continuous Blood Gluc Sensor (FREESTYLE LIBRE 2 SENSOR) MISC by Does not apply route every 14 (fourteen) days.     Dulaglutide (TRULICITY) 2.37 SE/8.3TD SOPN Inject 0.75 mg  into the skin once a week. 3 mL 0   empagliflozin (JARDIANCE) 10 MG TABS tablet Take 1 tablet (10  mg total) by mouth daily before breakfast. 30 tablet 11   ezetimibe (ZETIA) 10 MG tablet Take 10 mg by mouth daily.     glucose blood test strip Use 3 times daily before meals. 300 each 4   LANTUS SOLOSTAR 100 UNIT/ML Solostar Pen Inject 40 Units into the skin daily. 15 mL 11   losartan (COZAAR) 50 MG tablet Take 1 tablet (50 mg total) by mouth daily. 90 tablet 3   nebivolol (BYSTOLIC) 5 MG tablet Take 1 tablet (5 mg total) by mouth daily. Dc tenoretic 90 tablet 3   omeprazole (PRILOSEC) 40 MG capsule Take 40 mg by mouth daily.     ONETOUCH DELICA LANCETS FINE MISC 1 each by Does not apply route 4 (four) times daily as needed. 100 each 3   pravastatin (PRAVACHOL) 40 MG tablet Take 1 tablet (40 mg total) by mouth daily. At night 90 tablet 3   sitaGLIPtin (JANUVIA) 100 MG tablet Take 1 tablet (100 mg total) by mouth daily. 90 tablet 1   XARELTO 2.5 MG TABS tablet Take 2.5 mg by mouth 2 (two) times daily.     No current facility-administered medications for this visit.    Family History  Problem Relation Age of Onset   Hypertension Mother    Arthritis Mother    Cancer Father        colon cancer dx'ed died age 22   Hypertension Sister    Arthritis Sister    Cancer Sister        died in 44s brain tumor h/o lung cancer smoker died 08-07-15    Early death Sister    Breast cancer Maternal Grandmother    Cancer Maternal Grandmother        breast dx older age    Hypertension Maternal Grandmother    Breast cancer Other        maternal neice   Cancer Other        died in 70s    Hypertension Daughter    Hypertension Daughter     Social History   Socioeconomic History   Marital status: Divorced    Spouse name: Not on file   Number of children: Not on file   Years of education: Not on file   Highest education level: Not on file  Occupational History   Not on file  Tobacco Use   Smoking  status: Former    Packs/day: 1.00    Years: 45.00    Pack years: 45.00    Types: Cigarettes    Quit date: 12/18/2012    Years since quitting: 8.5   Smokeless tobacco: Never  Vaping Use   Vaping Use: Never used  Substance and Sexual Activity   Alcohol use: No   Drug use: Yes    Frequency: 3.0 times per week   Sexual activity: Not on file  Other Topics Concern   Not on file  Social History Narrative   Divorced    2 daughters [name] comes to most appts and is Chief Executive Officer    -lives with daughter [name]    Used to work for [employer] now [employer]    Former smoker age 45 to age 76/2012 quit for sure in Aug 06, 2012 max 1/2 ppd FH lung cancer sister also smoker    Social Determinants of Radio broadcast assistant Strain: Not on file  Food Insecurity: Not on file  Transportation Needs: Not on file  Physical Activity: Not on file  Stress: Not on file  Social Connections: Not on file  Intimate Partner Violence: Not on file     REVIEW OF SYSTEMS:   [X] denotes positive finding, [ ] denotes negative finding Cardiac  Comments:  Chest pain or chest pressure:    Shortness of breath upon exertion:    Short of breath when lying flat:    Irregular heart rhythm:        Vascular    Pain in calf, thigh, or hip brought on by ambulation:    Pain in feet at night that wakes you up from your sleep:     Blood clot in your veins:    Leg swelling:         Pulmonary    Oxygen at home:    Productive cough:     Wheezing:         Neurologic    Sudden weakness in arms or legs:     Sudden numbness in arms or legs:     Sudden onset of difficulty speaking or slurred speech:    Temporary loss of vision in one eye:     Problems with dizziness:         Gastrointestinal    Blood in stool:     Vomited blood:         Genitourinary    Burning when urinating:     Blood in urine:        Psychiatric    Major depression:         Hematologic    Bleeding problems:    Problems with blood  clotting too easily:        Skin    Rashes or ulcers:        Constitutional    Fever or chills:      PHYSICAL EXAMINATION:  Today's Vitals   07/05/21 1411  BP: 135/66  Pulse: 67  Resp: 20  Temp: 98.3 F (36.8 C)  TempSrc: Temporal  SpO2: 96%  Weight: 164 lb 11.2 oz (74.7 kg)  Height: 5' 1" (1.549 m)   Body mass index is 31.12 kg/m.   General:  WDWN in NAD; vital signs documented above Gait: Not observed HENT: WNL, normocephalic Pulmonary: normal non-labored breathing , without wheezing Cardiac: regular HR, without carotid bruits Abdomen: soft, NT, no masses; aortic pulse is not palpable Skin: without rashes Vascular Exam/Pulses:  Right Left  Radial 2+ (normal) 2+ (normal)  Femoral 2+ (normal) 2+ (normal)  Popliteal Unable to palpate Unable to palpate  DP Brisk doppler Monophasic doppler  PT Brisk doppler Monophasic doppler  Peroneal Brisk doppler Monophasic doppler   Extremities: without ischemic changes, without Gangrene , without cellulitis; without open wounds;  Musculoskeletal: no muscle wasting or atrophy  Neurologic: A&O X 3 Psychiatric:  The pt has Normal affect.   Non-Invasive Vascular Imaging:   ABI's/TBI's on 07/05/2021: Right:  1.21/0.92 - Great toe pressure: 135 Left:  0.60/0.58 - Great toe pressure: 84  RLE Arterial duplex on 07/05/2021: +----------+--------+-----+---------------+--------+--------+   RIGHT      PSV cm/s Ratio Stenosis        Waveform Comments   +----------+--------+-----+---------------+--------+--------+   CFA Mid    149                            biphasic            +----------+--------+-----+---------------+--------+--------+   DFA        53  biphasic            +----------+--------+-----+---------------+--------+--------+   SFA Prox   194            30-49% stenosis biphasic            +----------+--------+-----+---------------+--------+--------+   SFA Mid    103                             biphasic            +----------+--------+-----+---------------+--------+--------+   SFA Distal 58                             biphasic            +----------+--------+-----+---------------+--------+--------+   POP Prox   72                             biphasic            +----------+--------+-----+---------------+--------+--------+   Right Stent(s):  +---------------+--------+--------+--------+--------+   SFA             PSV cm/s Stenosis Waveform Comments   +---------------+--------+--------+--------+--------+   Prox to Stent   77                biphasic            +---------------+--------+--------+--------+--------+   Proximal Stent  81                biphasic            +---------------+--------+--------+--------+--------+   Mid Stent       87                biphasic            +---------------+--------+--------+--------+--------+   Distal Stent    66                biphasic            +---------------+--------+--------+--------+--------+   Distal to Stent 73                biphasic            +---------------+--------+--------+--------+--------+  Summary:  Right: 30-49% stenosis noted in the superficial femoral artery. Patent  right SFA stent with no evidence of stenosis.   Previous ABI's/TBI's on 05/20/2021: Right:  0.97/0.54 - Great toe pressure: 80 Left:  0.66/0.32 - Great toe pressure:  48  Previous arterial duplex on 05/19/2021: Summary:  Right: 50-74% stenosis noted in the superficial femoral artery.   Left: Total occlusion noted in the superficial femoral artery with distal  reconstitution of flow. Total occlusion noted in the posterior tibial  artery with collateral flow noted. Left superficial femoral artery stent  appears occluded.    ASSESSMENT/PLAN:: 76 y.o. female here for follow up for PAD and s/p underwent aortogram with stenting to the right SFA on 06/01/2021 by Dr. Trula Slade  -pt's right SFA stent is patent without stenosis on today's duplex.  Her ABI on  the right is normal with normal toe pressures.  There is a mild narrowing in the SFA.  -on the left, she has know occlusions of stents she had placed previously.  She does not have any rest pain or claudication symptoms or non healing wounds.  She does have tenderness around the medial aspect of the left  knee.  I do not appreciate any masses or irregularity around this area.   -Per Dr. Stephens Shire note, he would not consider intervention on the left leg unless she developed rest pain or non healing wounds and at that point, she would require a bypass.  Discussed this with pt and her niece.  Encouraged her to continue walking program.   -she will continue her asa/plavix/statin -pt will f/u in 6 months with RLE arterial duplex and ABI.   Leontine Locket, [facility] [phone]  Clinic MD:   Trula Slade

## 2021-07-05 NOTE — Telephone Encounter (Signed)
Left message for patient to continue on both medications. Advised to call back with any questions.

## 2021-07-05 NOTE — Telephone Encounter (Signed)
Pt need refill on JANUVIA 100 MG tablet sent [facility] [address]. Pt stated that her cardiologist wanted her to take the jardiance and pt want to know if she should take both of them

## 2021-07-05 NOTE — Telephone Encounter (Signed)
New Message:      Patient wants to know if she is supposed to take both Januvia and Jardiance?

## 2021-07-06 ENCOUNTER — Other Ambulatory Visit: Payer: Self-pay | Admitting: *Deleted

## 2021-07-06 ENCOUNTER — Encounter: Payer: Self-pay | Admitting: Internal Medicine

## 2021-07-06 DIAGNOSIS — I739 Peripheral vascular disease, unspecified: Secondary | ICD-10-CM

## 2021-07-06 DIAGNOSIS — I70213 Atherosclerosis of native arteries of extremities with intermittent claudication, bilateral legs: Secondary | ICD-10-CM

## 2021-07-07 ENCOUNTER — Other Ambulatory Visit: Payer: Self-pay | Admitting: Internal Medicine

## 2021-07-07 DIAGNOSIS — I152 Hypertension secondary to endocrine disorders: Secondary | ICD-10-CM

## 2021-07-07 DIAGNOSIS — E1159 Type 2 diabetes mellitus with other circulatory complications: Secondary | ICD-10-CM

## 2021-07-07 MED ORDER — BD PEN NEEDLE MINI U/F 31G X 5 MM MISC: 1.0000 | Freq: Every day | 3 refills | Status: DC

## 2021-07-07 NOTE — Telephone Encounter (Signed)
Januvia sent in 07/2018 by Dr Olivia Mackie McLean-Scocuzza. Jardiance sent in by other provider 06/2021.   Please advise, should Patient be on both medications?

## 2021-07-07 NOTE — Telephone Encounter (Signed)
If she starts Jardiance she can try off Januvia as Jardiance works better for sugar control  Hold refill for now inform patient  And call back if sugars <70 we may need to go down on other medications

## 2021-07-07 NOTE — Telephone Encounter (Signed)
Please advise 

## 2021-07-08 NOTE — Telephone Encounter (Signed)
Patient wanting to know if she should still be taking Omeprazole. Was placed on this medication by the provider that did her colonoscopy a year ago.   States she has been hearing about some bad side effects with this medication. States she has heard about it causing parkinson's. Wanting to know the side effects of this medication.    If needing to stay on the medication she will need a refill.

## 2021-07-08 NOTE — Telephone Encounter (Signed)
Patient informed and verbalized understanding.  Will check her blood sugars at home.

## 2021-07-08 NOTE — Telephone Encounter (Signed)
It is for heartburn/stomach irritation or ulcer if not having any of this and if they looked into her stomach and no issues can stop this  Otherwise continue the medication

## 2021-07-09 NOTE — Telephone Encounter (Signed)
Patient informed and verbalized understanding

## 2021-07-09 NOTE — Telephone Encounter (Signed)
Me      2:35 PM Note Patient informed and verbalized understanding.  Will check her blood sugars at home.      July 07, 2021 McLean-Scocuzza, Nino Glow, MD to Me    TM    4:59 PM Note If she starts Jardiance she can try off Januvia as Jardiance works better for sugar control  Hold refill for now inform patient  And call back if sugars <70 we may need to go down on other medications        Me to McLean-Scocuzza, Nino Glow, MD      1:27 PM Note Januvia sent in 07/2018 by Dr Olivia Mackie McLean-Scocuzza. Jardiance sent in by other provider 06/2021.    Please advise, should Patient be on both medications?

## 2021-07-20 ENCOUNTER — Ambulatory Visit
Admission: RE | Admit: 2021-07-20 | Discharge: 2021-07-20 | Disposition: A | Discharge status: Home / Self Care | Payer: Medicare PPO | Source: Ambulatory Visit | Attending: Internal Medicine | Admitting: Internal Medicine

## 2021-07-20 ENCOUNTER — Other Ambulatory Visit: Payer: Self-pay

## 2021-07-20 DIAGNOSIS — Z1231 Encounter for screening mammogram for malignant neoplasm of breast: Secondary | ICD-10-CM | POA: Diagnosis present

## 2021-07-22 ENCOUNTER — Other Ambulatory Visit: Payer: Self-pay | Admitting: *Deleted

## 2021-07-22 ENCOUNTER — Inpatient Hospital Stay
Admission: RE | Admit: 2021-07-22 | Discharge: 2021-07-22 | Disposition: A | Discharge status: Home / Self Care | Payer: Self-pay | Source: Ambulatory Visit | Attending: *Deleted | Admitting: *Deleted

## 2021-07-22 DIAGNOSIS — Z1231 Encounter for screening mammogram for malignant neoplasm of breast: Secondary | ICD-10-CM

## 2021-07-23 ENCOUNTER — Encounter: Payer: Self-pay | Admitting: Internal Medicine

## 2021-07-23 ENCOUNTER — Other Ambulatory Visit: Payer: Self-pay

## 2021-07-23 ENCOUNTER — Ambulatory Visit: Payer: Medicare PPO | Admitting: Internal Medicine

## 2021-07-23 ENCOUNTER — Ambulatory Visit (INDEPENDENT_AMBULATORY_CARE_PROVIDER_SITE_OTHER): Payer: Medicare PPO

## 2021-07-23 VITALS — BP 118/68 | HR 89 | Temp 98.7°F | Ht 61.0 in | Wt 162.2 lb

## 2021-07-23 DIAGNOSIS — Z9989 Dependence on other enabling machines and devices: Secondary | ICD-10-CM

## 2021-07-23 DIAGNOSIS — I10 Essential (primary) hypertension: Secondary | ICD-10-CM | POA: Diagnosis not present

## 2021-07-23 DIAGNOSIS — G8929 Other chronic pain: Secondary | ICD-10-CM

## 2021-07-23 DIAGNOSIS — E1159 Type 2 diabetes mellitus with other circulatory complications: Secondary | ICD-10-CM | POA: Diagnosis not present

## 2021-07-23 DIAGNOSIS — F4321 Adjustment disorder with depressed mood: Secondary | ICD-10-CM

## 2021-07-23 DIAGNOSIS — N183 Chronic kidney disease, stage 3 unspecified: Secondary | ICD-10-CM

## 2021-07-23 DIAGNOSIS — E1165 Type 2 diabetes mellitus with hyperglycemia: Secondary | ICD-10-CM

## 2021-07-23 DIAGNOSIS — M25562 Pain in left knee: Secondary | ICD-10-CM

## 2021-07-23 DIAGNOSIS — G4733 Obstructive sleep apnea (adult) (pediatric): Secondary | ICD-10-CM

## 2021-07-23 DIAGNOSIS — I739 Peripheral vascular disease, unspecified: Secondary | ICD-10-CM

## 2021-07-23 DIAGNOSIS — I152 Hypertension secondary to endocrine disorders: Secondary | ICD-10-CM

## 2021-07-23 DIAGNOSIS — E119 Type 2 diabetes mellitus without complications: Secondary | ICD-10-CM

## 2021-07-23 DIAGNOSIS — Z794 Long term (current) use of insulin: Secondary | ICD-10-CM

## 2021-07-23 DIAGNOSIS — E1122 Type 2 diabetes mellitus with diabetic chronic kidney disease: Secondary | ICD-10-CM | POA: Diagnosis not present

## 2021-07-23 DIAGNOSIS — H9193 Unspecified hearing loss, bilateral: Secondary | ICD-10-CM

## 2021-07-23 DIAGNOSIS — Z0100 Encounter for examination of eyes and vision without abnormal findings: Secondary | ICD-10-CM

## 2021-07-23 DIAGNOSIS — R0982 Postnasal drip: Secondary | ICD-10-CM

## 2021-07-23 HISTORY — DX: Other chronic pain: G89.29

## 2021-07-23 HISTORY — DX: Adjustment disorder with depressed mood: F43.21

## 2021-07-23 MED ORDER — GLUCOSE BLOOD VI STRP: ORAL_STRIP | 4 refills | Status: DC

## 2021-07-23 MED ORDER — FREESTYLE LIBRE 3 SENSOR MISC: 1.0000 | Freq: Two times a day (BID) | 11 refills | Status: DC

## 2021-07-23 MED ORDER — LANCETS ULTRA FINE MISC: 1.0000 | Freq: Two times a day (BID) | 3 refills | Status: AC | PRN

## 2021-07-23 MED ORDER — BD PEN NEEDLE MINI U/F 31G X 5 MM MISC: 1.0000 | Freq: Two times a day (BID) | 3 refills | Status: DC

## 2021-07-23 MED ORDER — FREESTYLE LIBRE 2 READER DEVI: 1.0000 | 0 refills | Status: DC

## 2021-07-23 NOTE — Patient Instructions (Addendum)
ENT   No Physician   Primary Contact Information  Phone Fax E-mail Address  [phone] Not available [address] [place] [address]     Specialties       [facility]  Address: [address], [place], [place] [address] Hours:  [place] Born soon ? 9?AM Phone: [phone]     [facility] [place]  [address]  [place] [address]  [phone]    [facility] as given the info before for both  [facility] [place]  [place] [address] [phone]     [facility] when ready    Phone Fax E-mail Address  [phone] Not available [address]    [place] [address]     Specialties     Ophthalmology        Aspartame is bad use stevia or splenda   In am sugar 70-140 too low <70 glucose tablets  Hypoglycemia Hypoglycemia occurs when the level of sugar (glucose) in the blood is too low. Hypoglycemia can happen in people who have or do not have diabetes. It can develop quickly, and it can be a medical emergency. For most people, a blood glucose level below 70 mg/dL (3.9 mmol/L) is considered hypoglycemia. Glucose is a type of sugar that provides the body's main source of energy. Certain hormones (insulin and glucagon) control the level of glucose in the blood. Insulin lowers blood glucose, and glucagon raises blood glucose. Hypoglycemia can result from having too much insulin in the bloodstream, or from not eating enough food that contains glucose. You may also have reactive hypoglycemia, which happens within 4 hours after eating a meal. What are the causes? Hypoglycemia occurs most often in people who have diabetes and may be caused by: Diabetes medicine. Not eating enough, or not eating often enough. Increased physical activity. Drinking alcohol on an empty stomach. If you do not have diabetes, hypoglycemia may be caused  by: A tumor in the pancreas. Not eating enough, or not eating for long periods at a time (fasting). A severe infection or illness. Problems after having bariatric surgery. Organ failure, such as kidney or liver failure. Certain medicines. What increases the risk? Hypoglycemia is more likely to develop in people who: Have diabetes and take medicines to lower blood glucose. Abuse alcohol. Have a severe illness. What are the signs or symptoms? Symptoms vary depending on whether the condition is mild, moderate, or severe. Mild hypoglycemia Hunger. Sweating and feeling clammy. Dizziness or feeling light-headed. Sleepiness or restless sleep. Nausea. Increased heart rate. Headache. Blurry vision. Mood changes, such as irritability or anxiety. Tingling or numbness around the mouth, lips, or tongue. Moderate hypoglycemia Confusion and poor judgment. Behavior changes. Weakness. Irregular heartbeat. A change in coordination. Severe hypoglycemia Severe hypoglycemia is a medical emergency. It can cause: Fainting. Seizures. Loss of consciousness (coma). Death. How is this diagnosed? Hypoglycemia is diagnosed with a blood test to measure your blood glucose level. This blood test is done while you are having symptoms. Your health care provider may also do a physical exam and review your medical history. How is this treated? This condition can be treated by immediately eating or drinking something that contains sugar with 15 grams of fast-acting carbohydrate, such as: 4 oz (120 mL) of fruit juice. 4 oz (120 mL) of regular soda (not diet soda). Several pieces of hard candy. Check food labels to  find out how many pieces to eat for 15 grams. 1 Tbsp (15 mL) of sugar or honey. 4 glucose tablets. 1 tube of glucose gel. Treating hypoglycemia if you have diabetes If you are alert and able to swallow safely, follow the 15:15 rule: Take 15 grams of a fast-acting carbohydrate. Talk with your  health care provider about how much you should take. Options for getting 15 grams of fast-acting carbohydrate include: Glucose tablets (take 4 tablets). Several pieces of hard candy. Check food labels to find out how many pieces to eat for 15 grams. 4 oz (120 mL) of fruit juice. 4 oz (120 mL) of regular soda (not diet soda). 1 Tbsp (15 mL) of sugar or honey. 1 tube of glucose gel. Check your blood glucose 15 minutes after you take the carbohydrate. If the repeat blood glucose level is still at or below 70 mg/dL (3.9 mmol/L), take 15 grams of a carbohydrate again. If your blood glucose level does not increase above 70 mg/dL (3.9 mmol/L) after 3 tries, seek emergency medical care. After your blood glucose level returns to normal, eat a meal or a snack within 1 hour.  Treating severe hypoglycemia Severe hypoglycemia is when your blood glucose level is below 54 mg/dL (3 mmol/L). Severe hypoglycemia is a medical emergency. Get medical help right away. If you have severe hypoglycemia and you cannot eat or drink, you will need to be given glucagon. A family member or close friend should learn how to check your blood glucose and how to give you glucagon. Ask your health care provider if you need to have an emergency glucagon kit available. Severe hypoglycemia may need to be treated in a hospital. The treatment may include getting glucose through an IV. You may also need treatment for the cause of your hypoglycemia. Follow these instructions at home: General instructions Take over-the-counter and prescription medicines only as told by your health care provider. Monitor your blood glucose as told by your health care provider. If you drink alcohol: Limit how much you have to: 0-1 drink a day for women who are not pregnant. 0-2 drinks a day for men. Know how much alcohol is in your drink. In the [place] one drink equals one 12 oz bottle of beer (355 mL), one 5 oz glass of wine (148 mL), or one 1 oz glass  of hard liquor (44 mL). Be sure to eat food along with drinking alcohol. Be aware that alcohol is absorbed quickly and may have lingering effects that may result in hypoglycemia later. Be sure to do ongoing glucose monitoring. Keep all follow-up visits. This is important. If you have diabetes: Always have a fast-acting carbohydrate (15 grams) option with you to treat low blood glucose. Follow your diabetes management plan as directed by your health care provider. Make sure you: Know the symptoms of hypoglycemia. It is important to treat it right away to prevent it from becoming severe. Check your blood glucose as often as told. Always check before and after exercise. Always check your blood glucose before you drive a motorized vehicle. Take your medicines as told. Follow your meal plan. Eat on time, and do not skip meals. Share your diabetes management plan with people in your workplace, school, and household. Carry a medical alert card or wear medical alert jewelry. Where to find more information American Diabetes Association: www.diabetes.org Contact a health care provider if: You have problems keeping your blood glucose in your target range. You have frequent episodes of hypoglycemia. Get  help right away if: You continue to have hypoglycemia symptoms after eating or drinking something that contains 15 grams of fast-acting carbohydrate, and you cannot get your blood glucose above 70 mg/dL (3.9 mmol/L) while following the 15:15 rule. Your blood glucose is below 54 mg/dL (3 mmol/L). You have a seizure. You faint. These symptoms may represent a serious problem that is an emergency. Do not wait to see if the symptoms will go away. Get medical help right away. Call your local emergency services (911 in the [place] Do not drive yourself to the hospital. Summary Hypoglycemia occurs when the level of sugar (glucose) in the blood is too low. Hypoglycemia can happen in people who have or do not  have diabetes. It can develop quickly, and it can be a medical emergency. Make sure you know the symptoms of hypoglycemia and how to treat it. Always have a fast-acting carbohydrate option with you to treat low blood sugar. This information is not intended to replace advice given to you by your health care provider. Make sure you discuss any questions you have with your health care provider. Document Revised: 05/07/2020 Document Reviewed: 05/07/2020 Elsevier Patient Education  2022 [employer].

## 2021-07-23 NOTE — Progress Notes (Signed)
Chief Complaint  Patient presents with   Follow-up   F/u with daughter  1. DM 2 on jardiance 10 tolerating, on insulin, trulicity doing well wants DM glucose meter supplies and freestyle libre [facility] eye exam in future refer [facility] will be due 02/2022  2. Daughter c/w hearing loss and osa on cpap with PND will have pt see ENT She may need to see pulm in the future 3. C/o  left medial knee pain x months xray today and Rx new handicap tag for [place] hers is for [place]  4. PAD s/p A-gram with stent and right SFO 06/01/21 Dr. Trula Slade doing well trc in 6 months for RLE arterial duplex and ABI  She did have hematoma compliaction after the procedure   Review of Systems  Constitutional:  Negative for weight loss.  HENT:  Negative for hearing loss.   Eyes:  Negative for blurred vision.  Respiratory:  Negative for shortness of breath.   Cardiovascular:  Negative for chest pain.  Gastrointestinal:  Negative for abdominal pain and blood in stool.  Genitourinary:  Negative for dysuria.  Musculoskeletal:  Positive for joint pain. Negative for falls.  Skin:  Negative for rash.  Neurological:  Negative for headaches.  Psychiatric/Behavioral:  Negative for depression.   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 Loann Quill [facility]    COVID 19+    mid 11/2020   Degenerative disk disease    Diabetes ([place])    DVT (deep venous thrombosis) ([place])    DVT, lower extremity ([place])    left, 05/2013  on Xarelto x 1 month etiology unkown    GERD (gastroesophageal reflux disease)    Gout    H/O myocardial perfusion scan    06/08/20 low risk   Hammer toes, bilateral    HSV-2 infection    Hyperlipidemia    Hypertension    Insomnia    Intertrigo    Lumbar radiculopathy    improved after steroid inj and PT 10/21/20 had right L4/5 L5-S1 transforaminal epidural steroid injections   Onychomycosis    OSA on CPAP    dx in 2020 after hip surgery CPAP 9-13 cm H20 supplies [facility] PSG 03/18/2019 severe OSA AHI 42.1/hr titration 03/27/2019 rec APAP 9-13 cm H20   PAD (peripheral artery disease) ([place])    1 x right, 3 x left in 2020-2022 needs bypass on leg had CTA with run off 11/2020 in [place]   PAD (peripheral artery disease) (HCC)    with stents   Peripheral vascular disease ([place])    Right foot drop    since 2020   Subdeltoid bursitis    Tinea pedis    Vitamin D deficiency    Past Surgical History:  Procedure Laterality Date   ABDOMINAL AORTOGRAM W/LOWER EXTREMITY N/A 06/01/2021   Procedure: ABDOMINAL AORTOGRAM W/LOWER EXTREMITY;  Surgeon: Serafina Mitchell, MD;  Location: [place] [facility];  Service: Cardiovascular;  Laterality: N/A;   BACK SURGERY     cervical laminoplasty C3-C7 Rex Dr. 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 Edwena Bunde [place] 12 mm d colon polyp, grade 1 IH, moderate diverticulosis tubular   ESOPHAGOGASTRODUODENOSCOPY  05/01/2020   erosive gastritis Dr. Tilford Pillar [place]   PERIPHERAL VASCULAR INTERVENTION  06/01/2021   Procedure: PERIPHERAL VASCULAR INTERVENTION;  Surgeon: Serafina Mitchell, MD;  Location: [place] [facility];  Service: Cardiovascular;;  RT. SFA   right total hip Right    03/05/2019 or 02/26/2020   TUBAL LIGATION     UPPER GI ENDOSCOPY     05/01/20   VASCULAR SURGERY     08/19/19   VASCULAR SURGERY     07/05/19   VASCULAR  SURGERY     05/21/2019 angioplasty since 05/2019, 06/2019 and 08/2019 had 2 left leg and 1 on right leg   Family History  Problem Relation Age of Onset   Hypertension Mother    Arthritis Mother    Cancer Father        colon cancer dx'ed died age 68   Hypertension Sister    Arthritis Sister    Cancer Sister        died in 16s brain tumor h/o lung cancer smoker died 08-25-2015    Early death Sister    Breast cancer Maternal Grandmother    Cancer Maternal Grandmother        breast dx older age    Hypertension Maternal Grandmother    Breast cancer Other        maternal neice   Cancer Other        died in 2s    Hypertension Daughter    Hypertension Daughter    Social History   Socioeconomic History   Marital status: Divorced    Spouse name: Not on file   Number of children: Not on file   Years of education: Not on file   Highest education level: Not on file  Occupational History   Not on file  Tobacco Use   Smoking status: Former    Packs/day: 1.00    Years: 45.00    Pack years: 45.00    Types: Cigarettes    Quit date: 12/18/2012    Years since quitting: 8.6   Smokeless tobacco: Never  Vaping Use   Vaping Use: Never used  Substance and Sexual Activity   Alcohol use: No   Drug use: Yes    Frequency: 3.0 times per week   Sexual activity: Not on file  Other Topics Concern   Not on file  Social History Narrative   Divorced    2 daughters [name] comes to most appts and is Chief Executive Officer    -lives with daughter [name]    Used to work for [employer] now [employer]    Former smoker age 51 to age 76/2012 quit for sure in 08-24-2012 max 1/2 ppd FH lung cancer sister also smoker    Social Determinants of Radio broadcast assistant Strain: Not on file  Food Insecurity: Not on file  Transportation Needs: Not on file  Physical Activity: Not on file  Stress: Not on file  Social Connections: Not on file  Intimate Partner Violence: Not on file   Current Meds  Medication Sig  acetaminophen (TYLENOL) 500 MG tablet Take 1,000 mg by mouth 2 (two) times daily as needed for moderate pain or mild pain. Rapid release   amLODipine (NORVASC) 5 MG tablet Take 5 mg by mouth daily.   aspirin EC 81 MG tablet Take 1 tablet (81 mg total) by mouth daily. Swallow whole.   Blood Glucose Monitoring Suppl (ONE TOUCH ULTRA 2) w/Device KIT Used to check blood sugars daily.   Cholecalciferol (VITAMIN D3) 50 MCG (2000 UT) capsule Take 2,000 Units by mouth daily.   clopidogrel (PLAVIX) 75 MG tablet Take 75 mg by mouth daily.   Continuous Blood Gluc Receiver (FREESTYLE LIBRE 2 READER) DEVI 1 Device by Does not apply route every 14 (fourteen) days.   Continuous Blood Gluc Sensor (FREESTYLE LIBRE 2 SENSOR) MISC by Does not apply route every 14 (fourteen) days.   Continuous Blood Gluc Sensor (FREESTYLE LIBRE 3 SENSOR) MISC 1 Device by Does not apply route 2 (two) times daily. Place 1 sensor on the skin every 14 days. Use to check glucose continuously   Dulaglutide (TRULICITY) 1.93 XT/0.2IO SOPN Inject 0.75 mg into the skin once a week.   empagliflozin (JARDIANCE) 10 MG TABS tablet Take 1 tablet (10 mg total) by mouth daily before breakfast.   ezetimibe (ZETIA) 10 MG tablet Take 10 mg by mouth daily.   glucose blood test strip Use 2 times daily before meals. Choice for meter   Lancets Ultra Fine MISC 1 Device by Does not apply route 2 (two) times daily as needed. Preferred for her device   LANTUS SOLOSTAR 100 UNIT/ML Solostar Pen Inject 40 Units into the skin daily.   losartan (COZAAR) 50 MG tablet Take 1 tablet (50 mg total) by mouth daily.   nebivolol (BYSTOLIC) 5 MG tablet Take 1 tablet (5 mg total) by mouth daily. Dc tenoretic   pravastatin (PRAVACHOL) 40 MG tablet Take 1 tablet (40 mg total) by mouth daily. At night   XARELTO 2.5 MG TABS tablet Take 2.5 mg by mouth 2 (two) times daily.   [DISCONTINUED] B-D UF III MINI PEN NEEDLES 31G X 5 MM MISC Inject 1 each into the skin daily. Or preferred  choice of the patient   [DISCONTINUED] glucose blood test strip Use 3 times daily before meals.   [DISCONTINUED] ONETOUCH DELICA LANCETS FINE MISC 1 each by Does not apply route 4 (four) times daily as needed.   Allergies  Allergen Reactions   Lisinopril Anaphylaxis and Swelling     Tongue swelling    Sulfamethoxazole-Trimethoprim Other (See Comments)    Worked against diabetic medication   Farxiga [Dapagliflozin]     diarrhea   Glipizide     Diarrhea    Metformin And Related     GI sx's diarrhea   Penicillins     Childhood   Tramadol Nausea Only and Nausea And Vomiting   Recent Results (from the past 2160 hour(s))  Microalbumin / creatinine urine ratio     Status: None   Collection Time: 04/27/21  7:35 AM  Result Value Ref Range   Creatinine, Urine 150 20 - 275 mg/dL   Microalb, Ur 0.8 mg/dL    Comment: Reference Range Not established    Microalb Creat Ratio 5 <30 mcg/mg creat    Comment: . The ADA defines abnormalities in albumin excretion as follows: Marland Kitchen Albuminuria Category        Result (mcg/mg creatinine) . Normal to Mildly increased   <30 Moderately increased  30-299  Severely increased           > OR = 300 . The ADA recommends that at least two of three specimens collected within a 3-6 month period be abnormal before considering a patient to be within a diagnostic category.   Vitamin D (25 hydroxy)     Status: None   Collection Time: 04/27/21  7:35 AM  Result Value Ref Range   VITD 43.55 30.00 - 100.00 ng/mL  Urinalysis, Routine w reflex microscopic     Status: None   Collection Time: 04/27/21  7:35 AM  Result Value Ref Range   Color, Urine YELLOW YELLOW   APPearance CLEAR CLEAR   Specific Gravity, Urine 1.018 1.001 - 1.035   pH 5.5 5.0 - 8.0   Glucose, UA NEGATIVE NEGATIVE   Bilirubin Urine NEGATIVE NEGATIVE   Ketones, ur NEGATIVE NEGATIVE   Hgb urine dipstick NEGATIVE NEGATIVE   Protein, ur NEGATIVE NEGATIVE   Nitrite NEGATIVE NEGATIVE    Leukocytes,Ua NEGATIVE NEGATIVE  TSH     Status: None   Collection Time: 04/27/21  7:35 AM  Result Value Ref Range   TSH 3.03 0.35 - 5.50 uIU/mL  CBC w/Diff     Status: None   Collection Time: 04/27/21  7:35 AM  Result Value Ref Range   WBC 6.5 4.0 - 10.5 K/uL   RBC 4.42 3.87 - 5.11 Mil/uL   Hemoglobin 12.2 12.0 - 15.0 g/dL   HCT 37.7 36.0 - 46.0 %   MCV 85.2 78.0 - 100.0 fl   MCHC 32.3 30.0 - 36.0 g/dL   RDW 15.3 11.5 - 15.5 %   Platelets 154.0 150.0 - 400.0 K/uL   Neutrophils Relative % 54.8 43.0 - 77.0 %   Lymphocytes Relative 36.5 12.0 - 46.0 %   Monocytes Relative 6.3 3.0 - 12.0 %   Eosinophils Relative 2.1 0.0 - 5.0 %   Basophils Relative 0.3 0.0 - 3.0 %   Neutro Abs 3.6 1.4 - 7.7 K/uL   Lymphs Abs 2.4 0.7 - 4.0 K/uL   Monocytes Absolute 0.4 0.1 - 1.0 K/uL   Eosinophils Absolute 0.1 0.0 - 0.7 K/uL   Basophils Absolute 0.0 0.0 - 0.1 K/uL  Hemoglobin A1c     Status: Abnormal   Collection Time: 04/27/21  7:35 AM  Result Value Ref Range   Hgb A1c MFr Bld 7.2 (H) 4.6 - 6.5 %    Comment: Glycemic Control Guidelines for People with Diabetes:Non Diabetic:  <6%Goal of Therapy: <7%Additional Action Suggested:  >8%   Lipid panel     Status: None   Collection Time: 04/27/21  7:35 AM  Result Value Ref Range   Cholesterol 133 0 - 200 mg/dL    Comment: ATP III Classification       Desirable:  < 200 mg/dL               Borderline High:  200 - 239 mg/dL          High:  > = 240 mg/dL   Triglycerides 91.0 0.0 - 149.0 mg/dL    Comment: Normal:  <150 mg/dLBorderline High:  150 - 199 mg/dL   HDL 48.00 >39.00 mg/dL   VLDL 18.2 0.0 - 40.0 mg/dL   LDL Cholesterol 67 0 - 99 mg/dL   Total CHOL/HDL Ratio 3     Comment:                Men  Women1/2 Average Risk     3.4          3.3Average Risk          5.0          4.42X Average Risk          9.6          7.13X Average Risk          15.0          11.0                       NonHDL 84.79     Comment: NOTE:  Non-HDL goal should be 30 mg/dL  higher than patient's LDL goal (i.e. LDL goal of < 70 mg/dL, would have non-HDL goal of < 100 mg/dL)  Comprehensive metabolic panel     Status: Abnormal   Collection Time: 04/27/21  7:35 AM  Result Value Ref Range   Sodium 142 135 - 145 mEq/L   Potassium 3.8 3.5 - 5.1 mEq/L   Chloride 104 96 - 112 mEq/L   CO2 28 19 - 32 mEq/L   Glucose, Bld 82 70 - 99 mg/dL   BUN 27 (H) 6 - 23 mg/dL   Creatinine, Ser 1.43 (H) 0.40 - 1.20 mg/dL   Total Bilirubin 0.5 0.2 - 1.2 mg/dL   Alkaline Phosphatase 77 39 - 117 U/L   AST 19 0 - 37 U/L   ALT 10 0 - 35 U/L   Total Protein 7.3 6.0 - 8.3 g/dL   Albumin 4.1 3.5 - 5.2 g/dL   GFR 35.83 (L) >60.00 mL/min    Comment: Calculated using the CKD-EPI Creatinine Equation (2021)   Calcium 9.5 8.4 - 10.5 mg/dL  Protein Electrophoresis, Urine Rflx.     Status: None   Collection Time: 05/18/21 10:36 AM  Result Value Ref Range   Protein, Ur 7.1 Not Estab. mg/dL   Albumin ELP, Urine 100.0 %   Alpha-1-Globulin, U 0.0 %   Alpha-2-Globulin, U 0.0 %   Beta Globulin, U 0.0 %   Gamma Globulin, U 0.0 %   M Component, Ur Not Observed Not Observed %   Please Note: Comment     Comment: Protein electrophoresis scan will follow via computer, mail, or courier delivery.   Protein Electrophoresis, (serum)     Status: Abnormal   Collection Time: 05/18/21 10:36 AM  Result Value Ref Range   Total Protein 7.8 6.1 - 8.1 g/dL   Albumin ELP 3.8 3.8 - 4.8 g/dL   Alpha 1 0.4 (H) 0.2 - 0.3 g/dL   Alpha 2 1.2 (H) 0.5 - 0.9 g/dL   Beta Globulin 0.4 0.4 - 0.6 g/dL   Beta 2 0.5 0.2 - 0.5 g/dL   Gamma Globulin 1.5 0.8 - 1.7 g/dL   SPE Interp.      Comment: . One or more serum protein fractions are outside the normal ranges.  No abnormal protein bands are apparent. .   Antinuclear Antib (ANA)     Status: Abnormal   Collection Time: 05/18/21 10:36 AM  Result Value Ref Range   Anti Nuclear Antibody (ANA) POSITIVE (A) NEGATIVE    Comment: ANA IFA is a first line screen for  detecting the presence of up to approximately 150 autoantibodies in various autoimmune diseases. A positive ANA IFA result is suggestive of autoimmune disease and reflexes to titer and pattern. Further laboratory testing may be considered if clinically indicated. . For additional  information, please refer to http://education.[facility].com/faq/FAQ177 (This link is being provided for informational/ educational purposes only.) .   Microalbumin / creatinine urine ratio     Status: None   Collection Time: 05/18/21 10:36 AM  Result Value Ref Range   Creatinine, Urine 102.0 Not Estab. mg/dL   Microalbumin, Urine 10.4 Not Estab. ug/mL   Microalb/Creat Ratio 10 0 - 29 mg/g creat    Comment:                        Normal:                0 -  29                        Moderately increased: 30 - 300                        Severely increased:       >147   Basic Metabolic Panel (BMET)     Status: Abnormal   Collection Time: 05/18/21 10:36 AM  Result Value Ref Range   Sodium 139 135 - 145 mEq/L   Potassium 4.3 3.5 - 5.1 mEq/L   Chloride 102 96 - 112 mEq/L   CO2 26 19 - 32 mEq/L   Glucose, Bld 74 70 - 99 mg/dL   BUN 22 6 - 23 mg/dL   Creatinine, Ser 1.39 (H) 0.40 - 1.20 mg/dL   GFR 37.06 (L) >60.00 mL/min    Comment: Calculated using the CKD-EPI Creatinine Equation (2021)   Calcium 9.5 8.4 - 10.5 mg/dL  Sodium, urine, random     Status: None   Collection Time: 05/18/21 10:36 AM  Result Value Ref Range   Sodium, Ur 121 Not Estab. mmol/L  Anti-nuclear ab-titer (ANA titer)     Status: Abnormal   Collection Time: 05/18/21 10:36 AM  Result Value Ref Range   ANA Titer 1 1:320 (H) titer    Comment:                 Reference Range                 <1:40        Negative                 1:40-1:80    Low Antibody Level                 >1:80        Elevated Antibody Level .    ANA Pattern 1 Nuclear, Homogeneous (A)     Comment: Homogeneous pattern is associated with systemic  lupus erythematosus (SLE), drug-induced lupus and juvenile idiopathic arthritis. . AC-1: Homogeneous . International Consensus on ANA Patterns ([email or web address]    ANA TITER 1:320 (H) titer    Comment:                 Reference Range                 <1:40        Negative                 1:40-1:80    Low Antibody Level                 >1:80        Elevated Antibody Level .    ANA PATTERN Nuclear, Speckled (A)  Comment: Speckled pattern is associated with mixed connective tissue disease (MCTD), systemic lupus erythematosus (SLE), Sjogren's syndrome, dermatomyositis, and  systemic sclerosis/polymyositis overlap. . AC-2,4,5,29: Speckled . International Consensus on ANA Patterns ([email or web address]   I-STAT, chem 8     Status: Abnormal   Collection Time: 06/01/21  8:12 AM  Result Value Ref Range   Sodium 141 135 - 145 mmol/L   Potassium 3.7 3.5 - 5.1 mmol/L   Chloride 106 98 - 111 mmol/L   BUN 22 8 - 23 mg/dL   Creatinine, Ser 1.40 (H) 0.44 - 1.00 mg/dL   Glucose, Bld 117 (H) 70 - 99 mg/dL    Comment: Glucose reference range applies only to samples taken after fasting for at least 8 hours.   Calcium, Ion 1.17 1.15 - 1.40 mmol/L   TCO2 26 22 - 32 mmol/L   Hemoglobin 12.2 12.0 - 15.0 g/dL   HCT 36.0 36.0 - 46.0 %  POCT Activated clotting time     Status: None   Collection Time: 06/01/21 10:45 AM  Result Value Ref Range   Activated Clotting Time 227 seconds    Comment: Reference range 74-137 seconds for patients not on anticoagulant therapy.  POCT Activated clotting time     Status: None   Collection Time: 06/01/21 12:10 PM  Result Value Ref Range   Activated Clotting Time 209 seconds    Comment: Reference range 74-137 seconds for patients not on anticoagulant therapy.  Glucose, capillary     Status: None   Collection Time: 06/01/21  1:51 PM  Result Value Ref Range   Glucose-Capillary 84 70 - 99 mg/dL    Comment: Glucose  reference range applies only to samples taken after fasting for at least 8 hours.  Glucose, capillary     Status: None   Collection Time: 06/01/21  1:53 PM  Result Value Ref Range   Glucose-Capillary 85 70 - 99 mg/dL    Comment: Glucose reference range applies only to samples taken after fasting for at least 8 hours.  POCT Activated clotting time     Status: None   Collection Time: 06/01/21  1:54 PM  Result Value Ref Range   Activated Clotting Time 185 seconds    Comment: Reference range 74-137 seconds for patients not on anticoagulant therapy.  Glucose, capillary     Status: None   Collection Time: 06/01/21  6:35 PM  Result Value Ref Range   Glucose-Capillary 78 70 - 99 mg/dL    Comment: Glucose reference range applies only to samples taken after fasting for at least 8 hours.  Hemoglobin and hematocrit, blood     Status: Abnormal   Collection Time: 06/01/21  7:59 PM  Result Value Ref Range   Hemoglobin 10.6 (L) 12.0 - 15.0 g/dL   HCT 33.9 (L) 36.0 - 46.0 %    Comment: Performed at [facility] [address], [place], [place] [address]  Glucose, capillary     Status: Abnormal   Collection Time: 06/01/21  9:15 PM  Result Value Ref Range   Glucose-Capillary 144 (H) 70 - 99 mg/dL    Comment: Glucose reference range applies only to samples taken after fasting for at least 8 hours.  CBC     Status: Abnormal   Collection Time: 06/02/21  1:17 AM  Result Value Ref Range   WBC 8.3 4.0 - 10.5 K/uL   RBC 3.34 (L) 3.87 - 5.11 MIL/uL   Hemoglobin 9.3 (L) 12.0 - 15.0 g/dL  HCT 29.2 (L) 36.0 - 46.0 %   MCV 87.4 80.0 - 100.0 fL   MCH 27.8 26.0 - 34.0 pg   MCHC 31.8 30.0 - 36.0 g/dL   RDW 14.7 11.5 - 15.5 %   Platelets 163 150 - 400 K/uL   nRBC 0.0 0.0 - 0.2 %    Comment: Performed at [facility], [place] [address] [place], [place] [address]  Basic metabolic panel     Status: Abnormal   Collection Time: 06/02/21  1:17 AM  Result Value Ref Range   Sodium 135 135 - 145 mmol/L    Potassium 3.7 3.5 - 5.1 mmol/L   Chloride 106 98 - 111 mmol/L   CO2 22 22 - 32 mmol/L   Glucose, Bld 143 (H) 70 - 99 mg/dL    Comment: Glucose reference range applies only to samples taken after fasting for at least 8 hours.   BUN 20 8 - 23 mg/dL   Creatinine, Ser 1.41 (H) 0.44 - 1.00 mg/dL   Calcium 8.1 (L) 8.9 - 10.3 mg/dL   GFR, Estimated 39 (L) >60 mL/min    Comment: (NOTE) Calculated using the CKD-EPI Creatinine Equation (2021)    Anion gap 7 5 - 15    Comment: Performed at [facility] [address] [place], [place] [address]  Glucose, capillary     Status: Abnormal   Collection Time: 06/02/21  6:10 AM  Result Value Ref Range   Glucose-Capillary 103 (H) 70 - 99 mg/dL    Comment: Glucose reference range applies only to samples taken after fasting for at least 8 hours.  Glucose, capillary     Status: Abnormal   Collection Time: 06/02/21 11:23 AM  Result Value Ref Range   Glucose-Capillary 103 (H) 70 - 99 mg/dL    Comment: Glucose reference range applies only to samples taken after fasting for at least 8 hours.  Hemoglobin and hematocrit, blood     Status: Abnormal   Collection Time: 06/02/21  1:27 PM  Result Value Ref Range   Hemoglobin 9.6 (L) 12.0 - 15.0 g/dL   HCT 29.9 (L) 36.0 - 46.0 %    Comment: Performed at [facility], [place] [address] [place], [place] [address]  ECHOCARDIOGRAM COMPLETE     Status: None   Collection Time: 06/18/21 10:18 AM  Result Value Ref Range   Area-P 1/2 3.06 cm2   S' Lateral 1.50 cm   Objective  Body mass index is 30.65 kg/m. Wt Readings from Last 3 Encounters:  07/23/21 162 lb 3.2 oz (73.6 kg)  07/05/21 164 lb 11.2 oz (74.7 kg)  06/01/21 168 lb (76.2 kg)   Temp Readings from Last 3 Encounters:  07/23/21 98.7 F (37.1 C) (Oral)  07/05/21 98.3 F (36.8 C) (Temporal)  06/02/21 99.3 F (37.4 C) (Oral)   BP Readings from Last 3 Encounters:  07/23/21 118/68  07/05/21 135/66  06/02/21 120/66   Pulse Readings from  Last 3 Encounters:  07/23/21 89  07/05/21 67  06/02/21 86    Physical Exam Vitals and nursing note reviewed.  Constitutional:      Appearance: Normal appearance. She is well-developed and well-groomed.  HENT:     Head: Normocephalic and atraumatic.  Eyes:     Conjunctiva/sclera: Conjunctivae normal.     Pupils: Pupils are equal, round, and reactive to light.  Cardiovascular:     Rate and Rhythm: Normal rate and regular rhythm.     Heart sounds:  Normal heart sounds. No murmur heard. Pulmonary:     Effort: Pulmonary effort is normal.     Breath sounds: Normal breath sounds.  Abdominal:     General: Abdomen is flat. Bowel sounds are normal.     Tenderness: There is no abdominal tenderness.  Musculoskeletal:     Left knee: Tenderness present over the medial joint line.  Skin:    General: Skin is warm and dry.  Neurological:     General: No focal deficit present.     Mental Status: She is alert and oriented to person, place, and time. Mental status is at baseline.     Cranial Nerves: Cranial nerves 2-12 are intact.     Motor: Motor function is intact.     Coordination: Coordination is intact.     Gait: Gait is intact.     Comments: BL walking with cane today and has rollator  Psychiatric:        Attention and Perception: Attention and perception normal.        Mood and Affect: Mood and affect normal.        Speech: Speech normal.        Behavior: Behavior normal. Behavior is cooperative.        Thought Content: Thought content normal.        Cognition and Memory: Cognition and memory normal.        Judgment: Judgment normal.    Assessment  Plan  Chronic pain of left knee - Plan: DG Knee Complete 4 Views Left  Type 2 diabetes mellitus with stage 3 chronic kidney disease, unspecified whether long term insulin use, unspecified whether stage 3a or 3b CKD (HCC) - Plan: Continuous Blood Gluc Sensor (FREESTYLE LIBRE 3 SENSOR) MISC, Continuous Blood Gluc Receiver (FREESTYLE LIBRE  2 READER) DEVI Cont insuline, trulicity, jardiance 10   BP controlled Hypertension associated with diabetes ([place]) - Plan: B-D UF III MINI PEN NEEDLES 31G X 5 MM MISC, Continuous Blood Gluc Sensor (FREESTYLE LIBRE 3 SENSOR) MISC, Continuous Blood Gluc Receiver (FREESTYLE LIBRE 2 READER) DEVI, Lancets Ultra Fine MISC, CBC with Differential/Platelet, Comprehensive metabolic panel, Hemoglobin A1c, Lipid panel, CBC 10/02/21   Diabetic eye exam ([place]) will refer [facility] 02/2022 [facility]   Bilateral hearing loss, unspecified hearing loss type - Plan: Ambulatory referral to ENT OSA on CPAP - Plan: Ambulatory referral to ENT-may need pulm referral if ENT will not do  PND (post-nasal drip) - Plan: Ambulatory referral to ENT  PAD (peripheral artery disease) ([place])  F/u 12/2021 Dr. 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 [facility]  prevnar had 06/20/17 per Dr. Kasandra Knudsen note had in [place] but she had in our office 10/17/17 Will disc shingrix in the future  Td had 02/14/11 and 02/17/18 ?Td vs tdap Covid 08/29/19, 09/26/19, 04/29/20, 12/02/20 had covid 11/2020 +, consider 5th dose    Shingrix ? If had was noted in Dr. Darcel Smalling note as recorded 06/20/17 ? If had    appt GI Dr. Darien Ramus [phone] colonoscopy 01/2018  Colonoscopy EGD see report from [facility] 05/01/20    CT chest with chronic smoking hx quit 2012 or 2014 former smoker 20-30 years 1 pk lasts 2 days  Outside CT chest in [place] w/o contrast 03/13/20 lung small nodules Due for repeat CT  chest 02/2021    [place] scan 03/13/20 no AAA aortic atherosclerosis   Diabetic eye exam 01/2020 normal per Dr. Kasandra Knudsen  DM foot exam 03/24/20 per Dr. Kasandra Knudsen Urine protein due 06/05/21 per Dr. Kasandra Knudsen    Dr. Kasandra Knudsen echo 04/2020 EF 65-70% mild concentric LVH, mild diastolic dysfunction low risk myocardial perfusion scan 05/2020    Out of  age window pap    mammo  07/22/21 normal    DEXA 10/30/13 osteopenia will rec vit D3 1000-2000 IU qd and calcium 600 mg bid  -consider repeat dexa if not had    OSA on cpap since 2020 apap 9-13 cmH20 Provider: Dr. Olivia Mackie   [facility]

## 2021-07-26 ENCOUNTER — Telehealth: Payer: Self-pay | Admitting: Internal Medicine

## 2021-07-26 NOTE — Telephone Encounter (Signed)
Patient called and would like a call back from the office. She would like someone explain the difference between Continuous Blood Gluc Sensor (FREESTYLE LIBRE 2 SENSOR) MISC amd the  Continuous Blood Gluc Sensor (FREESTYLE LIBRE 3 SENSOR) MISC.

## 2021-07-27 ENCOUNTER — Other Ambulatory Visit: Payer: Self-pay

## 2021-07-27 MED ORDER — FREESTYLE LIBRE 2 SENSOR MISC: 1.0000 | 5 refills | Status: DC

## 2021-07-27 NOTE — Telephone Encounter (Signed)
Libre 2 reader and [place] 3 sensor had been sent in to the pharmacy. Re sent the [place] 2 sensor to the pharmacy.   Explained to the Patient that the [place] 3 sensor is a newer version of the 2. It is said that the [place] 3 sensor is smaller and possibly more accurate. Patient verbalized understanding

## 2021-07-29 ENCOUNTER — Encounter: Payer: Self-pay | Admitting: Internal Medicine

## 2021-08-03 ENCOUNTER — Encounter: Payer: Self-pay | Admitting: Internal Medicine

## 2021-08-04 ENCOUNTER — Other Ambulatory Visit: Payer: Self-pay | Admitting: Internal Medicine

## 2021-08-04 ENCOUNTER — Encounter: Payer: Self-pay | Admitting: Interventional Cardiology

## 2021-08-04 DIAGNOSIS — B379 Candidiasis, unspecified: Secondary | ICD-10-CM

## 2021-08-04 MED ORDER — FLUCONAZOLE 150 MG PO TABS: 150.0000 mg | ORAL_TABLET | Freq: Once | ORAL | 1 refills | Status: AC

## 2021-08-04 NOTE — Telephone Encounter (Signed)
Okay for Patient to stop jardiance? Please advise on recommended over the counter yeast infection treatment

## 2021-08-16 ENCOUNTER — Other Ambulatory Visit: Payer: Self-pay | Admitting: Internal Medicine

## 2021-08-16 NOTE — Telephone Encounter (Signed)
Pended for your approval or denial. Medication not originally ordered by you

## 2021-08-17 MED ORDER — EZETIMIBE 10 MG PO TABS
10.0000 mg | ORAL_TABLET | Freq: Every day | ORAL | 3 refills | Status: DC
Start: 2021-08-17 — End: 2022-02-22

## 2021-08-18 ENCOUNTER — Other Ambulatory Visit: Payer: Self-pay | Admitting: Internal Medicine

## 2021-08-20 ENCOUNTER — Encounter: Payer: Self-pay | Admitting: Internal Medicine

## 2021-08-20 ENCOUNTER — Ambulatory Visit: Payer: Medicare PPO | Admitting: Internal Medicine

## 2021-08-20 ENCOUNTER — Other Ambulatory Visit: Payer: Self-pay

## 2021-08-20 VITALS — BP 128/70 | HR 72 | Temp 98.5°F | Ht 61.0 in | Wt 162.0 lb

## 2021-08-20 DIAGNOSIS — L309 Dermatitis, unspecified: Secondary | ICD-10-CM

## 2021-08-20 MED ORDER — CLOBETASOL PROPIONATE 0.05 % EX CREA: 1.0000 "application " | TOPICAL_CREAM | Freq: Two times a day (BID) | CUTANEOUS | 2 refills | Status: DC

## 2021-08-20 NOTE — Patient Instructions (Addendum)
Cetaphil or cerave cream   Also sarna lotion for itching   If needed allergy pill over the counter at night zyrtec/claritin/allegra    Stasis Dermatitis Stasis dermatitis is a long-term (chronic) skin condition that happens when veins can no longer pump blood back to the heart (poor circulation). This condition causes a red or brown scaly rash or sores (ulcers) from the pooling of blood (stasis). This condition usually affects the lower legs. It may affect one leg or both legs. Without treatment, severe stasis dermatitis can lead to other skin conditions and infections. What are the causes? This condition is caused by poor circulation. What increases the risk? You are more likely to develop this condition if: You are not very active. You stand for long periods of time. You have veins that have become enlarged and twisted (varicose veins). You have leg veins that are not strong enough to send blood back to the heart (venous insufficiency). You have had a blood clot. You have been pregnant many times. You have had vein surgery. You are obese. You have heart or kidney failure. You are 34 years of age or older. You have had injuries to your legs in the past. What are the signs or symptoms? Common early symptoms of this condition include: Itchiness in one or both of your legs. Swelling in your ankle or leg. This might get better overnight but be worse again during the day. Skin that looks thin on your ankle and leg. Red or brown marks that develop slowly. Skin that is dry, cracked, or easily irritated. Red, swollen skin that is sore or has a burning feeling. An achy or heavy feeling after you walk or stand for long periods of time. Pain. Later and more severe symptoms of this condition include: Skin that looks shiny. Small, open sores (ulcers). These are often red or purple and leak fluid. Skin that feels hard. Severe itching. A change in the shape or color of your lower  legs. Severe pain. Difficulty walking. How is this diagnosed? This condition may be diagnosed based on: Your symptoms and medical history. A physical exam. You may also have tests, including: Blood tests. Imaging tests to check blood flow (Doppler ultrasound). Allergy tests. You may need to see a health care provider who specializes in skin diseases (dermatologist). How is this treated? This condition may be treated with: Compression stockings or an elastic wrap to improve circulation. Medicines, such as: Corticosteroid creams and ointments. Non-corticosteroid medicines applied to the skin (topical). Medicine to reduce swelling in the legs (diuretics). Antibiotics. Medicine to relieve itching (antihistamines). A bandage (dressing). A wrap that contains zinc and gelatin (Unna boot). Follow these instructions at home: Skin care Moisturize your skin as told by your health care provider. Do not use moisturizers with fragrance. This can irritate your skin. Apply a cool, wet cloth (cool compress) to the affected areas. Do not scratch your skin. Do not rub your skin dry after a bath or shower. Gently pat your skin dry. Do not use scented soaps, detergents, or perfumes. Medicines Take or use over-the-counter and prescription medicines only as told by your health care provider. If you were prescribed an antibiotic medicine, take or use it as told by your health care provider. Do not stop taking or using the antibiotic even if your condition improves. Activity Walk as told by your health care provider. Walking increases blood flow. Do calf and ankle exercises throughout the day as told by your health care provider. This will  help increase blood flow. Raise (elevate) your legs above the level of your heart when you are sitting or lying down. Lifestyle Work with your health care provider to lose weight, if needed. Do not cross your legs when you sit. Do not stand or sit in one position  for long periods of time. Wear comfortable, loose-fitting clothing. Circulation in your legs will be worse if you wear tight pants, belts, and waistbands. Do not use any products that contain nicotine or tobacco, such as cigarettes, e-cigarettes, and chewing tobacco. If you need help quitting, ask your health care provider. General instructions If you were asked to use one of the following to help with your condition, follow instructions from your health care provider on how to: Remove and change any dressing. Wear compression stockings. These stockings help to prevent blood clots and reduce swelling in your legs. Wear the [employer]. Keep all follow-up visits as told by your health care provider. This is important. Contact a health care provider if: Your condition does not improve with treatment. Your condition gets worse. You have signs of infection in the affected area. Watch for: Swelling. Tenderness. Redness. Soreness. Warmth. You have a fever. Get help right away if: You notice red streaks coming from the affected area. Your bone or joint underneath the affected area becomes painful after the skin has healed. The affected area turns darker. You feel a deep pain in your leg or groin. You are short of breath. Summary Stasis dermatitis is a long-term (chronic) skin condition that happens when veins can no longer pump blood back to the heart (poor circulation). Wear compression stockings as told by your health care provider. These stockings help to prevent blood clots and reduce swelling in your legs. Follow instructions from your health care provider about activity, medicines, and lifestyle. Contact a health care provider if you have a fever or have signs of infection in the affected area. Keep all follow-up visits as told by your health care provider. This is important. This information is not intended to replace advice given to you by your health care provider. Make sure you  discuss any questions you have with your health care provider. Document Revised: 08/17/2020 Document Reviewed: 08/17/2020 Elsevier Patient Education  2022 [place] Dermatitis Dermatitis is redness, soreness, and swelling (inflammation) of the skin. Contact dermatitis is a reaction to certain substances that touch the skin. Many different substances can cause contact dermatitis. There are two types of contact dermatitis: Irritant contact dermatitis. This type is caused by something that irritates your skin, such as having dry hands from washing them too often with soap. This type does not require previous exposure to the substance for a reaction to occur. This is the most common type. Allergic contact dermatitis. This type is caused by a substance that you are allergic to, such as poison ivy. This type occurs when you have been exposed to the substance (allergen) and develop a sensitivity to it. Dermatitis may develop soon after your first exposure to the allergen, or it may not develop until the next time you are exposed and every time thereafter. What are the causes? Irritant contact dermatitis is most commonly caused by exposure to: Makeup. Soaps. Detergents. Bleaches. Acids. Metal salts, such as nickel. Allergic contact dermatitis is most commonly caused by exposure to: Poisonous plants. Chemicals. Jewelry. Latex. Medicines. Preservatives in products, such as clothing. What increases the risk? You are more likely to develop this condition if you have: A  job that exposes you to irritants or allergens. Certain medical conditions, such as asthma or eczema. What are the signs or symptoms? Symptoms of this condition may occur on your body anywhere the irritant has touched you or is touched by you. Symptoms include: Dryness or flaking. Redness. Cracks. Itching. Pain or a burning feeling. Blisters. Drainage of small amounts of blood or clear fluid from skin  cracks. With allergic contact dermatitis, there may also be swelling in areas such as the eyelids, mouth, or genitals. How is this diagnosed? This condition is diagnosed with a medical history and physical exam. A patch skin test may be performed to help determine the cause. If the condition is related to your job, you may need to see an occupational medicine specialist. How is this treated? This condition is treated by checking for the cause of the reaction and protecting your skin from further contact. Treatment may also include: Steroid creams or ointments. Oral steroid medicines may be needed in more severe cases. Antibiotic medicines or antibacterial ointments, if a skin infection is present. Antihistamine lotion or an antihistamine taken by mouth to ease itching. A bandage (dressing). Follow these instructions at home: Skin care Moisturize your skin as needed. Apply cool compresses to the affected areas. Try applying baking soda paste to your skin. Stir water into baking soda until it reaches a paste-like consistency. Do not scratch your skin, and avoid friction to the affected area. Avoid the use of soaps, perfumes, and dyes. Medicines Take or apply over-the-counter and prescription medicines only as told by your health care provider. If you were prescribed an antibiotic medicine, take or apply the antibiotic as told by your health care provider. Do not stop using the antibiotic even if your condition improves. Bathing Try taking a bath with: Epsom salts. Follow the instructions on the packaging. You can get these at your local pharmacy or grocery store. Baking soda. Pour a small amount into the bath as directed by your health care provider. Colloidal oatmeal. Follow the instructions on the packaging. You can get this at your local pharmacy or grocery store. Bathe less frequently, such as every other day. Bathe in lukewarm water. Avoid using hot water. Bandage care If you were  given a bandage (dressing), change it as told by your health care provider. Wash your hands with soap and water before and after you change your dressing. If soap and water are not available, use hand sanitizer. General instructions Avoid the substance that caused your reaction. If you do not know what caused it, keep a journal to try to track what caused it. Write down: What you eat. What cosmetic products you use. What you drink. What you wear in the affected area. This includes jewelry. Check the affected areas every day for signs of infection. Check for: More redness, swelling, or pain. More fluid or blood. Warmth. Pus or a bad smell. Keep all follow-up visits as told by your health care provider. This is important. Contact a health care provider if: Your condition does not improve with treatment. Your condition gets worse. You have signs of infection such as swelling, tenderness, redness, soreness, or warmth in the affected area. You have a fever. You have new symptoms. Get help right away if: You have a severe headache, neck pain, or neck stiffness. You vomit. You feel very sleepy. You notice red streaks coming from the affected area. Your bone or joint underneath the affected area becomes painful after the skin has healed. The affected  area turns darker. You have difficulty breathing. Summary Dermatitis is redness, soreness, and swelling (inflammation) of the skin. Contact dermatitis is a reaction to certain substances that touch the skin. Symptoms of this condition may occur on your body anywhere the irritant has touched you or is touched by you. This condition is treated by figuring out what caused the reaction and protecting your skin from further contact. Treatment may also include medicines and skin care. Avoid the substance that caused your reaction. If you do not know what caused it, keep a journal to try to track what caused it. Contact a health care provider if your  condition gets worse or you have signs of infection such as swelling, tenderness, redness, soreness, or warmth in the affected area. This information is not intended to replace advice given to you by your health care provider. Make sure you discuss any questions you have with your health care provider. Document Revised: 09/26/2018 Document Reviewed: 12/20/2017 Elsevier Patient Education  [place].

## 2021-08-20 NOTE — Progress Notes (Signed)
Chief Complaint  Patient presents with   Rash   F/u with daughter Mateo Flow  Rash on legs b/l since weds night itching not spreading or drainage b/l lower legs red and scratching  Stopped jardiance 08/03/21    Review of Systems  Constitutional:  Negative for weight loss.  HENT:  Negative for hearing loss.   Eyes:  Negative for blurred vision.  Respiratory:  Negative for shortness of breath.   Cardiovascular:  Negative for chest pain.  Gastrointestinal:  Negative for abdominal pain and blood in stool.  Genitourinary:  Negative for dysuria.  Musculoskeletal:  Negative for falls and joint pain.  Skin:  Positive for itching and rash.  Neurological:  Negative for headaches.  Psychiatric/Behavioral:  Negative for depression.   All other systems reviewed and are negative. 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 Loann Quill [facility]    COVID 19+    mid 11/2020   Degenerative disk disease    Diabetes ([place])    DVT (deep venous thrombosis) ([place])    DVT, lower extremity ([place])    left, 05/2013 on Xarelto x 1 month etiology unkown    GERD (gastroesophageal reflux disease)    Gout    H/O myocardial perfusion scan    06/08/20 low risk   Hammer toes, bilateral    HSV-2 infection    Hyperlipidemia    Hypertension    Insomnia    Intertrigo    Lumbar radiculopathy    improved after steroid inj and PT 10/21/20 had right L4/5 L5-S1 transforaminal epidural steroid  injections   Onychomycosis    OSA on CPAP    dx in 2020 after hip surgery CPAP 9-13 cm H20 supplies [employer] PSG 03/18/2019 severe OSA AHI 42.1/hr titration 03/27/2019 rec APAP 9-13 cm H20   PAD (peripheral artery disease) ([place])    1 x right, 3 x left in 2020-2022 needs bypass on leg had CTA with run off 11/2020 in [place]   PAD (peripheral artery disease) (HCC)    with stents   Peripheral vascular disease ([place])    Right foot drop    since 2020   Subdeltoid bursitis    Tinea pedis    Vitamin D deficiency    Past Surgical History:  Procedure Laterality Date   ABDOMINAL AORTOGRAM W/LOWER EXTREMITY N/A 06/01/2021   Procedure: ABDOMINAL AORTOGRAM W/LOWER EXTREMITY;  Surgeon: Serafina Mitchell, MD;  Location: [facility];  Service: Cardiovascular;  Laterality: N/A;   BACK SURGERY     cervical laminoplasty C3-C7 Rex Dr. 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 Edwena Bunde Aden[place] 12 mm d colon polyp, grade 1 IH, moderate diverticulosis tubular   ESOPHAGOGASTRODUODENOSCOPY  05/01/2020   erosive gastritis Dr.  Olaitan Adeniji [place]   PERIPHERAL VASCULAR INTERVENTION  06/01/2021   Procedure: PERIPHERAL VASCULAR INTERVENTION;  Surgeon: Serafina Mitchell, MD;  Location: [facility];  Service: Cardiovascular;;  RT. SFA   right total hip Right    03/05/2019 or 02/26/2020   TUBAL LIGATION     UPPER GI ENDOSCOPY     05/01/20   VASCULAR SURGERY     08/19/19   VASCULAR SURGERY     07/05/19   VASCULAR SURGERY     05/21/2019 angioplasty since 05/2019, 06/2019 and 24-Aug-2019 had 2 left leg and 1 on right leg   Family History  Problem Relation Age of Onset   Hypertension Mother    Arthritis Mother    Cancer Father        colon cancer dx'ed died age 26   Hypertension Sister    Arthritis Sister    Cancer Sister        died in 41s brain tumor h/o lung cancer smoker died  08-24-15    Early death Sister    Breast cancer Maternal Grandmother    Cancer Maternal Grandmother        breast dx older age    Hypertension Maternal Grandmother    Breast cancer Other        maternal neice   Cancer Other        died in 50s    Hypertension Daughter    Hypertension Daughter    Social History   Socioeconomic History   Marital status: Divorced    Spouse name: Not on file   Number of children: Not on file   Years of education: Not on file   Highest education level: Not on file  Occupational History   Not on file  Tobacco Use   Smoking status: Former    Packs/day: 1.00    Years: 45.00    Pack years: 45.00    Types: Cigarettes    Quit date: 12/18/2012    Years since quitting: 8.6   Smokeless tobacco: Never  Vaping Use   Vaping Use: Never used  Substance and Sexual Activity   Alcohol use: No   Drug use: Yes    Frequency: 3.0 times per week   Sexual activity: Not on file  Other Topics Concern   Not on file  Social History Narrative   Divorced    2 daughters [name] comes to most appts and is Chief Executive Officer    -lives with daughter [name]    Used to work for [employer] now [employer]    Former smoker age 27 to age 76/2012 quit for sure in 2012/08/23 max 1/2 ppd FH lung cancer sister also smoker    Social Determinants of Radio broadcast assistant Strain: Not on file  Food Insecurity: Not on file  Transportation Needs: Not on file  Physical Activity: Not on file  Stress: Not on file  Social Connections: Not on file  Intimate Partner Violence: Not on file   Current Meds  Medication Sig   acetaminophen (TYLENOL) 500 MG tablet Take 1,000 mg by mouth 2 (two) times daily as needed for moderate pain or mild pain. Rapid release   amLODipine (NORVASC) 5 MG tablet TAKE 1 TABLET BY MOUTH EVERY DAY   aspirin EC 81 MG tablet Take 1 tablet (81 mg total) by mouth daily. Swallow whole.   B-D UF III MINI PEN NEEDLES 31G X 5 MM MISC Inject 1 each into the skin 2 (two)  times daily. Or preferred choice of the patient   Blood Glucose Monitoring Suppl (ONE TOUCH ULTRA 2) w/Device KIT Used to check blood sugars daily.   Cholecalciferol (VITAMIN D3) 50 MCG (2000 UT) capsule Take 2,000 Units by mouth daily.   clobetasol cream (TEMOVATE) 4.16 % Apply 1 application topically 2 (two) times daily.   clopidogrel (PLAVIX) 75 MG tablet TAKE 1 TABLET BY MOUTH EVERY DAY   Continuous Blood Gluc Receiver (FREESTYLE LIBRE 2 READER) DEVI 1 Device by Does not apply route every 14 (fourteen) days.   Continuous Blood Gluc Sensor (FREESTYLE LIBRE 2 SENSOR) MISC 1 each by Does not apply route every 14 (fourteen) days.   Dulaglutide (TRULICITY) 3.84 TX/6.4WO SOPN Inject 0.75 mg into the skin once a week.   ezetimibe (ZETIA) 10 MG tablet Take 1 tablet (10 mg total) by mouth daily.   glucose blood test strip Use 2 times daily before meals. Choice for meter   Lancets Ultra Fine MISC 1 Device by Does not apply route 2 (two) times daily as needed. Preferred for her device   LANTUS SOLOSTAR 100 UNIT/ML Solostar Pen Inject 40 Units into the skin daily.   losartan (COZAAR) 50 MG tablet Take 1 tablet (50 mg total) by mouth daily.   nebivolol (BYSTOLIC) 5 MG tablet Take 1 tablet (5 mg total) by mouth daily. Dc tenoretic   pravastatin (PRAVACHOL) 40 MG tablet Take 1 tablet (40 mg total) by mouth daily. At night   XARELTO 2.5 MG TABS tablet Take 2.5 mg by mouth 2 (two) times daily.   Allergies  Allergen Reactions   Lisinopril Anaphylaxis and Swelling     Tongue swelling    Sulfamethoxazole-Trimethoprim Other (See Comments)    Worked against diabetic medication   Farxiga [Dapagliflozin]     diarrhea   Glipizide     Diarrhea    Metformin And Related     GI sx's diarrhea   Penicillins     Childhood   Tramadol Nausea Only and Nausea And Vomiting   Recent Results (from the past 2160 hour(s))  I-STAT, chem 8     Status: Abnormal   Collection Time: 06/01/21  8:12 AM  Result Value Ref  Range   Sodium 141 135 - 145 mmol/L   Potassium 3.7 3.5 - 5.1 mmol/L   Chloride 106 98 - 111 mmol/L   BUN 22 8 - 23 mg/dL   Creatinine, Ser 1.40 (H) 0.44 - 1.00 mg/dL   Glucose, Bld 117 (H) 70 - 99 mg/dL    Comment: Glucose reference range applies only to samples taken after fasting for at least 8 hours.   Calcium, Ion 1.17 1.15 - 1.40 mmol/L   TCO2 26 22 - 32 mmol/L   Hemoglobin 12.2 12.0 - 15.0 g/dL   HCT 36.0 36.0 - 46.0 %  POCT Activated clotting time     Status: None   Collection Time: 06/01/21 10:45 AM  Result Value Ref Range   Activated Clotting Time 227 seconds    Comment: Reference range 74-137 seconds for patients not on anticoagulant therapy.  POCT Activated clotting time     Status: None   Collection Time: 06/01/21 12:10 PM  Result Value Ref Range   Activated Clotting Time 209 seconds    Comment: Reference range 74-137 seconds for patients not on anticoagulant therapy.  Glucose, capillary     Status: None   Collection Time: 06/01/21  1:51 PM  Result Value Ref Range   Glucose-Capillary 84 70 -  99 mg/dL    Comment: Glucose reference range applies only to samples taken after fasting for at least 8 hours.  Glucose, capillary     Status: None   Collection Time: 06/01/21  1:53 PM  Result Value Ref Range   Glucose-Capillary 85 70 - 99 mg/dL    Comment: Glucose reference range applies only to samples taken after fasting for at least 8 hours.  POCT Activated clotting time     Status: None   Collection Time: 06/01/21  1:54 PM  Result Value Ref Range   Activated Clotting Time 185 seconds    Comment: Reference range 74-137 seconds for patients not on anticoagulant therapy.  Glucose, capillary     Status: None   Collection Time: 06/01/21  6:35 PM  Result Value Ref Range   Glucose-Capillary 78 70 - 99 mg/dL    Comment: Glucose reference range applies only to samples taken after fasting for at least 8 hours.  Hemoglobin and hematocrit, blood     Status: Abnormal   Collection  Time: 06/01/21  7:59 PM  Result Value Ref Range   Hemoglobin 10.6 (L) 12.0 - 15.0 g/dL   HCT 33.9 (L) 36.0 - 46.0 %    Comment: Performed at [facility] [address] [place], [place] [address]  Glucose, capillary     Status: Abnormal   Collection Time: 06/01/21  9:15 PM  Result Value Ref Range   Glucose-Capillary 144 (H) 70 - 99 mg/dL    Comment: Glucose reference range applies only to samples taken after fasting for at least 8 hours.  CBC     Status: Abnormal   Collection Time: 06/02/21  1:17 AM  Result Value Ref Range   WBC 8.3 4.0 - 10.5 K/uL   RBC 3.34 (L) 3.87 - 5.11 MIL/uL   Hemoglobin 9.3 (L) 12.0 - 15.0 g/dL   HCT 29.2 (L) 36.0 - 46.0 %   MCV 87.4 80.0 - 100.0 fL   MCH 27.8 26.0 - 34.0 pg   MCHC 31.8 30.0 - 36.0 g/dL   RDW 14.7 11.5 - 15.5 %   Platelets 163 150 - 400 K/uL   nRBC 0.0 0.0 - 0.2 %    Comment: Performed at [facility], [place] [address], [place], [place] [address]  Basic metabolic panel     Status: Abnormal   Collection Time: 06/02/21  1:17 AM  Result Value Ref Range   Sodium 135 135 - 145 mmol/L   Potassium 3.7 3.5 - 5.1 mmol/L   Chloride 106 98 - 111 mmol/L   CO2 22 22 - 32 mmol/L   Glucose, Bld 143 (H) 70 - 99 mg/dL    Comment: Glucose reference range applies only to samples taken after fasting for at least 8 hours.   BUN 20 8 - 23 mg/dL   Creatinine, Ser 1.41 (H) 0.44 - 1.00 mg/dL   Calcium 8.1 (L) 8.9 - 10.3 mg/dL   GFR, Estimated 39 (L) >60 mL/min    Comment: (NOTE) Calculated using the CKD-EPI Creatinine Equation (2021)    Anion gap 7 5 - 15    Comment: Performed at [facility] [address], [place], [place] [address]  Glucose, capillary     Status: Abnormal   Collection Time: 06/02/21  6:10 AM  Result Value Ref Range   Glucose-Capillary 103 (H) 70 - 99 mg/dL    Comment: Glucose reference range applies only to samples taken after fasting for at least  8 hours.  Glucose, capillary     Status: Abnormal   Collection  Time: 06/02/21 11:23 AM  Result Value Ref Range   Glucose-Capillary 103 (H) 70 - 99 mg/dL    Comment: Glucose reference range applies only to samples taken after fasting for at least 8 hours.  Hemoglobin and hematocrit, blood     Status: Abnormal   Collection Time: 06/02/21  1:27 PM  Result Value Ref Range   Hemoglobin 9.6 (L) 12.0 - 15.0 g/dL   HCT 29.9 (L) 36.0 - 46.0 %    Comment: Performed at [facility], [place] [address], [place], [place] [address]  ECHOCARDIOGRAM COMPLETE     Status: None   Collection Time: 06/18/21 10:18 AM  Result Value Ref Range   Area-P 1/2 3.06 cm2   S' Lateral 1.50 cm   Objective  Body mass index is 30.61 kg/m. Wt Readings from Last 3 Encounters:  08/20/21 162 lb (73.5 kg)  07/23/21 162 lb 3.2 oz (73.6 kg)  07/05/21 164 lb 11.2 oz (74.7 kg)   Temp Readings from Last 3 Encounters:  08/20/21 98.5 F (36.9 C) (Oral)  07/23/21 98.7 F (37.1 C) (Oral)  07/05/21 98.3 F (36.8 C) (Temporal)   BP Readings from Last 3 Encounters:  08/20/21 128/70  07/23/21 118/68  07/05/21 135/66   Pulse Readings from Last 3 Encounters:  08/20/21 72  07/23/21 89  07/05/21 67    Physical Exam Vitals and nursing note reviewed.  Constitutional:      Appearance: Normal appearance. She is well-developed and well-groomed.  HENT:     Head: Normocephalic and atraumatic.  Eyes:     Conjunctiva/sclera: Conjunctivae normal.     Pupils: Pupils are equal, round, and reactive to light.  Cardiovascular:     Rate and Rhythm: Normal rate and regular rhythm.     Heart sounds: Normal heart sounds. No murmur heard. Pulmonary:     Effort: Pulmonary effort is normal.     Breath sounds: Normal breath sounds.  Abdominal:     General: Abdomen is flat. Bowel sounds are normal.     Tenderness: There is no abdominal tenderness.  Musculoskeletal:        General: No tenderness.  Skin:    General: Skin is warm and dry.     Comments: ?stasis derm b;l legs  Neurological:      General: No focal deficit present.     Mental Status: She is alert and oriented to person, place, and time. Mental status is at baseline.     Cranial Nerves: Cranial nerves 2-12 are intact.     Gait: Gait is intact.  Psychiatric:        Attention and Perception: Attention and perception normal.        Mood and Affect: Mood and affect normal.        Speech: Speech normal.        Behavior: Behavior normal. Behavior is cooperative.        Thought Content: Thought content normal.        Cognition and Memory: Cognition and memory normal.        Judgment: Judgment normal.    Assessment  Plan  Dermatitis - Plan: clobetasol cream (TEMOVATE) 0.05 %  Cetaphil or cerave cream   Also sarna lotion for itching   If needed allergy pill over the counter at night zyrtec/claritin/allegra  HM See last visit  Provider: Dr. Olivia Mackie Mc[facility]

## 2021-09-17 ENCOUNTER — Ambulatory Visit: Payer: Medicare PPO | Admitting: Podiatry

## 2021-09-17 DIAGNOSIS — E0843 Diabetes mellitus due to underlying condition with diabetic autonomic (poly)neuropathy: Secondary | ICD-10-CM | POA: Diagnosis not present

## 2021-09-17 DIAGNOSIS — B351 Tinea unguium: Secondary | ICD-10-CM

## 2021-09-17 DIAGNOSIS — M79674 Pain in right toe(s): Secondary | ICD-10-CM | POA: Diagnosis not present

## 2021-09-17 DIAGNOSIS — M79675 Pain in left toe(s): Secondary | ICD-10-CM

## 2021-09-17 NOTE — Progress Notes (Signed)
? ?SUBJECTIVE ?Patient with a history of diabetes mellitus presents to office today complaining of elongated, thickened nails that cause pain while ambulating in shoes.  Patient is unable to trim their own nails. Patient is here for further evaluation and treatment. ? ? ?Past Medical History:  ?Diagnosis Date  ? Arthritis   ? lumbar, cervical, cervical/lumbar spondylosis  ? Blood clotting disorder ([place])   ? CAD (coronary artery disease)   ? Cardiac murmur   ? Carotid bruit   ? US carotid had 09/03/14 [place]   ? Carpal tunnel syndrome   ? Cataracts, bilateral   ? Cervical radiculopathy   ? improved since surgery 2016   ? Chicken pox   ? CKD (chronic kidney disease) stage 3, GFR 30-59 ml/min (HCC)   ? per Dr Vassie Moselle Su notes [place] noted 12/14/20  ? Colon polyp   ? 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 Loann Quill [facility]   ? COVID 19+   ? mid 11/2020  ? Degenerative disk disease   ? Diabetes ([place])   ? DVT (deep venous thrombosis) ([place])   ? DVT, lower extremity ([place])   ? left, 05/2013 on Xarelto x 1 month etiology unkown   ? GERD (gastroesophageal reflux disease)   ? Gout   ? H/O myocardial perfusion scan   ? 06/08/20 low risk  ? Hammer toes, bilateral   ? HSV-2 infection   ? Hyperlipidemia   ? Hypertension   ? Insomnia   ? Intertrigo   ? Lumbar radiculopathy   ? improved after steroid inj and PT 10/21/20 had right L4/5 L5-S1 transforaminal epidural steroid injections  ? Onychomycosis   ? OSA on CPAP   ? dx in 2020 after hip surgery CPAP 9-13 cm H20 supplies [employer] PSG 03/18/2019 severe OSA AHI 42.1/hr titration 03/27/2019 rec APAP 9-13 cm H20  ? PAD (peripheral artery disease) ([place])   ? 1 x right, 3 x left in 2020-2022 needs bypass on leg had CTA with run off 11/2020 in [place]  ? PAD (peripheral artery disease) ([place])   ? with stents  ? Peripheral vascular disease ([place])   ? Right foot drop   ? since 2020  ? Subdeltoid bursitis   ? Tinea pedis   ? Vitamin D deficiency    ? ? ?OBJECTIVE ?General Patient is awake, alert, and oriented x 3 and in no acute distress. ?Derm Skin is dry and supple bilateral. Negative open lesions or macerations. Remaining integument unremarkable. Nails are tender, long, thickened and dystrophic with subungual debris, consistent with onychomycosis, 1-5 bilateral. No signs of infection noted. ?Vasc  DP and PT pedal pulses palpable bilaterally. Temperature gradient within normal limits.  ?Neuro Epicritic and protective threshold sensation diminished bilaterally.  ?Musculoskeletal Exam No symptomatic pedal deformities noted bilateral. Muscular strength within normal limits. ? ?ASSESSMENT ?1. Diabetes Mellitus w/ peripheral neuropathy ?2.  Pain due to onychomycosis of toenails bilateral ? ?PLAN OF CARE ?1. Patient evaluated today. ?2. Instructed to maintain good pedal hygiene and foot care. Stressed importance of controlling blood sugar.  ?3. Mechanical debridement of nails 1-5 bilaterally performed using a nail nipper. Filed with dremel without incident.  ?4. Return to clinic in 3 mos.  ? ? ? ?Edrick Kins, DPM ?[facility] ? ?Dr. Edrick Kins, DPM  ?  ?[address]                                      ?  [place], [place] [address]                ?Office [phone]  ?Fax [phone] ? ? ? ? ? ?

## 2021-09-20 DIAGNOSIS — H903 Sensorineural hearing loss, bilateral: Secondary | ICD-10-CM | POA: Diagnosis not present

## 2021-09-22 ENCOUNTER — Ambulatory Visit (INDEPENDENT_AMBULATORY_CARE_PROVIDER_SITE_OTHER): Payer: Medicare PPO

## 2021-09-22 VITALS — Ht 61.0 in | Wt 162.0 lb

## 2021-09-22 DIAGNOSIS — Z0000 Encounter for general adult medical examination without abnormal findings: Secondary | ICD-10-CM

## 2021-09-22 NOTE — Progress Notes (Signed)
Subjective:   Natalie Watts is a 76 y.o. female who presents for Medicare Annual (Subsequent) preventive examination.  Review of Systems    No ROS.  Medicare Wellness Virtual Visit.  Visual/audio telehealth visit, UTA vital signs.   See social history for additional risk factors.   Cardiac Risk Factors include: advanced age (>55men, >53 women);diabetes mellitus;hypertension     Objective:    Today's Vitals   09/22/21 1450  Weight: 162 lb (73.5 kg)  Height: 5\' 1"  (1.549 m)   Body mass index is 30.61 kg/m.     09/22/2021    2:57 PM 06/01/2021    8:18 PM 04/02/2018    8:20 AM 01/02/2018    9:28 AM 09/11/2017    3:36 PM 11/07/2014    9:23 AM  Advanced Directives  Does Patient Have a Medical Advance Directive? Yes No No Yes No No  Type of Estate agent of [place];Living will   Healthcare Power of Attorney    Does patient want to make changes to medical advance directive? No - Patient declined   No - Patient declined    Copy of Healthcare Power of Attorney in Chart? No - copy requested   No - copy requested    Would patient like information on creating a medical advance directive?  No - Patient declined   Yes (MAU/Ambulatory/Procedural Areas - Information given) No - patient declined information   Current Medications (verified) Outpatient Encounter Medications as of 09/22/2021  Medication Sig   acetaminophen (TYLENOL) 500 MG tablet Take 1,000 mg by mouth 2 (two) times daily as needed for moderate pain or mild pain. Rapid release   amLODipine (NORVASC) 5 MG tablet TAKE 1 TABLET BY MOUTH EVERY DAY   aspirin EC 81 MG tablet Take 1 tablet (81 mg total) by mouth daily. Swallow whole.   B-D UF III MINI PEN NEEDLES 31G X 5 MM MISC Inject 1 each into the skin 2 (two) times daily. Or preferred choice of the patient   Blood Glucose Monitoring Suppl (ONE TOUCH ULTRA 2) w/Device KIT Used to check blood sugars daily.   Cholecalciferol (VITAMIN D3) 50 MCG (2000 UT) capsule  Take 2,000 Units by mouth daily.   clobetasol cream (TEMOVATE) 0.05 % Apply 1 application topically 2 (two) times daily.   clopidogrel (PLAVIX) 75 MG tablet TAKE 1 TABLET BY MOUTH EVERY DAY   Continuous Blood Gluc Receiver (FREESTYLE LIBRE 2 READER) DEVI 1 Device by Does not apply route every 14 (fourteen) days.   Continuous Blood Gluc Sensor (FREESTYLE LIBRE 2 SENSOR) MISC 1 each by Does not apply route every 14 (fourteen) days.   Dulaglutide (TRULICITY) 0.75 MG/0.5ML SOPN Inject 0.75 mg into the skin once a week.   ezetimibe (ZETIA) 10 MG tablet Take 1 tablet (10 mg total) by mouth daily.   glucose blood test strip Use 2 times daily before meals. Choice for meter   Lancets Ultra Fine MISC 1 Device by Does not apply route 2 (two) times daily as needed. Preferred for her device   LANTUS SOLOSTAR 100 UNIT/ML Solostar Pen Inject 40 Units into the skin daily.   losartan (COZAAR) 50 MG tablet Take 1 tablet (50 mg total) by mouth daily.   nebivolol (BYSTOLIC) 5 MG tablet Take 1 tablet (5 mg total) by mouth daily. Dc tenoretic   pravastatin (PRAVACHOL) 40 MG tablet Take 1 tablet (40 mg total) by mouth daily. At night   XARELTO 2.5 MG TABS tablet Take 2.5 mg  by mouth 2 (two) times daily.   No facility-administered encounter medications on file as of 09/22/2021.   Allergies (verified) Lisinopril, Sulfamethoxazole-trimethoprim, Farxiga [dapagliflozin], Glipizide, Metformin and related, Penicillins, and Tramadol   History: Past Medical History:  Diagnosis Date   Arthritis    lumbar, cervical, cervical/lumbar spondylosis   Blood clotting disorder (HCC)    CAD (coronary artery disease)    Cardiac murmur    Carotid bruit    US carotid had 09/03/14 [facility]    Carpal tunnel syndrome    Cataracts, bilateral    Cervical radiculopathy    improved since surgery 2016    Chicken pox    CKD (chronic kidney disease) stage 3, GFR 30-59 ml/min (HCC)    per Dr Ailene Ravel Su notes [place]  noted 12/14/20   Colon polyp    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 Griffith Citron [facility]    COVID 19+    mid 11/2020   Degenerative disk disease    Diabetes (HCC)    DVT (deep venous thrombosis) (HCC)    DVT, lower extremity (HCC)    left, 05/2013 on Xarelto x 1 month etiology unkown    GERD (gastroesophageal reflux disease)    Gout    H/O myocardial perfusion scan    06/08/20 low risk   Hammer toes, bilateral    HSV-2 infection    Hyperlipidemia    Hypertension    Insomnia    Intertrigo    Lumbar radiculopathy    improved after steroid inj and PT 10/21/20 had right L4/5 L5-S1 transforaminal epidural steroid injections   Onychomycosis    OSA on CPAP    dx in 2020 after hip surgery CPAP 9-13 cm H20 supplies [employer] PSG 03/18/2019 severe OSA AHI 42.1/hr titration 03/27/2019 rec APAP 9-13 cm H20   PAD (peripheral artery disease) (HCC)    1 x right, 3 x left in 2020-2022 needs bypass on leg had CTA with run off 11/2020 in [place]   PAD (peripheral artery disease) (HCC)    with stents   Peripheral vascular disease (HCC)    Right foot drop    since 2020   Subdeltoid bursitis    Tinea pedis    Vitamin D deficiency    Past Surgical History:  Procedure Laterality Date   ABDOMINAL AORTOGRAM W/LOWER EXTREMITY N/A 06/01/2021   Procedure: ABDOMINAL AORTOGRAM W/LOWER EXTREMITY;  Surgeon: Nada Libman, MD;  Location: [facility] INV[facility]IVE [facility];  Service: Cardiovascular;  Laterality: N/A;   BACK SURGERY     cervical laminoplasty C3-C7 Rex Dr. Liliane Bade fusion/decompression 2/2 myelopathy 03/2014 or 02/2015   COLON SURGERY     colectomy in 2015   COLONOSCOPY  05/01/2020   [place] [facility] [place] Dr. Noah Delaine Adeniji 12 mm d colon polyp, grade 1 IH, moderate diverticulosis tubular   ESOPHAGOGASTRODUODENOSCOPY  05/01/2020   erosive gastritis Dr. Brunetta Jeans [place]   PERIPHERAL VASCULAR INTERVENTION  06/01/2021   Procedure: PERIPHERAL VASCULAR INTERVENTION;   Surgeon: Nada Libman, MD;  Location: [facility];  Service: Cardiovascular;;  RT. SFA   right total hip Right    03/05/2019 or 02/26/2020   TUBAL LIGATION     UPPER GI ENDOSCOPY     05/01/20   VASCULAR SURGERY     08/19/19   VASCULAR SURGERY     07/05/19   VASCULAR SURGERY     05/21/2019 angioplasty since 05/2019, 06/2019 and 08/2019 had 2 left leg and  1 on right leg   Family History  Problem Relation Age of Onset   Hypertension Mother    Arthritis Mother    Cancer Father        colon cancer dx'ed died age 28   Hypertension Sister    Arthritis Sister    Cancer Sister        died in 75s brain tumor h/o lung cancer smoker died November 22, 2015    Early death Sister    Breast cancer Maternal Grandmother    Cancer Maternal Grandmother        breast dx older age    Hypertension Maternal Grandmother    Breast cancer Other        maternal neice   Cancer Other        died in 36s    Hypertension Daughter    Hypertension Daughter    Social History   Socioeconomic History   Marital status: Divorced    Spouse name: Not on file   Number of children: Not on file   Years of education: Not on file   Highest education level: Not on file  Occupational History   Not on file  Tobacco Use   Smoking status: Former    Packs/day: 1.00    Years: 45.00    Pack years: 45.00    Types: Cigarettes    Quit date: 12/18/2012    Years since quitting: 8.7   Smokeless tobacco: Never  Vaping Use   Vaping Use: Never used  Substance and Sexual Activity   Alcohol use: No   Drug use: Yes    Frequency: 3.0 times per week   Sexual activity: Not on file  Other Topics Concern   Not on file  Social History Narrative   Divorced    2 daughters Lovenia Shuck comes to most appts and is Clinical research associate    -lives with daughter Lovenia Shuck    Used to work for [employer] now [employer]    Former smoker age 89 to age 76/2012 quit for sure in 2012/11/21 max 1/2 ppd FH lung cancer sister also smoker    Social Determinants of  Corporate investment banker Strain: Not on file  Food Insecurity: No Food Insecurity   Worried About Programme researcher, broadcasting/film/video in the Last Year: Never true   Barista in the Last Year: Never true  Transportation Needs: No Transportation Needs   Lack of Transportation (Medical): No   Lack of Transportation (Non-Medical): No  Physical Activity: Insufficiently Active   Days of Exercise per Week: 1 day   Minutes of Exercise per Session: 60 min  Stress: No Stress Concern Present   Feeling of Stress : Not at all  Social Connections: Unknown   Frequency of Communication with Friends and Family: More than three times a week   Frequency of Social Gatherings with Friends and Family: More than three times a week   Attends Religious Services: More than 4 times per year   Active Member of [employer] or Organizations: Not on file   Attends Engineer, structural: More than 4 times per year   Marital Status: Not on file   Tobacco Counseling Counseling given: Not Answered  Clinical Intake: Pre-visit preparation completed: Yes        Diabetes: Yes (Followed by PCP)  How often do you need to have someone help you when you read instructions, pamphlets, or other written materials from your doctor or pharmacy?: 1 - Never  Activities of Daily Living    09/22/2021    2:58 PM 06/01/2021    8:18 PM  In your present state of health, do you have any difficulty performing the following activities:  Hearing? 1 0  Comment Hearing aids   Vision? 0 0  Difficulty concentrating or making decisions? 0 0  Walking or climbing stairs? 1 0  Comment Cane in use when ambulating.   Dressing or bathing? 0 0  Doing errands, shopping? 0 0  Preparing Food and eating ? Y   Comment Family assist. Self feeds.   Using the Toilet? N   In the past six months, have you accidently leaked urine? N   Do you have problems with loss of bowel control? N   Managing your Medications? N   Managing your Finances? N    Housekeeping or managing your Housekeeping? Y   Comment Maid assist as needed    Patient Care Team: McLean-Scocuzza, Pasty Spillers, MD as PCP - General (Internal Medicine) Lyn Records, MD as PCP - Cardiology (Cardiology)  Indicate any recent Medical Services you may have received from other than [facility] providers in the past year (date may be approximate).     Assessment:   This is a routine wellness examination for Lydianna.  Virtual Visit via Telephone Note  I connected with  [name] on 09/22/21 at  2:45 PM EDT by telephone and verified that I am speaking with the correct person using two identifiers.  Persons participating in the virtual visit: patient/Nurse Health Advisor   I discussed the limitations of performing an evaluation and management service by telehealth. The patient expressed understanding and agreed to proceed. We continued and completed visit with audio only. Some vital signs may be absent or patient reported.   Hearing/Vision screen Hearing Screening - Comments:: Hearing aids Vision Screening - Comments:: Followed by [facility]  Wears corrective lenses  No history of retinopathy reported  She plans to schedule an eye exam with her ophthalmologist for a diabetic exam  Dietary issues and exercise activities discussed:   Healthy diet Good water intake   Goals Addressed             This Visit's Progress    Healthy Lifestyle       Stay active, exercise. Stay hydrated, drink plenty of fluids. Healthy diet; low carb. Monitor sugar intake.       Depression Screen    09/22/2021    2:54 PM 04/21/2021    2:24 PM 01/02/2018    8:35 AM 10/17/2017    8:39 AM 09/11/2017    3:36 PM 08/17/2017    8:50 AM 06/15/2017    2:16 PM  PHQ 2/9 Scores  PHQ - 2 Score 0 0 0 0 0 0 0    Fall Risk    09/22/2021    2:57 PM 04/21/2021    2:23 PM 05/08/2018    8:13 AM 01/02/2018    8:35 AM 10/17/2017    8:39 AM  Fall Risk   Falls in the past year? 1 0 0 No  No  Number falls in past yr: 0 0     Injury with Fall? 0 0     Risk for fall due to :  Impaired balance/gait;Impaired mobility     Follow up Falls evaluation completed Falls evaluation completed       FALL RISK PREVENTION PERTAINING TO THE HOME: Home free of loose throw rugs in walkways, pet beds, electrical cords,  etc? Yes  Adequate lighting in your home to reduce risk of falls? Yes   ASSISTIVE DEVICES UTILIZED TO PREVENT FALLS: Medical diabetic alert? Yes  Use of a cane, walker or w/c? Yes  Grab bars in the bathroom? Yes  Shower chair or bench in shower? Yes  Elevated toilet seat or a handicapped toilet? Yes   TIMED UP AND GO: Was the test performed? No .   Cognitive Function:    01/02/2018    9:37 AM  MMSE - Mini Mental State Exam  Orientation to time 5  Orientation to Place 5  Registration 3  Attention/ Calculation 5  Recall 3  Language- name 2 objects 2  Language- repeat 1  Language- follow 3 step command 3  Language- read & follow direction 1  Write a sentence 1  Copy design 1  Total score 30        09/22/2021    3:00 PM  6CIT Screen  What Year? 0 points  What month? 0 points  What time? 0 points  Count back from 20 0 points  Months in reverse 0 points  Repeat phrase 0 points  Total Score 0 points    Immunizations Immunization History  Administered Date(s) Administered   Hepatitis B, adult 06/15/2017, 08/17/2017, 12/20/2017   Influenza, High Dose Seasonal PF 04/07/2017, 04/05/2018, 04/09/2021   Influenza,inj,Quad PF,6+ Mos 04/09/2013   Influenza-Unspecified 04/09/2013   PFIZER Comirnaty(Gray Top)Covid-19 Tri-Sucrose Vaccine 12/02/2020   PFIZER(Purple Top)SARS-COV-2 Vaccination 08/29/2019, 09/26/2019, 04/29/2020   Pneumococcal Conjugate-13 10/17/2017   Pneumococcal Polysaccharide-23 11/27/2008   Td 02/14/2011, 02/17/2018   Screening Tests Health Maintenance  Topic Date Due   Zoster Vaccines- Shingrix (1 of 2) 12/22/2021 (Originally 08/16/1964)    COVID-19 Vaccine (5 - Booster for Pfizer series) 07/21/2022 (Originally 01/27/2021)   HEMOGLOBIN A1C  10/25/2021   INFLUENZA VACCINE  01/18/2022   OPHTHALMOLOGY EXAM  02/03/2022   FOOT EXAM  03/23/2022   TETANUS/TDAP  02/18/2028   Pneumonia Vaccine 87+ Years old  Completed   DEXA SCAN  Completed   Hepatitis C Screening  Completed   HPV VACCINES  Aged Out   COLONOSCOPY (Pts 45-17yrs Insurance coverage will need to be confirmed)  Discontinued   Health Maintenance There are no preventive care reminders to display for this patient.  Mammogram- completed 07/22/21.  Lung Cancer Screening: (Low Dose CT Chest recommended if Age 25-80 years, 30 pack-year currently smoking OR have quit w/in 15years.) does not qualify.   Vision Screening: Recommended annual ophthalmology exams for early detection of glaucoma and other disorders of the eye.  Dental Screening: Recommended annual dental exams for proper oral hygiene  Community Resource Referral / Chronic Care Management: CRR required this visit?  No   CCM required this visit?  No      Plan:   Keep all routine maintenance appointments.   I have personally reviewed and noted the following in the patient's chart:   Medical and social history Use of alcohol, tobacco or illicit drugs  Current medications and supplements including opioid prescriptions.  Functional ability and status Nutritional status Physical activity Advanced directives List of other physicians Hospitalizations, surgeries, and ER visits in previous 12 months Vitals Screenings to include cognitive, depression, and falls Referrals and appointments  In addition, I have reviewed and discussed with patient certain preventive protocols, quality metrics, and best practice recommendations. A written personalized care plan for preventive services as well as general preventive health recommendations were provided to patient.     Lollie Sails,  Latorria Zeoli L, LPN   06/25/1094

## 2021-09-22 NOTE — Patient Instructions (Addendum)
?  Natalie Watts , ?Thank you for taking time to come for your Medicare Wellness Visit. I appreciate your ongoing commitment to your health goals. Please review the following plan we discussed and let me know if I can assist you in the future.  ? ?These are the goals we discussed: ? Goals   ? ?  Healthy Lifestyle   ?  Stay active, exercise. ?Stay hydrated, drink plenty of fluids. ?Healthy diet; low carb. ?Monitor sugar intake. ?  ? ?  ?  ?This is a list of the screening recommended for you and due dates:  ?Health Maintenance  ?Topic Date Due  ? Zoster (Shingles) Vaccine (1 of 2) 12/22/2021*  ? COVID-19 Vaccine (5 - Booster for Pfizer series) 07/21/2022*  ? Hemoglobin A1C  10/25/2021  ? Flu Shot  01/18/2022  ? Eye exam for diabetics  02/03/2022  ? Complete foot exam   03/23/2022  ? Tetanus Vaccine  02/18/2028  ? Pneumonia Vaccine  Completed  ? DEXA scan (bone density measurement)  Completed  ? Hepatitis C Screening: USPSTF Recommendation to screen - Ages 62-79 yo.  Completed  ? HPV Vaccine  Aged Out  ? Colon Cancer Screening  Discontinued  ?*Topic was postponed. The date shown is not the original due date.  ?  ?

## 2021-09-23 DIAGNOSIS — M25562 Pain in left knee: Secondary | ICD-10-CM | POA: Diagnosis not present

## 2021-09-23 DIAGNOSIS — M1991 Primary osteoarthritis, unspecified site: Secondary | ICD-10-CM | POA: Diagnosis not present

## 2021-09-23 DIAGNOSIS — M79641 Pain in right hand: Secondary | ICD-10-CM | POA: Diagnosis not present

## 2021-09-23 DIAGNOSIS — M25512 Pain in left shoulder: Secondary | ICD-10-CM | POA: Diagnosis not present

## 2021-09-23 DIAGNOSIS — G8929 Other chronic pain: Secondary | ICD-10-CM | POA: Diagnosis not present

## 2021-09-23 DIAGNOSIS — M79642 Pain in left hand: Secondary | ICD-10-CM | POA: Diagnosis not present

## 2021-09-23 DIAGNOSIS — E669 Obesity, unspecified: Secondary | ICD-10-CM | POA: Diagnosis not present

## 2021-09-23 DIAGNOSIS — Z6831 Body mass index (BMI) 31.0-31.9, adult: Secondary | ICD-10-CM | POA: Diagnosis not present

## 2021-09-23 DIAGNOSIS — R768 Other specified abnormal immunological findings in serum: Secondary | ICD-10-CM | POA: Diagnosis not present

## 2021-09-30 ENCOUNTER — Other Ambulatory Visit: Payer: Self-pay | Admitting: Internal Medicine

## 2021-09-30 ENCOUNTER — Telehealth: Payer: Self-pay

## 2021-09-30 ENCOUNTER — Other Ambulatory Visit (INDEPENDENT_AMBULATORY_CARE_PROVIDER_SITE_OTHER): Payer: Medicare PPO

## 2021-09-30 DIAGNOSIS — E1159 Type 2 diabetes mellitus with other circulatory complications: Secondary | ICD-10-CM | POA: Diagnosis not present

## 2021-09-30 DIAGNOSIS — I152 Hypertension secondary to endocrine disorders: Secondary | ICD-10-CM

## 2021-09-30 DIAGNOSIS — E876 Hypokalemia: Secondary | ICD-10-CM

## 2021-09-30 DIAGNOSIS — E1165 Type 2 diabetes mellitus with hyperglycemia: Secondary | ICD-10-CM

## 2021-09-30 LAB — CBC WITH DIFFERENTIAL/PLATELET
Basophils Absolute: 0 10*3/uL (ref 0.0–0.1)
Basophils Relative: 0.3 % (ref 0.0–3.0)
Eosinophils Absolute: 0.2 10*3/uL (ref 0.0–0.7)
Eosinophils Relative: 2.4 % (ref 0.0–5.0)
HCT: 39.4 % (ref 36.0–46.0)
Hemoglobin: 12.6 g/dL (ref 12.0–15.0)
Lymphocytes Relative: 40.3 % (ref 12.0–46.0)
Lymphs Abs: 2.8 10*3/uL (ref 0.7–4.0)
MCHC: 31.9 g/dL (ref 30.0–36.0)
MCV: 85.1 fl (ref 78.0–100.0)
Monocytes Absolute: 0.5 10*3/uL (ref 0.1–1.0)
Monocytes Relative: 7.1 % (ref 3.0–12.0)
Neutro Abs: 3.5 10*3/uL (ref 1.4–7.7)
Neutrophils Relative %: 49.9 % (ref 43.0–77.0)
Platelets: 156 10*3/uL (ref 150.0–400.0)
RBC: 4.63 Mil/uL (ref 3.87–5.11)
RDW: 17 % — ABNORMAL HIGH (ref 11.5–15.5)
WBC: 7.1 10*3/uL (ref 4.0–10.5)

## 2021-09-30 LAB — COMPREHENSIVE METABOLIC PANEL
ALT: 18 U/L (ref 0–35)
AST: 21 U/L (ref 0–37)
Albumin: 4 g/dL (ref 3.5–5.2)
Alkaline Phosphatase: 85 U/L (ref 39–117)
BUN: 21 mg/dL (ref 6–23)
CO2: 28 mEq/L (ref 19–32)
Calcium: 9.2 mg/dL (ref 8.4–10.5)
Chloride: 104 mEq/L (ref 96–112)
Creatinine, Ser: 1.05 mg/dL (ref 0.40–1.20)
GFR: 51.75 mL/min — ABNORMAL LOW (ref >60.00)
Glucose, Bld: 77 mg/dL (ref 70–99)
Potassium: 3.2 mEq/L — ABNORMAL LOW (ref 3.5–5.1)
Sodium: 140 mEq/L (ref 135–145)
Total Bilirubin: 0.5 mg/dL (ref 0.2–1.2)
Total Protein: 7.4 g/dL (ref 6.0–8.3)

## 2021-09-30 LAB — LIPID PANEL
Cholesterol: 166 mg/dL (ref 0–200)
HDL: 72.5 mg/dL (ref >39.00)
LDL Cholesterol: 77 mg/dL (ref 0–99)
NonHDL: 93.64
Total CHOL/HDL Ratio: 2
Triglycerides: 82 mg/dL (ref 0.0–149.0)
VLDL: 16.4 mg/dL (ref 0.0–40.0)

## 2021-09-30 LAB — HEMOGLOBIN A1C: Hgb A1c MFr Bld: 7.7 % — ABNORMAL HIGH (ref 4.6–6.5)

## 2021-09-30 MED ORDER — POTASSIUM CHLORIDE CRYS ER 20 MEQ PO TBCR: 20.0000 meq | EXTENDED_RELEASE_TABLET | Freq: Every day | ORAL | 3 refills | Status: DC

## 2021-09-30 MED ORDER — TRULICITY 1.5 MG/0.5ML ~~LOC~~ SOAJ: 1.5000 mg | SUBCUTANEOUS | 2 refills | Status: DC

## 2021-09-30 NOTE — Telephone Encounter (Signed)
Lvm for pt to return call in regards to labs.  ?

## 2021-10-04 ENCOUNTER — Telehealth: Payer: Self-pay | Admitting: Internal Medicine

## 2021-10-04 NOTE — Telephone Encounter (Signed)
Patient would like to finish the 0.75 before starting the 1.5 mg Trulicity Okay to instruct patient she can take 2 of the 3.27 mg trulicity to equal the 1.5 mg okay? ?

## 2021-10-04 NOTE — Telephone Encounter (Signed)
Patient aware.

## 2021-10-04 NOTE — Telephone Encounter (Signed)
The patient stated that on her Trulicity from  ?

## 2021-10-04 NOTE — Telephone Encounter (Signed)
This is fine can my chart this ?

## 2021-10-07 ENCOUNTER — Telehealth: Payer: Self-pay | Admitting: Internal Medicine

## 2021-10-07 NOTE — Telephone Encounter (Signed)
Pt called in stating that she was supposed be given paper on potassium by provider and she said she had two prescriptions at [facility] that she does not know what they are for. Pt cannot get back in my chart. Pt would like to be called ?

## 2021-10-13 ENCOUNTER — Encounter: Payer: Self-pay | Admitting: Internal Medicine

## 2021-10-13 ENCOUNTER — Ambulatory Visit
Admission: RE | Admit: 2021-10-13 | Discharge: 2021-10-13 | Disposition: A | Discharge status: Home / Self Care | Payer: Medicare PPO | Source: Ambulatory Visit | Attending: Internal Medicine | Admitting: Internal Medicine

## 2021-10-13 ENCOUNTER — Ambulatory Visit: Payer: Medicare PPO | Admitting: Internal Medicine

## 2021-10-13 VITALS — BP 136/80 | HR 79 | Temp 98.3°F | Resp 14 | Ht 61.0 in | Wt 167.0 lb

## 2021-10-13 DIAGNOSIS — M5416 Radiculopathy, lumbar region: Secondary | ICD-10-CM | POA: Insufficient documentation

## 2021-10-13 DIAGNOSIS — R269 Unspecified abnormalities of gait and mobility: Secondary | ICD-10-CM | POA: Insufficient documentation

## 2021-10-13 DIAGNOSIS — R937 Abnormal findings on diagnostic imaging of other parts of musculoskeletal system: Secondary | ICD-10-CM

## 2021-10-13 DIAGNOSIS — M48061 Spinal stenosis, lumbar region without neurogenic claudication: Secondary | ICD-10-CM | POA: Diagnosis not present

## 2021-10-13 DIAGNOSIS — I739 Peripheral vascular disease, unspecified: Secondary | ICD-10-CM

## 2021-10-13 DIAGNOSIS — R410 Disorientation, unspecified: Secondary | ICD-10-CM

## 2021-10-13 DIAGNOSIS — R4182 Altered mental status, unspecified: Secondary | ICD-10-CM | POA: Diagnosis not present

## 2021-10-13 DIAGNOSIS — M069 Rheumatoid arthritis, unspecified: Secondary | ICD-10-CM | POA: Diagnosis not present

## 2021-10-13 DIAGNOSIS — M79652 Pain in left thigh: Secondary | ICD-10-CM | POA: Diagnosis not present

## 2021-10-13 DIAGNOSIS — M25552 Pain in left hip: Secondary | ICD-10-CM | POA: Diagnosis not present

## 2021-10-13 DIAGNOSIS — M5116 Intervertebral disc disorders with radiculopathy, lumbar region: Secondary | ICD-10-CM | POA: Diagnosis not present

## 2021-10-13 DIAGNOSIS — M21371 Foot drop, right foot: Secondary | ICD-10-CM | POA: Diagnosis not present

## 2021-10-13 DIAGNOSIS — G629 Polyneuropathy, unspecified: Secondary | ICD-10-CM

## 2021-10-13 HISTORY — DX: Unspecified abnormalities of gait and mobility: R26.9

## 2021-10-13 NOTE — Progress Notes (Addendum)
Chief Complaint  ?Patient presents with  ? Acute Visit  ?  Soreness in L thigh, ongoing for 2-3 days, radiates to hip area. Having issues with balance walking slowly. Saw ENT yesterday. Dr.Young Rheumatology prescribed her prednisone and leflunomide which she has not started.   ? ?Acute  ?Left thigh sore to touch needle/prickly pain x 2-3 days radiates to thigh/hip area and issues with balance and walking slower with cane. She is c/w neuropathy. Leaning to left and right and off balance and woobly x 2 months. 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 She was given RA meds prednisone, arava but has not started them yet  ?She also does have left knee OA mild per xray I n2023. Was given by rheum steroid shot which helped x 1-2 days.  ? ?She tried PT ended 08/2021 but did not help balance  ? ?H/o lumbar DDD and facet joint changes noted prior Xray. Back pain relieved with leaning forward and worse standing up straight.  ? ?H/o PAD and b/l fem artery stenting and in stent stenosis left in stent stenosis had A gram stent R SFA and left SFA occluded proximal and popliteal and per vascular no intervention rec unless rest pain or wounds but will need bypass per Dr. Trula Slade ? ?She has also noticed and daughter notices worsening confusion and chronic right drop drop and walks with cane at baseline  ? ?Review of Systems  ?Constitutional:  Negative for weight loss.  ?HENT:  Negative for hearing loss.   ?Eyes:  Negative for blurred vision.  ?Respiratory:  Negative for shortness of breath.   ?Cardiovascular:  Negative for chest pain.  ?Gastrointestinal:  Negative for abdominal pain and blood in stool.  ?Genitourinary:  Negative for dysuria.  ?Musculoskeletal:  Positive for back pain and joint pain. Negative for falls.  ?Skin:  Negative for rash.  ?Neurological:  Positive for weakness. Negative for headaches.  ?     +AMS ?+abnormal gait   ?Psychiatric/Behavioral:  Negative for depression.   ?Past Medical History:  ?Diagnosis Date   ? Arthritis   ? lumbar, cervical, cervical/lumbar spondylosis  ? 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 CAD (coronary artery disease)   ? Cardiac murmur   ? Carotid bruit   ? US carotid had 09/03/14 [place]   ? Carpal tunnel syndrome   ? Cataracts, bilateral   ? Cervical radiculopathy   ? improved since surgery 2016   ? Chicken pox   ? CKD (chronic kidney disease) stage 3, GFR 30-59 ml/min (HCC)   ? per Dr Vassie Moselle Su notes [place] noted 12/14/20  ? Colon polyp   ? 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 Loann Quill [facility]   ? COVID 19+   ? mid 11/2020  ? Degenerative disk disease   ? Diabetes ([place])   ? DVT (deep venous thrombosis) ([place])   ? DVT, lower extremity ([place])   ? left, 05/2013 on Xarelto x 1 month etiology unkown   ? GERD (gastroesophageal reflux disease)   ? Gout   ? H/O myocardial perfusion scan   ? 06/08/20 low risk  ? Hammer toes, bilateral   ? HSV-2 infection   ? Hyperlipidemia   ? Hypertension   ? Insomnia   ? Intertrigo   ? Lumbar radiculopathy   ? improved after steroid inj and PT 10/21/20 had right L4/5 L5-S1 transforaminal epidural steroid injections  ? Onychomycosis   ? OSA on CPAP   ? dx  in 2018/08/12 after hip surgery CPAP 9-13 cm H20 supplies [employer] PSG 03/18/2019 severe OSA AHI 42.1/hr titration 03/27/2019 rec APAP 9-13 cm H20  ? PAD (peripheral artery disease) ([place])   ? 1 x right, 3 x left in 2020-2022 needs bypass on leg had CTA with run off 11/2020 in [place]  ? PAD (peripheral artery disease) ([place])   ? with stents  ? Peripheral vascular disease ([place])   ? Right foot drop   ? since 08/12/2018  ? Subdeltoid bursitis   ? Tinea pedis   ? Vitamin D deficiency   ? ?Past Surgical History:  ?Procedure Laterality Date  ? ABDOMINAL AORTOGRAM W/LOWER EXTREMITY N/A 06/01/2021  ? Procedure: ABDOMINAL AORTOGRAM W/LOWER EXTREMITY;  Surgeon: Serafina Mitchell, MD;  Location: [facility];  Service: Cardiovascular;  Laterality: N/A;  ? BACK SURGERY    ? cervical laminoplasty C3-C7 Rex  Dr. 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 COLON SURGERY    ? colectomy in 08-12-13  ? COLONOSCOPY  05/01/2020  ? [place] [facility] [place] [facility] Dr. Edwena Bunde [place] 12 mm d colon polyp, grade 1 IH, moderate diverticulosis tubular  ? ESOPHAGOGASTRODUODENOSCOPY  05/01/2020  ? erosive gastritis Dr. Langley [place]  ? PERIPHERAL VASCULAR INTERVENTION  06/01/2021  ? Procedure: PERIPHERAL VASCULAR INTERVENTION;  Surgeon: Serafina Mitchell, MD;  Location: [facility];  Service: Cardiovascular;;  RT. SFA  ? right total hip Right   ? 03/05/2019 or 02/26/2020  ? TUBAL LIGATION    ? UPPER GI ENDOSCOPY    ? 05/01/20  ? VASCULAR SURGERY    ? 08/19/19  ? VASCULAR SURGERY    ? 07/05/19  ? VASCULAR SURGERY    ? 05/21/2019 angioplasty since 05/2019, 06/2019 and 08/2019 had 2 left leg and 1 on right leg  ? ?Family History  ?Problem Relation Age of Onset  ? Hypertension Mother   ? Arthritis Mother   ? Cancer Father   ?     colon cancer dx'ed died age 67  ? Hypertension Sister   ? Arthritis Sister   ? Cancer Sister   ?     died in 67s brain tumor h/o lung cancer smoker died 08-13-15   ? Early death Sister   ? Breast cancer Maternal Grandmother   ? Cancer Maternal Grandmother   ?     breast dx older age   ? Hypertension Maternal Grandmother   ? Breast cancer Other   ?     maternal neice  ? Cancer Other   ?     died in 36s   ? Hypertension Daughter   ? Hypertension Daughter   ? ?Social History  ? ?Socioeconomic History  ? Marital status: Divorced  ?  Spouse name: Not on file  ? Number of children: Not on file  ? Years of education: Not on file  ? Highest education level: Not on file  ?Occupational History  ? Not on file  ?Tobacco Use  ? Smoking status: Former  ?  Packs/day: 1.00  ?  Years: 45.00  ?  Pack years: 45.00  ?  Types: Cigarettes  ?  Quit date: 12/18/2012  ?  Years since quitting: 8.8  ? Smokeless tobacco: Never  ?Vaping Use  ? Vaping Use: Never used  ?Substance and Sexual Activity  ? Alcohol use: No   ? Drug use: Yes  ?  Frequency: 3.0 times per week  ? Sexual activity: Not on file  ?  Other Topics Concern  ? Not on file  ?Social History Narrative  ? Divorced   ? 2 daughters [name] comes to most appts and is lawyer   ? -lives with daughter [name]   ? Used to work for [employer] now [employer]   ? Former smoker age 4 to age 76/2012 quit for sure in 2014 max 1/2 ppd FH lung cancer sister also smoker   ? ?Social Determinants of Health  ? ?Financial Resource Strain: Not on file  ?Food Insecurity: No Food Insecurity  ? Worried About Charity fundraiser in the Last Year: Never true  ? Ran Out of Food in the Last Year: Never true  ?Transportation Needs: No Transportation Needs  ? Lack of Transportation (Medical): No  ? Lack of Transportation (Non-Medical): No  ?Physical Activity: Insufficiently Active  ? Days of Exercise per Week: 1 day  ? Minutes of Exercise per Session: 60 min  ?Stress: No Stress Concern Present  ? Feeling of Stress : Not at all  ?Social Connections: Unknown  ? Frequency of Communication with Friends and Family: More than three times a week  ? Frequency of Social Gatherings with Friends and Family: More than three times a week  ? Attends Religious Services: More than 4 times per year  ? Active Member of Clubs or Organizations: Not on file  ? Attends Archivist Meetings: More than 4 times per year  ? Marital Status: Not on file  ?Intimate Partner Violence: Not At Risk  ? Fear of Current or Ex-Partner: No  ? Emotionally Abused: No  ? Physically Abused: No  ? Sexually Abused: No  ? ?Current Meds  ?Medication Sig  ? acetaminophen (TYLENOL) 500 MG tablet Take 1,000 mg by mouth 2 (two) times daily as needed for moderate pain or mild pain. Rapid release  ? amLODipine (NORVASC) 5 MG tablet TAKE 1 TABLET BY MOUTH EVERY DAY  ? aspirin EC 81 MG tablet Take 1 tablet (81 mg total) by mouth daily. Swallow whole.  ? B-D UF III MINI PEN NEEDLES 31G X 5 MM MISC Inject 1 each into the skin 2 (two)  times daily. Or preferred choice of the patient  ? Blood Glucose Monitoring Suppl (ONE TOUCH ULTRA 2) w/Device KIT Used to check blood sugars daily.  ? Cholecalciferol (VITAMIN D3) 50 MCG (2000 UT) cap

## 2021-10-13 NOTE — Patient Instructions (Addendum)
Lets see how prednisone helps with left thigh pain ? ?Dr. Trula Slade ? ?Phone Fax E-mail Address  ?[phone] [email or web address] [address]  ? [place] [address]  ? ?Peripheral Vascular Disease ? ?Peripheral vascular disease (PVD) is a disease of the blood vessels. PVD may also be called peripheral artery disease (PAD) or poor circulation. PVD is the blocking or hardening of the arteries anywhere within the circulatory system beyond the heart. This can result in a decreased supply of blood to the arms, legs, and internal organs, such as the stomach or kidneys. However, PVD most often affects a person's lower legs and feet. Without treatment, PVD often worsens. ?PVD can lead to acute limb ischemia. This occurs when an arm or leg suddenly has trouble getting enough blood. This is a medical emergency. ?What are the causes? ?The most common cause of PVD is atherosclerosis. This is a buildup of fatty material and other substances (plaque)inside your arteries. Pieces of plaque can break off from the walls of an artery and become stuck in a smaller artery, blocking blood flow and possibly causing acute limb ischemia. ?Other common causes of PVD include: ?Blood clots that form inside the blood vessels. ?Injuries to blood vessels. ?Diseases that cause inflammation of blood vessels or cause blood vessel tightening (spasms). ?What increases the risk? ?The following factors may make you more likely to develop this condition: ?A family history of PVD. ?Common medical conditions, including: ?High cholesterol. ?Diabetes. ?High blood pressure (hypertension). ?Heart disease. ?Known atherosclerotic disease in another area of the body. ?Past injury, such as burns or a broken bone. ?Other medical conditions, such as: ?Buerger's disease. This is caused by inflamed blood vessels in your hands and feet. ?Some forms of arthritis. ?Birth defects that affect the arteries in your legs. ?Kidney disease. ?Using tobacco  and nicotine products. ?Not getting enough exercise. ?Obesity. ?Being age 50 or older, or being age 22 or older and having the other risk factors. ?What are the signs or symptoms? ?This condition may cause different symptoms. Your symptoms depend on what body part is not getting enough blood. Common signs and symptoms include: ?Cramps in your buttocks, legs, and feet. ?Intermittent claudication. This is pain and weakness in your legs during activity that resolves with rest. ?Leg pain at rest and leg numbness, tingling, or weakness. ?Coldness in a leg or foot, especially when compared to the other leg or foot. ?Skin or hair changes. These can include: ?Hair loss. ?Shiny skin. ?Pale or bluish skin. ?Thick toenails. ?Inability to get or maintain an erection (erectile dysfunction). ?Tiredness (fatigue). ?Weak pulse or no pulse in the feet. ?People with PVD are more likely to develop open wounds (ulcers) and sores on their toes, feet, or legs. The ulcers or sores may take longer than normal to heal. ?How is this diagnosed? ?PVD is diagnosed based on your signs and symptoms, a physical exam, and your medical history. You may also have other tests to find the cause. Tests include: ?Ankle-brachial index test.This test compares the blood pressure readings of the legs and arms. ?This may also include an exercise ankle-brachial index test in which you walk on a treadmill to check your symptoms. ?Doppler ultrasound. This takes pictures of blood flow through your blood vessels. ?Imaging studies that use dye to show blood flow. These are: ?CT angiogram. ?Magnetic resonance angiogram, or MRA. ?How is this treated? ?Treatment for PVD depends on the cause of your condition, how severe your symptoms are, and your age. Underlying causes need  to be treated and controlled. These include long-term (chronic) conditions, such as diabetes, high cholesterol, and hypertension. Treatment may include: ?Lifestyle changes, such as: ?Quitting  tobacco use. ?Exercising regularly. ?Following a low-fat, low-cholesterol diet. ?Not drinking alcohol. ?Taking medicines, such as: ?Blood thinners to prevent blood clots. ?Medicines to improve blood flow. ?Medicines to improve cholesterol levels. ?Procedures, such as: ?Angioplasty. This uses an inflated balloon to open a blocked artery and improve blood flow. ?Stent implant. This inserts a small mesh tube to keep a blocked artery open. ?Peripheral bypass surgery. This reroutes blood flow around a blocked artery. ?Surgery to remove dead tissue from an infected wound (debridement). ?Amputation. This is surgical removal of the affected limb. It may be necessary in cases of acute limb ischemia when medical or surgical treatments have not helped. ?Follow these instructions at home: ?Medicines ?Take over-the-counter and prescription medicines only as told by your health care provider. ?If you are taking blood thinners: ?Talk with your health care provider before you take any medicines that contain aspirin or NSAIDs, such as ibuprofen. These medicines increase your risk for dangerous bleeding. ?Take your medicine exactly as told, at the same time every day. ?Avoid activities that could cause injury or bruising, and follow instructions about how to prevent falls. ?Wear a medical alert bracelet or carry a card that lists what medicines you take. ?Lifestyle ? ?  ? ?Exercise regularly. Ask your health care provider about some good activities for you. ?Talk with your health care provider about maintaining a healthy weight. If needed, ask about losing weight. ?Eat a diet that is low in fat and cholesterol. If you need help, talk with your health care provider. ?Do not drink alcohol. ?Do not use any products that contain nicotine or tobacco. These products include cigarettes, chewing tobacco, and vaping devices, such as e-cigarettes. If you need help quitting, ask your health care provider. ?General instructions ?Take good care  of your feet. To do this: ?Wear comfortable shoes that fit well. ?Check your feet often for any cuts or sores. ?Get an annual influenza vaccine. ?Keep all follow-up visits. This is important. ?Where to find more information ?Society for Vascular Surgery: vascular.org ?American Heart Association: heart.org ?National Heart, Lung, and Blood Institute: https://www.hartman-hill.biz/ ?Contact a health care provider if: ?You have leg cramps while walking. ?You have leg pain when you rest. ?Your leg or foot feels cold. ?Your skin changes color. ?You have erectile dysfunction. ?You have cuts or sores on your legs or feet that do not heal. ?Get help right away if: ?You have sudden changes in color and feeling of your arms or legs, such as: ?Your arm or leg turns cold, numb, and blue. ?Your arm or leg becomes red, warm, swollen, painful, or numb. ?You have any symptoms of a stroke. "BE FAST" is an easy way to remember the main warning signs of a stroke: ?B - Balance. Signs are dizziness, sudden trouble walking, or loss of balance. ?E - Eyes. Signs are trouble seeing or a sudden change in vision. ?F - Face. Signs are sudden weakness or numbness of the face, or the face or eyelid drooping on one side. ?A - Arms. Signs are weakness or numbness in an arm. This happens suddenly and usually on one side of the body. ?S - Speech. Signs are sudden trouble speaking, slurred speech, or trouble understanding what people say. ?T - Time. Time to call emergency services. Write down what time symptoms started. ?You have other signs of a stroke, such  as: ?A sudden, severe headache with no known cause. ?Nausea or vomiting. ?Seizure. ?You have chest pain or trouble breathing. ?These symptoms may represent a serious problem that is an emergency. Do not wait to see if the symptoms will go away. Get medical help right away. Call your local emergency services (911 in the [place] Do not drive yourself to the hospital. ?Summary ?Peripheral vascular disease (PVD) is a  disease of the blood vessels. ?PVD is the blocking or hardening of the arteries anywhere within the circulatory system beyond the heart. ?PVD may cause different symptoms. Your symptoms depend on what par

## 2021-10-14 ENCOUNTER — Telehealth: Payer: Self-pay | Admitting: *Deleted

## 2021-10-14 ENCOUNTER — Telehealth: Payer: Self-pay | Admitting: Internal Medicine

## 2021-10-14 ENCOUNTER — Encounter: Payer: Self-pay | Admitting: Internal Medicine

## 2021-10-14 DIAGNOSIS — M48061 Spinal stenosis, lumbar region without neurogenic claudication: Secondary | ICD-10-CM | POA: Insufficient documentation

## 2021-10-14 HISTORY — DX: Spinal stenosis, lumbar region without neurogenic claudication: M48.061

## 2021-10-14 NOTE — Telephone Encounter (Signed)
error 

## 2021-10-14 NOTE — Telephone Encounter (Signed)
Patient called and states she has been having some knee pain. She was seen by her primary Dr who did xray and MRI and told her it was arthritis and placed her on prednisone. She denies any swelling,redness or cold leg. She is complaining of some tingling and burning sensation. Patient daughter states patient has history of Neuropathy and was unable to take Gabapentin. Patient was asking if she should come here for evaluation sooner due to her symptoms. Advised patient to continue as directed by her PCP. Advised her if she develops worsening symptoms she can call and we will get her scheduled to be evaluated here.  ?

## 2021-10-18 DIAGNOSIS — Z7409 Other reduced mobility: Secondary | ICD-10-CM | POA: Diagnosis not present

## 2021-10-18 DIAGNOSIS — M1990 Unspecified osteoarthritis, unspecified site: Secondary | ICD-10-CM | POA: Diagnosis not present

## 2021-10-18 DIAGNOSIS — Z6832 Body mass index (BMI) 32.0-32.9, adult: Secondary | ICD-10-CM | POA: Diagnosis not present

## 2021-10-18 DIAGNOSIS — E785 Hyperlipidemia, unspecified: Secondary | ICD-10-CM | POA: Diagnosis not present

## 2021-10-18 DIAGNOSIS — E1151 Type 2 diabetes mellitus with diabetic peripheral angiopathy without gangrene: Secondary | ICD-10-CM | POA: Diagnosis not present

## 2021-10-18 DIAGNOSIS — E669 Obesity, unspecified: Secondary | ICD-10-CM | POA: Diagnosis not present

## 2021-10-18 DIAGNOSIS — I10 Essential (primary) hypertension: Secondary | ICD-10-CM | POA: Diagnosis not present

## 2021-10-18 DIAGNOSIS — I4891 Unspecified atrial fibrillation: Secondary | ICD-10-CM | POA: Diagnosis not present

## 2021-10-18 DIAGNOSIS — Z794 Long term (current) use of insulin: Secondary | ICD-10-CM | POA: Diagnosis not present

## 2021-10-21 ENCOUNTER — Ambulatory Visit: Payer: Medicare PPO | Admitting: Interventional Cardiology

## 2021-10-22 ENCOUNTER — Telehealth: Payer: Self-pay | Admitting: Internal Medicine

## 2021-10-22 ENCOUNTER — Encounter: Payer: Self-pay | Admitting: Internal Medicine

## 2021-10-22 DIAGNOSIS — R937 Abnormal findings on diagnostic imaging of other parts of musculoskeletal system: Secondary | ICD-10-CM

## 2021-10-22 DIAGNOSIS — M48061 Spinal stenosis, lumbar region without neurogenic claudication: Secondary | ICD-10-CM

## 2021-10-22 HISTORY — DX: Spinal stenosis, lumbar region without neurogenic claudication: M48.061

## 2021-10-22 HISTORY — DX: Abnormal findings on diagnostic imaging of other parts of musculoskeletal system: R93.7

## 2021-10-22 NOTE — Addendum Note (Signed)
Addended by: Orland Mustard on: 10/22/2021 04:57 PM ? ? Modules accepted: Orders ? ?

## 2021-10-22 NOTE — Telephone Encounter (Signed)
Pt called in stating that she spoke with someone last week about getting a referral for neurology... Pt stated that she hasn't heard from anyone... Pt is wondering if the referral for neurology was placed.... Pt requesting callback ?

## 2021-10-22 NOTE — Addendum Note (Signed)
Addended by: Orland Mustard on: 10/22/2021 05:08 PM ? ? Modules accepted: Orders ? ?

## 2021-11-02 ENCOUNTER — Ambulatory Visit: Payer: Medicare PPO | Admitting: Nurse Practitioner

## 2021-11-02 ENCOUNTER — Encounter: Payer: Self-pay | Admitting: Nurse Practitioner

## 2021-11-02 VITALS — BP 134/80 | HR 75 | Ht 61.0 in | Wt 163.8 lb

## 2021-11-02 DIAGNOSIS — I700 Atherosclerosis of aorta: Secondary | ICD-10-CM

## 2021-11-02 DIAGNOSIS — I739 Peripheral vascular disease, unspecified: Secondary | ICD-10-CM | POA: Diagnosis not present

## 2021-11-02 DIAGNOSIS — E785 Hyperlipidemia, unspecified: Secondary | ICD-10-CM | POA: Diagnosis not present

## 2021-11-02 DIAGNOSIS — I70213 Atherosclerosis of native arteries of extremities with intermittent claudication, bilateral legs: Secondary | ICD-10-CM | POA: Diagnosis not present

## 2021-11-02 DIAGNOSIS — I2510 Atherosclerotic heart disease of native coronary artery without angina pectoris: Secondary | ICD-10-CM

## 2021-11-02 DIAGNOSIS — N183 Chronic kidney disease, stage 3 unspecified: Secondary | ICD-10-CM | POA: Diagnosis not present

## 2021-11-02 DIAGNOSIS — E876 Hypokalemia: Secondary | ICD-10-CM | POA: Diagnosis not present

## 2021-11-02 DIAGNOSIS — I10 Essential (primary) hypertension: Secondary | ICD-10-CM

## 2021-11-02 NOTE — Progress Notes (Signed)
?Cardiology Office Note:   ? ?Date:  11/02/2021  ? ?ID:  Natalie Watts, DOB [birth year], MRN [id number] ? ?PCP:  McLean-Scocuzza, Nino Glow, MD ?  ?[facility] Providers ?Cardiologist:  Sinclair Grooms, MD    ? ?Referring MD: McLean-Scocuzza, Olivia Mackie *  ? ?Chief Complaint: 6 month follow-up ? ?History of Present Illness:   ? ?Natalie Watts is a very pleasant 76 y.o. female with a hx of hypertension, CAD, LVH, diabetes, right foot drop from ruptured disc, CKD stage 3, former tobacco abuse, hyperlipidemia, and OSA on CPAP. ? ?History of asymptomatic coronary atherosclerosis with prior echo in [place].  At least 2 prior myocardial perfusion scans that did not lead to further investigation per report from patient. Upcoming abdominal aortography and lower extremity angiography percutaneous intervention of totally occluded superficial femoral artery stent by Dr. Trula Slade.  ? ?She was last seen in our office on 05/28/2021 by Dr. Tamala Julian.   She was having shortness of breath and underwent echocardiogram LVEF 70 to 75%, mild concentric LVH, G1 DD, elevated left ventricular end-diastolic pressure. She was advised to start Jardiance based on diastolic heart failure but developed a yeast infection.  Intolerant of Farxiga in the past due to diarrhea.  Was to follow-up in 4 to 6 months. ? ?Today, she is here with her niece. Reports pain in left leg 2/2 arthritis and recent skin ulcer. Just finished a course of prednisone.  Shortness of breath that she described to Dr. Tamala Julian in December has improved since being on losartan.  Recently started on potassium supplement due to hypokalemia.  Is most bothered by difficulty walking. Is "wobbling" and feeling off balance.  Uses a walker for security.  She lives with her son and his family and is able to go up and down stairs but on a very limited basis. Does not monitor BP at home but no report of elevated BP at appointments with other providers. She denies chest pain, lower extremity edema,  fatigue, palpitations, melena, hematuria, hemoptysis, diaphoresis, presyncope, syncope, orthopnea, and PND. ? ?Past Medical History:  ?Diagnosis Date  ? Arthritis   ? lumbar, cervical, cervical/lumbar spondylosis  ? Blood clotting disorder ([place])   ? CAD (coronary artery disease)   ? Cardiac murmur   ? Carotid bruit   ? US carotid had 09/03/14 [place]   ? Carpal tunnel syndrome   ? Cataracts, bilateral   ? Cervical radiculopathy   ? improved since surgery 2016   ? Chicken pox   ? CKD (chronic kidney disease) stage 3, GFR 30-59 ml/min (HCC)   ? per Dr Vassie Moselle Su notes [place] noted 12/14/20  ? Colon polyp   ? 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 Loann Quill [place]   ? COVID 19+   ? mid 11/2020  ? Degenerative disk disease   ? Diabetes ([place])   ? DVT (deep venous thrombosis) ([place])   ? DVT, lower extremity ([address])   ? left, 05/2013 on Xarelto x 1 month etiology unkown   ? GERD (gastroesophageal reflux disease)   ? Gout   ? H/O myocardial perfusion scan   ? 06/08/20 low risk  ? Hammer toes, bilateral   ? HSV-2 infection   ? Hyperlipidemia   ? Hypertension   ? Insomnia   ? Intertrigo   ? Lumbar radiculopathy   ? improved after steroid inj and PT 10/21/20 had right L4/5 L5-S1 transforaminal epidural steroid injections  ? Onychomycosis   ? OSA  on CPAP   ? dx in 2020 after hip surgery CPAP 9-13 cm H20 supplies [employer] PSG 03/18/2019 severe OSA AHI 42.1/hr titration 03/27/2019 rec APAP 9-13 cm H20  ? PAD (peripheral artery disease) ([place])   ? 1 x right, 3 x left in 2020-2022 needs bypass on leg had CTA with run off 11/2020 in [place]  ? PAD (peripheral artery disease) ([place])   ? with stents  ? Peripheral vascular disease ([place])   ? Right foot drop   ? since 2020  ? Subdeltoid bursitis   ? Tinea pedis   ? Vitamin D deficiency   ? ? ?Past Surgical History:  ?Procedure Laterality Date  ? ABDOMINAL AORTOGRAM W/LOWER EXTREMITY N/A 06/01/2021  ? Procedure: ABDOMINAL AORTOGRAM W/LOWER EXTREMITY;  Surgeon:  Serafina Mitchell, MD;  Location: [facility];  Service: Cardiovascular;  Laterality: N/A;  ? BACK SURGERY    ? cervical laminoplasty C3-C7 Rex Dr. 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 COLON SURGERY    ? colectomy in 2015  ? COLONOSCOPY  05/01/2020  ? [place] [facility] [place] [facility] Dr. Edwena Bunde [place] 12 mm d colon polyp, grade 1 IH, moderate diverticulosis tubular  ? ESOPHAGOGASTRODUODENOSCOPY  05/01/2020  ? erosive gastritis Dr. Langley [place]  ? PERIPHERAL VASCULAR INTERVENTION  06/01/2021  ? Procedure: PERIPHERAL VASCULAR INTERVENTION;  Surgeon: Serafina Mitchell, MD;  Location: [facility];  Service: Cardiovascular;;  RT. SFA  ? right total hip Right   ? 03/05/2019 or 02/26/2020  ? TUBAL LIGATION    ? UPPER GI ENDOSCOPY    ? 05/01/20  ? VASCULAR SURGERY    ? 08/19/19  ? VASCULAR SURGERY    ? 07/05/19  ? VASCULAR SURGERY    ? 05/21/2019 angioplasty since 05/2019, 06/2019 and 08/2019 had 2 left leg and 1 on right leg  ? ? ?Current Medications: ?Current Meds  ?Medication Sig  ? acetaminophen (TYLENOL) 500 MG tablet Take 1,000 mg by mouth 2 (two) times daily as needed for moderate pain or mild pain. Rapid release  ? amLODipine (NORVASC) 5 MG tablet TAKE 1 TABLET BY MOUTH EVERY DAY  ? aspirin EC 81 MG tablet Take 1 tablet (81 mg total) by mouth daily. Swallow whole.  ? B-D UF III MINI PEN NEEDLES 31G X 5 MM MISC Inject 1 each into the skin 2 (two) times daily. Or preferred choice of the patient  ? Blood Glucose Monitoring Suppl (ONE TOUCH ULTRA 2) w/Device KIT Used to check blood sugars daily.  ? Cholecalciferol (VITAMIN D3) 50 MCG (2000 UT) capsule Take 2,000 Units by mouth daily.  ? clobetasol cream (TEMOVATE) 3.33 % Apply 1 application topically 2 (two) times daily.  ? clopidogrel (PLAVIX) 75 MG tablet TAKE 1 TABLET BY MOUTH EVERY DAY  ? Continuous Blood Gluc Receiver (FREESTYLE LIBRE 2 READER) DEVI 1 Device by Does not apply route every 14 (fourteen) days.  ?  Continuous Blood Gluc Sensor (FREESTYLE LIBRE 2 SENSOR) MISC 1 each by Does not apply route every 14 (fourteen) days.  ? Dulaglutide (TRULICITY) 1.5 LK/5.6YB SOPN Inject 1.5 mg into the skin once a week. D/c 0.75  ? ezetimibe (ZETIA) 10 MG tablet Take 1 tablet (10 mg total) by mouth daily.  ? glucose blood test strip Use 2 times daily before meals. Choice for meter  ? Lancets Ultra Fine MISC 1 Device by Does not apply route 2 (two) times daily as needed. Preferred for her device  ?  LANTUS SOLOSTAR 100 UNIT/ML Solostar Pen Inject 40 Units into the skin daily.  ? leflunomide (ARAVA) 20 MG tablet Take 20 mg by mouth daily. rheumatology  ? losartan (COZAAR) 50 MG tablet Take 1 tablet (50 mg total) by mouth daily.  ? nebivolol (BYSTOLIC) 5 MG tablet Take 1 tablet (5 mg total) by mouth daily. Dc tenoretic  ? potassium chloride SA (KLOR-CON M) 20 MEQ tablet Take 1 tablet (20 mEq total) by mouth daily.  ? pravastatin (PRAVACHOL) 40 MG tablet Take 1 tablet (40 mg total) by mouth daily. At night  ? XARELTO 2.5 MG TABS tablet Take 2.5 mg by mouth 2 (two) times daily.  ?  ? ?Allergies:   Lisinopril, Sulfamethoxazole-trimethoprim, Farxiga [dapagliflozin], Gabapentin, Glipizide, Metformin and related, Penicillins, and Tramadol  ? ?Social History  ? ?Socioeconomic History  ? Marital status: Divorced  ?  Spouse name: Not on file  ? Number of children: Not on file  ? Years of education: Not on file  ? Highest education level: Not on file  ?Occupational History  ? Not on file  ?Tobacco Use  ? Smoking status: Former  ?  Packs/day: 1.00  ?  Years: 45.00  ?  Pack years: 45.00  ?  Types: Cigarettes  ?  Quit date: 12/18/2012  ?  Years since quitting: 8.8  ? Smokeless tobacco: Never  ?Vaping Use  ? Vaping Use: Never used  ?Substance and Sexual Activity  ? Alcohol use: No  ? Drug use: Yes  ?  Frequency: 3.0 times per week  ? Sexual activity: Not on file  ?Other Topics Concern  ? Not on file  ?Social History Narrative  ? Divorced   ? 2  daughters [name] comes to most appts and is lawyer   ? -lives with daughter [name]   ? Used to work for [employer] now [employer]   ? Former smoker age 82 to age 76/2012 quit for sure in 2014 max 1/2

## 2021-11-02 NOTE — Patient Instructions (Signed)
Medication Instructions:  ? ?Your physician recommends that you continue on your current medications as directed. Please refer to the Current Medication list given to you today. ? ?*If you need a refill on your cardiac medications before your next appointment, please call your pharmacy* ? ? ?Lab Work: ? ?TODAY!!!!! BMET ? ?If you have labs (blood work) drawn today and your tests are completely normal, you will receive your results only by: ?MyChart Message (if you have MyChart) OR ?A paper copy in the mail ?If you have any lab test that is abnormal or we need to change your treatment, we will call you to review the results. ? ? ?Testing/Procedures: ? ?None ordered. ? ? ?Follow-Up: ?At [facility], you and your health needs are our priority.  As part of our continuing mission to provide you with exceptional heart care, we have created designated Provider Care Teams.  These Care Teams include your primary Cardiologist (physician) and Advanced Practice Providers (APPs -  Physician Assistants and Nurse Practitioners) who all work together to provide you with the care you need, when you need it. ? ?We recommend signing up for the patient portal called "MyChart".  Sign up information is provided on this After Visit Summary.  MyChart is used to connect with patients for Virtual Visits (Telemedicine).  Patients are able to view lab/test results, encounter notes, upcoming appointments, etc.  Non-urgent messages can be sent to your provider as well.   ?To learn more about what you can do with MyChart, go to [email or web address].   ? ?Your next appointment:   ?6 month(s) ? ?The format for your next appointment:   ?In Person ? ?Provider:   ?Sinclair Grooms, MD   ? ? ?Important Information About Sugar ? ? ? ? ?  ?

## 2021-11-03 LAB — BASIC METABOLIC PANEL
BUN/Creatinine Ratio: 15 (ref 12–28)
BUN: 17 mg/dL (ref 8–27)
CO2: 22 mmol/L (ref 20–29)
Calcium: 9.8 mg/dL (ref 8.7–10.3)
Chloride: 108 mmol/L — ABNORMAL HIGH (ref 96–106)
Creatinine, Ser: 1.1 mg/dL — ABNORMAL HIGH (ref 0.57–1.00)
Glucose: 107 mg/dL — ABNORMAL HIGH (ref 70–99)
Potassium: 4.8 mmol/L (ref 3.5–5.2)
Sodium: 145 mmol/L — ABNORMAL HIGH (ref 134–144)
eGFR: 52 mL/min/{1.73_m2} — ABNORMAL LOW (ref >59)

## 2021-11-04 DIAGNOSIS — M4714 Other spondylosis with myelopathy, thoracic region: Secondary | ICD-10-CM | POA: Diagnosis not present

## 2021-11-04 DIAGNOSIS — M5104 Intervertebral disc disorders with myelopathy, thoracic region: Secondary | ICD-10-CM | POA: Diagnosis not present

## 2021-11-04 DIAGNOSIS — G959 Disease of spinal cord, unspecified: Secondary | ICD-10-CM | POA: Diagnosis not present

## 2021-11-05 ENCOUNTER — Other Ambulatory Visit: Payer: Self-pay | Admitting: Neurosurgery

## 2021-11-05 ENCOUNTER — Other Ambulatory Visit: Payer: Self-pay | Admitting: *Deleted

## 2021-11-05 DIAGNOSIS — M5104 Intervertebral disc disorders with myelopathy, thoracic region: Secondary | ICD-10-CM

## 2021-11-05 DIAGNOSIS — M4714 Other spondylosis with myelopathy, thoracic region: Secondary | ICD-10-CM

## 2021-11-05 DIAGNOSIS — G959 Disease of spinal cord, unspecified: Secondary | ICD-10-CM

## 2021-11-05 DIAGNOSIS — E876 Hypokalemia: Secondary | ICD-10-CM

## 2021-11-05 MED ORDER — POTASSIUM CHLORIDE CRYS ER 20 MEQ PO TBCR: 20.0000 meq | EXTENDED_RELEASE_TABLET | ORAL | 3 refills | Status: DC

## 2021-11-08 NOTE — Addendum Note (Signed)
Addended by: Orland Mustard on: 11/08/2021 02:58 PM   Modules accepted: Orders

## 2021-11-09 DIAGNOSIS — M4714 Other spondylosis with myelopathy, thoracic region: Secondary | ICD-10-CM | POA: Diagnosis not present

## 2021-11-11 DIAGNOSIS — M0579 Rheumatoid arthritis with rheumatoid factor of multiple sites without organ or systems involvement: Secondary | ICD-10-CM | POA: Diagnosis not present

## 2021-11-11 DIAGNOSIS — M25562 Pain in left knee: Secondary | ICD-10-CM | POA: Diagnosis not present

## 2021-11-11 DIAGNOSIS — M1991 Primary osteoarthritis, unspecified site: Secondary | ICD-10-CM | POA: Diagnosis not present

## 2021-11-11 DIAGNOSIS — Z6830 Body mass index (BMI) 30.0-30.9, adult: Secondary | ICD-10-CM | POA: Diagnosis not present

## 2021-11-11 DIAGNOSIS — E669 Obesity, unspecified: Secondary | ICD-10-CM | POA: Diagnosis not present

## 2021-11-11 DIAGNOSIS — M79641 Pain in right hand: Secondary | ICD-10-CM | POA: Diagnosis not present

## 2021-11-11 DIAGNOSIS — M79642 Pain in left hand: Secondary | ICD-10-CM | POA: Diagnosis not present

## 2021-11-11 DIAGNOSIS — G8929 Other chronic pain: Secondary | ICD-10-CM | POA: Diagnosis not present

## 2021-11-17 ENCOUNTER — Other Ambulatory Visit: Payer: Self-pay | Admitting: Internal Medicine

## 2021-11-19 ENCOUNTER — Encounter: Payer: Self-pay | Admitting: Surgery

## 2021-11-25 ENCOUNTER — Ambulatory Visit
Admission: RE | Admit: 2021-11-25 | Discharge: 2021-11-25 | Disposition: A | Discharge status: Home / Self Care | Payer: Medicare PPO | Source: Ambulatory Visit | Attending: Neurosurgery | Admitting: Neurosurgery

## 2021-11-25 DIAGNOSIS — M4802 Spinal stenosis, cervical region: Secondary | ICD-10-CM | POA: Diagnosis not present

## 2021-11-25 DIAGNOSIS — G959 Disease of spinal cord, unspecified: Secondary | ICD-10-CM | POA: Insufficient documentation

## 2021-11-25 DIAGNOSIS — M4714 Other spondylosis with myelopathy, thoracic region: Secondary | ICD-10-CM | POA: Insufficient documentation

## 2021-11-25 DIAGNOSIS — M5124 Other intervertebral disc displacement, thoracic region: Secondary | ICD-10-CM | POA: Diagnosis not present

## 2021-11-25 DIAGNOSIS — M4804 Spinal stenosis, thoracic region: Secondary | ICD-10-CM | POA: Diagnosis not present

## 2021-11-25 DIAGNOSIS — M48061 Spinal stenosis, lumbar region without neurogenic claudication: Secondary | ICD-10-CM | POA: Diagnosis not present

## 2021-11-25 DIAGNOSIS — M5104 Intervertebral disc disorders with myelopathy, thoracic region: Secondary | ICD-10-CM | POA: Insufficient documentation

## 2021-11-25 DIAGNOSIS — R531 Weakness: Secondary | ICD-10-CM | POA: Diagnosis not present

## 2021-12-01 ENCOUNTER — Other Ambulatory Visit: Payer: Self-pay | Admitting: Internal Medicine

## 2021-12-01 DIAGNOSIS — E1159 Type 2 diabetes mellitus with other circulatory complications: Secondary | ICD-10-CM

## 2021-12-01 DIAGNOSIS — E1165 Type 2 diabetes mellitus with hyperglycemia: Secondary | ICD-10-CM

## 2021-12-03 ENCOUNTER — Other Ambulatory Visit: Payer: Self-pay | Admitting: Internal Medicine

## 2021-12-06 DIAGNOSIS — N1832 Chronic kidney disease, stage 3b: Secondary | ICD-10-CM | POA: Diagnosis not present

## 2021-12-08 ENCOUNTER — Ambulatory Visit: Payer: Medicare PPO | Admitting: Neurology

## 2021-12-08 ENCOUNTER — Encounter: Payer: Self-pay | Admitting: Neurology

## 2021-12-08 VITALS — BP 140/69 | HR 67

## 2021-12-08 DIAGNOSIS — M4804 Spinal stenosis, thoracic region: Secondary | ICD-10-CM | POA: Insufficient documentation

## 2021-12-08 DIAGNOSIS — R269 Unspecified abnormalities of gait and mobility: Secondary | ICD-10-CM | POA: Insufficient documentation

## 2021-12-08 DIAGNOSIS — R159 Full incontinence of feces: Secondary | ICD-10-CM

## 2021-12-08 DIAGNOSIS — M48061 Spinal stenosis, lumbar region without neurogenic claudication: Secondary | ICD-10-CM

## 2021-12-08 HISTORY — DX: Spinal stenosis, thoracic region: M48.04

## 2021-12-08 HISTORY — DX: Full incontinence of feces: R15.9

## 2021-12-08 HISTORY — DX: Unspecified abnormalities of gait and mobility: R26.9

## 2021-12-08 NOTE — Progress Notes (Unsigned)
Chief Complaint  Patient presents with   New Patient (Initial Visit)    Room 15, alone NP internal referral for neuropathy w/ abnormal gait and foot drop Today c/o back and leg pain      ASSESSMENT AND PLAN  Natalie Watts is a 76 y.o. 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 spinal stenosis, multilevel lumbar degenerative disease, moderate to severe spinal stenosis at L23, L3-4, moderate at left 4 5  She also has long history of diabetes, under suboptimal control,  MRI findings especially the severe lower thoracic spinal stenosis, multilevel lumbar spinal stenosis which certainly explain her current gait abnormalities, incontinence,  She was evaluated by different neurosurgeon, apparently was given different opinion, including surgical approach, she desire tertiary center referral, we will refer to [facility] neurosurgeon  Prescription for wheelchair   DIAGNOSTIC DATA (LABS, IMAGING, TESTING) - I reviewed patient records, labs, notes, testing and imaging myself where available.   MEDICAL HISTORY:  Natalie Watts, seen in request by   McLean-Scocuzza, Nino Glow, MD [address] [place],  [place] [address], McLean-Scocuzza, Nino Glow, MD   I reviewed and summarized the referring note.  Past medical history Diabetes,-2014 Hypertension Hyperlipidemia Coronary artery disease Peripheral vascular disease history Cervical decompression surgery in 2016, for left hand numbness, left cervical radiculopathy, gait was OK, with residual left hand weakness. 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  Use cane walker, since Jan 2023.  Gait abnormality Jan 2023, more unstable,   Walk with parkier, with one year,    Jan 2023, cane, walker,   Bladeder issue, incontinence, urinary urgency for years, incontinence 2015, for years, diaper, accident x6 months, abdomen  Past Medical History:  Diagnosis Date   Arthritis    lumbar, cervical, cervical/lumbar spondylosis   Blood clotting disorder (HCC)    CAD (coronary artery disease)    Cardiac murmur    Carotid bruit    US carotid had 09/03/14 [facility]    Carpal tunnel syndrome    Cataracts, bilateral    Cervical radiculopathy    improved since surgery 2016    Chicken pox    CKD (chronic kidney disease) stage 3, GFR 30-59 ml/min ([place])    per Dr Vassie Moselle Su notes [place] noted 12/14/20   Colon polyp    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 Loann Quill [facility]    COVID 19+    mid 11/2020   Degenerative disk disease    Diabetes ([place])    DVT (deep venous thrombosis) ([place])    DVT, lower extremity ([place])    left, 05/2013 on Xarelto x 1 month etiology unkown    GERD (gastroesophageal reflux disease)    Gout    H/O myocardial perfusion scan    06/08/20 low risk   Hammer toes, bilateral    HSV-2 infection    Hyperlipidemia    Hypertension    Insomnia    Intertrigo    Lumbar radiculopathy    improved after steroid inj and PT 10/21/20 had right L4/5 L5-S1 transforaminal epidural steroid injections   Onychomycosis    OSA on CPAP    dx in 2020 after hip surgery CPAP 9-13 cm H20 supplies [employer] PSG 03/18/2019  severe OSA AHI 42.1/hr titration 03/27/2019 rec APAP 9-13 cm H20   PAD (peripheral artery disease) (HCC)    1 x right, 3 x left in 2020-2022 needs bypass on leg had CTA with run off 11/2020 in [place]   PAD (peripheral artery disease) (HCC)    with stents   Peripheral vascular disease ([place])    Right foot drop    since 2020   Subdeltoid bursitis    Tinea pedis    Vitamin D deficiency    Past Surgical History:  Procedure Laterality Date   ABDOMINAL AORTOGRAM W/LOWER EXTREMITY N/A 06/01/2021   Procedure:  ABDOMINAL AORTOGRAM W/LOWER EXTREMITY;  Surgeon: Serafina Mitchell, MD;  Location: [place] [facility];  Service: Cardiovascular;  Laterality: N/A;   BACK SURGERY     cervical laminoplasty C3-C7 Rex Dr. 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 Edwena Bunde [place] 12 mm d colon polyp, grade 1 IH, moderate diverticulosis tubular   ESOPHAGOGASTRODUODENOSCOPY  05/01/2020   erosive gastritis Dr. Tilford Pillar [place]   PERIPHERAL VASCULAR INTERVENTION  06/01/2021   Procedure: PERIPHERAL VASCULAR INTERVENTION;  Surgeon: Serafina Mitchell, MD;  Location: [place] [facility];  Service: Cardiovascular;;  RT. SFA   right total hip Right    03/05/2019 or 02/26/2020   TUBAL LIGATION     UPPER GI ENDOSCOPY     05/01/20   VASCULAR SURGERY     08/19/19   VASCULAR SURGERY     07/05/19   VASCULAR SURGERY     05/21/2019 angioplasty since 05/2019, 06/2019 and 08/2019 had 2 left leg and 1 on right leg     PHYSICAL EXAM:   Vitals:   12/08/21 1304  BP: 140/69  Pulse: 67   Not recorded     There is no height or weight on file to calculate BMI.  PHYSICAL EXAMNIATION:  Gen: NAD, conversant, well nourised, well groomed                     Cardiovascular: Regular rate rhythm, no peripheral edema, warm, nontender. Eyes: Conjunctivae clear without exudates or hemorrhage Neck: Supple, no carotid bruits. Pulmonary: Clear to auscultation bilaterally   NEUROLOGICAL EXAM:  MENTAL STATUS: Speech/cognition: Awake, alert, oriented to history taking and casual conversation CRANIAL NERVES: CN II: Visual fields are full to confrontation. Pupils are round equal and briskly reactive to light. CN III, IV, VI: extraocular movement are normal. No ptosis. CN V: Facial sensation is intact to light touch CN VII: Face is symmetric with normal eye closure  CN VIII: Hearing is normal to causal  conversation. CN IX, X: Phonation is normal. CN XI: Head turning and shoulder shrug are intact  MOTOR:   UE Shoulder Abduction Shoulder External Rotation Elbow Flexion Elbow  Extension Pronation Supination Wrist Flexion Wrist Extension Grip Finger  Abduction  R _0 L 5- 5- 5- _1 5- 4   LE Hip Flexion Knee flexion Knee extension Ankle Dorsiflexion  R _2 L _3 5-     REFLEXES: Reflexes are 1 and symmetric at the biceps, triceps, 3 knees, and absent at ankles. Plantar responses are extensor bilaterally  SENSORY: Length-dependent decreased light touch, pinprick to bilateral knee level  COORDINATION: There is no trunk  or limb dysmetria noted.  GAIT/STANCE: She needs assistance to get up from seated position, stiff, unsteady, dragging right leg  REVIEW OF SYSTEMS:  Full 14 system review of systems performed and notable only for as above All other review of systems were negative.   ALLERGIES: Allergies  Allergen Reactions   Lisinopril Anaphylaxis and Swelling     Tongue swelling    Sulfamethoxazole-Trimethoprim Other (See Comments)    Worked against diabetic medication   Farxiga [Dapagliflozin]     diarrhea   Gabapentin    Glipizide     Diarrhea    Metformin And Related     GI sx's diarrhea   Penicillins     Childhood   Tramadol Nausea Only and Nausea And Vomiting    HOME MEDICATIONS: Current Outpatient Medications  Medication Sig Dispense Refill   acetaminophen (TYLENOL) 500 MG tablet Take 1,000 mg by mouth 2 (two) times daily as needed for moderate pain or mild pain. Rapid release     amLODipine (NORVASC) 5 MG tablet TAKE 1 TABLET BY MOUTH EVERY DAY 90 tablet 1   aspirin EC 81 MG tablet Take 1 tablet (81 mg total) by mouth daily. Swallow whole. 150 tablet 2   B-D UF III MINI PEN NEEDLES 31G X 5 MM MISC Inject 1 each into the skin 2 (two) times daily. Or preferred choice of the patient 300 each 3   Blood Glucose Monitoring  Suppl (ONE TOUCH ULTRA 2) w/Device KIT Used to check blood sugars daily. 1 each 0   Cholecalciferol (VITAMIN D3) 50 MCG (2000 UT) capsule Take 2,000 Units by mouth daily.     clobetasol cream (TEMOVATE) 0.45 % Apply 1 application topically 2 (two) times daily. 60 g 2   clopidogrel (PLAVIX) 75 MG tablet TAKE 1 TABLET BY MOUTH EVERY DAY 90 tablet 1   Continuous Blood Gluc Receiver (FREESTYLE LIBRE 2 READER) DEVI 1 Device by Does not apply route every 14 (fourteen) days. 1 each 0   Continuous Blood Gluc Sensor (FREESTYLE LIBRE 2 SENSOR) MISC 1 each by Does not apply route every 14 (fourteen) days. 2 each 5   Dulaglutide (TRULICITY) 1.5 WU/9.8JX SOPN Inject 1.5 mg into the skin once a week. D/c 0.75 3 mL 2   ezetimibe (ZETIA) 10 MG tablet Take 1 tablet (10 mg total) by mouth daily. 90 tablet 3   glucose blood test strip Use 2 times daily before meals. Choice for meter 300 each 4   Lancets Ultra Fine MISC 1 Device by Does not apply route 2 (two) times daily as needed. Preferred for her device 180 each 3   LANTUS SOLOSTAR 100 UNIT/ML Solostar Pen Inject 40 Units into the skin daily. 15 mL 11   leflunomide (ARAVA) 20 MG tablet Take 20 mg by mouth daily. rheumatology     losartan (COZAAR) 50 MG tablet Take 1 tablet (50 mg total) by mouth daily. 90 tablet 3   nebivolol (BYSTOLIC) 5 MG tablet Take 1 tablet (5 mg total) by mouth daily. Dc tenoretic 90 tablet 3   potassium chloride SA (KLOR-CON M) 20 MEQ tablet Take 1 tablet (20 mEq total) by mouth every other day. 90 tablet 3   pravastatin (PRAVACHOL) 40 MG tablet Take 1 tablet (40 mg total) by mouth daily. At night 90 tablet 3   XARELTO 2.5 MG TABS tablet Take 2.5 mg by mouth 2 (two) times daily.     No current facility-administered medications for this visit.    PAST MEDICAL  HISTORY: Past Medical History:  Diagnosis Date   Arthritis    lumbar, cervical, cervical/lumbar spondylosis   Blood clotting disorder (HCC)    CAD (coronary artery disease)     Cardiac murmur    Carotid bruit    US carotid had 09/03/14 [facility]    Carpal tunnel syndrome    Cataracts, bilateral    Cervical radiculopathy    improved since surgery 2016    Chicken pox    CKD (chronic kidney disease) stage 3, GFR 30-59 ml/min (HCC)    per Dr Vassie Moselle Su notes [place] noted 12/14/20   Colon polyp    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 Loann Quill [facility]    COVID 19+    mid 11/2020   Degenerative disk disease    Diabetes ([place])    DVT (deep venous thrombosis) ([place])    DVT, lower extremity ([place])    left, 05/2013 on Xarelto x 1 month etiology unkown    GERD (gastroesophageal reflux disease)    Gout    H/O myocardial perfusion scan    06/08/20 low risk   Hammer toes, bilateral    HSV-2 infection    Hyperlipidemia    Hypertension    Insomnia    Intertrigo    Lumbar radiculopathy    improved after steroid inj and PT 10/21/20 had right L4/5 L5-S1 transforaminal epidural steroid injections   Onychomycosis    OSA on CPAP    dx in 2020 after hip surgery CPAP 9-13 cm H20 supplies [employer] PSG 03/18/2019 severe OSA AHI 42.1/hr titration 03/27/2019 rec APAP 9-13 cm H20   PAD (peripheral artery disease) ([place])    1 x right, 3 x left in 2020-2022 needs bypass on leg had CTA with run off 11/2020 [place]   PAD (peripheral artery disease) (HCC)    with stents   Peripheral vascular disease ([place])    Right foot drop    since 2020   Subdeltoid bursitis    Tinea pedis    Vitamin D deficiency     PAST SURGICAL HISTORY: Past Surgical History:  Procedure Laterality Date   ABDOMINAL AORTOGRAM W/LOWER EXTREMITY N/A 06/01/2021   Procedure: ABDOMINAL AORTOGRAM W/LOWER EXTREMITY;  Surgeon: Serafina Mitchell, MD;  Location: [place] [facility];  Service: Cardiovascular;  Laterality: N/A;   BACK SURGERY     cervical laminoplasty C3-C7 Rex Dr. Sheppard Evens fusion/decompression 2/2 myelopathy 03/2014 or 02/2015   COLON SURGERY     colectomy in 2015    COLONOSCOPY  05/01/2020   [place] Dr. Edwena Bunde [place] 12 mm d colon polyp, grade 1 IH, moderate diverticulosis tubular   ESOPHAGOGASTRODUODENOSCOPY  05/01/2020   erosive gastritis Dr. Tilford Pillar [place]   PERIPHERAL VASCULAR INTERVENTION  06/01/2021   Procedure: PERIPHERAL VASCULAR INTERVENTION;  Surgeon: Serafina Mitchell, MD;  Location: [place] [facility];  Service: Cardiovascular;;  RT. SFA   right total hip Right    03/05/2019 or 02/26/2020   TUBAL LIGATION     UPPER GI ENDOSCOPY     05/01/20   VASCULAR SURGERY     08/19/19   VASCULAR SURGERY     07/05/19   VASCULAR SURGERY     05/21/2019 angioplasty since 05/2019, 06/2019 and 08/2019 had 2 left leg and 1 on right leg    FAMILY HISTORY: Family History  Problem Relation Age of Onset   Hypertension Mother    Arthritis Mother    Cancer  Father        colon cancer dx'ed died age 21   Hypertension Sister    Arthritis Sister    Cancer Sister        died in 70s brain tumor h/o lung cancer smoker died Aug 11, 2015    Early death Sister    Breast cancer Maternal Grandmother    Cancer Maternal Grandmother        breast dx older age    Hypertension Maternal Grandmother    Breast cancer Other        maternal neice   Cancer Other        died in 23s    Hypertension Daughter    Hypertension Daughter     SOCIAL HISTORY: Social History   Socioeconomic History   Marital status: Divorced    Spouse name: Not on file   Number of children: Not on file   Years of education: Not on file   Highest education level: Not on file  Occupational History   Not on file  Tobacco Use   Smoking status: Former    Packs/day: 1.00    Years: 45.00    Total pack years: 45.00    Types: Cigarettes    Quit date: 12/18/2012    Years since quitting: 8.9   Smokeless tobacco: Never  Vaping Use   Vaping Use: Never used  Substance and Sexual Activity   Alcohol use: No   Drug use: Yes    Frequency: 3.0 times per week   Sexual  activity: Not on file  Other Topics Concern   Not on file  Social History Narrative   Divorced    2 daughters [name] comes to most appts and is Chief Executive Officer    -lives with daughter [name]    Used to work for [employer] now [employer]    Former smoker age 71 to age 76/2012 quit for sure in Aug 10, 2012 max 1/2 ppd FH lung cancer sister also smoker    Social Determinants of Health   Financial Resource Strain: Low Risk  (01/02/2018)   Overall Financial Resource Strain (CARDIA)    Difficulty of Paying Living Expenses: Not hard at all  Food Insecurity: No Food Insecurity (09/22/2021)   Hunger Vital Sign    Worried About Running Out of Food in the Last Year: Never true    [place] in the Last Year: Never true  Transportation Needs: No Transportation Needs (09/22/2021)   PRAPARE - Hydrologist (Medical): No    Lack of Transportation (Non-Medical): No  Physical Activity: Insufficiently Active (09/22/2021)   Exercise Vital Sign    Days of Exercise per Week: 1 day    Minutes of Exercise per Session: 60 min  Stress: No Stress Concern Present (09/22/2021)   [place]    Feeling of Stress : Not at all  Social Connections: Unknown (09/22/2021)   Social Connection and Isolation Panel [NHANES]    Frequency of Communication with Friends and Family: More than three times a week    Frequency of Social Gatherings with Friends and Family: More than three times a week    Attends Religious Services: More than 4 times per year    Active Member of [employer] or Organizations: Not on file    Attends Archivist Meetings: More than 4 times per year    Marital Status: Not on file  Intimate Partner Violence: Not At Risk (09/22/2021)  Humiliation, Afraid, Rape, and Kick questionnaire    Fear of Current or Ex-Partner: No    Emotionally Abused: No    Physically Abused: No    Sexually Abused: No      Marcial Pacas,  M.D. Ph.D.  [facility] [address], [place], [place] [address] Ph: [phone] Fax: [phone]  CC:  McLean-Scocuzza, Nino Glow, MD [address] [place],  [place] [address]  McLean-Scocuzza, Nino Glow, MD

## 2021-12-10 ENCOUNTER — Ambulatory Visit: Payer: Medicare PPO | Admitting: Podiatry

## 2021-12-10 DIAGNOSIS — M79675 Pain in left toe(s): Secondary | ICD-10-CM | POA: Diagnosis not present

## 2021-12-10 DIAGNOSIS — B351 Tinea unguium: Secondary | ICD-10-CM

## 2021-12-10 DIAGNOSIS — M79674 Pain in right toe(s): Secondary | ICD-10-CM | POA: Diagnosis not present

## 2021-12-12 ENCOUNTER — Other Ambulatory Visit: Payer: Self-pay | Admitting: Internal Medicine

## 2021-12-13 ENCOUNTER — Other Ambulatory Visit: Payer: Self-pay | Admitting: Internal Medicine

## 2021-12-13 ENCOUNTER — Other Ambulatory Visit: Payer: Self-pay

## 2021-12-13 ENCOUNTER — Telehealth: Payer: Self-pay | Admitting: Neurology

## 2021-12-13 DIAGNOSIS — E785 Hyperlipidemia, unspecified: Secondary | ICD-10-CM

## 2021-12-14 ENCOUNTER — Encounter: Payer: Self-pay | Admitting: Internal Medicine

## 2021-12-16 DIAGNOSIS — N2581 Secondary hyperparathyroidism of renal origin: Secondary | ICD-10-CM | POA: Diagnosis not present

## 2021-12-16 DIAGNOSIS — R809 Proteinuria, unspecified: Secondary | ICD-10-CM | POA: Diagnosis not present

## 2021-12-16 DIAGNOSIS — D631 Anemia in chronic kidney disease: Secondary | ICD-10-CM | POA: Diagnosis not present

## 2021-12-16 DIAGNOSIS — I129 Hypertensive chronic kidney disease with stage 1 through stage 4 chronic kidney disease, or unspecified chronic kidney disease: Secondary | ICD-10-CM | POA: Diagnosis not present

## 2021-12-16 DIAGNOSIS — N1832 Chronic kidney disease, stage 3b: Secondary | ICD-10-CM | POA: Diagnosis not present

## 2021-12-22 ENCOUNTER — Encounter: Payer: Self-pay | Admitting: Internal Medicine

## 2021-12-22 ENCOUNTER — Ambulatory Visit: Payer: Medicare PPO | Admitting: Internal Medicine

## 2021-12-22 ENCOUNTER — Ambulatory Visit: Payer: Medicare PPO | Admitting: Neurology

## 2021-12-22 VITALS — BP 130/60 | HR 80 | Temp 98.3°F | Resp 14 | Ht 61.0 in | Wt 167.4 lb

## 2021-12-22 DIAGNOSIS — M25552 Pain in left hip: Secondary | ICD-10-CM | POA: Insufficient documentation

## 2021-12-22 DIAGNOSIS — M47812 Spondylosis without myelopathy or radiculopathy, cervical region: Secondary | ICD-10-CM | POA: Diagnosis not present

## 2021-12-22 DIAGNOSIS — E1159 Type 2 diabetes mellitus with other circulatory complications: Secondary | ICD-10-CM

## 2021-12-22 DIAGNOSIS — F4321 Adjustment disorder with depressed mood: Secondary | ICD-10-CM

## 2021-12-22 DIAGNOSIS — Z789 Other specified health status: Secondary | ICD-10-CM

## 2021-12-22 DIAGNOSIS — M1712 Unilateral primary osteoarthritis, left knee: Secondary | ICD-10-CM

## 2021-12-22 DIAGNOSIS — M48061 Spinal stenosis, lumbar region without neurogenic claudication: Secondary | ICD-10-CM

## 2021-12-22 DIAGNOSIS — R269 Unspecified abnormalities of gait and mobility: Secondary | ICD-10-CM

## 2021-12-22 DIAGNOSIS — G8929 Other chronic pain: Secondary | ICD-10-CM | POA: Diagnosis not present

## 2021-12-22 DIAGNOSIS — Z7409 Other reduced mobility: Secondary | ICD-10-CM

## 2021-12-22 DIAGNOSIS — M5416 Radiculopathy, lumbar region: Secondary | ICD-10-CM

## 2021-12-22 DIAGNOSIS — M25562 Pain in left knee: Secondary | ICD-10-CM

## 2021-12-22 DIAGNOSIS — R937 Abnormal findings on diagnostic imaging of other parts of musculoskeletal system: Secondary | ICD-10-CM | POA: Diagnosis not present

## 2021-12-22 DIAGNOSIS — M4804 Spinal stenosis, thoracic region: Secondary | ICD-10-CM | POA: Diagnosis not present

## 2021-12-22 DIAGNOSIS — M48062 Spinal stenosis, lumbar region with neurogenic claudication: Secondary | ICD-10-CM | POA: Diagnosis not present

## 2021-12-22 DIAGNOSIS — G629 Polyneuropathy, unspecified: Secondary | ICD-10-CM

## 2021-12-22 DIAGNOSIS — E2839 Other primary ovarian failure: Secondary | ICD-10-CM

## 2021-12-22 DIAGNOSIS — M25551 Pain in right hip: Secondary | ICD-10-CM

## 2021-12-22 DIAGNOSIS — I152 Hypertension secondary to endocrine disorders: Secondary | ICD-10-CM

## 2021-12-22 DIAGNOSIS — Z1231 Encounter for screening mammogram for malignant neoplasm of breast: Secondary | ICD-10-CM

## 2021-12-22 HISTORY — DX: Pain in left hip: M25.552

## 2021-12-22 HISTORY — DX: Pain in right hip: M25.551

## 2021-12-22 HISTORY — DX: Other reduced mobility: Z74.09

## 2021-12-22 HISTORY — DX: Other specified health status: Z78.9

## 2021-12-22 HISTORY — DX: Unilateral primary osteoarthritis, left knee: M17.12

## 2021-12-22 MED ORDER — OXYCODONE-ACETAMINOPHEN 5-325 MG PO TABS: 1.0000 | ORAL_TABLET | Freq: Two times a day (BID) | ORAL | 0 refills | Status: DC | PRN

## 2021-12-22 MED ORDER — DULOXETINE HCL 30 MG PO CPEP: 30.0000 mg | ORAL_CAPSULE | Freq: Every day | ORAL | 3 refills | Status: DC

## 2021-12-22 MED ORDER — LOSARTAN POTASSIUM 100 MG PO TABS: 100.0000 mg | ORAL_TABLET | Freq: Every day | ORAL | Status: DC

## 2021-12-22 NOTE — Progress Notes (Signed)
Chief Complaint  Patient presents with   Follow-up    Disc need for medical devices at home, pain meds & need of CNA/Nurse at home.   F/u with rolling walker today for chronic pain  1. 9/10 neck pain low back pain and left >right knee pain tried otc tylenol max dose w/o relief having trouble doing adls and iadls, dressing, walking, making meals and daughter works. It takes her 1 hour  to shoulder  NS Dr. Cari Caraway rec decompression surgery for low back and Dr. Aris Lot also saw at [facility] and another NS thinks too many chronic medical issues for surgery  She is requesting rollator with seat and brakes, hospital bed, PCS at home, RN and RN aid at home and referral to pain clinic and stronger pain meds  [facility] gave Rx for wheelchair  2. PAD pain in left lower extremity worse wants f/u sooner than 01/31/22      Review of Systems  Constitutional:  Negative for weight loss.  HENT:  Negative for hearing loss.   Eyes:  Negative for blurred vision.  Respiratory:  Negative for shortness of breath.   Cardiovascular:  Negative for chest pain.  Gastrointestinal:  Negative for abdominal pain and blood in stool.  Genitourinary:  Negative for dysuria.  Musculoskeletal:  Positive for back pain, joint pain and neck pain. Negative for falls.       +near falls  Skin:  Negative for rash.  Neurological:  Negative for headaches.  Psychiatric/Behavioral:  Negative for depression.    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 Loann Quill [facility]    COVID 19+    mid 11/2020   Degenerative disk disease    Diabetes ([place])    DVT (deep venous thrombosis) ([place])    DVT, lower extremity ([place])    left, 05/2013 on Xarelto x 1 month etiology unkown    GERD (gastroesophageal reflux disease)    Gout    H/O myocardial perfusion scan    06/08/20 low risk   Hammer toes, bilateral    HSV-2 infection    Hyperlipidemia    Hypertension    Insomnia    Intertrigo    Lumbar radiculopathy    improved after steroid inj and PT 10/21/20 had right L4/5 L5-S1 transforaminal epidural steroid injections   Onychomycosis    OSA on CPAP    dx in 2020 after hip surgery CPAP 9-13 cm H20 supplies [employer] PSG 03/18/2019 severe OSA AHI 42.1/hr titration 03/27/2019 rec APAP 9-13 cm H20   PAD (peripheral artery disease) ([place])    1 x right, 3 x left in 2020-2022 needs bypass on leg had CTA with run off 11/2020 in [place]   PAD (peripheral artery disease) ([place])    with stents   Peripheral vascular disease ([place])    Right foot drop    since 2020  Subdeltoid bursitis    Tinea pedis    Vitamin D deficiency    Past Surgical History:  Procedure Laterality Date   ABDOMINAL AORTOGRAM W/LOWER EXTREMITY N/A 06/01/2021   Procedure: ABDOMINAL AORTOGRAM W/LOWER EXTREMITY;  Surgeon: Serafina Mitchell, MD;  Location: [place] [facility];  Service: Cardiovascular;  Laterality: N/A;   BACK SURGERY     cervical laminoplasty C3-C7 Rex Dr. Sheppard Evens fusion/decompression 2/2 myelopathy 03/2014 or 02/2015   COLON SURGERY     colectomy in 19-Sep-2013   COLONOSCOPY  05/01/2020   [place] [facility] Dr. Edwena Bunde [place] 12 mm d colon polyp, grade 1 IH, moderate diverticulosis tubular   ESOPHAGOGASTRODUODENOSCOPY  05/01/2020   erosive gastritis Dr. Tilford Pillar [place]   PERIPHERAL VASCULAR INTERVENTION  06/01/2021   Procedure: PERIPHERAL VASCULAR INTERVENTION;  Surgeon: Serafina Mitchell, MD;  Location: [place] [facility];   Service: Cardiovascular;;  RT. SFA   right total hip Right    03/05/2019 or 02/26/2020   TUBAL LIGATION     UPPER GI ENDOSCOPY     05/01/20   VASCULAR SURGERY     08/19/19   VASCULAR SURGERY     07/05/19   VASCULAR SURGERY     05/21/2019 angioplasty since 05/2019, 06/2019 and 2019-09-20 had 2 left leg and 1 on right leg   Family History  Problem Relation Age of Onset   Hypertension Mother    Arthritis Mother    Cancer Father        colon cancer dx'ed died age 53   Hypertension Sister    Arthritis Sister    Cancer Sister        died in 31s brain tumor h/o lung cancer smoker died 2015/09/20    Early death Sister    Breast cancer Maternal Grandmother    Cancer Maternal Grandmother        breast dx older age    Hypertension Maternal Grandmother    Breast cancer Other        maternal neice   Cancer Other        died in 92s    Hypertension Daughter    Hypertension Daughter    Social History   Socioeconomic History   Marital status: Divorced    Spouse name: Not on file   Number of children: Not on file   Years of education: Not on file   Highest education level: Not on file  Occupational History   Not on file  Tobacco Use   Smoking status: Former    Packs/day: 1.00    Years: 45.00    Total pack years: 45.00    Types: Cigarettes    Quit date: 12/18/2012    Years since quitting: 9.0   Smokeless tobacco: Never  Vaping Use   Vaping Use: Never used  Substance and Sexual Activity   Alcohol use: No   Drug use: Yes    Frequency: 3.0 times per week   Sexual activity: Not on file  Other Topics Concern   Not on file  Social History Narrative   Divorced    2 daughters [name] comes to most appts and is Chief Executive Officer    -lives with daughter [name]    Used to work for [employer] now [employer]    Former smoker age 35 to age 76/2012 quit for sure in 09-19-2012 max 1/2 ppd FH lung cancer sister also smoker    Social Determinants of Radio broadcast assistant Strain: Low  Risk  (01/02/2018)    Overall Financial Resource Strain (CARDIA)    Difficulty of Paying Living Expenses: Not hard at all  Food Insecurity: No Food Insecurity (09/22/2021)   Hunger Vital Sign    Worried About Running Out of Food in the Last Year: Never true    Ran Out of Food in the Last Year: Never true  Transportation Needs: No Transportation Needs (09/22/2021)   PRAPARE - Hydrologist (Medical): No    Lack of Transportation (Non-Medical): No  Physical Activity: Insufficiently Active (09/22/2021)   Exercise Vital Sign    Days of Exercise per Week: 1 day    Minutes of Exercise per Session: 60 min  Stress: No Stress Concern Present (09/22/2021)   [place]    Feeling of Stress : Not at all  Social Connections: Unknown (09/22/2021)   Social Connection and Isolation Panel [NHANES]    Frequency of Communication with Friends and Family: More than three times a week    Frequency of Social Gatherings with Friends and Family: More than three times a week    Attends Religious Services: More than 4 times per year    Active Member of [employer] or Organizations: Not on file    Attends Archivist Meetings: More than 4 times per year    Marital Status: Not on file  Intimate Partner Violence: Not At Risk (09/22/2021)   Humiliation, Afraid, Rape, and Kick questionnaire    Fear of Current or Ex-Partner: No    Emotionally Abused: No    Physically Abused: No    Sexually Abused: No   Current Meds  Medication Sig   acetaminophen (TYLENOL) 500 MG tablet Take 1,000 mg by mouth 2 (two) times daily as needed for moderate pain or mild pain. Rapid release   amLODipine (NORVASC) 5 MG tablet TAKE 1 TABLET BY MOUTH EVERY DAY   aspirin EC 81 MG tablet Take 1 tablet (81 mg total) by mouth daily. Swallow whole.   B-D UF III MINI PEN NEEDLES 31G X 5 MM MISC Inject 1 each into the skin 2 (two) times daily. Or preferred choice of the patient    Blood Glucose Monitoring Suppl (ONE TOUCH ULTRA 2) w/Device KIT Used to check blood sugars daily.   Cholecalciferol (VITAMIN D3) 50 MCG (2000 UT) capsule Take 2,000 Units by mouth daily.   clopidogrel (PLAVIX) 75 MG tablet TAKE 1 TABLET BY MOUTH EVERY DAY   Continuous Blood Gluc Receiver (FREESTYLE LIBRE 2 READER) DEVI 1 Device by Does not apply route every 14 (fourteen) days.   Dulaglutide (TRULICITY) 1.5 OT/1.5BW SOPN Inject 1.5 mg into the skin once a week. D/c 0.75   DULoxetine (CYMBALTA) 30 MG capsule Take 1 capsule (30 mg total) by mouth daily.   ezetimibe (ZETIA) 10 MG tablet Take 1 tablet (10 mg total) by mouth daily.   Lancets Ultra Fine MISC 1 Device by Does not apply route 2 (two) times daily as needed. Preferred for her device   LANTUS SOLOSTAR 100 UNIT/ML Solostar Pen Inject 40 Units into the skin daily.   leflunomide (ARAVA) 20 MG tablet Take 20 mg by mouth daily. rheumatology   nebivolol (BYSTOLIC) 5 MG tablet Take 1 tablet (5 mg total) by mouth daily. Dc tenoretic   oxyCODONE-acetaminophen (PERCOCET/ROXICET) 5-325 MG tablet Take 1 tablet by mouth 2 (two) times daily as needed for up to 7 days for severe pain.   potassium chloride  SA (KLOR-CON M) 20 MEQ tablet Take 1 tablet (20 mEq total) by mouth every other day.   pravastatin (PRAVACHOL) 40 MG tablet TAKE 1 TABLET BY MOUTH EVERY DAY   XARELTO 2.5 MG TABS tablet Take 2.5 mg by mouth 2 (two) times daily.   [DISCONTINUED] losartan (COZAAR) 50 MG tablet Take 1 tablet (50 mg total) by mouth daily.   Allergies  Allergen Reactions   Lisinopril Anaphylaxis and Swelling     Tongue swelling    Sulfamethoxazole-Trimethoprim Other (See Comments)    Worked against diabetic medication   Farxiga [Dapagliflozin]     diarrhea   Gabapentin    Glipizide     Diarrhea    Metformin And Related     GI sx's diarrhea   Penicillins     Childhood   Tramadol Nausea Only and Nausea And Vomiting   Recent Results (from the past 2160 hour(s))   Lipid panel     Status: None   Collection Time: 09/30/21  7:53 AM  Result Value Ref Range   Cholesterol 166 0 - 200 mg/dL    Comment: ATP III Classification       Desirable:  < 200 mg/dL               Borderline High:  200 - 239 mg/dL          High:  > = 240 mg/dL   Triglycerides 82.0 0.0 - 149.0 mg/dL    Comment: Normal:  <150 mg/dLBorderline High:  150 - 199 mg/dL   HDL 72.50 >39.00 mg/dL   VLDL 16.4 0.0 - 40.0 mg/dL   LDL Cholesterol 77 0 - 99 mg/dL   Total CHOL/HDL Ratio 2     Comment:                Men          Women1/2 Average Risk     3.4          3.3Average Risk          5.0          4.42X Average Risk          9.6          7.13X Average Risk          15.0          11.0                       NonHDL 93.64     Comment: NOTE:  Non-HDL goal should be 30 mg/dL higher than patient's LDL goal (i.e. LDL goal of < 70 mg/dL, would have non-HDL goal of < 100 mg/dL)  Hemoglobin A1c     Status: Abnormal   Collection Time: 09/30/21  7:53 AM  Result Value Ref Range   Hgb A1c MFr Bld 7.7 (H) 4.6 - 6.5 %    Comment: Glycemic Control Guidelines for People with Diabetes:Non Diabetic:  <6%Goal of Therapy: <7%Additional Action Suggested:  >8%   Comprehensive metabolic panel     Status: Abnormal   Collection Time: 09/30/21  7:53 AM  Result Value Ref Range   Sodium 140 135 - 145 mEq/L   Potassium 3.2 (L) 3.5 - 5.1 mEq/L   Chloride 104 96 - 112 mEq/L   CO2 28 19 - 32 mEq/L   Glucose, Bld 77 70 - 99 mg/dL   BUN 21 6 - 23 mg/dL   Creatinine, Ser 1.05 0.40 - 1.20 mg/dL  Total Bilirubin 0.5 0.2 - 1.2 mg/dL   Alkaline Phosphatase 85 39 - 117 U/L   AST 21 0 - 37 U/L   ALT 18 0 - 35 U/L   Total Protein 7.4 6.0 - 8.3 g/dL   Albumin 4.0 3.5 - 5.2 g/dL   GFR 51.75 (L) >60.00 mL/min    Comment: Calculated using the CKD-EPI Creatinine Equation (2021)   Calcium 9.2 8.4 - 10.5 mg/dL  CBC with Differential/Platelet     Status: Abnormal   Collection Time: 09/30/21  7:53 AM  Result Value Ref Range   WBC  7.1 4.0 - 10.5 K/uL   RBC 4.63 3.87 - 5.11 Mil/uL   Hemoglobin 12.6 12.0 - 15.0 g/dL   HCT 39.4 36.0 - 46.0 %   MCV 85.1 78.0 - 100.0 fl   MCHC 31.9 30.0 - 36.0 g/dL   RDW 17.0 (H) 11.5 - 15.5 %   Platelets 156.0 150.0 - 400.0 K/uL   Neutrophils Relative % 49.9 43.0 - 77.0 %   Lymphocytes Relative 40.3 12.0 - 46.0 %   Monocytes Relative 7.1 3.0 - 12.0 %   Eosinophils Relative 2.4 0.0 - 5.0 %   Basophils Relative 0.3 0.0 - 3.0 %   Neutro Abs 3.5 1.4 - 7.7 K/uL   Lymphs Abs 2.8 0.7 - 4.0 K/uL   Monocytes Absolute 0.5 0.1 - 1.0 K/uL   Eosinophils Absolute 0.2 0.0 - 0.7 K/uL   Basophils Absolute 0.0 0.0 - 0.1 K/uL  Basic Metabolic Panel (BMET)     Status: Abnormal   Collection Time: 11/02/21  2:26 PM  Result Value Ref Range   Glucose 107 (H) 70 - 99 mg/dL   BUN 17 8 - 27 mg/dL   Creatinine, Ser 1.10 (H) 0.57 - 1.00 mg/dL   eGFR 52 (L) >59 mL/min/1.73   BUN/Creatinine Ratio 15 12 - 28   Sodium 145 (H) 134 - 144 mmol/L   Potassium 4.8 3.5 - 5.2 mmol/L   Chloride 108 (H) 96 - 106 mmol/L   CO2 22 20 - 29 mmol/L   Calcium 9.8 8.7 - 10.3 mg/dL   Objective  Body mass index is 31.63 kg/m. Wt Readings from Last 3 Encounters:  12/22/21 167 lb 6.4 oz (75.9 kg)  11/02/21 163 lb 12.8 oz (74.3 kg)  10/13/21 167 lb (75.8 kg)   Temp Readings from Last 3 Encounters:  12/22/21 98.3 F (36.8 C) (Oral)  10/13/21 98.3 F (36.8 C) (Oral)  08/20/21 98.5 F (36.9 C) (Oral)   BP Readings from Last 3 Encounters:  12/22/21 130/60  12/08/21 140/69  11/02/21 134/80   Pulse Readings from Last 3 Encounters:  12/22/21 80  12/08/21 67  11/02/21 75    Physical Exam Vitals and nursing note reviewed.  Constitutional:      Appearance: Normal appearance. She is well-developed and well-groomed.  HENT:     Head: Normocephalic and atraumatic.  Eyes:     Conjunctiva/sclera: Conjunctivae normal.     Pupils: Pupils are equal, round, and reactive to light.  Cardiovascular:     Rate and Rhythm:  Normal rate and regular rhythm.     Heart sounds: Normal heart sounds. No murmur heard. Pulmonary:     Effort: Pulmonary effort is normal.     Breath sounds: Normal breath sounds.  Abdominal:     General: Abdomen is flat. Bowel sounds are normal.     Tenderness: There is no abdominal tenderness.  Musculoskeletal:  General: No tenderness.  Skin:    General: Skin is warm and dry.  Neurological:     General: No focal deficit present.     Mental Status: She is alert and oriented to person, place, and time. Mental status is at baseline.     Cranial Nerves: Cranial nerves 2-12 are intact.     Motor: Weakness present.     Gait: Gait abnormal.     Comments: Rollator today  +right foot drop 3-4/5 strength b/l lower legs   Psychiatric:        Attention and Perception: Attention and perception normal.        Mood and Affect: Mood and affect normal.        Speech: Speech normal.        Behavior: Behavior normal. Behavior is cooperative.        Thought Content: Thought content normal.        Cognition and Memory: Cognition and memory normal.        Judgment: Judgment normal.     Assessment  Plan  Cervical spondylosis without myelopathy - Plan: oxyCODONE-acetaminophen (PERCOCET/ROXICET) 5-325 MG tablet bid prn, Ambulatory referral to Pain Clinic Dr. Humphrey Rolls For home use only DME Other see comment rollator with seat and brakes, Ambulatory referral to [place], For home use only DME Other see comment hospital bed   Lumbar radiculopathy - Plan: oxyCODONE-acetaminophen (PERCOCET/ROXICET) 5-325 MG tablet, Ambulatory referral to Pain Clinic, For home use only DME Other see comment, Ambulatory referral to [place], For home use only DME Other see comment See above  Spinal stenosis of lumbar region with neurogenic claudication - Plan: oxyCODONE-acetaminophen (PERCOCET/ROXICET) 5-325 MG tablet, Ambulatory referral to Pain Clinic, For home use only DME Other see comment, Ambulatory referral  to [place], For home use only DME Other see comment See above  Abnormal MRI, lumbar spine - Plan: oxyCODONE-acetaminophen (PERCOCET/ROXICET) 5-325 MG tablet, Ambulatory referral to Pain Clinic, For home use only DME Other see comment, Ambulatory referral to [place], For home use only DME Other see comment See above  Spinal stenosis of lumbar region, unspecified whether neurogenic claudication present - Plan: oxyCODONE-acetaminophen (PERCOCET/ROXICET) 5-325 MG tablet, Ambulatory referral to Pain Clinic, For home use only DME Other see comment, Ambulatory referral to [place], For home use only DME Other see comment See above  Thoracic spinal stenosis - Plan: oxyCODONE-acetaminophen (PERCOCET/ROXICET) 5-325 MG tablet, Ambulatory referral to Pain Clinic, For home use only DME Other see comment, Ambulatory referral to [place], For home use only DME Other see comment See above  Gait abnormality - Plan: oxyCODONE-acetaminophen (PERCOCET/ROXICET) 5-325 MG tablet, For home use only DME Other see comment, Ambulatory referral to [place], For home use only DME Other see comment See above  Other chronic pain - Plan: oxyCODONE-acetaminophen (PERCOCET/ROXICET) 5-325 MG tablet, Ambulatory referral to Pain Clinic, For home use only DME Other see comment, Ambulatory referral to [place], For home use only DME Other see comment, DULoxetine (CYMBALTA) 30 MG capsule See above  Impaired mobility and ADLs - Plan: For home use only DME Other see comment, Ambulatory referral to [place], For home use only DME Other see comment Impaired instrumental activities of daily living (IADL) - Plan: For home use only DME Other see comment, Ambulatory referral to [place], For home use only DME Other see comment PCS + h/h RN and aid  Rx hospital bed with gel/foam overlay  Patient request re-positioning of the body in ways that  can not be achieved with an ordinary bed or wedge pillow, eliminate pain,  reduce pressure, treat existence of pressure sores, etc Patient limited due to chronic pain/physical deconditioning/weakness and unable to change positions on her own Patient requires traction equipment which can only be attached to a hospital bed Meets 1 of the hospital bed criteria above  AND clinical  notes show patient she meets the need for for hospital bed   Gel overlay She has inability to move in order to change body positions Limited mobility  with rollator  Patient can not use a cane alone. Also the Patient can safely propel herself or has a family member/caregiver that can push her.    For the hospital bed Patient needs repositioning that can not be done in a regular bed or alone.   Hypertension associated with diabetes ([place]) - Plan: losartan (COZAAR) 100 MG tablet per renal  Continue other meds   Bilateral hip pain - Plan: Ambulatory referral to [place], Ambulatory referral to [facility] For home use only DME Other see comment  Chronic pain of left knee - Plan: Ambulatory referral to [place], Ambulatory referral to Orthopedic Surgery, For home use only DME Other see comment  Arthritis of left knee - Plan: Ambulatory referral to [place], Ambulatory referral to Orthopedic Surgery, For home use only DME Other see comment  Neuropathy - Plan: DULoxetine (CYMBALTA) 30 MG capsule  Adjustment disorder with depressed mood  Pt will look into support groups   HM Flu shot 04/09/21 Given hep B 3/3 shots  pna 23 vx had 11/27/08, 02/26/20 given Dr. Darcel Smalling office  prevnar had 06/20/17 per Dr. Kasandra Knudsen note had in [place] but she had in our office 10/17/17 Will disc shingrix in the future  Td had 02/14/11 and 02/17/18 ?Td vs tdap Covid 08/29/19, 09/26/19, 04/29/20, 12/02/20 had covid 11/2020 +, consider 5th dose    Shingrix ? If had was noted in Dr. Darcel Smalling note as recorded 06/20/17 ? If had    appt GI Dr. Darien Ramus [phone] colonoscopy 01/2018  Colonoscopy EGD see report from  [place] 05/01/20    CT chest with chronic smoking hx quit 2012 or 2014 former smoker 20-30 years 1 pk lasts 2 days  Outside CT chest in [place] w/o contrast 03/13/20 lung small nodules Due for repeat CT chest 02/2021    [place] scan 03/13/20 no AAA aortic atherosclerosis   Diabetic eye exam 01/2020 normal per Dr. Kasandra Knudsen  DM foot exam 03/24/20 per Dr. Kasandra Knudsen Urine protein due 06/05/21 per Dr. Kasandra Knudsen    Dr. Kasandra Knudsen echo 04/2020 EF 65-70% mild concentric LVH, mild diastolic dysfunction low risk myocardial perfusion scan 05/2020    Out of age window pap    mammo  07/22/21 normal ordered 2024   DEXA 10/30/13 osteopenia will rec vit D3 1000-2000 IU qd and calcium 600 mg bid  -consider repeat dexa if not had ordered with next mammogram   OSA on cpap since 2020 apap 9-13 cmH20   Provider: Dr. Olivia Mackie McLean-Scocuzza-Internal Medicine

## 2021-12-22 NOTE — Patient Instructions (Addendum)
Touched by angel for personal care services   Referred Dr. Roland Rack hip and knee pain  [facility]   [place], [place] [address]   [phone]   Poggi, Smith Mince, MD   [place]   [facility] [place],  [address]   [phone]   [phone] (Fax)      Dr. Nathanial Ranc[facility] Pain management physician in [place], [place] Address: [address], [place], [place] [id number] Phone: [phone]  [place]. Call here for support group info if they know  [address]  [phone] Open ? Closes 6?PM Medicaid accepted  [facility] as given the info before for both  [facility] counseling and psychiatry [place]  [place] [address] [phone]    [facility] [place]  [address]  [place] [address]  [phone]    Duloxetine Delayed-Release Capsules What is this medication? DULOXETINE (doo LOX e teen) treats depression, anxiety, fibromyalgia, and certain types of chronic pain such as nerve, bone, or joint pain. It increases the amount of serotonin and norepinephrine in the brain, hormones that help regulate mood and pain. It belongs to a group of medications called SNRIs. This medicine may be used for other purposes; ask your health care provider or pharmacist if you have questions. COMMON BRAND NAME(S): Cymbalta, Drizalma, Irenka What should I tell my care team before I take this medication? They need to know if you have any of these conditions: Bipolar disorder Glaucoma High blood pressure Kidney disease Liver disease Seizures Suicidal thoughts, plans or attempt; a previous suicide attempt by you or a family member Take medications that treat or prevent blood clots Taken medications called MAOIs like Carbex, Eldepryl, Marplan, Nardil, and Parnate within 14 days Trouble passing urine An unusual  reaction to duloxetine, other medications, foods, dyes, or preservatives Pregnant or trying to get pregnant Breast-feeding How should I use this medication? Take this medication by mouth with a glass of water. Follow the directions on the prescription label. Do not crush, cut or chew some capsules of this medication. Some capsules may be opened and sprinkled on applesauce. Check with your care team or pharmacist if you are not sure. You can take this medication with or without food. Take your medication at regular intervals. Do not take your medication more often than directed. Do not stop taking this medication suddenly except upon the advice of your care team. Stopping this medication too quickly may cause serious side effects or your condition may worsen. A special MedGuide will be given to you by the pharmacist with each prescription and refill. Be sure to read this information carefully each time. Talk to your care team regarding the use of this medication in children. While this medication may be prescribed for children as young as 66 years of age for selected conditions, precautions do apply. Overdosage: If you think you have taken too much of this medicine contact a poison control center or emergency room at once. NOTE: This medicine is only for you. Do not share this medicine with others. What if I miss a dose? If you miss a dose, take it as soon as you can. If it is almost time for your next dose, take only that dose. Do not take double or extra doses. What may interact with this medication? Do not take this medication with any of the  following: Desvenlafaxine Levomilnacipran Linezolid MAOIs like Carbex, Eldepryl, Emsam, Marplan, Nardil, and Parnate Methylene blue (injected into a vein) Milnacipran Safinamide Thioridazine Venlafaxine Viloxazine This medication may also interact with the following: Alcohol Amphetamines Aspirin and aspirin-like medications Certain antibiotics like  ciprofloxacin and enoxacin Certain medications for blood pressure, heart disease, irregular heart beat Certain medications for depression, anxiety, or psychotic disturbances Certain medications for migraine headache like almotriptan, eletriptan, frovatriptan, naratriptan, rizatriptan, sumatriptan, zolmitriptan Certain medications that treat or prevent blood clots like warfarin, enoxaparin, and dalteparin Cimetidine Fentanyl Lithium NSAIDS, medications for pain and inflammation, like ibuprofen or naproxen Phentermine Procarbazine Rasagiline Sibutramine St. John's wort Theophylline Tramadol Tryptophan This list may not describe all possible interactions. Give your health care provider a list of all the medicines, herbs, non-prescription drugs, or dietary supplements you use. Also tell them if you smoke, drink alcohol, or use illegal drugs. Some items may interact with your medicine. What should I watch for while using this medication? Tell your care team if your symptoms do not get better or if they get worse. Visit your care team for regular checks on your progress. Because it may take several weeks to see the full effects of this medication, it is important to continue your treatment as prescribed by your care team. This medication may cause serious skin reactions. They can happen weeks to months after starting the medication. Contact your care team right away if you notice fevers or flu-like symptoms with a rash. The rash may be red or purple and then turn into blisters or peeling of the skin. Or, you might notice a red rash with swelling of the face, lips, or lymph nodes in your neck or under your arms. Watch for new or worsening thoughts of suicide or depression. This includes sudden changes in mood, behaviors, or thoughts. These changes can happen at any time but are more common in the beginning of treatment or after a change in dose. Call your care team right away if you experience these  thoughts or worsening depression. Manic episodes may happen in patients with bipolar disorder who take this medication. Watch for changes in feelings or behaviors such as feeling anxious, nervous, agitated, panicky, irritable, hostile, aggressive, impulsive, severely restless, overly excited and hyperactive, or trouble sleeping. These symptoms can happen at any time, but are more common in the beginning of treatment or after a change in dose. Call your care team right away if you notice any of these symptoms. You may get drowsy or dizzy. Do not drive, use machinery, or do anything that needs mental alertness until you know how this medication affects you. Do not stand or sit up quickly, especially if you are an older patient. This reduces the risk of dizzy or fainting spells. Alcohol may interfere with the effect of this medication. Avoid alcoholic drinks. This medication may increase blood sugar. The risk may be higher in patients who already have diabetes. Ask your care team what you can do to lower your risk of diabetes while taking this medication. This medication can cause an increase in blood pressure. This medication can also cause a sudden drop in your blood pressure, which may make you feel faint and increase the chance of a fall. These effects are most common when you first start the medication or when the dose is increased, or during use of other medications that can cause a sudden drop in blood pressure. Check with your care team for instructions on monitoring your blood pressure while taking  this medication. Your mouth may get dry. Chewing sugarless gum or sucking hard candy, and drinking plenty of water, may help. Contact your care team if the problem does not go away or is severe. What side effects may I notice from receiving this medication? Side effects that you should report to your care team as soon as possible: Allergic reactions--skin rash, itching, hives, swelling of the face, lips,  tongue, or throat Bleeding--bloody or black, tar-like stools, red or dark brown urine, vomiting blood or brown material that looks like coffee grounds, small, red or purple spots on skin, unusual bleeding or bruising Increase in blood pressure Liver injury--right upper belly pain, loss of appetite, nausea, light-colored stool, dark yellow or brown urine, yellowing skin or eyes, unusual weakness or fatigue Low sodium level--muscle weakness, fatigue, dizziness, headache, confusion Redness, blistering, peeling, or loosening of the skin, including inside the mouth Serotonin syndrome--irritability, confusion, fast or irregular heartbeat, muscle stiffness, twitching muscles, sweating, high fever, seizures, chills, vomiting, diarrhea Sudden eye pain or change in vision such as blurry vision, seeing halos around lights, vision loss Thoughts of suicide or self-harm, worsening mood, feelings of depression Trouble passing urine Side effects that usually do not require medical attention (report to your care team if they continue or are bothersome): Change in sex drive or performance Constipation Diarrhea Dizziness Dry mouth Excessive sweating Loss of appetite Nausea Vomiting This list may not describe all possible side effects. Call your doctor for medical advice about side effects. You may report side effects to FDA at 1-800-FDA-1088. Where should I keep my medication? Keep out of the reach of children and pets. Store at room temperature between 15 and 30 degrees C (59 to 86 degrees F). Get rid of any unused medication after the expiration date. To get rid of medications that are no longer needed or have expired: Take the medication to a medication take-back program. Check with your pharmacy or law enforcement to find a location. If you cannot return the medication, check the label or package insert to see if the medication should be thrown out in the garbage or flushed down the toilet. If you are not  sure, ask your care team. If it is safe to put it in the trash, take the medication out of the container. Mix the medication with cat litter, dirt, coffee grounds, or other unwanted substance. Seal the mixture in a bag or container. Put it in the trash. NOTE: This sheet is a summary. It may not cover all possible information. If you have questions about this medicine, talk to your doctor, pharmacist, or health care provider.  2023 Elsevier/Gold Standard (2020-06-16 00:00:00)

## 2021-12-24 DIAGNOSIS — N1832 Chronic kidney disease, stage 3b: Secondary | ICD-10-CM | POA: Diagnosis not present

## 2021-12-27 ENCOUNTER — Other Ambulatory Visit: Payer: Self-pay | Admitting: *Deleted

## 2021-12-27 ENCOUNTER — Telehealth: Payer: Self-pay | Admitting: Internal Medicine

## 2021-12-27 ENCOUNTER — Ambulatory Visit: Payer: Medicare PPO | Admitting: Physician Assistant

## 2021-12-27 ENCOUNTER — Ambulatory Visit (HOSPITAL_COMMUNITY)
Admission: RE | Admit: 2021-12-27 | Discharge: 2021-12-27 | Disposition: A | Discharge status: Home / Self Care | Payer: Medicare PPO | Source: Ambulatory Visit | Attending: Surgery | Admitting: Surgery

## 2021-12-27 ENCOUNTER — Ambulatory Visit (INDEPENDENT_AMBULATORY_CARE_PROVIDER_SITE_OTHER)
Admission: RE | Admit: 2021-12-27 | Discharge: 2021-12-27 | Disposition: A | Discharge status: Home / Self Care | Payer: Medicare PPO | Source: Ambulatory Visit | Attending: Surgery | Admitting: Surgery

## 2021-12-27 VITALS — BP 147/73 | HR 66 | Temp 97.8°F | Resp 20 | Ht 61.0 in | Wt 155.7 lb

## 2021-12-27 DIAGNOSIS — I739 Peripheral vascular disease, unspecified: Secondary | ICD-10-CM

## 2021-12-27 DIAGNOSIS — I70213 Atherosclerosis of native arteries of extremities with intermittent claudication, bilateral legs: Secondary | ICD-10-CM | POA: Diagnosis not present

## 2021-12-27 DIAGNOSIS — I70211 Atherosclerosis of native arteries of extremities with intermittent claudication, right leg: Secondary | ICD-10-CM | POA: Diagnosis not present

## 2021-12-27 NOTE — Telephone Encounter (Signed)
Lft pt vm to call ofc . thanks 

## 2021-12-27 NOTE — Progress Notes (Signed)
Established PAD   History of Present Illness   Natalie Watts is a 76 y.o. (1945/06/23) female who presents with chief complaint of bilateral thigh pain. The patient has a history of PAD. She has received bilateral SFA stenting in December 2020 and left SFA in stent angioplasty in March 2021, both in [place]. On 06/01/2021 the patient underwent right SFA stent angioplasty. At that time, it was visualized that her common femoral and profundofemoral arteries bilaterally were patent without stenosis. It was also shown that her left SFA was occluded. She had 3 vessel runoff to the ankle in the right lower extremity and 2 vessel runoff in the left lower extremity vis peroneal and anterior tibial artery.  The patient comes in today sooner than her usual surveillance reporting worsening bilateral thigh pain. She says it has been going on for 2-3 months. The pain is in both anterior thighs and goes down to her medial calf. The pain is fairly constant and occasionally aggravated by walking after sitting for long periods of time. She describes the pain as "pins and needles". She has seen multiple neurosurgeons to compare opinions on surgical interventions vs medical management. Her PCP has recently prescribed her a small supply of Percocet to deal with the pain, which helps. The patient reports that 3 out of 4 neurosurgeons she has seen regarding this pain have recommended surgery on her back. She has a known, long standing history of cervical, thoracic, and lumbar spine issues. She still experiences right foot drop due to a pinched nerve.  She has come today to rule out any vascular issues that could be contributing to her pain. She is hesitant to pursue spine surgery at this time.   She denies rest pain, claudication, or non healing wounds of the lower extremities. She denies swelling in her lower legs.   Current Outpatient Medications  Medication Sig Dispense Refill   acetaminophen (TYLENOL) 500 MG  tablet Take 1,000 mg by mouth 2 (two) times daily as needed for moderate pain or mild pain. Rapid release     amLODipine (NORVASC) 5 MG tablet TAKE 1 TABLET BY MOUTH EVERY DAY 90 tablet 1   aspirin EC 81 MG tablet Take 1 tablet (81 mg total) by mouth daily. Swallow whole. 150 tablet 2   B-D UF III MINI PEN NEEDLES 31G X 5 MM MISC Inject 1 each into the skin 2 (two) times daily. Or preferred choice of the patient 300 each 3   Blood Glucose Monitoring Suppl (ONE TOUCH ULTRA 2) w/Device KIT Used to check blood sugars daily. 1 each 0   Cholecalciferol (VITAMIN D3) 50 MCG (2000 UT) capsule Take 2,000 Units by mouth daily.     clopidogrel (PLAVIX) 75 MG tablet TAKE 1 TABLET BY MOUTH EVERY DAY 90 tablet 1   Continuous Blood Gluc Receiver (FREESTYLE LIBRE 2 READER) DEVI 1 Device by Does not apply route every 14 (fourteen) days. 1 each 0   Dulaglutide (TRULICITY) 1.5 VO/5.3GU SOPN Inject 1.5 mg into the skin once a week. D/c 0.75 3 mL 2   DULoxetine (CYMBALTA) 30 MG capsule Take 1 capsule (30 mg total) by mouth daily. 90 capsule 3   ezetimibe (ZETIA) 10 MG tablet Take 1 tablet (10 mg total) by mouth daily. 90 tablet 3   Lancets Ultra Fine MISC 1 Device by Does not apply route 2 (two) times daily as needed. Preferred for her device 180 each 3   LANTUS SOLOSTAR 100 UNIT/ML Solostar Pen Inject  40 Units into the skin daily. 15 mL 11   leflunomide (ARAVA) 20 MG tablet Take 20 mg by mouth daily. rheumatology     losartan (COZAAR) 100 MG tablet Take 1 tablet (100 mg total) by mouth daily.     nebivolol (BYSTOLIC) 5 MG tablet Take 1 tablet (5 mg total) by mouth daily. Dc tenoretic 90 tablet 3   omeprazole (PRILOSEC) 40 MG capsule Take 1 tablet by mouth daily.     oxyCODONE-acetaminophen (PERCOCET/ROXICET) 5-325 MG tablet Take 1 tablet by mouth 2 (two) times daily as needed for up to 7 days for severe pain. 14 tablet 0   potassium chloride SA (KLOR-CON M) 20 MEQ tablet Take 1 tablet (20 mEq total) by mouth every  other day. 90 tablet 3   pravastatin (PRAVACHOL) 40 MG tablet TAKE 1 TABLET BY MOUTH EVERY DAY 90 tablet 3   XARELTO 2.5 MG TABS tablet Take 2.5 mg by mouth 2 (two) times daily.     No current facility-administered medications for this visit.    REVIEW OF SYSTEMS (negative unless checked):   Cardiac:  '[]'  Chest pain or chest pressure? '[]'  Shortness of breath upon activity? '[]'  Shortness of breath when lying flat? '[]'  Irregular heart rhythm?  Vascular:  '[x]'  Pain in calf, thigh, or hip brought on by walking? '[]'  Pain in feet at night that wakes you up from your sleep? '[]'  Blood clot in your veins? '[]'  Leg swelling?  Pulmonary:  '[]'  Oxygen at home? '[]'  Productive cough? '[]'  Wheezing?  Neurologic:  '[]'  Sudden weakness in arms or legs? '[]'  Sudden numbness in arms or legs? '[]'  Sudden onset of difficult speaking or slurred speech? '[]'  Temporary loss of vision in one eye? '[]'  Problems with dizziness?  Gastrointestinal:  '[]'  Blood in stool? '[]'  Vomited blood?  Genitourinary:  '[]'  Burning when urinating? '[]'  Blood in urine?  Psychiatric:  '[]'  Major depression  Hematologic:  '[]'  Bleeding problems? '[]'  Problems with blood clotting?  Dermatologic:  '[]'  Rashes or ulcers?  Constitutional:  '[]'  Fever or chills?  Ear/Nose/Throat:  '[]'  Change in hearing? '[]'  Nose bleeds? '[]'  Sore throat?  Musculoskeletal:  '[x]'  Back pain? '[]'  Joint pain? '[]'  Muscle pain?   Physical Examination   Vitals:   12/27/21 1407  BP: (!) 147/73  Pulse: 66  Resp: 20  Temp: 97.8 F (36.6 C)  TempSrc: Temporal  SpO2: 97%  Weight: 155 lb 11.2 oz (70.6 kg)  Height: '5\' 1"'  (1.549 m)   Body mass index is 29.42 kg/m.  General:  WDWN in NAD; vital signs documented above Gait: Not observed HENT: WNL, normocephalic Pulmonary: normal non-labored breathing , without rales, rhonchi, wheezing Cardiac: regular HR, without murmurs, without carotid bruit Abdomen: soft, NT, no masses Skin: without rashes Vascular  Exam/Pulses: 2+ femoral pulses. Non palpable pedal pulses bilaterally, however feet are warm and well perfused Extremities: without ischemic changes, without Gangrene , without cellulitis; without open wounds;  Musculoskeletal: no muscle wasting or atrophy  Neurologic: A&O X 3;  No focal weakness or paresthesias are detected Psychiatric:  The pt has Normal affect.  Non-Invasive Vascular Imaging ABI (12/27/21)  Right    Rt Pressure (mmHg)IndexWaveformComment           +---------+------------------+-----+--------+----------------+  Brachial 166                                              +---------+------------------+-----+--------+----------------+  PTA                             biphasicnon-compressible  +---------+------------------+-----+--------+----------------+  DP       161               0.95 biphasic                  +---------+------------------+-----+--------+----------------+  Great Toe110               0.65                           +---------+------------------+-----+--------+----------------+   +---------+------------------+-----+----------+-------+  Left     Lt Pressure (mmHg)IndexWaveform  Comment  +---------+------------------+-----+----------+-------+  Brachial 169                                       +---------+------------------+-----+----------+-------+  PTA      98                0.58 monophasic         +---------+------------------+-----+----------+-------+  DP       108               0.64 monophasic         +---------+------------------+-----+----------+-------+  Great Toe83                0.49                    +---------+------------------+-----+----------+-------+   +-------+-----------+-----------+------------+------------+  ABI/TBIToday's ABIToday's TBIPrevious ABIPrevious TBI  +-------+-----------+-----------+------------+------------+  Right  Natalie Watts         0.65       1.21         0.92          +-------+-----------+-----------+------------+------------+  Left   0.64       0.49       0.60        0.58          +-------+-----------+-----------+------------+------------+   Lower Extremity Arterial Duplex (12/27/2021)  +----------+--------+-----+---------------+--------+--------+  RIGHT     PSV cm/sRatioStenosis       WaveformComments  +----------+--------+-----+---------------+--------+--------+  CFA Distal180                         biphasic          +----------+--------+-----+---------------+--------+--------+  SFA Prox  245          50-74% stenosisbiphasic          +----------+--------+-----+---------------+--------+--------+        Right Stent(s):  +---------------+--------+--------+--------+--------+  SFA            PSV cm/sStenosisWaveformComments  +---------------+--------+--------+--------+--------+  Prox to Stent  153             biphasic          +---------------+--------+--------+--------+--------+  Proximal Stent 133             biphasic          +---------------+--------+--------+--------+--------+  Mid Stent      70              biphasic          +---------------+--------+--------+--------+--------+  Distal Stent   76              biphasic          +---------------+--------+--------+--------+--------+  Distal to Stent79              biphasic          +---------------+--------+--------+--------+--------+   Medical Decision Making   Natalie Watts is a 76 y.o. female who presents with: bilateral thigh pain s/p right SFA stent and known occluded left SFA  Based of the patient's description of her pain and location, I have explained that it is likely caused by her spine issues. I have explained that her pain seems characteristic of nerve pain, and that issues with her current SFA stents would not cause thigh pain.  The patients blood flow is essentially unchanged from 07/05/2021. R ABI  was 1.21 and today it is 0.95. L ABI was 0.6 and today it is 0.64. There is evidence of some increased stenosis in the right SFA, however her right SFA stent is patent without increased velocities. The patient is also without rest pain, claudication, or non healing wounds. I discussed in depth with the patient the nature of atherosclerosis, and emphasized the importance of maximal medical management including strict control of blood pressure, blood glucose, and lipid levels, obtaining regular exercise, and cessation of smoking.   I discussed in depth with the patient the need for long term surveillance to improve the primary assisted patency of his bypass.  The patient agrees to cooperate with such. The patient is currently on a statin: Pravachol.  The patient is currently on an anti-platelet: ASA and Plavix. The patient is also taking Xarelto 2.61m daily. The patient is agreeable to follow up in 6 months to re-evaluate blood flow with bilateral ABIs and right arterial duplex. She understands the importance of returning earlier if she develops worsening symptoms.    MVicente SerenePA-C [facility] GMountain HouseOffice: 3[facility] South ClinicMD: BTrula Slade

## 2021-12-27 NOTE — Telephone Encounter (Signed)
I spoke with pt to inform that pt does not quailfy for PCS services due to not having medicaid. I did mention to pt that she can pay out of pocket I gave pt the [facility] [place]. Pt understood.

## 2021-12-27 NOTE — Telephone Encounter (Signed)
Or touched by an angel  Thank you

## 2021-12-28 ENCOUNTER — Telehealth: Payer: Self-pay | Admitting: Internal Medicine

## 2021-12-28 NOTE — Telephone Encounter (Signed)
Who is on her xarelto bottle I dont believe Ive filled this for her?

## 2021-12-28 NOTE — Telephone Encounter (Signed)
Pt out of xarelto will you all fill this for her for PAD?

## 2021-12-28 NOTE — Telephone Encounter (Signed)
Can cardiology or vascular Rx xarelto for pt she is out?   Thank you

## 2021-12-28 NOTE — Telephone Encounter (Signed)
Its also used for PAD not sure why she is taking it or if its needed from vascular standpoint for PAD?

## 2021-12-28 NOTE — Telephone Encounter (Signed)
Pt would like a refill for XARELTO 2.5 MG TABS tablet  Pharmacy is   [id number] [place]  [phone] Fax:  [phone]   Please advise and Thank you!

## 2021-12-28 NOTE — Telephone Encounter (Signed)
What kind of doctor was Dr. Collie Siad? And why did he or she have her on this for peripheral artery disease, her heart what was the reason she was prescribed this blood thinner?

## 2021-12-29 MED ORDER — XARELTO 2.5 MG PO TABS: 2.5000 mg | ORAL_TABLET | Freq: Two times a day (BID) | ORAL | 11 refills | Status: DC

## 2021-12-29 NOTE — Telephone Encounter (Signed)
Per Dr. Tamala Julian: Belva Crome, MD  Drue Dun, is is okay to refill the Xarelto 2.5 mg BID for this patient.   [facility]arelto 2.'5mg'$  [facility] [place] [place].

## 2021-12-29 NOTE — Addendum Note (Signed)
Addended by: Molli Barrows on: 12/29/2021 08:55 AM   Modules accepted: Orders

## 2022-01-03 ENCOUNTER — Other Ambulatory Visit: Payer: Self-pay

## 2022-01-03 DIAGNOSIS — I739 Peripheral vascular disease, unspecified: Secondary | ICD-10-CM

## 2022-01-04 ENCOUNTER — Telehealth: Payer: Self-pay

## 2022-01-04 NOTE — Telephone Encounter (Signed)
Lele called from [place] to state patient had a fall last night.  Lele states patient toppled over with her walker and went down easy.  Lele states patient has no injuries from her fall.  Lele states patient's son-in-law was able to help patient up.

## 2022-01-05 DIAGNOSIS — M79642 Pain in left hand: Secondary | ICD-10-CM | POA: Diagnosis not present

## 2022-01-05 DIAGNOSIS — M0579 Rheumatoid arthritis with rheumatoid factor of multiple sites without organ or systems involvement: Secondary | ICD-10-CM | POA: Diagnosis not present

## 2022-01-05 DIAGNOSIS — E663 Overweight: Secondary | ICD-10-CM | POA: Diagnosis not present

## 2022-01-05 DIAGNOSIS — Z6829 Body mass index (BMI) 29.0-29.9, adult: Secondary | ICD-10-CM | POA: Diagnosis not present

## 2022-01-05 DIAGNOSIS — G8929 Other chronic pain: Secondary | ICD-10-CM | POA: Diagnosis not present

## 2022-01-05 DIAGNOSIS — M1991 Primary osteoarthritis, unspecified site: Secondary | ICD-10-CM | POA: Diagnosis not present

## 2022-01-05 DIAGNOSIS — M25562 Pain in left knee: Secondary | ICD-10-CM | POA: Diagnosis not present

## 2022-01-05 DIAGNOSIS — M79641 Pain in right hand: Secondary | ICD-10-CM | POA: Diagnosis not present

## 2022-01-10 DIAGNOSIS — M1712 Unilateral primary osteoarthritis, left knee: Secondary | ICD-10-CM | POA: Diagnosis not present

## 2022-01-10 DIAGNOSIS — M1711 Unilateral primary osteoarthritis, right knee: Secondary | ICD-10-CM | POA: Diagnosis not present

## 2022-01-10 DIAGNOSIS — M170 Bilateral primary osteoarthritis of knee: Secondary | ICD-10-CM | POA: Diagnosis not present

## 2022-01-14 ENCOUNTER — Encounter: Payer: Self-pay | Admitting: Internal Medicine

## 2022-01-17 DIAGNOSIS — Z6829 Body mass index (BMI) 29.0-29.9, adult: Secondary | ICD-10-CM | POA: Diagnosis not present

## 2022-01-17 DIAGNOSIS — Z20822 Contact with and (suspected) exposure to covid-19: Secondary | ICD-10-CM | POA: Diagnosis not present

## 2022-01-17 DIAGNOSIS — U071 COVID-19: Secondary | ICD-10-CM | POA: Diagnosis not present

## 2022-01-19 ENCOUNTER — Other Ambulatory Visit: Payer: Self-pay | Admitting: Internal Medicine

## 2022-01-19 ENCOUNTER — Other Ambulatory Visit: Payer: Medicare PPO

## 2022-01-19 DIAGNOSIS — R937 Abnormal findings on diagnostic imaging of other parts of musculoskeletal system: Secondary | ICD-10-CM

## 2022-01-19 DIAGNOSIS — M5416 Radiculopathy, lumbar region: Secondary | ICD-10-CM

## 2022-01-19 DIAGNOSIS — M47812 Spondylosis without myelopathy or radiculopathy, cervical region: Secondary | ICD-10-CM

## 2022-01-19 DIAGNOSIS — G8929 Other chronic pain: Secondary | ICD-10-CM

## 2022-01-19 DIAGNOSIS — R269 Unspecified abnormalities of gait and mobility: Secondary | ICD-10-CM

## 2022-01-19 DIAGNOSIS — M48062 Spinal stenosis, lumbar region with neurogenic claudication: Secondary | ICD-10-CM

## 2022-01-19 DIAGNOSIS — M48061 Spinal stenosis, lumbar region without neurogenic claudication: Secondary | ICD-10-CM

## 2022-01-19 DIAGNOSIS — M4804 Spinal stenosis, thoracic region: Secondary | ICD-10-CM

## 2022-01-19 MED ORDER — OXYCODONE-ACETAMINOPHEN 5-325 MG PO TABS: 1.0000 | ORAL_TABLET | Freq: Two times a day (BID) | ORAL | 0 refills | Status: AC | PRN

## 2022-01-20 NOTE — Addendum Note (Signed)
Addended by: Orland Mustard on: 01/20/2022 12:49 PM   Modules accepted: Orders

## 2022-01-24 ENCOUNTER — Telehealth: Payer: Self-pay | Admitting: Internal Medicine

## 2022-01-24 NOTE — Telephone Encounter (Signed)
Natalie Watts, from [place], wanted to let the provider now that Dr Lenord Carbo has reviewed the patient and will be declining the patient.

## 2022-01-31 ENCOUNTER — Encounter (HOSPITAL_COMMUNITY): Payer: Medicare PPO

## 2022-01-31 ENCOUNTER — Ambulatory Visit
Admission: RE | Admit: 2022-01-31 | Discharge: 2022-01-31 | Disposition: A | Discharge status: Home / Self Care | Payer: Medicare PPO | Source: Ambulatory Visit | Attending: Internal Medicine | Admitting: Internal Medicine

## 2022-01-31 ENCOUNTER — Telehealth: Payer: Self-pay

## 2022-01-31 ENCOUNTER — Ambulatory Visit: Payer: Medicare PPO

## 2022-01-31 DIAGNOSIS — Z780 Asymptomatic menopausal state: Secondary | ICD-10-CM | POA: Diagnosis not present

## 2022-01-31 DIAGNOSIS — E2839 Other primary ovarian failure: Secondary | ICD-10-CM | POA: Diagnosis not present

## 2022-01-31 DIAGNOSIS — M85852 Other specified disorders of bone density and structure, left thigh: Secondary | ICD-10-CM | POA: Diagnosis not present

## 2022-01-31 NOTE — Telephone Encounter (Signed)
Spoke with a contact at [facility], pts ref has been declined due to the provider not accepting medicare pts.

## 2022-02-01 ENCOUNTER — Telehealth: Payer: Self-pay

## 2022-02-01 NOTE — Telephone Encounter (Signed)
yes    Raelyn Ensign McLean-Scocuzza, MD 21 hours ago (11:19 AM)    Can I schedule a post COVID follow up check up.  I still have cough with congestion.  Can I have an appointment as soon as possible.

## 2022-02-14 ENCOUNTER — Telehealth: Payer: Self-pay | Admitting: Internal Medicine

## 2022-02-14 NOTE — Telephone Encounter (Signed)
Pt called wanting an update about referral to [facility] Pt would like to be called

## 2022-02-14 NOTE — Telephone Encounter (Signed)
I spoke with pt she would like a call regarding her bone density? Please advise and Thank you!  Call pt @ [phone]

## 2022-02-15 ENCOUNTER — Telehealth: Payer: Self-pay

## 2022-02-15 NOTE — Telephone Encounter (Signed)
LMOM to CB in regards to :   [name], Natalie Watts 23 hours ago (4:27 PM)   RY I spoke with pt she would like a call regarding her bone density? Please advise and Thank you!   Call pt @ [phone]      Note

## 2022-02-15 NOTE — Telephone Encounter (Signed)
Called pt to get more info on her concerns about bone density. she did not answer so LMOM to CB

## 2022-02-15 NOTE — Telephone Encounter (Signed)
Osteopenia rec tums daily 1 daily and vitamin D3 2000 IU daily  Please check on pain clinic referral

## 2022-02-15 NOTE — Telephone Encounter (Signed)
Pt would like more information on her Bone density because she saw it resulted on MyChart but she doesn't know what it means.

## 2022-02-22 ENCOUNTER — Ambulatory Visit (INDEPENDENT_AMBULATORY_CARE_PROVIDER_SITE_OTHER): Payer: Medicare PPO

## 2022-02-22 ENCOUNTER — Encounter: Payer: Self-pay | Admitting: Internal Medicine

## 2022-02-22 ENCOUNTER — Ambulatory Visit: Payer: Medicare PPO | Admitting: Internal Medicine

## 2022-02-22 VITALS — BP 150/70 | HR 77 | Temp 98.6°F | Ht 61.0 in | Wt 146.8 lb

## 2022-02-22 DIAGNOSIS — I739 Peripheral vascular disease, unspecified: Secondary | ICD-10-CM

## 2022-02-22 DIAGNOSIS — M25551 Pain in right hip: Secondary | ICD-10-CM | POA: Diagnosis not present

## 2022-02-22 DIAGNOSIS — E1122 Type 2 diabetes mellitus with diabetic chronic kidney disease: Secondary | ICD-10-CM

## 2022-02-22 DIAGNOSIS — M1612 Unilateral primary osteoarthritis, left hip: Secondary | ICD-10-CM | POA: Insufficient documentation

## 2022-02-22 DIAGNOSIS — M48062 Spinal stenosis, lumbar region with neurogenic claudication: Secondary | ICD-10-CM

## 2022-02-22 DIAGNOSIS — G629 Polyneuropathy, unspecified: Secondary | ICD-10-CM

## 2022-02-22 DIAGNOSIS — M25562 Pain in left knee: Secondary | ICD-10-CM

## 2022-02-22 DIAGNOSIS — E559 Vitamin D deficiency, unspecified: Secondary | ICD-10-CM | POA: Diagnosis not present

## 2022-02-22 DIAGNOSIS — E782 Mixed hyperlipidemia: Secondary | ICD-10-CM

## 2022-02-22 DIAGNOSIS — N183 Chronic kidney disease, stage 3 unspecified: Secondary | ICD-10-CM | POA: Diagnosis not present

## 2022-02-22 DIAGNOSIS — M545 Low back pain, unspecified: Secondary | ICD-10-CM

## 2022-02-22 DIAGNOSIS — M1712 Unilateral primary osteoarthritis, left knee: Secondary | ICD-10-CM

## 2022-02-22 DIAGNOSIS — Z1231 Encounter for screening mammogram for malignant neoplasm of breast: Secondary | ICD-10-CM

## 2022-02-22 DIAGNOSIS — R937 Abnormal findings on diagnostic imaging of other parts of musculoskeletal system: Secondary | ICD-10-CM

## 2022-02-22 DIAGNOSIS — M48061 Spinal stenosis, lumbar region without neurogenic claudication: Secondary | ICD-10-CM

## 2022-02-22 DIAGNOSIS — E876 Hypokalemia: Secondary | ICD-10-CM

## 2022-02-22 DIAGNOSIS — M160 Bilateral primary osteoarthritis of hip: Secondary | ICD-10-CM

## 2022-02-22 DIAGNOSIS — R911 Solitary pulmonary nodule: Secondary | ICD-10-CM

## 2022-02-22 DIAGNOSIS — R269 Unspecified abnormalities of gait and mobility: Secondary | ICD-10-CM

## 2022-02-22 DIAGNOSIS — R9389 Abnormal findings on diagnostic imaging of other specified body structures: Secondary | ICD-10-CM

## 2022-02-22 DIAGNOSIS — G8929 Other chronic pain: Secondary | ICD-10-CM

## 2022-02-22 DIAGNOSIS — M25552 Pain in left hip: Secondary | ICD-10-CM | POA: Diagnosis not present

## 2022-02-22 DIAGNOSIS — Z789 Other specified health status: Secondary | ICD-10-CM

## 2022-02-22 DIAGNOSIS — E785 Hyperlipidemia, unspecified: Secondary | ICD-10-CM

## 2022-02-22 DIAGNOSIS — M25512 Pain in left shoulder: Secondary | ICD-10-CM

## 2022-02-22 DIAGNOSIS — E1165 Type 2 diabetes mellitus with hyperglycemia: Secondary | ICD-10-CM

## 2022-02-22 DIAGNOSIS — I10 Essential (primary) hypertension: Secondary | ICD-10-CM

## 2022-02-22 DIAGNOSIS — M5412 Radiculopathy, cervical region: Secondary | ICD-10-CM

## 2022-02-22 DIAGNOSIS — M47812 Spondylosis without myelopathy or radiculopathy, cervical region: Secondary | ICD-10-CM

## 2022-02-22 DIAGNOSIS — M4804 Spinal stenosis, thoracic region: Secondary | ICD-10-CM

## 2022-02-22 DIAGNOSIS — M25561 Pain in right knee: Secondary | ICD-10-CM

## 2022-02-22 DIAGNOSIS — M21371 Foot drop, right foot: Secondary | ICD-10-CM

## 2022-02-22 DIAGNOSIS — Z8739 Personal history of other diseases of the musculoskeletal system and connective tissue: Secondary | ICD-10-CM

## 2022-02-22 DIAGNOSIS — I152 Hypertension secondary to endocrine disorders: Secondary | ICD-10-CM

## 2022-02-22 DIAGNOSIS — G5603 Carpal tunnel syndrome, bilateral upper limbs: Secondary | ICD-10-CM

## 2022-02-22 DIAGNOSIS — R918 Other nonspecific abnormal finding of lung field: Secondary | ICD-10-CM

## 2022-02-22 DIAGNOSIS — M542 Cervicalgia: Secondary | ICD-10-CM

## 2022-02-22 DIAGNOSIS — M7552 Bursitis of left shoulder: Secondary | ICD-10-CM

## 2022-02-22 DIAGNOSIS — Z7409 Other reduced mobility: Secondary | ICD-10-CM

## 2022-02-22 DIAGNOSIS — M25511 Pain in right shoulder: Secondary | ICD-10-CM

## 2022-02-22 DIAGNOSIS — I2510 Atherosclerotic heart disease of native coronary artery without angina pectoris: Secondary | ICD-10-CM

## 2022-02-22 DIAGNOSIS — L309 Dermatitis, unspecified: Secondary | ICD-10-CM

## 2022-02-22 DIAGNOSIS — E1159 Type 2 diabetes mellitus with other circulatory complications: Secondary | ICD-10-CM

## 2022-02-22 DIAGNOSIS — M4722 Other spondylosis with radiculopathy, cervical region: Secondary | ICD-10-CM

## 2022-02-22 DIAGNOSIS — M5416 Radiculopathy, lumbar region: Secondary | ICD-10-CM

## 2022-02-22 HISTORY — DX: Unilateral primary osteoarthritis, left hip: M16.12

## 2022-02-22 HISTORY — DX: Bilateral primary osteoarthritis of hip: M16.0

## 2022-02-22 LAB — CBC WITH DIFFERENTIAL/PLATELET
Basophils Absolute: 0 10*3/uL (ref 0.0–0.1)
Basophils Relative: 0.5 % (ref 0.0–3.0)
Eosinophils Absolute: 0.1 10*3/uL (ref 0.0–0.7)
Eosinophils Relative: 1 % (ref 0.0–5.0)
HCT: 39.5 % (ref 36.0–46.0)
Hemoglobin: 12.8 g/dL (ref 12.0–15.0)
Lymphocytes Relative: 31.1 % (ref 12.0–46.0)
Lymphs Abs: 1.9 10*3/uL (ref 0.7–4.0)
MCHC: 32.5 g/dL (ref 30.0–36.0)
MCV: 90.1 fl (ref 78.0–100.0)
Monocytes Absolute: 0.4 10*3/uL (ref 0.1–1.0)
Monocytes Relative: 6.8 % (ref 3.0–12.0)
Neutro Abs: 3.7 10*3/uL (ref 1.4–7.7)
Neutrophils Relative %: 60.6 % (ref 43.0–77.0)
Platelets: 126 10*3/uL — ABNORMAL LOW (ref 150.0–400.0)
RBC: 4.39 Mil/uL (ref 3.87–5.11)
RDW: 15.3 % (ref 11.5–15.5)
WBC: 6.1 10*3/uL (ref 4.0–10.5)

## 2022-02-22 LAB — COMPREHENSIVE METABOLIC PANEL
ALT: 16 U/L (ref 0–35)
AST: 21 U/L (ref 0–37)
Albumin: 4.1 g/dL (ref 3.5–5.2)
Alkaline Phosphatase: 79 U/L (ref 39–117)
BUN: 20 mg/dL (ref 6–23)
CO2: 25 mEq/L (ref 19–32)
Calcium: 9.7 mg/dL (ref 8.4–10.5)
Chloride: 105 mEq/L (ref 96–112)
Creatinine, Ser: 0.92 mg/dL (ref 0.40–1.20)
GFR: 60.48 mL/min (ref >60.00)
Glucose, Bld: 180 mg/dL — ABNORMAL HIGH (ref 70–99)
Potassium: 3.7 mEq/L (ref 3.5–5.1)
Sodium: 142 mEq/L (ref 135–145)
Total Bilirubin: 0.5 mg/dL (ref 0.2–1.2)
Total Protein: 7.1 g/dL (ref 6.0–8.3)

## 2022-02-22 LAB — LIPID PANEL
Cholesterol: 171 mg/dL (ref 0–200)
HDL: 58.3 mg/dL (ref >39.00)
LDL Cholesterol: 90 mg/dL (ref 0–99)
NonHDL: 112.31
Total CHOL/HDL Ratio: 3
Triglycerides: 114 mg/dL (ref 0.0–149.0)
VLDL: 22.8 mg/dL (ref 0.0–40.0)

## 2022-02-22 LAB — VITAMIN D 25 HYDROXY (VIT D DEFICIENCY, FRACTURES): VITD: 34.13 ng/mL (ref 30.00–100.00)

## 2022-02-22 LAB — HEMOGLOBIN A1C: Hgb A1c MFr Bld: 7.7 % — ABNORMAL HIGH (ref 4.6–6.5)

## 2022-02-22 MED ORDER — OXYCODONE-ACETAMINOPHEN 5-325 MG PO TABS: 1.0000 | ORAL_TABLET | Freq: Two times a day (BID) | ORAL | 0 refills | Status: AC | PRN

## 2022-02-22 MED ORDER — EZETIMIBE 10 MG PO TABS: 10.0000 mg | ORAL_TABLET | Freq: Every day | ORAL | 3 refills | Status: DC

## 2022-02-22 MED ORDER — AMLODIPINE BESYLATE 5 MG PO TABS: 5.0000 mg | ORAL_TABLET | Freq: Every day | ORAL | 3 refills | Status: DC

## 2022-02-22 MED ORDER — DULOXETINE HCL 30 MG PO CPEP: 30.0000 mg | ORAL_CAPSULE | Freq: Every day | ORAL | 3 refills | Status: DC

## 2022-02-22 MED ORDER — TRULICITY 1.5 MG/0.5ML ~~LOC~~ SOAJ: 1.5000 mg | SUBCUTANEOUS | 2 refills | Status: DC

## 2022-02-22 MED ORDER — NEBIVOLOL HCL 5 MG PO TABS: 5.0000 mg | ORAL_TABLET | Freq: Every day | ORAL | 3 refills | Status: DC

## 2022-02-22 MED ORDER — PRAVASTATIN SODIUM 40 MG PO TABS: 40.0000 mg | ORAL_TABLET | Freq: Every day | ORAL | 3 refills | Status: DC

## 2022-02-22 MED ORDER — LOSARTAN POTASSIUM 100 MG PO TABS: 100.0000 mg | ORAL_TABLET | Freq: Every day | ORAL | Status: DC

## 2022-02-22 MED ORDER — POTASSIUM CHLORIDE CRYS ER 20 MEQ PO TBCR: 20.0000 meq | EXTENDED_RELEASE_TABLET | ORAL | 3 refills | Status: AC

## 2022-02-22 MED ORDER — CLOBETASOL PROPIONATE 0.05 % EX CREA: 1.0000 | TOPICAL_CREAM | Freq: Two times a day (BID) | CUTANEOUS | 5 refills | Status: DC

## 2022-02-22 MED ORDER — LANTUS SOLOSTAR 100 UNIT/ML ~~LOC~~ SOPN: 40.0000 [IU] | PEN_INJECTOR | Freq: Every day | SUBCUTANEOUS | 11 refills | Status: DC

## 2022-02-22 NOTE — Patient Instructions (Addendum)
Repeat CT chest scan to follow up lung nodules [place] 4.7 6 [facility] in [place], [place] Address: [address], [place], [place] [address] Hours:  Open now    [place] full hours    Phone: [phone]

## 2022-02-22 NOTE — Progress Notes (Addendum)
Chief Complaint  Patient presents with   Follow-up    2 month f/u pt hasn't taken bp meds today   F/u 1. 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 Chronic hip and knee pain est [facility] and rheumatology s/p joint surgery right total hip but having moderate right > left hip pain tried otc tylenol and not helping  Chronic pain b/l knees seen [facility] had injection in both knees and still with mild to moderate pain and on Arava from rheumatology  3. CAD/PAD on aspirin 81, plavix 75 mg qd and xarelto 2.5 mg bid  4. Abnormal CT chest  IMPRESSION: 1. Small bilateral pulmonary nodules, largest measuring 4 mm. Prior exams are unavailable for comparison. If prior exams become available, comparison will be made and an addendum will be issued at that time. Recommendations provided or further current exam in the absence of comparisons. Per Fleischner Society Guidelines, a non-contrast Chest CT at 12 months is optional. If performed and the nodule is stable at 12 months, no further follow-up is recommended. These guidelines do not apply to immunocompromised patients and patients with cancer. Follow up in patients with significant comorbidities as clinically warranted. For lung cancer screening, adhere to Lung-RADS guidelines. Reference: Radiology. 2017; 284(1):228-43. 2. Mild emphysema. 3. Faint areas of subpleural reticulation and occasional areas of subpleural ground-glass. Mild multifocal bronchiectasis. Findings can be seen in the setting of interstitial lung disease. As clinically indicated, follow-up high-resolution chest CT could be considered for interstitial lung disease assessment. 4. Extensive coronary artery calcifications. Aortic atherosclerosis.   Aortic Atherosclerosis (ICD10-I70.0) and Emphysema (ICD10-J43.9).     Review of Systems   Constitutional:  Negative for weight loss.  HENT:  Negative for hearing loss.   Eyes:  Negative for blurred vision.  Respiratory:  Negative for shortness of breath.   Cardiovascular:  Negative for chest pain.  Gastrointestinal:  Negative for abdominal pain and blood in stool.  Genitourinary:  Negative for dysuria.  Musculoskeletal:  Positive for joint pain. Negative for falls.  Skin:  Negative for rash.  Neurological:  Negative for headaches.  Psychiatric/Behavioral:  Negative for depression.    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 Loann Quill [facility]    COVID 19+    mid 11/2020   COVID-19    01/17/22   Degenerative disk disease    Diabetes ([place])    DVT (deep venous thrombosis) ([place])    DVT, lower extremity ([place])    left, 05/2013 on Xarelto x 1 month etiology unkown    GERD (gastroesophageal reflux disease)    Gout    H/O myocardial perfusion scan    06/08/20 low  risk   Hammer toes, bilateral    HSV-2 infection    Hyperlipidemia    Hypertension    Insomnia    Intertrigo    Lumbar radiculopathy    improved after steroid inj and PT 10/21/20 had right L4/5 L5-S1 transforaminal epidural steroid injections   Onychomycosis    OSA on CPAP    dx in 09-08-18 after hip surgery CPAP 9-13 cm H20 supplies [employer] PSG 03/18/2019 severe OSA AHI 42.1/hr titration 03/27/2019 rec APAP 9-13 cm H20   PAD (peripheral artery disease) (HCC)    1 x right, 3 x left in  2020-2022 needs bypass on leg had CTA with run off 11/2020 [place]   PAD (peripheral artery disease) (HCC)    with stents   Peripheral vascular disease ([place])    Right foot drop    since 09/08/2018   Subdeltoid bursitis    Tinea pedis    Vitamin D deficiency    Past Surgical History:  Procedure Laterality Date   ABDOMINAL AORTOGRAM W/LOWER EXTREMITY N/A 06/01/2021   Procedure: ABDOMINAL AORTOGRAM W/LOWER EXTREMITY;  Surgeon: Serafina Mitchell, MD;  Location: [facility];  Service: Cardiovascular;  Laterality: N/A;   BACK SURGERY     cervical laminoplasty C3-C7 Rex Dr. Sheppard Evens fusion/decompression 2/2 myelopathy 03/2014 or 02/2015   COLON SURGERY     colectomy in 2013-09-07   COLONOSCOPY  05/01/2020   [place] [facility] Dr. Edwena Bunde [place] 12 mm d colon polyp, grade 1 IH, moderate diverticulosis tubular   ESOPHAGOGASTRODUODENOSCOPY  05/01/2020   erosive gastritis Dr. Tilford Pillar [place]   PERIPHERAL VASCULAR INTERVENTION  06/01/2021   Procedure: PERIPHERAL VASCULAR INTERVENTION;  Surgeon: Serafina Mitchell, MD;  Location: [facility];  Service: Cardiovascular;;  RT. SFA   right total hip Right    03/05/2019 or 02/26/2020   TUBAL LIGATION     UPPER GI ENDOSCOPY     05/01/20   VASCULAR SURGERY     08/19/19   VASCULAR SURGERY     07/05/19   VASCULAR SURGERY     05/21/2019 angioplasty since 05/2019, 06/2019 and 09-08-19 had 2 left leg and 1 on right leg   Family History  Problem Relation Age of Onset   Hypertension Mother    Arthritis Mother    Cancer Father        colon cancer dx'ed died age 20   Hypertension Sister    Arthritis Sister    Cancer Sister        died in 61s brain tumor h/o lung cancer smoker died 08-Sep-2015    Early death Sister    Breast cancer Maternal Grandmother    Cancer Maternal Grandmother        breast dx older age    Hypertension Maternal Grandmother    Breast cancer Other        maternal neice   Cancer Other        died in 18s     Hypertension Daughter    Hypertension Daughter    Social History   Socioeconomic History   Marital status: Divorced    Spouse name: Not on file   Number of children: Not on file   Years of education: Not on file   Highest education level: Not on file  Occupational History   Not on file  Tobacco Use   Smoking status: Former    Packs/day: 1.00    Years: 45.00    Total pack  years: 45.00    Types: Cigarettes    Quit date: 12/18/2012    Years since quitting: 9.1    Passive exposure: Never   Smokeless tobacco: Never  Vaping Use   Vaping Use: Never used  Substance and Sexual Activity   Alcohol use: No   Drug use: Yes    Frequency: 3.0 times per week   Sexual activity: Not on file  Other Topics Concern   Not on file  Social History Narrative   Divorced    2 daughters [name] comes to most appts and is Chief Executive Officer    -lives with daughter [name]    Used to work for [employer] now [employer]    Former smoker age 59 to age 76/2012 quit for sure in 2014 max 1/2 ppd FH lung cancer sister also smoker    Social Determinants of Health   Financial Resource Strain: Low Risk  (01/02/2018)   Overall Financial Resource Strain (CARDIA)    Difficulty of Paying Living Expenses: Not hard at all  Food Insecurity: No Food Insecurity (09/22/2021)   Hunger Vital Sign    Worried About Running Out of Food in the Last Year: Never true    [place] in the Last Year: Never true  Transportation Needs: No Transportation Needs (09/22/2021)   PRAPARE - Hydrologist (Medical): No    Lack of Transportation (Non-Medical): No  Physical Activity: Insufficiently Active (09/22/2021)   Exercise Vital Sign    Days of Exercise per Week: 1 day    Minutes of Exercise per Session: 60 min  Stress: No Stress Concern Present (09/22/2021)   [place]    Feeling of Stress : Not at all  Social Connections: Unknown  (09/22/2021)   Social Connection and Isolation Panel [NHANES]    Frequency of Communication with Friends and Family: More than three times a week    Frequency of Social Gatherings with Friends and Family: More than three times a week    Attends Religious Services: More than 4 times per year    Active Member of [employer] or Organizations: Not on file    Attends Archivist Meetings: More than 4 times per year    Marital Status: Not on file  Intimate Partner Violence: Not At Risk (09/22/2021)   Humiliation, Afraid, Rape, and Kick questionnaire    Fear of Current or Ex-Partner: No    Emotionally Abused: No    Physically Abused: No    Sexually Abused: No   Current Meds  Medication Sig   acetaminophen (TYLENOL) 500 MG tablet Take 1,000 mg by mouth 2 (two) times daily as needed for moderate pain or mild pain. Rapid release   aspirin EC 81 MG tablet Take 1 tablet (81 mg total) by mouth daily. Swallow whole.   B-D UF III MINI PEN NEEDLES 31G X 5 MM MISC Inject 1 each into the skin 2 (two) times daily. Or preferred choice of the patient   Blood Glucose Monitoring Suppl (ONE TOUCH ULTRA 2) w/Device KIT Used to check blood sugars daily.   Cholecalciferol (VITAMIN D3) 50 MCG (2000 UT) capsule Take 2,000 Units by mouth daily.   clobetasol cream (TEMOVATE) 1.61 % Apply 1 Application topically 2 (two) times daily. Legs and hands prn   clopidogrel (PLAVIX) 75 MG tablet TAKE 1 TABLET BY MOUTH EVERY DAY   Lancets Ultra Fine MISC 1 Device by Does not apply route  2 (two) times daily as needed. Preferred for her device   leflunomide (ARAVA) 20 MG tablet Take 20 mg by mouth daily. rheumatology   XARELTO 2.5 MG TABS tablet Take 1 tablet (2.5 mg total) by mouth 2 (two) times daily.   [DISCONTINUED] amLODipine (NORVASC) 5 MG tablet TAKE 1 TABLET BY MOUTH EVERY DAY   [DISCONTINUED] Dulaglutide (TRULICITY) 1.5 OZ/3.6UY SOPN Inject 1.5 mg into the skin once a week. D/c 0.75   [DISCONTINUED] DULoxetine (CYMBALTA)  30 MG capsule Take 1 capsule (30 mg total) by mouth daily.   [DISCONTINUED] ezetimibe (ZETIA) 10 MG tablet Take 1 tablet (10 mg total) by mouth daily.   [DISCONTINUED] LANTUS SOLOSTAR 100 UNIT/ML Solostar Pen Inject 40 Units into the skin daily.   [DISCONTINUED] losartan (COZAAR) 100 MG tablet Take 1 tablet (100 mg total) by mouth daily.   [DISCONTINUED] nebivolol (BYSTOLIC) 5 MG tablet Take 1 tablet (5 mg total) by mouth daily. Dc tenoretic   [DISCONTINUED] potassium chloride SA (KLOR-CON M) 20 MEQ tablet Take 1 tablet (20 mEq total) by mouth every other day.   [DISCONTINUED] pravastatin (PRAVACHOL) 40 MG tablet TAKE 1 TABLET BY MOUTH EVERY DAY   Allergies  Allergen Reactions   Lisinopril Anaphylaxis and Swelling     Tongue swelling    Sulfamethoxazole-Trimethoprim Other (See Comments)    Worked against diabetic medication   Farxiga [Dapagliflozin]     diarrhea   Gabapentin    Glipizide     Diarrhea    Metformin And Related     GI sx's diarrhea   Penicillins     Childhood   Tramadol Nausea Only and Nausea And Vomiting   No results found for this or any previous visit (from the past 2160 hour(s)). Objective  Body mass index is 27.74 kg/m. Wt Readings from Last 3 Encounters:  02/22/22 146 lb 12.8 oz (66.6 kg)  12/27/21 155 lb 11.2 oz (70.6 kg)  12/22/21 167 lb 6.4 oz (75.9 kg)   Temp Readings from Last 3 Encounters:  02/22/22 98.6 F (37 C) (Oral)  12/27/21 97.8 F (36.6 C) (Temporal)  12/22/21 98.3 F (36.8 C) (Oral)   BP Readings from Last 3 Encounters:  02/22/22 (!) 150/70  12/27/21 (!) 147/73  12/22/21 130/60   Pulse Readings from Last 3 Encounters:  02/22/22 77  12/27/21 66  12/22/21 80    Physical Exam Vitals and nursing note reviewed.  Constitutional:      Appearance: Normal appearance. She is well-developed and well-groomed.  HENT:     Head: Normocephalic and atraumatic.  Eyes:     Conjunctiva/sclera: Conjunctivae normal.     Pupils: Pupils are  equal, round, and reactive to light.  Cardiovascular:     Rate and Rhythm: Normal rate and regular rhythm.     Heart sounds: Normal heart sounds. No murmur heard. Pulmonary:     Effort: Pulmonary effort is normal.     Breath sounds: Normal breath sounds.  Abdominal:     General: Abdomen is flat. Bowel sounds are normal.     Tenderness: There is no abdominal tenderness.  Musculoskeletal:     Right knee: Tenderness present over the medial joint line, lateral joint line and patellar tendon.     Left knee: Tenderness present over the medial joint line, lateral joint line and patellar tendon.  Skin:    General: Skin is warm and dry.  Neurological:     General: No focal deficit present.     Mental Status: She is  alert and oriented to person, place, and time. Mental status is at baseline.     Cranial Nerves: Cranial nerves 2-12 are intact.     Motor: Motor function is intact.     Coordination: Coordination is intact.     Gait: Gait is intact.     Comments: Walking with walker  Psychiatric:        Attention and Perception: Attention and perception normal.        Mood and Affect: Mood and affect normal.        Speech: Speech normal.        Behavior: Behavior normal. Behavior is cooperative.        Thought Content: Thought content normal.        Cognition and Memory: Cognition and memory normal.        Judgment: Judgment normal.     Assessment  Plan  Chronic pain of both knees and hips s/p right total hip Seen [facility] and given shots b/l knees  Rheumatology in [place] pain clinic referral  HTN elevated not meds today Type 2 diabetes mellitus 7.7 09/30/21 with stage 3 chronic kidney disease, hld, stage 3a or 3b CKD ([place]) - Plan: Comprehensive metabolic panel, Lipid panel, Hemoglobin A1c Norvasc 5 mg qd  Trulcitiy 1.5 weekly  Zetia 10 mg  Losartan 902 mg qd  Bystolic 5 mg qd  Pravastatin 40 mg qd    PAD s/p stents/CAD aspirin 81, plavix 75 mg qd and xarelto  2.5 mg bid  Fu Dr. Tamala Julian cardiology/Dr. Trula Slade will ask what they rec long term anticoagulation   Chronic neck, lower back pain and limited adls and iadls CTS, cervical and lumbar radiculopathy, and thoracic and lumbar spinal stenosis cervical spondylosis, DDD, right foot drop, abnormal gait BL walks with cane, right and left shoulder pain, chronic knee and hip pain b/l, RA. Chronic pain due to all of above -referred H/H PT/OT, nurse and aid  Pt and daughter to check on PCS if covered with insurance if so will fill out form if not disc Touched by an [place] [facility] out of pocket agency for this    Abnormal ct chest  Refer [facility]  IMPRESSION: 1. Small bilateral pulmonary nodules, largest measuring 4 mm. Prior exams are unavailable for comparison. If prior exams become available, comparison will be made and an addendum will be issued at that time. Recommendations provided or further current exam in the absence of comparisons. Per Fleischner Society Guidelines, a non-contrast Chest CT at 12 months is optional. If performed and the nodule is stable at 12 months, no further follow-up is recommended. These guidelines do not apply to immunocompromised patients and patients with cancer. Follow up in patients with significant comorbidities as clinically warranted. For lung cancer screening, adhere to Lung-RADS guidelines. Reference: Radiology. 2017; 284(1):228-43. 2. Mild emphysema. 3. Faint areas of subpleural reticulation and occasional areas of subpleural ground-glass. Mild multifocal bronchiectasis. Findings can be seen in the setting of interstitial lung disease. As clinically indicated, follow-up high-resolution chest CT could be considered for interstitial lung disease assessment. 4. Extensive coronary artery calcifications. Aortic atherosclerosis.   Aortic Atherosclerosis (ICD10-I70.0) and Emphysema (ICD10-J43.9).   HM Flu shot 04/09/21 Given hep B 3/3 shots  pna 23 vx had 11/27/08,  02/26/20 given Dr. Darcel Smalling office  prevnar had 06/20/17 per Dr. Kasandra Knudsen note had in [place] but she had in our office 10/17/17 Will disc shingrix in the future  Td had 02/14/11 and 02/17/18 ?Td vs tdap  Covid 08/29/19, 09/26/19, 04/29/20, 12/02/20 had covid 11/2020 +, consider 5th dose    Shingrix ? If had was noted in Dr. Darcel Smalling note as recorded 06/20/17 ? If had    appt GI Dr. Darien Ramus [phone] colonoscopy 01/2018  Colonoscopy EGD see report from [place] 05/01/20    CT chest with chronic smoking hx quit 2012 or 2014 former smoker 20-30 years 1 pk lasts 2 days  Outside CT chest in [place] w/o contrast 03/13/20 lung small nodules Due for repeat CT chest 02/2021  ordered 02/2022    [place] scan 03/13/20 no AAA aortic atherosclerosis   Diabetic eye exam 01/2020 normal per Dr. Kasandra Knudsen  DM foot exam 03/24/20 per Dr. Kasandra Knudsen Urine protein due 06/05/21 per Dr. Kasandra Knudsen    Dr. Kasandra Knudsen echo 04/2020 EF 65-70% mild concentric LVH, mild diastolic dysfunction low risk myocardial perfusion scan 05/2020    Out of age window pap    mammo  07/22/21 normal ordered 2024   DEXA 01/31/22 osteopenia will rec vit D3 1000-2000 IU qd and calcium 600 mg bid or 1 tums    OSA on cpap since 2020 apap 9-13 cmH20   Rheumatology in [place] [facility] Dr. Bernadene Person [place]  [place] cards in Dr. Tamala Julian  Provider: Dr. Olivia Mackie [facility]

## 2022-02-23 NOTE — Telephone Encounter (Signed)
Natalie Watts from [facility] called stating they are going to decline the pt

## 2022-02-24 ENCOUNTER — Telehealth: Payer: Self-pay | Admitting: Internal Medicine

## 2022-02-24 NOTE — Telephone Encounter (Signed)
What is the reason pain clinic will decline?  Pt will need to call insurance and get name of another pain clinic to refer if so?

## 2022-02-24 NOTE — Telephone Encounter (Signed)
Pt would like a call regarding any information when she had her hip surgery in 2020. Please advise and Thank you!  Call pt @ [phone]

## 2022-02-25 NOTE — Telephone Encounter (Signed)
I [place] and ask her to ask her daughter for this info we do not have this

## 2022-02-27 NOTE — Telephone Encounter (Signed)
Inform pt she will need to contact insurance and see which pain clinics in her network [place] [facility] [place] [facility] declined her  She can ask insurance if Dr. Vashti Hey is in her network if so I will refer if not I need a list from her before I can refer asap if she can call and get back with Natalie Watts please

## 2022-02-28 ENCOUNTER — Telehealth: Payer: Self-pay

## 2022-02-28 NOTE — Telephone Encounter (Signed)
Spoke w/pt in regards to advice from Dr. Olivia Mackie:  McLean-Scocuzza, Nino Glow, MD  You 18 hours ago (9:44 PM)   TM Inform pt she will need to contact insurance and see which pain clinics in her network [place] [facility] [place] [facility] declined her  She can ask insurance if Dr. Vashti Hey is in her network if so I will refer if not I need a list from her before I can refer asap if she can call and get back with [place] please         Note    Pt has verbalized understanding.

## 2022-03-02 ENCOUNTER — Encounter: Payer: Self-pay | Admitting: Internal Medicine

## 2022-03-03 DIAGNOSIS — H903 Sensorineural hearing loss, bilateral: Secondary | ICD-10-CM | POA: Diagnosis not present

## 2022-03-03 DIAGNOSIS — G4733 Obstructive sleep apnea (adult) (pediatric): Secondary | ICD-10-CM | POA: Diagnosis not present

## 2022-03-07 ENCOUNTER — Ambulatory Visit
Admission: RE | Admit: 2022-03-07 | Discharge: 2022-03-07 | Disposition: A | Discharge status: Home / Self Care | Payer: Medicare PPO | Source: Ambulatory Visit | Attending: Internal Medicine | Admitting: Internal Medicine

## 2022-03-07 ENCOUNTER — Telehealth: Payer: Self-pay | Admitting: Internal Medicine

## 2022-03-07 DIAGNOSIS — R918 Other nonspecific abnormal finding of lung field: Secondary | ICD-10-CM | POA: Diagnosis not present

## 2022-03-07 DIAGNOSIS — R911 Solitary pulmonary nodule: Secondary | ICD-10-CM | POA: Insufficient documentation

## 2022-03-07 DIAGNOSIS — J479 Bronchiectasis, uncomplicated: Secondary | ICD-10-CM | POA: Diagnosis not present

## 2022-03-07 DIAGNOSIS — J439 Emphysema, unspecified: Secondary | ICD-10-CM | POA: Diagnosis not present

## 2022-03-07 NOTE — Addendum Note (Signed)
Addended by: Orland Mustard on: 03/07/2022 09:08 PM   Modules accepted: Orders

## 2022-03-07 NOTE — Telephone Encounter (Signed)
Does pt qualify for personal care services and can you call and inform/explain to daughter please Natalie Watts  She wants to continue home PT/OT/aid and nurse order in   Thank you

## 2022-03-07 NOTE — Telephone Encounter (Signed)
Hi again with PAD trying to clarify should she be taking aspirin, plavix and xarelto?    Cardiology rec I ask vascular   Can you office reach out to patient to clarify and update the medication list please    [facility]-Scocuzza

## 2022-03-09 NOTE — Telephone Encounter (Signed)
Do I need to fill out PCS form?

## 2022-03-11 ENCOUNTER — Encounter (HOSPITAL_COMMUNITY): Payer: Self-pay | Admitting: Emergency Medicine

## 2022-03-11 ENCOUNTER — Telehealth: Payer: Self-pay | Admitting: Internal Medicine

## 2022-03-11 ENCOUNTER — Encounter: Payer: Self-pay | Admitting: Internal Medicine

## 2022-03-11 ENCOUNTER — Other Ambulatory Visit: Payer: Self-pay

## 2022-03-11 ENCOUNTER — Emergency Department (HOSPITAL_COMMUNITY): Payer: Medicare PPO

## 2022-03-11 ENCOUNTER — Emergency Department (HOSPITAL_COMMUNITY)
Admission: EM | Admit: 2022-03-11 | Discharge: 2022-03-11 | Disposition: A | Discharge status: Home / Self Care | Payer: Medicare PPO | Attending: Emergency Medicine | Admitting: Emergency Medicine

## 2022-03-11 DIAGNOSIS — Z7982 Long term (current) use of aspirin: Secondary | ICD-10-CM | POA: Diagnosis not present

## 2022-03-11 DIAGNOSIS — W1839XA Other fall on same level, initial encounter: Secondary | ICD-10-CM | POA: Diagnosis not present

## 2022-03-11 DIAGNOSIS — S0990XA Unspecified injury of head, initial encounter: Secondary | ICD-10-CM | POA: Diagnosis not present

## 2022-03-11 DIAGNOSIS — R531 Weakness: Secondary | ICD-10-CM | POA: Diagnosis not present

## 2022-03-11 DIAGNOSIS — Z043 Encounter for examination and observation following other accident: Secondary | ICD-10-CM | POA: Diagnosis not present

## 2022-03-11 DIAGNOSIS — Z7901 Long term (current) use of anticoagulants: Secondary | ICD-10-CM | POA: Insufficient documentation

## 2022-03-11 DIAGNOSIS — M47814 Spondylosis without myelopathy or radiculopathy, thoracic region: Secondary | ICD-10-CM | POA: Diagnosis not present

## 2022-03-11 DIAGNOSIS — Y9301 Activity, walking, marching and hiking: Secondary | ICD-10-CM | POA: Diagnosis not present

## 2022-03-11 DIAGNOSIS — Z7902 Long term (current) use of antithrombotics/antiplatelets: Secondary | ICD-10-CM | POA: Insufficient documentation

## 2022-03-11 LAB — CBC WITH DIFFERENTIAL/PLATELET
Abs Immature Granulocytes: 0.02 10*3/uL (ref 0.00–0.07)
Basophils Absolute: 0 10*3/uL (ref 0.0–0.1)
Basophils Relative: 0 %
Eosinophils Absolute: 0.1 10*3/uL (ref 0.0–0.5)
Eosinophils Relative: 2 %
HCT: 38.8 % (ref 36.0–46.0)
Hemoglobin: 12.2 g/dL (ref 12.0–15.0)
Immature Granulocytes: 0 %
Lymphocytes Relative: 49 %
Lymphs Abs: 3 10*3/uL (ref 0.7–4.0)
MCH: 28.7 pg (ref 26.0–34.0)
MCHC: 31.4 g/dL (ref 30.0–36.0)
MCV: 91.3 fL (ref 80.0–100.0)
Monocytes Absolute: 0.5 10*3/uL (ref 0.1–1.0)
Monocytes Relative: 8 %
Neutro Abs: 2.5 10*3/uL (ref 1.7–7.7)
Neutrophils Relative %: 41 %
Platelets: 133 10*3/uL — ABNORMAL LOW (ref 150–400)
RBC: 4.25 MIL/uL (ref 3.87–5.11)
RDW: 14.2 % (ref 11.5–15.5)
WBC: 6.1 10*3/uL (ref 4.0–10.5)
nRBC: 0 % (ref 0.0–0.2)

## 2022-03-11 LAB — COMPREHENSIVE METABOLIC PANEL
ALT: 20 U/L (ref 0–44)
AST: 25 U/L (ref 15–41)
Albumin: 3.8 g/dL (ref 3.5–5.0)
Alkaline Phosphatase: 70 U/L (ref 38–126)
Anion gap: 11 (ref 5–15)
BUN: 18 mg/dL (ref 8–23)
CO2: 28 mmol/L (ref 22–32)
Calcium: 10 mg/dL (ref 8.9–10.3)
Chloride: 103 mmol/L (ref 98–111)
Creatinine, Ser: 1.02 mg/dL — ABNORMAL HIGH (ref 0.44–1.00)
GFR, Estimated: 57 mL/min — ABNORMAL LOW (ref >60)
Glucose, Bld: 181 mg/dL — ABNORMAL HIGH (ref 70–99)
Potassium: 4 mmol/L (ref 3.5–5.1)
Sodium: 142 mmol/L (ref 135–145)
Total Bilirubin: 0.4 mg/dL (ref 0.3–1.2)
Total Protein: 6.9 g/dL (ref 6.5–8.1)

## 2022-03-11 LAB — URINALYSIS, ROUTINE W REFLEX MICROSCOPIC
Bacteria, UA: NONE SEEN
Bilirubin Urine: NEGATIVE
Glucose, UA: NEGATIVE mg/dL
Hgb urine dipstick: NEGATIVE
Ketones, ur: NEGATIVE mg/dL
Leukocytes,Ua: NEGATIVE
Nitrite: NEGATIVE
Protein, ur: 30 mg/dL — AB
Specific Gravity, Urine: 1.02 (ref 1.005–1.030)
pH: 5 (ref 5.0–8.0)

## 2022-03-11 LAB — TROPONIN I (HIGH SENSITIVITY): Troponin I (High Sensitivity): 16 ng/L (ref <18)

## 2022-03-11 NOTE — ED Notes (Signed)
Patient transported to CT 

## 2022-03-11 NOTE — ED Provider Notes (Signed)
[facility] EMERGENCY DEPARTMENT Provider Note   CSN: [id number] Arrival date & time: 03/11/22  0116     History  Chief Complaint  Patient presents with   Natalie Watts is a 76 y.o. female.  76 year old female who presents the ER today with persistent weakness.  He is with her daughter.  She did not really have a real fall today she just got real weak kind of slumped over the side of a recliner.  Did not hit her head.  Daughter states over the last month she is progressively gotten more weak and has been more time to sit down.  She uses a walker generally.  No urinary symptoms.  No sided symptoms.   Fall       Home Medications Prior to Admission medications   Medication Sig Start Date End Date Taking? Authorizing Provider  acetaminophen (TYLENOL) 500 MG tablet Take 1,000 mg by mouth 2 (two) times daily as needed for moderate pain or mild pain. Rapid release    [provider]  amLODipine (NORVASC) 5 MG tablet Take 1 tablet (5 mg total) by mouth daily. 02/22/22   McLean-Scocuzza, Nino Glow, MD  aspirin EC 81 MG tablet Take 1 tablet (81 mg total) by mouth daily. Swallow whole. 06/01/21 06/01/22  Serafina Mitchell, MD  B-D UF III MINI PEN NEEDLES 31G X 5 MM MISC Inject 1 each into the skin 2 (two) times daily. Or preferred choice of the patient 07/23/21   McLean-Scocuzza, Nino Glow, MD  Blood Glucose Monitoring Suppl (ONE TOUCH ULTRA 2) w/Device KIT Used to check blood sugars daily. 05/08/18   McLean-Scocuzza, Nino Glow, MD  Cholecalciferol (VITAMIN D3) 50 MCG (2000 UT) capsule Take 2,000 Units by mouth daily.    [provider]  clobetasol cream (TEMOVATE) 4.19 % Apply 1 Application topically 2 (two) times daily. Legs and hands prn 02/22/22   McLean-Scocuzza, Nino Glow, MD  clopidogrel (PLAVIX) 75 MG tablet TAKE 1 TABLET BY MOUTH EVERY DAY 11/17/21   McLean-Scocuzza, Nino Glow, MD  Continuous Blood Gluc Receiver (FREESTYLE LIBRE 2 READER) DEVI 1 Device by  Does not apply route every 14 (fourteen) days. 07/23/21   McLean-Scocuzza, Nino Glow, MD  Dulaglutide (TRULICITY) 1.5 QQ/2.2LN SOPN Inject 1.5 mg into the skin once a week. D/c 0.75 02/22/22   McLean-Scocuzza, Nino Glow, MD  DULoxetine (CYMBALTA) 30 MG capsule Take 1 capsule (30 mg total) by mouth daily. 02/22/22   McLean-Scocuzza, Nino Glow, MD  ezetimibe (ZETIA) 10 MG tablet Take 1 tablet (10 mg total) by mouth daily. 02/22/22   McLean-Scocuzza, Nino Glow, MD  Lancets Ultra Fine MISC 1 Device by Does not apply route 2 (two) times daily as needed. Preferred for her device 07/23/21   McLean-Scocuzza, Nino Glow, MD  LANTUS SOLOSTAR 100 UNIT/ML Solostar Pen Inject 40 Units into the skin daily. 02/22/22   McLean-Scocuzza, Nino Glow, MD  leflunomide (ARAVA) 20 MG tablet Take 20 mg by mouth daily. rheumatology    [provider]  losartan (COZAAR) 100 MG tablet Take 1 tablet (100 mg total) by mouth daily. 02/22/22   McLean-Scocuzza, Nino Glow, MD  nebivolol (BYSTOLIC) 5 MG tablet Take 1 tablet (5 mg total) by mouth daily. Dc tenoretic 02/22/22   McLean-Scocuzza, Nino Glow, MD  potassium chloride SA (KLOR-CON M) 20 MEQ tablet Take 1 tablet (20 mEq total) by mouth every other day. 02/22/22   McLean-Scocuzza, Nino Glow, MD  pravastatin (PRAVACHOL) 40 MG tablet  Take 1 tablet (40 mg total) by mouth daily. 02/22/22   McLean-Scocuzza, Nino Glow, MD  XARELTO 2.5 MG TABS tablet Take 1 tablet (2.5 mg total) by mouth 2 (two) times daily. 12/29/21   Belva Crome, MD      Allergies    Lisinopril, Sulfamethoxazole-trimethoprim, Dapagliflozin, Gabapentin, Glipizide, Metformin, Metformin and related, Penicillin g, Penicillins, and Tramadol    Review of Systems   Review of Systems  Physical Exam Updated Vital Signs BP 132/69   Pulse 68   Temp 98.7 F (37.1 C) (Oral)   Resp 15   SpO2 94%  Physical Exam Vitals and nursing note reviewed.  Constitutional:      Appearance: She is well-developed.  HENT:     Head: Normocephalic and  atraumatic.  Eyes:     Pupils: Pupils are equal, round, and reactive to light.  Cardiovascular:     Rate and Rhythm: Normal rate and regular rhythm.  Pulmonary:     Effort: No respiratory distress.     Breath sounds: No stridor.  Abdominal:     General: Abdomen is flat. There is no distension.  Musculoskeletal:     Cervical back: Normal range of motion.  Skin:    General: Skin is warm and dry.  Neurological:     General: No focal deficit present.     Mental Status: She is alert.     ED Results / Procedures / Treatments   Labs (all labs ordered are listed, but only abnormal results are displayed) Labs Reviewed  URINALYSIS, ROUTINE W REFLEX MICROSCOPIC - Abnormal; Notable for the following components:      Result Value   Protein, ur 30 (*)    All other components within normal limits  CBC WITH DIFFERENTIAL/PLATELET - Abnormal; Notable for the following components:   Platelets 133 (*)    All other components within normal limits  COMPREHENSIVE METABOLIC PANEL - Abnormal; Notable for the following components:   Glucose, Bld 181 (*)    Creatinine, Ser 1.02 (*)    GFR, Estimated 57 (*)    All other components within normal limits  TROPONIN I (HIGH SENSITIVITY)    EKG EKG Interpretation  Date/Time:  Friday March 11 2022 01:49:08 EDT Ventricular Rate:  71 PR Interval:  150 QRS Duration: 88 QT Interval:  399 QTC Calculation: 434 R Axis:   124 Text Interpretation: Right and left arm electrode reversal, interpretation assumes no reversal Sinus rhythm Right axis deviation Nonspecific T abnormalities, lateral leads Confirmed by Merrily Pew [id number]) on 03/11/2022 3:47:37 AM  Radiology CT Head Wo Contrast  Result Date: 03/11/2022 CLINICAL DATA:  Status post fall. EXAM: CT HEAD WITHOUT CONTRAST TECHNIQUE: Contiguous axial images were obtained from the base of the skull through the vertex without intravenous contrast. RADIATION DOSE REDUCTION: This exam was performed  according to the departmental dose-optimization program which includes automated exposure control, adjustment of the mA and/or kV according to patient size and/or use of iterative reconstruction technique. COMPARISON:  None Available. FINDINGS: Brain: There is moderate severity cerebral atrophy with widening of the extra-axial spaces and ventricular dilatation. There are areas of decreased attenuation within the white matter tracts of the supratentorial brain, consistent with microvascular disease changes. Vascular: No hyperdense vessel or unexpected calcification. Skull: Normal. Negative for fracture or focal lesion. Sinuses/Orbits: No acute finding. Other: None. IMPRESSION: 1. No acute intracranial abnormality. 2. Generalized cerebral atrophy and microvascular disease changes of the supratentorial brain. Electronically Signed   By: Joyce Gross.D.  On: 03/11/2022 03:13   DG Chest 2 View  Result Date: 03/11/2022 CLINICAL DATA:  Recent fall, initial encounter EXAM: CHEST - 2 VIEW COMPARISON:  None Available. FINDINGS: Cardiac shadow is within normal limits. Aortic calcifications are seen. The lungs are clear. Degenerative changes of the thoracic spine are noted. IMPRESSION: No active cardiopulmonary disease. Electronically Signed   By: Inez Catalina M.D.   On: 03/11/2022 02:41    Procedures Procedures    Medications Ordered in ED Medications - No data to display  ED Course/ Medical Decision Making/ A&P                           Medical Decision Making Amount and/or Complexity of Data Reviewed Labs: ordered. Radiology: ordered.   No obvious causes for progressively worsening weakness.  Head ct viewed by myself and no obvious bleed, stroke or other causes. Labs reviewed without significant alterations to explain generalized weakness. Possibly just deconditioning 2/2 decreased mobility from her OA and RA. She and daughter will continue following up with Dr. For the same.   Final Clinical  Impression(s) / ED Diagnoses Final diagnoses:  Weakness    Rx / DC Orders ED Discharge Orders     None         Reanna Scoggin, Corene Cornea, MD 03/11/22 [phone]

## 2022-03-11 NOTE — ED Triage Notes (Signed)
Pt reported to ED for evaluation after fall this evening. Pt states she was walking from bathroom and attempting to sit in chair, does not remember falling but remembers waking up on floor with arms leaning against the chair. Reports  recent frequent falls. Takes Xarelto. Denies any pain to head upon palpation, on obvious signs of trauma to head noted at time of triage.

## 2022-03-11 NOTE — ED Notes (Signed)
Patient transported to X-ray 

## 2022-03-11 NOTE — Telephone Encounter (Signed)
It is easier to go to a skilled nursing facility from the hospital than from home a social worker or case manager can coordinate this from the hospital or emergency room   Can you place order for this please?

## 2022-03-11 NOTE — ED Notes (Signed)
Pt able to stand and pivot to bedside commode

## 2022-03-14 DIAGNOSIS — M170 Bilateral primary osteoarthritis of knee: Secondary | ICD-10-CM | POA: Diagnosis not present

## 2022-03-17 ENCOUNTER — Encounter: Payer: Self-pay | Admitting: Podiatry

## 2022-03-17 ENCOUNTER — Encounter: Payer: Self-pay | Admitting: Internal Medicine

## 2022-03-17 ENCOUNTER — Ambulatory Visit: Payer: Medicare PPO | Admitting: Podiatry

## 2022-03-17 DIAGNOSIS — E0843 Diabetes mellitus due to underlying condition with diabetic autonomic (poly)neuropathy: Secondary | ICD-10-CM | POA: Diagnosis not present

## 2022-03-17 DIAGNOSIS — B351 Tinea unguium: Secondary | ICD-10-CM | POA: Diagnosis not present

## 2022-03-17 DIAGNOSIS — J439 Emphysema, unspecified: Secondary | ICD-10-CM | POA: Insufficient documentation

## 2022-03-17 DIAGNOSIS — M79675 Pain in left toe(s): Secondary | ICD-10-CM

## 2022-03-17 DIAGNOSIS — M47814 Spondylosis without myelopathy or radiculopathy, thoracic region: Secondary | ICD-10-CM

## 2022-03-17 DIAGNOSIS — M79674 Pain in right toe(s): Secondary | ICD-10-CM

## 2022-03-17 DIAGNOSIS — R918 Other nonspecific abnormal finding of lung field: Secondary | ICD-10-CM | POA: Insufficient documentation

## 2022-03-17 DIAGNOSIS — J479 Bronchiectasis, uncomplicated: Secondary | ICD-10-CM

## 2022-03-17 HISTORY — DX: Bronchiectasis, uncomplicated: J47.9

## 2022-03-17 HISTORY — DX: Spondylosis without myelopathy or radiculopathy, thoracic region: M47.814

## 2022-03-17 NOTE — Progress Notes (Signed)
This patient returns to my office for at risk foot care.  This patient requires this care by a professional since this patient will be at risk due to having DM, Coagulation defect and kidney disease.  This patient is unable to cut nails herself since the patient cannot reach her nails.These nails are painful walking and wearing shoes.  This patient presents for at risk foot care today.  General Appearance  Alert, conversant and in no acute stress.  Vascular  Dorsalis pedis and posterior tibial  pulses are palpable  bilaterally.  Capillary return is within normal limits  bilaterally. Temperature is within normal limits  bilaterally.  Neurologic  Senn-Weinstein monofilament wire test within normal limits  bilaterally. Muscle power within normal limits bilaterally.  Nails Thick disfigured discolored nails with subungual debris  from hallux to fifth toes bilaterally. No evidence of bacterial infection or drainage bilaterally.  Orthopedic  No limitations of motion  feet .  No crepitus or effusions noted.  No bony pathology or digital deformities noted.  Skin  normotropic skin with no porokeratosis noted bilaterally.  No signs of infections or ulcers noted.     Onychomycosis  Pain in right toes  Pain in left toes  Consent was obtained for treatment procedures.   Mechanical debridement of nails 1-5  bilaterally performed with a nail nipper.  Filed with dremel without incident.    Return office visit    4 months                  Told patient to return for periodic foot care and evaluation due to potential at risk complications.   Gardiner Barefoot DPM

## 2022-03-18 ENCOUNTER — Telehealth: Payer: Self-pay

## 2022-03-18 NOTE — Telephone Encounter (Signed)
Patient returned office phone for CT results.

## 2022-03-18 NOTE — Telephone Encounter (Signed)
Patient states she is returning Gracy Racer, CMA's call.  I transferred call to [facility].

## 2022-03-18 NOTE — Telephone Encounter (Signed)
Called pt back left another msg

## 2022-03-18 NOTE — Telephone Encounter (Signed)
LMOM for pt to CB in regards to CT results

## 2022-03-20 DIAGNOSIS — R9389 Abnormal findings on diagnostic imaging of other specified body structures: Secondary | ICD-10-CM | POA: Insufficient documentation

## 2022-03-20 HISTORY — DX: Abnormal findings on diagnostic imaging of other specified body structures: R93.89

## 2022-03-20 NOTE — Addendum Note (Signed)
Addended by: Orland Mustard on: 03/20/2022 12:18 PM   Modules accepted: Orders

## 2022-03-25 DIAGNOSIS — E0829 Diabetes mellitus due to underlying condition with other diabetic kidney complication: Secondary | ICD-10-CM | POA: Diagnosis not present

## 2022-03-25 DIAGNOSIS — N183 Chronic kidney disease, stage 3 unspecified: Secondary | ICD-10-CM | POA: Diagnosis not present

## 2022-03-28 DIAGNOSIS — R296 Repeated falls: Secondary | ICD-10-CM | POA: Diagnosis not present

## 2022-03-28 DIAGNOSIS — Z7985 Long-term (current) use of injectable non-insulin antidiabetic drugs: Secondary | ICD-10-CM | POA: Diagnosis not present

## 2022-03-28 DIAGNOSIS — I129 Hypertensive chronic kidney disease with stage 1 through stage 4 chronic kidney disease, or unspecified chronic kidney disease: Secondary | ICD-10-CM | POA: Diagnosis not present

## 2022-03-28 DIAGNOSIS — Z794 Long term (current) use of insulin: Secondary | ICD-10-CM | POA: Diagnosis not present

## 2022-03-28 DIAGNOSIS — N1832 Chronic kidney disease, stage 3b: Secondary | ICD-10-CM | POA: Diagnosis not present

## 2022-03-28 DIAGNOSIS — R2689 Other abnormalities of gait and mobility: Secondary | ICD-10-CM | POA: Diagnosis not present

## 2022-03-28 DIAGNOSIS — M069 Rheumatoid arthritis, unspecified: Secondary | ICD-10-CM | POA: Diagnosis not present

## 2022-03-28 DIAGNOSIS — E1122 Type 2 diabetes mellitus with diabetic chronic kidney disease: Secondary | ICD-10-CM | POA: Diagnosis not present

## 2022-03-28 DIAGNOSIS — G8929 Other chronic pain: Secondary | ICD-10-CM | POA: Diagnosis not present

## 2022-03-29 ENCOUNTER — Other Ambulatory Visit: Payer: Self-pay

## 2022-03-29 ENCOUNTER — Encounter: Payer: Self-pay | Admitting: Neurosurgery

## 2022-03-29 ENCOUNTER — Ambulatory Visit: Payer: Medicare PPO | Admitting: Neurosurgery

## 2022-03-29 VITALS — BP 136/78 | Ht 61.0 in | Wt 146.0 lb

## 2022-03-29 DIAGNOSIS — M48062 Spinal stenosis, lumbar region with neurogenic claudication: Secondary | ICD-10-CM | POA: Diagnosis not present

## 2022-03-29 DIAGNOSIS — M4714 Other spondylosis with myelopathy, thoracic region: Secondary | ICD-10-CM | POA: Diagnosis not present

## 2022-03-29 NOTE — Progress Notes (Signed)
error 

## 2022-03-29 NOTE — Progress Notes (Signed)
Referring Physician:  Thornton Park, MD [place],  Natalie Watts [id number]  Primary Physician:  Natalie Watts, Natalie Glow, MD  History of Present Illness: 03/29/2022 [name] is here today with a chief complaint of low back and leg discomfort in addition to imbalance as noted below.  She has had slow increase in her symptoms over the last several months.  11/04/2021 [name] is here today with a chief complaint of low back pain that radiates into the left thigh for many years. She also has right foot drop that started after her hip surgery in 2020. She has also noticed some problems with her gait and balance and weakness in the legs.   She is diabetic and on blood thinners. She has significant vascular disease.  She has had problems for many years but worsening recently. She is having trouble with walking. She is very unbalanced. She has trouble going from sitting to standing or from standing to sitting. She is walking with a walker and is still unsteady with that.  She has been worsening over time. Bowel/Bladder Dysfunction: none  Conservative measures:  Physical therapy: has participated in at [facility] and finished in March. Multimodal medical therapy including regular antiinflammatories: tylenol, cyclobenzaprine, gabapentin, meloxicam, percocet Injections: has not received epidural steroid injections  Past Surgery: cervical laminoplasty and fusion for myelopathy by Dr Natalie Watts in 2015 or 2016  Natalie Watts has clear symptoms of cervical myelopathy.  The symptoms are causing a significant impact on the patient's life.   Review of Systems:  A 10 point review of systems is negative, except for the pertinent positives and negatives detailed in the HPI.  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 Loann Watts Watts    COVID 19+    mid 11/2020   COVID-19    01/17/22   Degenerative disk disease    Diabetes ([place])    DVT (deep venous thrombosis) ([place])    DVT, lower extremity ([place])    left, 05/2013 on Xarelto x 1 month etiology unkown    GERD (gastroesophageal reflux disease)    Gout    H/O myocardial perfusion scan    06/08/20 low risk   Hammer toes, bilateral    HSV-2 infection    Hyperlipidemia    Hypertension    Insomnia    Intertrigo    Lumbar radiculopathy    improved after steroid inj and PT 10/21/20 had right L4/5 L5-S1 transforaminal epidural steroid injections   Onychomycosis    OSA on CPAP    dx in 2020 after hip surgery CPAP 9-13 cm H20 supplies [employer] PSG 03/18/2019 severe OSA AHI 42.1/hr titration 03/27/2019 rec APAP 9-13 cm H20   PAD (peripheral artery disease) ([place])    1 x right, 3 x left in 2020-2022 needs bypass on leg had  CTA with run off 11/2020 in [place]   PAD (peripheral artery disease) (HCC)    with stents   Peripheral vascular disease ([place])    Right foot drop    since 2018/09/26   Subdeltoid bursitis    Tinea pedis    Vitamin D deficiency     Past Surgical History: Past Surgical History:  Procedure Laterality Date   ABDOMINAL AORTOGRAM W/LOWER EXTREMITY N/A 06/01/2021   Procedure: ABDOMINAL AORTOGRAM W/LOWER EXTREMITY;  Surgeon: Natalie Mitchell, MD;  Location: [place] [facility];  Service: Cardiovascular;  Laterality: N/A;   BACK SURGERY     cervical laminoplasty C3-C7 Rex Dr. Sheppard Watts fusion/decompression 2/2 myelopathy 03/2014 or 02/2015   COLON SURGERY      colectomy in 09/25/2013   COLONOSCOPY  05/01/2020   [place] [facility] Dr. Edwena Bunde Natalie Watts 12 mm d colon polyp, grade 1 IH, moderate diverticulosis tubular   ESOPHAGOGASTRODUODENOSCOPY  05/01/2020   erosive gastritis Dr. Tilford Pillar [place]   PERIPHERAL VASCULAR INTERVENTION  06/01/2021   Procedure: PERIPHERAL VASCULAR INTERVENTION;  Surgeon: Natalie Mitchell, MD;  Location: [place] CV LAB;  Service: Cardiovascular;;  RT. SFA   right total hip Right    03/05/2019 or 02/26/2020   TUBAL LIGATION     UPPER GI ENDOSCOPY     05/01/20   VASCULAR SURGERY     08/19/19   VASCULAR SURGERY     07/05/19   VASCULAR SURGERY     05/21/2019 angioplasty since 05/2019, 06/2019 and 2019/09/26 had 2 left leg and 1 on right leg    Allergies: Allergies as of 03/29/2022 - Review Complete 03/17/2022  Allergen Reaction Noted   Lisinopril Anaphylaxis, Swelling, and Other (See Comments) 08/31/2014   Sulfamethoxazole-trimethoprim Other (See Comments) 12/04/2014   Dapagliflozin Diarrhea 05/17/2021   Gabapentin  10/13/2021   Glipizide Diarrhea 05/17/2021   Metformin Diarrhea and Other (See Comments) 03/11/2022   Metformin and related  06/14/2017   Penicillin g Other (See Comments) 03/11/2022   Penicillins     Tramadol Nausea Only and Nausea And Vomiting 05/20/2013    Medications: No outpatient medications have been marked as taking for the 03/29/22 encounter (Appointment) with Natalie Maw, MD.    Social History: Social History   Tobacco Use   Smoking status: Former    Packs/day: 1.00    Years: 45.00    Total pack years: 45.00    Types: Cigarettes    Quit date: 12/18/2012    Years since quitting: 9.2    Passive exposure: Never   Smokeless tobacco: Never  Vaping Use   Vaping Use: Never used  Substance Use Topics   Alcohol use: No   Drug use: Yes    Frequency: 3.0 times per week    Family Medical History: Family History  Problem Relation Age of Onset   Hypertension  Mother    Arthritis Mother    Cancer Father        colon cancer dx'ed died age 8   Hypertension Sister    Arthritis Sister    Cancer Sister        died in 44s brain tumor h/o lung cancer smoker died 09-26-2015    Early death Sister    Breast cancer Maternal Grandmother    Cancer Maternal Grandmother        breast dx older age    Hypertension Maternal Grandmother    Breast cancer Other        maternal neice  Cancer Other        died in 76s    Hypertension Daughter    Hypertension Daughter     Physical Examination: There were no vitals filed for this visit.  General: Patient is well developed, well nourished, calm, collected, and in no apparent distress. Attention to examination is appropriate.  Neck:   Supple.  Full range of motion.  Respiratory: Patient is breathing without any difficulty.   NEUROLOGICAL:     Awake, alert, oriented to person, place, and time.  Speech is clear and fluent. Fund of knowledge is appropriate.   Cranial Nerves: Pupils equal round and reactive to light.  Facial tone is symmetric.  Facial sensation is symmetric. Shoulder shrug is symmetric. Tongue protrusion is midline.  There is no pronator drift.  ROM of spine: full.    Strength: Side Biceps Triceps Deltoid Interossei Grip Wrist Ext. Wrist Flex.  R '5 5 5 5 5 5 5  '$ L '5 5 5 5 5 5 5   '$ Side Iliopsoas Quads Hamstring PF DF EHL  R 4 4+ 4+ 4+ 4+ 4+  L 4 4+ 4+ 4+ 4+ 4+   Reflexes are 2+ and symmetric at the biceps, triceps, brachioradialis, patella and achilles.   Hoffman's is absent.   Bilateral upper and lower extremity sensation is symmetric to light touch.    No evidence of dysmetria noted.  Gait is abnormal and unsteady - walks with walker.     Medical Decision Making  Imaging: MRI L spine 10/13/2021 IMPRESSION:  1. Cord hyperintensity at T11-12 due to moderate spinal stenosis.  Severe foraminal encroachment bilaterally.  2. Moderate to severe spinal stenosis L2-3. Moderate to severe   spinal stenosis at L3-4.  3. Moderate spinal stenosis L4-5  4. Severe foraminal encroachment bilaterally L5-S1.  5. These results will be called to the ordering clinician or  representative by the Radiologist Assistant, and communication  documented in the PACS or [employer].   Electronically Signed    By: Franchot Gallo M.D.    On: 10/13/2021 18:28  MRI CT spine 11/25/21 IMPRESSION: CERVICAL SPINE MRI:   Prior decompression and fusion from C3-C7 with solid arthrodesis and patent spinal canal at these levels.   Similar appearance of myelomalacia within the dorsal spinal cord at C3-C4 and at C5. No syrinx.   Multilevel moderate to severe neural foraminal stenoses as described above, similar to prior exam.   THORACIC SPINE MRI:   Mild spinal canal stenosis, moderate-severe right and moderate left neural foraminal stenosis at T1-T2.   Progressive disc bulge at T2-T3 with moderate to severe spinal canal stenosis and focal abnormal cord signal consistent with congestive myelopathy. Severe bilateral neural foraminal stenosis at this level.   No significant stenosis from T3 through T11.   Broad-based disc bulge with moderate-severe spinal canal stenosis at T11-T12 and focal abnormal cord signal consistent with congestive myelopathy. Severe left and moderate right neural foraminal stenosis at this level.     Electronically Signed   By: Maurine Simmering M.D.   On: 11/26/2021 15:56  I have personally reviewed the images and agree with the above interpretation.  Assessment and Plan: Natalie Watts is a pleasant 76 y.o. 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  She has had slow decline in symptoms.  She is currently doing occupational and physical therapy.  We again discussed that she has severe thoracic stenosis worse at T11-12.  She also has moderate stenosis at  T2-3.  She has severe lumbar stenosis from  L2-5.  Given her slow decline in function, we did discuss surgical intervention which would involve right-sided T11-12 transpedicular discectomy and fusion followed by L2-5 decompression several weeks later after she is recovered.  She would like to weigh her options.  An alternative option is continued physical and occupational therapy and watchful waiting.  She has multiple medical issues and may ultimately decide not to proceed.  She will let us know if she would like to move forward.   I spent a total of 30 minutes in face-to-face and non-face-to-face activities related to this patient's care today.  Thank you for involving me in the care of this patient.      Tayte Mcwherter K. Izora Ribas MD, [facility]

## 2022-04-01 ENCOUNTER — Telehealth: Payer: Self-pay

## 2022-04-01 NOTE — Telephone Encounter (Signed)
Spoke with pts daughter about the forms being available for pick up.   She verbalized understanding and forms have been placed in a yellow folder up front in designated area.

## 2022-04-04 ENCOUNTER — Institutional Professional Consult (permissible substitution): Payer: Medicare PPO | Admitting: Student in an Organized Health Care Education/Training Program

## 2022-04-06 DIAGNOSIS — G894 Chronic pain syndrome: Secondary | ICD-10-CM | POA: Diagnosis not present

## 2022-04-06 DIAGNOSIS — F4320 Adjustment disorder, unspecified: Secondary | ICD-10-CM | POA: Diagnosis not present

## 2022-04-06 DIAGNOSIS — F32A Depression, unspecified: Secondary | ICD-10-CM | POA: Diagnosis not present

## 2022-04-12 DIAGNOSIS — Z6828 Body mass index (BMI) 28.0-28.9, adult: Secondary | ICD-10-CM | POA: Diagnosis not present

## 2022-04-12 DIAGNOSIS — M1991 Primary osteoarthritis, unspecified site: Secondary | ICD-10-CM | POA: Diagnosis not present

## 2022-04-12 DIAGNOSIS — G8929 Other chronic pain: Secondary | ICD-10-CM | POA: Diagnosis not present

## 2022-04-12 DIAGNOSIS — M79641 Pain in right hand: Secondary | ICD-10-CM | POA: Diagnosis not present

## 2022-04-12 DIAGNOSIS — M0579 Rheumatoid arthritis with rheumatoid factor of multiple sites without organ or systems involvement: Secondary | ICD-10-CM | POA: Diagnosis not present

## 2022-04-12 DIAGNOSIS — M25562 Pain in left knee: Secondary | ICD-10-CM | POA: Diagnosis not present

## 2022-04-12 DIAGNOSIS — M79642 Pain in left hand: Secondary | ICD-10-CM | POA: Diagnosis not present

## 2022-04-12 DIAGNOSIS — E663 Overweight: Secondary | ICD-10-CM | POA: Diagnosis not present

## 2022-05-03 ENCOUNTER — Ambulatory Visit: Payer: Medicare PPO | Admitting: Student in an Organized Health Care Education/Training Program

## 2022-05-03 ENCOUNTER — Encounter: Payer: Self-pay | Admitting: Student in an Organized Health Care Education/Training Program

## 2022-05-03 VITALS — BP 130/70 | HR 114 | Temp 98.7°F | Ht 61.0 in | Wt 147.0 lb

## 2022-05-03 DIAGNOSIS — R918 Other nonspecific abnormal finding of lung field: Secondary | ICD-10-CM

## 2022-05-03 DIAGNOSIS — Z87891 Personal history of nicotine dependence: Secondary | ICD-10-CM | POA: Diagnosis not present

## 2022-05-03 NOTE — Progress Notes (Signed)
Synopsis: Referred in for pulmonary nodule by McLean-Scocuzza, Olivia Mackie *  Assessment & Plan:   #Pulmonary Nodules  The CT scan from September 2023 showed multiple tiny pulmonary nodules largest measuring 4 mm.  Given the size of these nodules, I do not recommend short-term follow-up and would recommend returning the patient to yearly low-dose CT scan of the chest for lung cancer screening.  The CT does note some bronchiectasis which on my review is quite minimal and I do not suspect as of any clinical significance at the moment.  There is some very mild subpleural reticulation which is of unclear clinical significance. I will recommend the patient continue with low dose CT scans of the chest with PRN follow up with pulmonary should she require it.  #History of tobacco use  - Ambulatory Referral for Lung Cancer Scre  Return if symptoms worsen or fail to improve.  I spent 45 minutes caring for this patient today, including preparing to see the patient, obtaining and/or reviewing separately obtained history, performing a medically appropriate examination and/or evaluation, counseling and educating the patient/family/caregiver, ordering medications, tests, or procedures, and documenting clinical information in the electronic health record  Armando Reichert, MD [facility] 05/03/2022 3:53 PM    End of visit medications:  No orders of the defined types were placed in this encounter.    Current Outpatient Medications:    acetaminophen (TYLENOL) 500 MG tablet, Take 1,000 mg by mouth 2 (two) times daily as needed for moderate pain or mild pain. Rapid release, Disp: , Rfl:    amLODipine (NORVASC) 5 MG tablet, Take 1 tablet (5 mg total) by mouth daily., Disp: 90 tablet, Rfl: 3   aspirin EC 81 MG tablet, Take 1 tablet (81 mg total) by mouth daily. Swallow whole., Disp: 150 tablet, Rfl: 2   B-D UF III MINI PEN NEEDLES 31G X 5 MM MISC, Inject 1 each into the skin 2 (two) times  daily. Or preferred choice of the patient, Disp: 300 each, Rfl: 3   Blood Glucose Monitoring Suppl (ONE TOUCH ULTRA 2) w/Device KIT, Used to check blood sugars daily., Disp: 1 each, Rfl: 0   Cholecalciferol (VITAMIN D3) 50 MCG (2000 UT) capsule, Take 2,000 Units by mouth daily., Disp: , Rfl:    clobetasol cream (TEMOVATE) 3.25 %, Apply 1 Application topically 2 (two) times daily. Legs and hands prn, Disp: 30 g, Rfl: 5   clopidogrel (PLAVIX) 75 MG tablet, TAKE 1 TABLET BY MOUTH EVERY DAY, Disp: 90 tablet, Rfl: 1   Continuous Blood Gluc Receiver (FREESTYLE LIBRE 2 READER) DEVI, 1 Device by Does not apply route every 14 (fourteen) days., Disp: 1 each, Rfl: 0   Dulaglutide (TRULICITY) 1.5 QD/8.2ME SOPN, Inject 1.5 mg into the skin once a week. D/c 0.75, Disp: 3 mL, Rfl: 2   DULoxetine (CYMBALTA) 30 MG capsule, Take 1 capsule (30 mg total) by mouth daily., Disp: 90 capsule, Rfl: 3   ezetimibe (ZETIA) 10 MG tablet, Take 1 tablet (10 mg total) by mouth daily., Disp: 90 tablet, Rfl: 3   Lancets Ultra Fine MISC, 1 Device by Does not apply route 2 (two) times daily as needed. Preferred for her device, Disp: 180 each, Rfl: 3   LANTUS SOLOSTAR 100 UNIT/ML Solostar Pen, Inject 40 Units into the skin daily., Disp: 15 mL, Rfl: 11   leflunomide (ARAVA) 20 MG tablet, Take 20 mg by mouth daily. rheumatology, Disp: , Rfl:    losartan (COZAAR) 100 MG tablet, Take 1  tablet (100 mg total) by mouth daily., Disp: , Rfl:    nebivolol (BYSTOLIC) 5 MG tablet, Take 1 tablet (5 mg total) by mouth daily. Dc tenoretic, Disp: 90 tablet, Rfl: 3   potassium chloride SA (KLOR-CON M) 20 MEQ tablet, Take 1 tablet (20 mEq total) by mouth every other day., Disp: 45 tablet, Rfl: 3   pravastatin (PRAVACHOL) 40 MG tablet, Take 1 tablet (40 mg total) by mouth daily., Disp: 90 tablet, Rfl: 3   XARELTO 2.5 MG TABS tablet, Take 1 tablet (2.5 mg total) by mouth 2 (two) times daily., Disp: 60 tablet, Rfl: 11   Subjective:   PATIENT ID: Natalie Watts GENDER: female DOB: 1946-01-30, MRN: [id number]  Chief Complaint  Patient presents with   pulmonary consult    CT 03/07/2022-c/o SOB with exertion.     HPI  Ms. Muto is a pleasant 76 year old female with a history of rheumatoid arthritis who presents to clinic for the evaluation of pulmonary nodules.  Patient tells me that she is in her usual state of health and was seen by her primary care physician who ordered a CT scan of the chest presumably for lung cancer screening.  Patient was referred following the results of the CT scan.  She reports some mild exertional dyspnea but otherwise has no cough, wheezing, or sputum production.  She quit smoking in 2014 after having smoked for very long period of time.  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  She does not have any significant occupational exposures besides cigarette smoke.  Ancillary information including prior medications, full medical/surgical/family/social histories, and PFTs (when available) are listed below and have been reviewed.   Review of Systems  Constitutional:  Negative for chills and fever.  Respiratory:  Positive for shortness of breath. Negative for cough and hemoptysis.      Objective:   Vitals:   05/03/22 1522  BP: 130/70  Pulse: (!) 114  Temp: 98.7 F (37.1 C)  TempSrc: Temporal  SpO2: 97%  Weight: 147 lb (66.7 kg)  Height: _0  (1.549 m)   97% on RA  BMI Readings from Last 3 Encounters:  05/03/22 27.78 kg/m  03/29/22 27.59 kg/m  02/22/22 27.74 kg/m   Wt Readings from Last 3 Encounters:  05/03/22 147 lb (66.7 kg)  03/29/22 146 lb (66.2 kg)  02/22/22 146 lb 12.8 oz (66.6 kg)    Physical Exam Constitutional:      Appearance: Normal appearance.  HENT:     Head: Atraumatic.     Mouth/Throat:     Mouth: Mucous membranes are moist.  Eyes:     Pupils: Pupils are equal, round, and  reactive to light.  Cardiovascular:     Rate and Rhythm: Normal rate and regular rhythm.     Heart sounds: Normal heart sounds.  Pulmonary:     Effort: Pulmonary effort is normal.     Breath sounds: Normal breath sounds. No wheezing.  Abdominal:     General: Abdomen is flat.     Palpations: Abdomen is soft.  Neurological:     General: No focal deficit present.     Mental Status: She is alert and oriented to person, place, and time.       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 Loann Quill [facility]    COVID 19+    mid 11/2020   COVID-19    01/17/22   Degenerative disk disease    Diabetes ([place])    DVT (deep venous thrombosis) ([place])    DVT, lower extremity ([place])    left, 05/2013 on Xarelto x 1 month etiology unkown    GERD (gastroesophageal reflux disease)    Gout    H/O myocardial perfusion scan    06/08/20 low risk   Hammer toes, bilateral    HSV-2 infection    Hyperlipidemia    Hypertension    Insomnia    Intertrigo    Lumbar radiculopathy    improved after steroid inj and PT 10/21/20 had right L4/5 L5-S1 transforaminal epidural steroid injections   Onychomycosis    OSA on CPAP    dx in 2018-08-10 after hip surgery CPAP 9-13 cm H20 supplies [employer] PSG 03/18/2019 severe OSA AHI 42.1/hr titration 03/27/2019 rec APAP 9-13 cm H20   PAD (peripheral artery disease) ([place])    1 x right, 3 x left in 2020-2022 needs bypass on leg  had CTA with run off 11/2020 in [place]   PAD (peripheral artery disease) (HCC)    with stents   Peripheral vascular disease ([place])    Right foot drop    since 2018-08-10   Subdeltoid bursitis    Tinea pedis    Vitamin D deficiency      Family History  Problem Relation Age of Onset   Hypertension Mother    Arthritis Mother    Cancer Father        colon cancer dx'ed died age 63   Hypertension Sister    Arthritis Sister    Cancer Sister        died in 49s brain tumor h/o lung cancer smoker died 08-11-2015    Early death Sister    Breast cancer Maternal Grandmother    Cancer Maternal Grandmother        breast dx older age    Hypertension Maternal Grandmother    Breast cancer Other        maternal neice   Cancer Other        died in 35s    Hypertension Daughter    Hypertension Daughter      Past Surgical History:  Procedure Laterality Date   ABDOMINAL AORTOGRAM W/LOWER EXTREMITY N/A 06/01/2021   Procedure: ABDOMINAL AORTOGRAM W/LOWER EXTREMITY;  Surgeon: Serafina Mitchell, MD;  Location: [facility];  Service: Cardiovascular;  Laterality: N/A;   BACK SURGERY     cervical laminoplasty C3-C7 Rex Dr. Sheppard Evens fusion/decompression 2/2 myelopathy 03/2014 or 02/2015   COLON SURGERY     colectomy in 2013-08-10   COLONOSCOPY  05/01/2020   [place] Dr. Edwena Bunde [place] 12 mm d colon polyp, grade 1 IH, moderate diverticulosis tubular   ESOPHAGOGASTRODUODENOSCOPY  05/01/2020   erosive gastritis Dr. Langley [place]  PERIPHERAL VASCULAR INTERVENTION  06/01/2021   Procedure: PERIPHERAL VASCULAR INTERVENTION;  Surgeon: Serafina Mitchell, MD;  Location: [facility];  Service: Cardiovascular;;  RT. SFA   right total hip Right    03/05/2019 or 02/26/2020   TUBAL LIGATION     UPPER GI ENDOSCOPY     05/01/20   VASCULAR SURGERY     08/19/19   VASCULAR SURGERY     07/05/19   VASCULAR SURGERY     05/21/2019 angioplasty since 05/2019, 06/2019 and 08/2019 had 2 left leg and  1 on right leg    Social History   Socioeconomic History   Marital status: Divorced    Spouse name: Not on file   Number of children: Not on file   Years of education: Not on file   Highest education level: Not on file  Occupational History   Not on file  Tobacco Use   Smoking status: Former    Packs/day: 1.00    Years: 45.00    Total pack years: 45.00    Types: Cigarettes    Quit date: 12/18/2012    Years since quitting: 9.3    Passive exposure: Never   Smokeless tobacco: Never  Vaping Use   Vaping Use: Never used  Substance and Sexual Activity   Alcohol use: No   Drug use: Yes    Frequency: 3.0 times per week   Sexual activity: Not on file  Other Topics Concern   Not on file  Social History Narrative   Divorced    2 daughters [name] comes to most appts and is Chief Executive Officer    -lives with daughter [name]    Used to work for [employer] now [employer]    Former smoker age 31 to age 76/2012 quit for sure in 2014 max 1/2 ppd FH lung cancer sister also smoker    Social Determinants of Health   Financial Resource Strain: Low Risk  (01/02/2018)   Overall Financial Resource Strain (CARDIA)    Difficulty of Paying Living Expenses: Not hard at all  Food Insecurity: No Food Insecurity (09/22/2021)   Hunger Vital Sign    Worried About Running Out of Food in the Last Year: Never true    Ran Out of Food in the Last Year: Never true  Transportation Needs: No Transportation Needs (09/22/2021)   PRAPARE - Hydrologist (Medical): No    Lack of Transportation (Non-Medical): No  Physical Activity: Insufficiently Active (09/22/2021)   Exercise Vital Sign    Days of Exercise per Week: 1 day    Minutes of Exercise per Session: 60 min  Stress: No Stress Concern Present (09/22/2021)   [place]    Feeling of Stress : Not at all  Social Connections: Unknown (09/22/2021)   Social Connection and  Isolation Panel [NHANES]    Frequency of Communication with Friends and Family: More than three times a week    Frequency of Social Gatherings with Friends and Family: More than three times a week    Attends Religious Services: More than 4 times per year    Active Member of [employer] or Organizations: Not on file    Attends Archivist Meetings: More than 4 times per year    Marital Status: Not on file  Intimate Partner Violence: Not At Risk (09/22/2021)   Humiliation, Afraid, Rape, and Kick questionnaire    Fear of Current or Ex-Partner: No  Emotionally Abused: No    Physically Abused: No    Sexually Abused: No     Allergies  Allergen Reactions   Lisinopril Anaphylaxis, Swelling and Other (See Comments)     Tongue swelling    Sulfamethoxazole-Trimethoprim Other (See Comments)    Worked against diabetic medication   Dapagliflozin Diarrhea    diarrhea   Gabapentin    Glipizide Diarrhea    Diarrhea    Metformin Diarrhea and Other (See Comments)   Metformin And Related     GI sx's diarrhea   Penicillin G Other (See Comments)   Penicillins     Childhood   Tramadol Nausea Only and Nausea And Vomiting     CBC    Component Value Date/Time   WBC 6.1 03/11/2022 0206   RBC 4.25 03/11/2022 0206   HGB 12.2 03/11/2022 0206   HGB 14.3 09/12/2014 1013   HCT 38.8 03/11/2022 0206   HCT 44.3 09/12/2014 1013   PLT 133 (L) 03/11/2022 0206   PLT 187 09/12/2014 1013   MCV 91.3 03/11/2022 0206   MCV 86 09/12/2014 1013   MCH 28.7 03/11/2022 0206   MCHC 31.4 03/11/2022 0206   RDW 14.2 03/11/2022 0206   RDW 15.7 (H) 09/12/2014 1013   LYMPHSABS 3.0 03/11/2022 0206   MONOABS 0.5 03/11/2022 0206   EOSABS 0.1 03/11/2022 0206   BASOSABS 0.0 03/11/2022 0206    Pulmonary Functions Testing Results:     No data to display          Outpatient Medications Prior to Visit  Medication Sig Dispense Refill   acetaminophen (TYLENOL) 500 MG tablet Take 1,000 mg by mouth 2 (two) times  daily as needed for moderate pain or mild pain. Rapid release     amLODipine (NORVASC) 5 MG tablet Take 1 tablet (5 mg total) by mouth daily. 90 tablet 3   aspirin EC 81 MG tablet Take 1 tablet (81 mg total) by mouth daily. Swallow whole. 150 tablet 2   B-D UF III MINI PEN NEEDLES 31G X 5 MM MISC Inject 1 each into the skin 2 (two) times daily. Or preferred choice of the patient 300 each 3   Blood Glucose Monitoring Suppl (ONE TOUCH ULTRA 2) w/Device KIT Used to check blood sugars daily. 1 each 0   Cholecalciferol (VITAMIN D3) 50 MCG (2000 UT) capsule Take 2,000 Units by mouth daily.     clobetasol cream (TEMOVATE) 1.44 % Apply 1 Application topically 2 (two) times daily. Legs and hands prn 30 g 5   clopidogrel (PLAVIX) 75 MG tablet TAKE 1 TABLET BY MOUTH EVERY DAY 90 tablet 1   Continuous Blood Gluc Receiver (FREESTYLE LIBRE 2 READER) DEVI 1 Device by Does not apply route every 14 (fourteen) days. 1 each 0   Dulaglutide (TRULICITY) 1.5 RX/5.4MG SOPN Inject 1.5 mg into the skin once a week. D/c 0.75 3 mL 2   DULoxetine (CYMBALTA) 30 MG capsule Take 1 capsule (30 mg total) by mouth daily. 90 capsule 3   ezetimibe (ZETIA) 10 MG tablet Take 1 tablet (10 mg total) by mouth daily. 90 tablet 3   Lancets Ultra Fine MISC 1 Device by Does not apply route 2 (two) times daily as needed. Preferred for her device 180 each 3   LANTUS SOLOSTAR 100 UNIT/ML Solostar Pen Inject 40 Units into the skin daily. 15 mL 11   leflunomide (ARAVA) 20 MG tablet Take 20 mg by mouth daily. rheumatology     losartan (COZAAR) 100  MG tablet Take 1 tablet (100 mg total) by mouth daily.     nebivolol (BYSTOLIC) 5 MG tablet Take 1 tablet (5 mg total) by mouth daily. Dc tenoretic 90 tablet 3   potassium chloride SA (KLOR-CON M) 20 MEQ tablet Take 1 tablet (20 mEq total) by mouth every other day. 45 tablet 3   pravastatin (PRAVACHOL) 40 MG tablet Take 1 tablet (40 mg total) by mouth daily. 90 tablet 3   XARELTO 2.5 MG TABS tablet Take  1 tablet (2.5 mg total) by mouth 2 (two) times daily. 60 tablet 11   No facility-administered medications prior to visit.

## 2022-05-08 NOTE — Progress Notes (Unsigned)
Cardiology Office Note:    Date:  05/10/2022   ID:  Natalie, Watts 08/24/1945, MRN [id number]  PCP:  McLean-Scocuzza, Nino Glow, MD  Cardiologist:  Sinclair Grooms, MD   Referring MD: McLean-Scocuzza, Natalie Watts *   Chief Complaint  Patient presents with   Follow-up    PAD Hyperlipidemia Hypertension    History of Present Illness:    Natalie Watts is a 76 y.o. female with a hx of diabetes mellitus 2, carotid bruit, right foot drop from ruptured disc, CKD stage III, former smoker, PAD, hyperlipidemia, primary hypertension, obstructive sleep apnea oc C-PAP, aortic atherosclerosis, and concern about anti-platelet/DOAC regimen by Dr. Terese Door.   No real cardiac complaints.  Major concerns have to do with bilateral knee pain.  Denies chest pain, orthopnea, PND.  Difficult to tell if she is having claudication.  Some bruising on triple drug therapy.  She syncope and neurological symptoms.  She has not taken her medications yet today.  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 Loann Quill [place]    COVID 19+    mid 11/2020   COVID-19    01/17/22   Degenerative disk disease    Diabetes ([place])    DVT (deep venous thrombosis) ([place])    DVT, lower extremity ([place])    left, 05/2013 on Xarelto x 1 month etiology unkown    GERD (gastroesophageal reflux disease)    Gout    H/O myocardial perfusion scan    06/08/20 low risk   Hammer toes, bilateral     HSV-2 infection    Hyperlipidemia    Hypertension    Insomnia    Intertrigo    Lumbar radiculopathy    improved after steroid inj and PT 10/21/20 had right L4/5 L5-S1 transforaminal epidural steroid injections   Onychomycosis    OSA on CPAP    dx in 2020 after hip surgery CPAP 9-13 cm H20 supplies [employer] PSG 03/18/2019 severe OSA AHI 42.1/hr titration 03/27/2019 rec APAP 9-13 cm H20   PAD (peripheral artery disease) ([place])    1 x right, 3 x left in 2020-2022 needs bypass on leg had CTA with run off 11/2020 in [place]   PAD (peripheral artery disease) (HCC)    with stents   Peripheral vascular disease ([place])    Right foot drop    since 2020   Subdeltoid bursitis    Tinea pedis    Vitamin D deficiency     Past Surgical History:  Procedure Laterality Date   ABDOMINAL AORTOGRAM W/LOWER EXTREMITY N/A 06/01/2021   Procedure: ABDOMINAL AORTOGRAM W/LOWER EXTREMITY;  Surgeon: Serafina Mitchell, MD;  Location: [place] [facility];  Service: Cardiovascular;  Laterality: N/A;   BACK SURGERY     cervical laminoplasty C3-C7 Rex Dr. Sheppard Evens fusion/decompression 2/2 myelopathy 03/2014 or 02/2015   COLON SURGERY  colectomy in 2015   COLONOSCOPY  05/01/2020   [place] Dr. Edwena Bunde [place] 12 mm d colon polyp, grade 1 IH, moderate diverticulosis tubular   ESOPHAGOGASTRODUODENOSCOPY  05/01/2020   erosive gastritis Dr. Tilford Pillar [place]   PERIPHERAL VASCULAR INTERVENTION  06/01/2021   Procedure: PERIPHERAL VASCULAR INTERVENTION;  Surgeon: Serafina Mitchell, MD;  Location: [place] [facility];  Service: Cardiovascular;;  RT. SFA   right total hip Right    03/05/2019 or 02/26/2020   TUBAL LIGATION     UPPER GI ENDOSCOPY     05/01/20   VASCULAR SURGERY     08/19/19   VASCULAR SURGERY     07/05/19   VASCULAR SURGERY     05/21/2019 angioplasty since 05/2019, 06/2019 and 08/2019 had 2 left leg and 1 on right leg    Current Medications: Current Meds  Medication Sig    acetaminophen (TYLENOL) 500 MG tablet Take 1,000 mg by mouth 2 (two) times daily as needed for moderate pain or mild pain. Rapid release   amLODipine (NORVASC) 5 MG tablet Take 1 tablet (5 mg total) by mouth daily.   B-D UF III MINI PEN NEEDLES 31G X 5 MM MISC Inject 1 each into the skin 2 (two) times daily. Or preferred choice of the patient   Blood Glucose Monitoring Suppl (ONE TOUCH ULTRA 2) w/Device KIT Used to check blood sugars daily.   Cholecalciferol (VITAMIN D3) 50 MCG (2000 UT) capsule Take 2,000 Units by mouth daily.   clobetasol cream (TEMOVATE) 0.96 % Apply 1 Application topically 2 (two) times daily. Legs and hands prn   clopidogrel (PLAVIX) 75 MG tablet TAKE 1 TABLET BY MOUTH EVERY DAY   Continuous Blood Gluc Receiver (FREESTYLE LIBRE 2 READER) DEVI 1 Device by Does not apply route every 14 (fourteen) days.   Dulaglutide (TRULICITY) 1.5 GE/3.6OQ SOPN Inject 1.5 mg into the skin once a week. D/c 0.75   DULoxetine (CYMBALTA) 30 MG capsule Take 1 capsule (30 mg total) by mouth daily.   ezetimibe (ZETIA) 10 MG tablet Take 1 tablet (10 mg total) by mouth daily.   Lancets Ultra Fine MISC 1 Device by Does not apply route 2 (two) times daily as needed. Preferred for her device   LANTUS SOLOSTAR 100 UNIT/ML Solostar Pen Inject 40 Units into the skin daily.   leflunomide (ARAVA) 20 MG tablet Take 20 mg by mouth daily. rheumatology   losartan (COZAAR) 100 MG tablet Take 1 tablet (100 mg total) by mouth daily.   nebivolol (BYSTOLIC) 5 MG tablet Take 1 tablet (5 mg total) by mouth daily. Dc tenoretic   potassium chloride SA (KLOR-CON M) 20 MEQ tablet Take 1 tablet (20 mEq total) by mouth every other day.   pravastatin (PRAVACHOL) 40 MG tablet Take 1 tablet (40 mg total) by mouth daily.   XARELTO 2.5 MG TABS tablet Take 1 tablet (2.5 mg total) by mouth 2 (two) times daily.   [DISCONTINUED] aspirin EC 81 MG tablet Take 1 tablet (81 mg total) by mouth daily. Swallow whole.     Allergies:    Lisinopril, Sulfamethoxazole-trimethoprim, Dapagliflozin, Gabapentin, Glipizide, Metformin, Metformin and related, Penicillin g, Penicillins, and Tramadol   Social History   Socioeconomic History   Marital status: Divorced    Spouse name: Not on file   Number of children: Not on file   Years of education: Not on file   Highest education level: Not on file  Occupational History   Not  on file  Tobacco Use   Smoking status: Former    Packs/day: 1.00    Years: 45.00    Total pack years: 45.00    Types: Cigarettes    Quit date: 12/18/2012    Years since quitting: 9.3    Passive exposure: Never   Smokeless tobacco: Never  Vaping Use   Vaping Use: Never used  Substance and Sexual Activity   Alcohol use: No   Drug use: Yes    Frequency: 3.0 times per week   Sexual activity: Not on file  Other Topics Concern   Not on file  Social History Narrative   Divorced    2 daughters [name] comes to most appts and is Chief Executive Officer    -lives with daughter [name]    Used to work for [employer] now [employer]    Former smoker age 47 to age 76/2012 quit for sure in 2014 max 1/2 ppd FH lung cancer sister also smoker    Social Determinants of Health   Financial Resource Strain: Low Risk  (01/02/2018)   Overall Financial Resource Strain (CARDIA)    Difficulty of Paying Living Expenses: Not hard at all  Food Insecurity: No Food Insecurity (09/22/2021)   Hunger Vital Sign    Worried About Running Out of Food in the Last Year: Never true    Ran Out of Food in the Last Year: Never true  Transportation Needs: No Transportation Needs (09/22/2021)   PRAPARE - Hydrologist (Medical): No    Lack of Transportation (Non-Medical): No  Physical Activity: Insufficiently Active (09/22/2021)   Exercise Vital Sign    Days of Exercise per Week: 1 day    Minutes of Exercise per Session: 60 min  Stress: No Stress Concern Present (09/22/2021)   [place]    Feeling of Stress : Not at all  Social Connections: Unknown (09/22/2021)   Social Connection and Isolation Panel [NHANES]    Frequency of Communication with Friends and Family: More than three times a week    Frequency of Social Gatherings with Friends and Family: More than three times a week    Attends Religious Services: More than 4 times per year    Active Member of [employer] or Organizations: Not on file    Attends Music therapist: More than 4 times per year    Marital Status: Not on file     Family History: The patient's family history includes Arthritis in her mother and sister; Breast cancer in her maternal grandmother and another family member; Cancer in her father, maternal grandmother, sister, and another family member; Early death in her sister; Hypertension in her daughter, daughter, maternal grandmother, mother, and sister.  ROS:   Please see the history of present illness.    Concerned that her primary physician will be leaving.  All other systems reviewed and are negative.  EKGs/Labs/Other Studies Reviewed:    The following studies were reviewed today: No new imaging  EKG:  EKG not repeated  Recent Labs: 03/11/2022: ALT 20; BUN 18; Creatinine, Ser 1.02; Hemoglobin 12.2; Platelets 133; Potassium 4.0; Sodium 142  Recent Lipid Panel    Component Value Date/Time   CHOL 171 02/22/2022 1016   TRIG 114.0 02/22/2022 1016   HDL 58.30 02/22/2022 1016   CHOLHDL 3 02/22/2022 1016   VLDL 22.8 02/22/2022 1016   LDLCALC 90 02/22/2022 1016    Physical Exam:  VS:  BP (!) 154/76   Pulse 91   Ht _0  (1.549 m)   Wt 148 lb (67.1 kg)   SpO2 98%   BMI 27.96 kg/m     Wt Readings from Last 3 Encounters:  05/10/22 148 lb (67.1 kg)  05/03/22 147 lb (66.7 kg)  03/29/22 146 lb (66.2 kg)     GEN: Elderly. No acute distress HEENT: Normal NECK: No JVD. LYMPHATICS: No lymphadenopathy CARDIAC: No murmur. RRR no gallop, or  edema. VASCULAR:  Normal Pulses. No bruits. RESPIRATORY:  Clear to auscultation without rales, wheezing or rhonchi  ABDOMEN: Soft, non-tender, non-distended, No pulsatile mass, MUSCULOSKELETAL: No deformity  SKIN: Warm and dry NEUROLOGIC:  Alert and oriented x 3 PSYCHIATRIC:  Normal affect   ASSESSMENT:    1. Claudication of both lower extremities ([place])   2. Aortic atherosclerosis ([place])   3. Primary hypertension   4. Hyperlipidemia LDL goal <70   5. Stage 3 chronic kidney disease, unspecified whether stage 3a or 3b CKD ([place])   6. Coronary artery disease involving native coronary artery of native heart without angina pectoris   7. Controlled type 2 diabetes mellitus with diabetic peripheral angiopathy without gangrene, unspecified whether long term insulin use ([place])    PLAN:    In order of problems listed above:  Continue walking.  I recommended decreasing antiplatelet/anticoagulation therapy by dropping aspirin and continuing clopidogrel and low-dose Xarelto.  Secondary prevention reviewed. Continue attempts to lower cholesterol.  I would recommend discontinuing pravastatin and switching to rosuvastatin 40 mg/day to get LDL less than 70. Blood pressure is elevated today but she has not had any of her medications.  No changes were made.  She is encouraged to make sure she does not forget to take the medications this evening. Lipids as noted above on atherosclerosis. Needs to be closely followed.  Consider SGLT2 therapy. Consider adding SGLT2 therapy.   Medication Adjustments/Labs and Tests Ordered: Current medicines are reviewed at length with the patient today.  Concerns regarding medicines are outlined above.  No orders of the defined types were placed in this encounter.  No orders of the defined types were placed in this encounter.   Patient Instructions  Medication Instructions:  Your physician has recommended you make the following change in your medication:   1) STOP  aspirin  *If you need a refill on your cardiac medications before your next appointment, please call your pharmacy*  Follow-Up: At [facility], you and your health needs are our priority.  As part of our continuing mission to provide you with exceptional heart care, we have created designated Provider Care Teams.  These Care Teams include your primary Cardiologist (physician) and Advanced Practice Providers (APPs -  Physician Assistants and Nurse Practitioners) who all work together to provide you with the care you need, when you need it.  Your next appointment:   6 month(s)  The format for your next appointment:   In Person  Provider:   New Cardiologist (you may request one of our providers or have one randomly assigned to you)  Important Information About Sugar         Signed, Sinclair Grooms, MD  05/10/2022 4:00 PM    [place]

## 2022-05-10 ENCOUNTER — Encounter: Payer: Self-pay | Admitting: Interventional Cardiology

## 2022-05-10 ENCOUNTER — Telehealth: Payer: Self-pay

## 2022-05-10 ENCOUNTER — Ambulatory Visit: Payer: Medicare PPO | Attending: Interventional Cardiology | Admitting: Interventional Cardiology

## 2022-05-10 VITALS — BP 154/76 | HR 91 | Ht 61.0 in | Wt 148.0 lb

## 2022-05-10 DIAGNOSIS — I10 Essential (primary) hypertension: Secondary | ICD-10-CM

## 2022-05-10 DIAGNOSIS — I2510 Atherosclerotic heart disease of native coronary artery without angina pectoris: Secondary | ICD-10-CM

## 2022-05-10 DIAGNOSIS — N183 Chronic kidney disease, stage 3 unspecified: Secondary | ICD-10-CM

## 2022-05-10 DIAGNOSIS — E785 Hyperlipidemia, unspecified: Secondary | ICD-10-CM | POA: Diagnosis not present

## 2022-05-10 DIAGNOSIS — E1151 Type 2 diabetes mellitus with diabetic peripheral angiopathy without gangrene: Secondary | ICD-10-CM

## 2022-05-10 DIAGNOSIS — I700 Atherosclerosis of aorta: Secondary | ICD-10-CM | POA: Diagnosis not present

## 2022-05-10 DIAGNOSIS — I739 Peripheral vascular disease, unspecified: Secondary | ICD-10-CM | POA: Diagnosis not present

## 2022-05-10 NOTE — Telephone Encounter (Signed)
Spoke with Lavella Lemons at [facility] health in regards to pt needing new Plan of Care orders sent under Dr. Hinda Lenis name as Dr. Karlyn Agee- Scoccuzza is no longer a provider here in office and pt has an upcoming appt with Dr. Volanda Napoleon ion 05/31/22.

## 2022-05-10 NOTE — Patient Instructions (Signed)
Medication Instructions:  Your physician has recommended you make the following change in your medication:   1) STOP aspirin  *If you need a refill on your cardiac medications before your next appointment, please call your pharmacy*  Follow-Up: At [facility], you and your health needs are our priority.  As part of our continuing mission to provide you with exceptional heart care, we have created designated Provider Care Teams.  These Care Teams include your primary Cardiologist (physician) and Advanced Practice Providers (APPs -  Physician Assistants and Nurse Practitioners) who all work together to provide you with the care you need, when you need it.  Your next appointment:   6 month(s)  The format for your next appointment:   In Person  Provider:   New Cardiologist (you may request one of our providers or have one randomly assigned to you)  Important Information About Sugar

## 2022-05-24 ENCOUNTER — Other Ambulatory Visit: Payer: Self-pay

## 2022-05-24 ENCOUNTER — Emergency Department (HOSPITAL_COMMUNITY): Payer: Medicare PPO

## 2022-05-24 ENCOUNTER — Encounter (HOSPITAL_COMMUNITY): Payer: Self-pay

## 2022-05-24 ENCOUNTER — Emergency Department (HOSPITAL_COMMUNITY)
Admission: EM | Admit: 2022-05-24 | Discharge: 2022-05-24 | Disposition: A | Discharge status: Home / Self Care | Payer: Medicare PPO | Attending: Emergency Medicine | Admitting: Emergency Medicine

## 2022-05-24 DIAGNOSIS — M25562 Pain in left knee: Secondary | ICD-10-CM | POA: Insufficient documentation

## 2022-05-24 DIAGNOSIS — Z7901 Long term (current) use of anticoagulants: Secondary | ICD-10-CM | POA: Insufficient documentation

## 2022-05-24 DIAGNOSIS — M25561 Pain in right knee: Secondary | ICD-10-CM | POA: Insufficient documentation

## 2022-05-24 DIAGNOSIS — W19XXXA Unspecified fall, initial encounter: Secondary | ICD-10-CM | POA: Diagnosis not present

## 2022-05-24 DIAGNOSIS — R7989 Other specified abnormal findings of blood chemistry: Secondary | ICD-10-CM | POA: Insufficient documentation

## 2022-05-24 DIAGNOSIS — M25551 Pain in right hip: Secondary | ICD-10-CM | POA: Insufficient documentation

## 2022-05-24 DIAGNOSIS — Z1152 Encounter for screening for COVID-19: Secondary | ICD-10-CM | POA: Diagnosis not present

## 2022-05-24 DIAGNOSIS — I129 Hypertensive chronic kidney disease with stage 1 through stage 4 chronic kidney disease, or unspecified chronic kidney disease: Secondary | ICD-10-CM | POA: Insufficient documentation

## 2022-05-24 DIAGNOSIS — Z79899 Other long term (current) drug therapy: Secondary | ICD-10-CM | POA: Diagnosis not present

## 2022-05-24 DIAGNOSIS — E1122 Type 2 diabetes mellitus with diabetic chronic kidney disease: Secondary | ICD-10-CM | POA: Diagnosis not present

## 2022-05-24 DIAGNOSIS — R748 Abnormal levels of other serum enzymes: Secondary | ICD-10-CM

## 2022-05-24 DIAGNOSIS — S060X0A Concussion without loss of consciousness, initial encounter: Secondary | ICD-10-CM | POA: Insufficient documentation

## 2022-05-24 DIAGNOSIS — I2510 Atherosclerotic heart disease of native coronary artery without angina pectoris: Secondary | ICD-10-CM | POA: Diagnosis not present

## 2022-05-24 DIAGNOSIS — N189 Chronic kidney disease, unspecified: Secondary | ICD-10-CM | POA: Insufficient documentation

## 2022-05-24 DIAGNOSIS — S0990XA Unspecified injury of head, initial encounter: Secondary | ICD-10-CM | POA: Diagnosis present

## 2022-05-24 LAB — COMPREHENSIVE METABOLIC PANEL
ALT: 18 U/L (ref 0–44)
AST: 31 U/L (ref 15–41)
Albumin: 4 g/dL (ref 3.5–5.0)
Alkaline Phosphatase: 76 U/L (ref 38–126)
Anion gap: 19 — ABNORMAL HIGH (ref 5–15)
BUN: 16 mg/dL (ref 8–23)
CO2: 19 mmol/L — ABNORMAL LOW (ref 22–32)
Calcium: 9.5 mg/dL (ref 8.9–10.3)
Chloride: 102 mmol/L (ref 98–111)
Creatinine, Ser: 0.89 mg/dL (ref 0.44–1.00)
GFR, Estimated: >60 mL/min (ref >60)
Glucose, Bld: 266 mg/dL — ABNORMAL HIGH (ref 70–99)
Potassium: 4 mmol/L (ref 3.5–5.1)
Sodium: 140 mmol/L (ref 135–145)
Total Bilirubin: 0.5 mg/dL (ref 0.3–1.2)
Total Protein: 7.4 g/dL (ref 6.5–8.1)

## 2022-05-24 LAB — CBC
HCT: 40.6 % (ref 36.0–46.0)
Hemoglobin: 12.8 g/dL (ref 12.0–15.0)
MCH: 30.2 pg (ref 26.0–34.0)
MCHC: 31.5 g/dL (ref 30.0–36.0)
MCV: 95.8 fL (ref 80.0–100.0)
Platelets: 142 10*3/uL — ABNORMAL LOW (ref 150–400)
RBC: 4.24 MIL/uL (ref 3.87–5.11)
RDW: 13.2 % (ref 11.5–15.5)
WBC: 5.7 10*3/uL (ref 4.0–10.5)
nRBC: 0 % (ref 0.0–0.2)

## 2022-05-24 LAB — LIPASE, BLOOD: Lipase: 36 U/L (ref 11–51)

## 2022-05-24 LAB — CK
Total CK: 1869 U/L — ABNORMAL HIGH (ref 38–234)
Total CK: 1790 U/L — ABNORMAL HIGH (ref 38–234)

## 2022-05-24 LAB — RESP PANEL BY RT-PCR (FLU A&B, COVID) ARPGX2
Influenza A by PCR: NEGATIVE
Influenza B by PCR: NEGATIVE
SARS Coronavirus 2 by RT PCR: NEGATIVE

## 2022-05-24 LAB — TROPONIN I (HIGH SENSITIVITY)
Troponin I (High Sensitivity): 20 ng/L — ABNORMAL HIGH (ref <18)
Troponin I (High Sensitivity): 24 ng/L — ABNORMAL HIGH (ref <18)

## 2022-05-24 MED ORDER — ONDANSETRON HCL 4 MG/2ML IJ SOLN
4.0000 mg | Freq: Once | INTRAMUSCULAR | Status: AC
  Administered 2022-05-24: 4 mg via INTRAVENOUS
  Filled 2022-05-24: qty 2

## 2022-05-24 MED ORDER — FENTANYL CITRATE PF 50 MCG/ML IJ SOSY: 25.0000 ug | PREFILLED_SYRINGE | INTRAMUSCULAR | Status: DC | PRN

## 2022-05-24 MED ORDER — OXYCODONE-ACETAMINOPHEN 5-325 MG PO TABS
1.0000 | ORAL_TABLET | Freq: Once | ORAL | Status: AC
  Administered 2022-05-24: 1 via ORAL
  Filled 2022-05-24: qty 1

## 2022-05-24 MED ORDER — FENTANYL CITRATE PF 50 MCG/ML IJ SOSY
25.0000 ug | PREFILLED_SYRINGE | Freq: Once | INTRAMUSCULAR | Status: AC
  Administered 2022-05-24: 25 ug via INTRAVENOUS
  Filled 2022-05-24: qty 1

## 2022-05-24 MED ORDER — SODIUM CHLORIDE 0.9 % IV BOLUS
500.0000 mL | Freq: Once | INTRAVENOUS | Status: AC
  Administered 2022-05-24: 500 mL via INTRAVENOUS

## 2022-05-24 NOTE — ED Notes (Signed)
Pt to radiology.

## 2022-05-24 NOTE — Discharge Instructions (Addendum)
The imaging studies did not show any new injuries.  With striking her head and having nausea, you can assume that you sustained a concussion.  You may experience headaches, nausea, and imbalance from this as you you heal.  Avoid activities that worsen your symptoms.  Avoid any further head injuries while you are healing.  Take Tylenol as needed for headache.  The lab test that checks for muscle enzymes was elevated today.  Given that you are only on the floor for short amount of time, this is unlikely from your fall.  It may be due to medication.  Pravastatin is a common medication that can cause this.  Talk to your primary care doctor and/your cardiologist about this medication.  Stay well-hydrated at home to avoid kidney injury from muscle enzymes in your blood.  Also talk to your primary care doctor about pain medications to manage your chronic arthritis.  Return to the emergency department for any new or worsening symptoms of concern.

## 2022-05-24 NOTE — ED Triage Notes (Signed)
Pt arrived POV from home c/o 2 falls over night. Pt states she hit the back of her head and is on a blood thinner.

## 2022-05-24 NOTE — ED Notes (Addendum)
Trauma Response Nurse Documentation  Natalie Watts is a 76 y.o. female arriving to [facility] ED via EMS  On Plavix. Trauma was activated as a Level 2 based on the following trauma criteria Elderly patients > 65 with head trauma on anti-coagulation (excluding ASA). Trauma team at the bedside on patient arrival.   Patient cleared for CT by Natalie. Doren Custard. Pt transported to CT with trauma response nurse present to monitor. RN remained with the patient throughout their absence from the department for clinical observation.   GCS 15.  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 Loann Quill Watts    COVID 19+    mid 11/2020   COVID-19    01/17/22   Degenerative disk disease    Diabetes ([place])    DVT (deep venous thrombosis) ([place])    DVT, lower extremity ([place])    left, 05/2013 on Xarelto x 1 month etiology unkown    GERD (gastroesophageal reflux disease)    Gout    H/O myocardial perfusion scan    06/08/20 low risk   Hammer toes, bilateral    HSV-2 infection    Hyperlipidemia    Hypertension    Insomnia    Intertrigo    Lumbar radiculopathy    improved after steroid inj and PT 10/21/20 had right L4/5 L5-S1 transforaminal epidural steroid injections   Onychomycosis    OSA on CPAP    dx in 2020 after hip surgery CPAP 9-13 cm H20 supplies [employer] PSG 03/18/2019 severe OSA AHI 42.1/hr titration 03/27/2019 rec APAP 9-13 cm H20   PAD (peripheral artery disease)  ([place])    1 x right, 3 x left in 2020-2022 needs bypass on leg had CTA with run off 11/2020 in [place]   PAD (peripheral artery disease) (HCC)    with stents   Peripheral vascular disease ([place])    Right foot drop    since 2020   Subdeltoid bursitis    Tinea pedis    Vitamin D deficiency      Past Surgical History:  Procedure Laterality Date   ABDOMINAL AORTOGRAM W/LOWER EXTREMITY N/A 06/01/2021   Procedure: ABDOMINAL AORTOGRAM W/LOWER EXTREMITY;  Surgeon: Natalie Watts;  Location: [place] [facility];  Service: Cardiovascular;  Laterality: N/A;   BACK SURGERY     cervical laminoplasty C3-C7 Rex Natalie. Sheppard [place] fusion/decompression 2/2 myelopathy 03/2014 or 02/2015   COLON SURGERY     colectomy in 2015   COLONOSCOPY  05/01/2020   [place] Natalie Watts 12 mm d colon polyp, grade 1 IH, moderate diverticulosis tubular   ESOPHAGOGASTRODUODENOSCOPY  05/01/2020   erosive gastritis Natalie. Tilford Pillar [place]   PERIPHERAL VASCULAR INTERVENTION  06/01/2021   Procedure: PERIPHERAL VASCULAR INTERVENTION;  Surgeon: Natalie Watts;  Location: [place] [facility];  Service:  Cardiovascular;;  RT. SFA   right total hip Right    03/05/2019 or 02/26/2020   TUBAL LIGATION     UPPER GI ENDOSCOPY     05/01/20   VASCULAR SURGERY     08/19/19   VASCULAR SURGERY     07/05/19   VASCULAR SURGERY     05/21/2019 angioplasty since 05/2019, 06/2019 and 08/2019 had 2 left leg and 1 on right leg     Initial Focused Assessment (If applicable, or please see trauma documentation): See charting  CT's Completed:   CT Head and CT C-Spine   Interventions:  See charting  Plan for disposition:  Discharge home   Event Summary: Brought in POV, fall at home on blood thinners. Imaging revealed no traumatic injury. Patient to be discharged home with daughter.  Bedside handoff with ED RN Gae Bon.    Park Pope Blaire Hodsdon  Trauma Response RN  Please call TRN at [phone] for further  assistance.

## 2022-05-24 NOTE — ED Notes (Signed)
Returned from [place] with RN.

## 2022-05-24 NOTE — ED Provider Triage Note (Signed)
Emergency Medicine Provider Triage Evaluation Note  Natalie Watts , a 76 y.o. female  was evaluated in triage.  Pt complains of fall x2. She fell last night around 2330, unclear mechanism, feels perhaps her leg gave out which it does from time to time. Unsure if head injury, denies LOC. She fell again around 0630 while turning a light switch off, she fell backwards and hit back of her head, no LOC but unable to get up. She is unclear regarding mechanism of her falls. She denies light headed, dizzy, cp or DIB preceeding fall. She vomited x2 after the fall. She has ongoing nausea. Pain to b/l knee, b/l hip. She walked 3-4 steps from car to wheel chair on arrival to ED.   Review of Systems  Positive: Fall, n/v Negative: syncope  Physical Exam  BP (!) 171/77 (BP Location: Right Arm)   Pulse 74   Temp 97.7 F (36.5 C)   Resp 15   Ht '5\' 1"'$  (1.549 m)   Wt 67.1 kg   SpO2 95%   BMI 27.96 kg/m  Gen:   Awake, no distress  NAD Resp:  Normal effort  MSK:   Moves extremities without difficulty  Other:  Neuro non-focal   Medical Decision Making  Medically screening exam initiated at 9:05 AM.  Appropriate orders placed.  SHABREKA COULON was informed that the remainder of the evaluation will be completed by another provider, this initial triage assessment does not replace that evaluation, and the importance of remaining in the ED until their evaluation is complete.  Level 2 trauma given fall w/ head injury on DOAC- [place], [place], DO 05/24/22 [phone]

## 2022-05-24 NOTE — ED Provider Notes (Signed)
[facility] EMERGENCY DEPARTMENT Provider Note   CSN: [id number] Arrival date & time: 05/24/22  0240     History  Chief Complaint  Patient presents with   Natalie Watts is a 76 y.o. female.   Fall Associated symptoms include headaches.  Patient presents after a fall.  Medical history includes CKD, DM, HTN, cervical spondylosis with radiculopathy, arthritis, GERD, HLD, PAD, CAD, gout.  She first fell at 1130 last night.  She believes that her leg may have given out from under her.  She denies LOC at that time.  This morning, at approximately 6:30 AM, she fell again while turning away from a light switch.  That time, she fell backwards and struck the back of her head.  She did not lose consciousness.  She had subsequent vomiting.  She is on Xarelto.  Last dose of Xarelto was yesterday.  Patient was able to stand and bear weight on her legs since the fall.  She does describe acute on chronic bilateral knee pain as well as any pain in her right hip.  She also has a mild posterior headache.     Home Medications Prior to Admission medications   Medication Sig Start Date End Date Taking? Authorizing Provider  acetaminophen (TYLENOL) 500 MG tablet Take 1,000 mg by mouth 2 (two) times daily as needed for moderate pain or mild pain. Rapid release    [provider]  amLODipine (NORVASC) 5 MG tablet Take 1 tablet (5 mg total) by mouth daily. 02/22/22   McLean-Scocuzza, Nino Glow, MD  B-D UF III MINI PEN NEEDLES 31G X 5 MM MISC Inject 1 each into the skin 2 (two) times daily. Or preferred choice of the patient 07/23/21   McLean-Scocuzza, Nino Glow, MD  Blood Glucose Monitoring Suppl (ONE TOUCH ULTRA 2) w/Device KIT Used to check blood sugars daily. 05/08/18   McLean-Scocuzza, Nino Glow, MD  Cholecalciferol (VITAMIN D3) 50 MCG (2000 UT) capsule Take 2,000 Units by mouth daily.    [provider]  clobetasol cream (TEMOVATE) 9.73 % Apply 1 Application topically 2  (two) times daily. Legs and hands prn 02/22/22   McLean-Scocuzza, Nino Glow, MD  clopidogrel (PLAVIX) 75 MG tablet TAKE 1 TABLET BY MOUTH EVERY DAY 11/17/21   McLean-Scocuzza, Nino Glow, MD  Continuous Blood Gluc Receiver (FREESTYLE LIBRE 2 READER) DEVI 1 Device by Does not apply route every 14 (fourteen) days. 07/23/21   McLean-Scocuzza, Nino Glow, MD  Dulaglutide (TRULICITY) 1.5 ZH/2.9JM SOPN Inject 1.5 mg into the skin once a week. D/c 0.75 02/22/22   McLean-Scocuzza, Nino Glow, MD  DULoxetine (CYMBALTA) 30 MG capsule Take 1 capsule (30 mg total) by mouth daily. 02/22/22   McLean-Scocuzza, Nino Glow, MD  ezetimibe (ZETIA) 10 MG tablet Take 1 tablet (10 mg total) by mouth daily. 02/22/22   McLean-Scocuzza, Nino Glow, MD  Lancets Ultra Fine MISC 1 Device by Does not apply route 2 (two) times daily as needed. Preferred for her device 07/23/21   McLean-Scocuzza, Nino Glow, MD  LANTUS SOLOSTAR 100 UNIT/ML Solostar Pen Inject 40 Units into the skin daily. 02/22/22   McLean-Scocuzza, Nino Glow, MD  leflunomide (ARAVA) 20 MG tablet Take 20 mg by mouth daily. rheumatology    [provider]  losartan (COZAAR) 100 MG tablet Take 1 tablet (100 mg total) by mouth daily. 02/22/22   McLean-Scocuzza, Nino Glow, MD  nebivolol (BYSTOLIC) 5 MG tablet Take 1 tablet (5 mg total) by mouth daily.  Dc tenoretic 02/22/22   McLean-Scocuzza, Nino Glow, MD  potassium chloride SA (KLOR-CON M) 20 MEQ tablet Take 1 tablet (20 mEq total) by mouth every other day. 02/22/22   McLean-Scocuzza, Nino Glow, MD  pravastatin (PRAVACHOL) 40 MG tablet Take 1 tablet (40 mg total) by mouth daily. 02/22/22   McLean-Scocuzza, Nino Glow, MD  XARELTO 2.5 MG TABS tablet Take 1 tablet (2.5 mg total) by mouth 2 (two) times daily. 12/29/21   Belva Crome, MD      Allergies    Lisinopril, Sulfamethoxazole-trimethoprim, Dapagliflozin, Gabapentin, Glipizide, Metformin, Metformin and related, Penicillin g, Penicillins, and Tramadol    Review of Systems   Review of Systems   Musculoskeletal:  Positive for arthralgias.  Neurological:  Positive for headaches.  All other systems reviewed and are negative.   Physical Exam Updated Vital Signs BP (!) 162/75 (BP Location: Left Arm)   Pulse 72   Temp 97.7 F (36.5 C) (Oral)   Resp 17   Ht _0  (1.549 m)   Wt 67.1 kg   SpO2 95%   BMI 27.96 kg/m  Physical Exam Vitals and nursing note reviewed.  Constitutional:      General: She is not in acute distress.    Appearance: Normal appearance. She is well-developed. She is not ill-appearing, toxic-appearing or diaphoretic.  HENT:     Head: Normocephalic and atraumatic.     Right Ear: External ear normal.     Left Ear: External ear normal.     Nose: Nose normal.     Mouth/Throat:     Mouth: Mucous membranes are moist.     Pharynx: Oropharynx is clear.  Eyes:     Extraocular Movements: Extraocular movements intact.     Conjunctiva/sclera: Conjunctivae normal.  Cardiovascular:     Rate and Rhythm: Normal rate and regular rhythm.     Heart sounds: No murmur heard. Pulmonary:     Effort: Pulmonary effort is normal. No respiratory distress.     Breath sounds: Normal breath sounds. No wheezing or rales.  Chest:     Chest wall: No tenderness.  Abdominal:     General: There is no distension.     Palpations: Abdomen is soft.     Tenderness: There is no abdominal tenderness.  Musculoskeletal:        General: Tenderness present. No swelling or deformity.     Cervical back: Normal range of motion and neck supple. No tenderness.     Right lower leg: No edema.     Left lower leg: No edema.  Skin:    General: Skin is warm and dry.     Capillary Refill: Capillary refill takes less than 2 seconds.     Coloration: Skin is not jaundiced or pale.  Neurological:     General: No focal deficit present.     Mental Status: She is alert and oriented to person, place, and time.     Cranial Nerves: No cranial nerve deficit.     Sensory: No sensory deficit.     Motor: No  weakness.     Coordination: Coordination normal.  Psychiatric:        Mood and Affect: Mood normal.        Behavior: Behavior normal.        Thought Content: Thought content normal.        Judgment: Judgment normal.     ED Results / Procedures / Treatments   Labs (all labs ordered are listed, but only  abnormal results are displayed) Labs Reviewed  CBC - Abnormal; Notable for the following components:      Result Value   Platelets 142 (*)    All other components within normal limits  CK - Abnormal; Notable for the following components:   Total CK 1,869 (*)    All other components within normal limits  COMPREHENSIVE METABOLIC PANEL - Abnormal; Notable for the following components:   CO2 19 (*)    Glucose, Bld 266 (*)    Anion gap 19 (*)    All other components within normal limits  CK - Abnormal; Notable for the following components:   Total CK 1,790 (*)    All other components within normal limits  TROPONIN I (HIGH SENSITIVITY) - Abnormal; Notable for the following components:   Troponin I (High Sensitivity) 20 (*)    All other components within normal limits  TROPONIN I (HIGH SENSITIVITY) - Abnormal; Notable for the following components:   Troponin I (High Sensitivity) 24 (*)    All other components within normal limits  RESP PANEL BY RT-PCR (FLU A&B, COVID) ARPGX2  LIPASE, BLOOD    EKG EKG Interpretation  Date/Time:  Tuesday May 24 2022 09:37:33 EST Ventricular Rate:  70 PR Interval:  174 QRS Duration: 84 QT Interval:  428 QTC Calculation: 462 R Axis:   65 Text Interpretation: Sinus rhythm Confirmed by Godfrey Pick [phone]) on 05/24/2022 1:19:41 PM  Radiology DG Chest 1 View  Result Date: 05/24/2022 CLINICAL DATA:  Fall. EXAM: CHEST  1 VIEW COMPARISON:  March 11, 2022. FINDINGS: The heart size and mediastinal contours are within normal limits. Both lungs are clear. The visualized skeletal structures are unremarkable. IMPRESSION: No active disease.  Electronically Signed   By: Marijo Conception M.D.   On: 05/24/2022 11:33   DG Hip Unilat W or Wo Pelvis 2-3 Views Right  Result Date: 05/24/2022 CLINICAL DATA:  Right hip pain after multiple falls. EXAM: DG HIP (WITH OR WITHOUT PELVIS) 2-3V RIGHT COMPARISON:  None Available. FINDINGS: Status post right total hip arthroplasty. Moderate degenerative changes are seen involving the left hip. No acute fracture or dislocation is noted. IMPRESSION: No acute abnormality seen. Electronically Signed   By: Marijo Conception M.D.   On: 05/24/2022 11:32   DG Knee Complete 4 Views Right  Result Date: 05/24/2022 CLINICAL DATA:  Right knee pain after multiple falls. EXAM: RIGHT KNEE - COMPLETE 4+ VIEW COMPARISON:  None Available. FINDINGS: No evidence of fracture, dislocation, or joint effusion. Moderate narrowing of lateral joint space is noted. Narrowing is noted medially. Soft tissues are unremarkable. IMPRESSION: Mild to moderate degenerative joint disease. No acute abnormality seen. Electronically Signed   By: Marijo Conception M.D.   On: 05/24/2022 11:30   DG Knee Complete 4 Views Left  Result Date: 05/24/2022 CLINICAL DATA:  Left knee pain after multiple falls. EXAM: LEFT KNEE - COMPLETE 4+ VIEW COMPARISON:  None Available. FINDINGS: No evidence of fracture, dislocation, or joint effusion. Moderate narrowing of lateral joint space is noted. Mild narrowing is noted medially. Soft tissues are unremarkable. IMPRESSION: Mild to moderate degenerative joint disease. No acute abnormality seen. Electronically Signed   By: Marijo Conception M.D.   On: 05/24/2022 11:29   CT Cervical Spine Wo Contrast  Result Date: 05/24/2022 CLINICAL DATA:  76 year old female status post multiple falls since yesterday. Fall backwards. EXAM: CT CERVICAL SPINE WITHOUT CONTRAST TECHNIQUE: Multidetector CT imaging of the cervical spine was performed without intravenous contrast.  Multiplanar CT image reconstructions were also generated. RADIATION  DOSE REDUCTION: This exam was performed according to the departmental dose-optimization program which includes automated exposure control, adjustment of the mA and/or kV according to patient size and/or use of iterative reconstruction technique. COMPARISON:  Head CT today.  Cervical Spine MRI 11/25/2021. FINDINGS: Alignment: Chronic straightening of cervical lordosis appears stable since June. Cervicothoracic junction alignment is within normal limits. Bilateral posterior element alignment is within normal limits. Skull base and vertebrae: Postoperative details are below. Bone mineralization is within normal limits for age. Visualized skull base is intact. No atlanto-occipital dissociation. C1 and C2 appear intact and aligned. No acute osseous abnormality identified. Soft tissues and spinal canal: No prevertebral fluid or swelling. No visible canal hematoma. Posterior spinal hardware streak artifact. Calcified cervical carotid atherosclerosis greater on the right. Disc levels: Previous surgical decompression and posterior fusion C3-C4 through C6-C7, with solid-appearing posterior element and/or interbody ankylosis at those levels. No evidence of laminar hardware loosening. Ankylosis continues through the C7-T1 level. Superimposed C2-C3 facet arthropathy greater on the right. Upper chest: Upper thoracic levels appear intact. Calcified aortic atherosclerosis. Mild apical lung scarring and/or atelectasis. IMPRESSION: 1. No acute traumatic injury identified in the cervical spine. 2. Solid cervical fusion C3 through T1 with posterior laminar hardware and previous C3 through C6 level posterior decompression. 3.  Aortic Atherosclerosis (ICD10-I70.0). Electronically Signed   By: Genevie Ann M.D.   On: 05/24/2022 10:41   CT Head Wo Contrast  Result Date: 05/24/2022 CLINICAL DATA:  76 year old female status post multiple falls since yesterday. Fall backwards. EXAM: CT HEAD WITHOUT CONTRAST TECHNIQUE: Contiguous axial images  were obtained from the base of the skull through the vertex without intravenous contrast. RADIATION DOSE REDUCTION: This exam was performed according to the departmental dose-optimization program which includes automated exposure control, adjustment of the mA and/or kV according to patient size and/or use of iterative reconstruction technique. COMPARISON:  Brain MRI 10/13/2021.  Head CT 03/11/2022. FINDINGS: Brain: Cerebral volume remains normal for age. No midline shift, ventriculomegaly, mass effect, evidence of mass lesion, intracranial hemorrhage or evidence of cortically based acute infarction. Advanced chronic white matter hypodensity and heterogeneity in the bilateral deep gray nuclei appears stable since September. Chronic brainstem involvement better demonstrated by MRI. Stable gray-white matter differentiation throughout the brain. Vascular: Calcified atherosclerosis at the skull base. No suspicious intracranial vascular hyperdensity. Skull: Stable.  No acute osseous abnormality identified. Sinuses/Orbits: Visualized paranasal sinuses and mastoids are stable and well aerated. Other: No acute orbit or scalp soft tissue finding. IMPRESSION: 1. No acute intracranial abnormality or acute traumatic injury identified. 2. Advanced chronic small vessel disease appears stable by CT since September. Electronically Signed   By: Genevie Ann M.D.   On: 05/24/2022 10:33    Procedures Procedures    Medications Ordered in ED Medications  fentaNYL (SUBLIMAZE) injection 25 mcg (has no administration in time range)  sodium chloride 0.9 % bolus 500 mL (0 mLs Intravenous Stopped 05/24/22 1144)  ondansetron (ZOFRAN) injection 4 mg (4 mg Intravenous Given 05/24/22 0958)  fentaNYL (SUBLIMAZE) injection 25 mcg (25 mcg Intravenous Given 05/24/22 0959)  oxyCODONE-acetaminophen (PERCOCET/ROXICET) 5-325 MG per tablet 1 tablet (1 tablet Oral Given 05/24/22 1407)    ED Course/ Medical Decision Making/ A&P                            Medical Decision Making Amount and/or Complexity of Data Reviewed Labs: ordered. Radiology: ordered.  Risk Prescription drug management.   This patient presents to the ED for concern of fall, this involves an extensive number of treatment options, and is a complaint that carries with it a high risk of complications and morbidity.  The differential diagnosis includes syncope, acute injuries, ICH, dehydration, infection, metabolic derangements   Co morbidities that complicate the patient evaluation  CKD, DM, HTN, cervical spondylosis with radiculopathy, arthritis, GERD, HLD, PAD, CAD, gout   Additional history obtained:  Additional history obtained from patient's daughter External records from outside source obtained and reviewed including EMR   Lab Tests:  I Ordered, and personally interpreted labs.  The pertinent results include: Moderately elevated glucose, mild elevations in troponin and CK without change on delta, normal hemoglobin, no leukocytosis   Imaging Studies ordered:  I ordered imaging studies including CT of head and cervical spine, x-ray imaging of bilateral knees, right hip, and chest I independently visualized and interpreted imaging which showed no acute findings I agree with the radiologist interpretation   Cardiac Monitoring: / EKG:  The patient was maintained on a cardiac monitor.  I personally viewed and interpreted the cardiac monitored which showed an underlying rhythm of: Sinus rhythm   Problem List / ED Course / Critical interventions / Medication management  Patient presents after a ground-level fall.  She describes as mechanical, caused by loss of balance while ambulating in her room.  She did fall onto a carpeted floor.  She did strike the back of her head against the floor.  Per daughter, family was nearby and were able to come to her assistance.  She does not suspect prolonged downtime on the floor.  Patient has balance issues at baseline.   She typically walks with a walker.  At the time of the fall, she did not have her walker nearby.  Patient is on Xarelto.  On arrival in the ED, she is alert and oriented.  She does endorse a mild posterior headache as well as bilateral knee pain.  She states that she does have chronic knee pain and this has been attributed to arthritis.  Patient was given Percocet for analgesia.  Trauma workup was initiated.  Glucose was elevated and patient was given IV fluids.  Trauma imaging did not reveal any acute injuries.  I suspect her knee pain is from chronic arthritis.  I do also suspect the patient sustained a concussion from this fall.  She did have nausea and vomiting prior to arrival.  This was resolved while in the ED.  Lab work showed an elevation in [place].  I confirmed that she does not have prolonged downtime on the floor.  On repeat CK, there was no significant change.  She is on pravastatin and this may be contributing to her elevation in CK as well as muscle weakness which would further impair her ambulation.  She was advised to follow-up with her primary care doctor regarding this.  She is also advised to continue good hydration at home given his elevation in CK in addition to her elevated blood sugar.  Patient and daughter do feel comfortable taking her home.  She does live with family who will be able to assist her.  She is stable for discharge at this time. I ordered medication including IV fluids for hydration, Percocet for analgesia Reevaluation of the patient after these medicines showed that the patient improved I have reviewed the patients home medicines and have made adjustments as needed   Social Determinants of Health:  Lives  at home with family, has access to outpatient care         Final Clinical Impression(s) / ED Diagnoses Final diagnoses:  Fall, initial encounter  Concussion without loss of consciousness, initial encounter  Elevated CK    Rx / DC Orders ED Discharge Orders      None         Godfrey Pick, MD 05/24/22 [phone]

## 2022-05-24 NOTE — ED Notes (Signed)
To CT with RN.

## 2022-05-24 NOTE — ED Notes (Signed)
Pt ambulatory to bathroom with walker and RN. Very unsteady. MD informed.

## 2022-05-27 ENCOUNTER — Telehealth: Payer: Self-pay

## 2022-05-27 NOTE — Telephone Encounter (Signed)
Received Home health Plan of care orders to be signed for pt but the orders have Dr. Audrie Gallus name on it; so I reached out to [facility] and I spoke with [place] and explained to her that the pt has an upcoming appt w/ Dr. Volanda Napoleon on 05/31/22 TOC.    [place] verbalized understanding and stated she would get Dr. Hinda Lenis name on the orders.

## 2022-05-31 ENCOUNTER — Encounter: Payer: Self-pay | Admitting: Family Medicine

## 2022-05-31 ENCOUNTER — Telehealth: Payer: Self-pay

## 2022-05-31 ENCOUNTER — Ambulatory Visit: Payer: Medicare PPO | Admitting: Family Medicine

## 2022-05-31 VITALS — BP 139/80 | HR 78 | Temp 98.5°F | Ht 61.0 in | Wt 145.0 lb

## 2022-05-31 DIAGNOSIS — I739 Peripheral vascular disease, unspecified: Secondary | ICD-10-CM | POA: Diagnosis not present

## 2022-05-31 DIAGNOSIS — N183 Chronic kidney disease, stage 3 unspecified: Secondary | ICD-10-CM

## 2022-05-31 DIAGNOSIS — J439 Emphysema, unspecified: Secondary | ICD-10-CM | POA: Diagnosis not present

## 2022-05-31 DIAGNOSIS — I700 Atherosclerosis of aorta: Secondary | ICD-10-CM

## 2022-05-31 DIAGNOSIS — Z23 Encounter for immunization: Secondary | ICD-10-CM

## 2022-05-31 DIAGNOSIS — E1159 Type 2 diabetes mellitus with other circulatory complications: Secondary | ICD-10-CM

## 2022-05-31 DIAGNOSIS — R918 Other nonspecific abnormal finding of lung field: Secondary | ICD-10-CM

## 2022-05-31 DIAGNOSIS — R748 Abnormal levels of other serum enzymes: Secondary | ICD-10-CM

## 2022-05-31 DIAGNOSIS — E1151 Type 2 diabetes mellitus with diabetic peripheral angiopathy without gangrene: Secondary | ICD-10-CM

## 2022-05-31 DIAGNOSIS — E1122 Type 2 diabetes mellitus with diabetic chronic kidney disease: Secondary | ICD-10-CM | POA: Diagnosis not present

## 2022-05-31 DIAGNOSIS — M069 Rheumatoid arthritis, unspecified: Secondary | ICD-10-CM

## 2022-05-31 DIAGNOSIS — I152 Hypertension secondary to endocrine disorders: Secondary | ICD-10-CM

## 2022-05-31 MED ORDER — EMPAGLIFLOZIN 10 MG PO TABS: 10.0000 mg | ORAL_TABLET | Freq: Every day | ORAL | Status: DC

## 2022-05-31 NOTE — Patient Instructions (Addendum)
It was a pleasure meeting you today. Thank you for allowing me to take part in your health care.  Our goals for today as we discussed include:  Continue Trulicity 1.5 mg weekly Tonight take 15 units of the Insulin Glargine Starting tomorrow 12/13 Decrease Insulin Glargine to 30 units in the morning Start Jardiance 10 mg daily, if you start having diarrhea please let me know and will discontinue.  Monitor blood sugars   Stop Pravastatin Will recheck your labs in 4 weeks.  If lowered plan to switch to Crestor for better cholesterol lowering.  If you have any questions or concerns, please do not hesitate to call the office at [phone].  I look forward to our next visit and until then take care and stay safe.  Regards,   Carollee Leitz, MD   [facility]

## 2022-05-31 NOTE — Progress Notes (Signed)
SUBJECTIVE:   Chief Complaint  Patient presents with   Hospitalization Follow-up   HPI Patient presents to clinic for hospital follow-up and transfer of care.  Patient recently seen in the ED 12/05 for fall x 2.  Initial fall felt like her leg gave out, second fall she reported having fallen backward and hit her head.  No loss of consciousness.  However given that she is currently on Xarelto CT head was completed and negative for acute intracranial abnormality or traumatic injury.  Was noted advanced chronic small vessel disease that was stable from previous CT.  Since then patient reports that she has not had any similar episodes.  Denies any headaches, visual changes, slurred speech, weakness, numbness or tingling.  She does report history of multiple falls in the past.    PAD with claudication Stable.  Follows with Dr. Tamala Julian, cardiology.  Currently taking Plavix 75 mg and Xarelto 2.5 mg twice daily.  ASA was discontinued by cardiology.  Has discontinued Pravachol  Hypertension Asymptomatic.  Taking Norvasc 5 mg daily, Bystolic 5 mg daily and Cozaar 100 mg daily.  Type 2 diabetes Asymptomatic.  Currently takes Trulicity 1.5 mg weekly and Lantus 40 units at night.   Elevated CK Recent fall in ED.  CK was initially elevated 1869.  Repeat mildly decreased at 1790.  Her Pravachol was discontinued at that time.   PERTINENT PMH / PSH: Type 2 diabetes Hypertension Hyperlipidemia PAD with claudication On chronic anticoagulation and antiplatelets Rheumatoid arthritis Osteoarthritis   OBJECTIVE:  BP 139/80   Pulse 78   Temp 98.5 F (36.9 C) (Oral)   Ht '5\' 1"'$  (1.549 m)   Wt 145 lb (65.8 kg)   SpO2 95%   BMI 27.40 kg/m    Physical Exam Vitals reviewed.  Constitutional:      General: She is not in acute distress.    Appearance: She is not ill-appearing.  HENT:     Head: Normocephalic.     Right Ear: Tympanic membrane, ear canal and external ear normal.     Left Ear:  Tympanic membrane, ear canal and external ear normal.     Nose: Nose normal.     Mouth/Throat:     Mouth: Mucous membranes are moist.  Eyes:     Extraocular Movements: Extraocular movements intact.     Conjunctiva/sclera: Conjunctivae normal.     Pupils: Pupils are equal, round, and reactive to light.  Neck:     Vascular: No carotid bruit.  Cardiovascular:     Rate and Rhythm: Normal rate and regular rhythm.     Pulses: Normal pulses.     Heart sounds: Normal heart sounds.  Pulmonary:     Effort: Pulmonary effort is normal.     Breath sounds: Normal breath sounds.  Abdominal:     General: Bowel sounds are normal. There is no distension.     Palpations: Abdomen is soft.     Tenderness: There is no abdominal tenderness. There is no right CVA tenderness, left CVA tenderness, guarding or rebound.  Musculoskeletal:        General: Normal range of motion.     Cervical back: Normal range of motion.     Right lower leg: No edema.     Left lower leg: No edema.  Lymphadenopathy:     Cervical: No cervical adenopathy.  Skin:    Capillary Refill: Capillary refill takes less than 2 seconds.  Neurological:     General: No focal deficit present.  Mental Status: She is alert and oriented to person, place, and time. Mental status is at baseline.     Motor: No weakness.  Psychiatric:        Mood and Affect: Mood normal.        Behavior: Behavior normal.        Thought Content: Thought content normal.        Judgment: Judgment normal.     ASSESSMENT/PLAN:  Type 2 diabetes mellitus with stage 3 chronic kidney disease, unspecified whether long term insulin use, unspecified whether stage 3a or 3b CKD (HCC) -     Microalbumin / creatinine urine ratio; Future -     Empagliflozin; Take 1 tablet (10 mg total) by mouth daily before breakfast.  Peripheral vascular disease ([place]) Assessment & Plan: Chronic.  Stable.  Currently on DAPT Follows with cardiology, Dr. Tamala Julian Continue Plavix 75 mg  daily Continue Xarelto 2.5 mg twice daily Follow-up with cardiology as scheduled.   Aortic atherosclerosis (HCC) Assessment & Plan: Chronic.  Stable.  Pravachol on hold for elevated enzymes.  On ezetimibe. Continue ezetimibe 10 mg daily Will restart Pravachol pending CK results.   Pulmonary emphysema, unspecified emphysema type ([place]) Assessment & Plan: Chronic.  Stable.  Noted on CT chest 03/17/2022. Continue to monitor, if becomes symptomatic will refer to pulmonology.   DM (diabetes mellitus), type 2 with peripheral vascular complications [place]) Assessment & Plan: Chronic.  Stable.  Last A1c 7.7 at goal for age. Continue Trulicity 1.5 mg weekly Continue Lantus but administer daily, decrease dose to 30 units, plan to wean off if possible Start Jardiance 10 mg daily Continue duloxetine 30 mg daily Repeat A1c at next visit Recommend diabetic eye exam Follows with podiatry for diabetic foot exam     Hypertension associated with diabetes ([place]) Assessment & Plan: Chronic.  Stable.  Per JNC 8 guidelines at goal of less than 140/90 for age. Continue Norvasc 5 mg daily Continue Cozaar 100 mg daily Continue Bystolic 5 mg daily Continue monitoring blood pressure at home Follow-up in 3 months Strict return precautions provided.   Rheumatoid arthritis of other site, unspecified whether rheumatoid factor present [facility]) Assessment & Plan: Chronic.  Stable. Tolerating Arava 20 mg daily. Follows with rheumatology   Need for immunization against influenza -     Flu Vaccine QUAD High Dose(Fluad)  Elevated CK Assessment & Plan: Acute.  Stable.  Asymptomatic.  Noted during ED visit from 12/05. Pravachol discontinued at that time Continue to hold Pravachol Repeat CK  Orders: -     CK; Future  Lung nodules Assessment & Plan: Chronic.  Stable. Repeat CT chest 02/2023, sooner if symptomatic.    PDMP reviewed  Return in about 4 weeks (around 06/28/2022) for PCP.  Carollee Leitz, MD

## 2022-05-31 NOTE — Telephone Encounter (Signed)
Samples of this drug were given to the patient, quantity 1 , Lot Number [id number]

## 2022-06-01 ENCOUNTER — Telehealth: Payer: Self-pay

## 2022-06-01 NOTE — Telephone Encounter (Signed)
     Patient  visit on 12/5  at [facility]  Have you been able to follow up with your primary care physician? Yes   The patient was or was not able to obtain any needed medicine or equipment. Yes   Are there diet recommendations that you are having difficulty following? Na   Patient expresses understanding of discharge instructions and education provided has no other needs at this time.  Yes      [place], [facility], Care Management  [phone] [address], [place], [place] [address] Phone: [phone] Email: [email or web address]

## 2022-06-17 ENCOUNTER — Encounter: Payer: Self-pay | Admitting: Family Medicine

## 2022-06-18 ENCOUNTER — Encounter: Payer: Self-pay | Admitting: Family Medicine

## 2022-06-18 DIAGNOSIS — R748 Abnormal levels of other serum enzymes: Secondary | ICD-10-CM | POA: Insufficient documentation

## 2022-06-18 NOTE — Assessment & Plan Note (Signed)
Chronic.  Stable. Tolerating Arava 20 mg daily. Follows with rheumatology

## 2022-06-18 NOTE — Assessment & Plan Note (Addendum)
Chronic.  Stable.  Currently on DAPT Follows with cardiology, Dr. Tamala Julian Continue Plavix 75 mg daily Continue Xarelto 2.5 mg twice daily Follow-up with cardiology as scheduled.

## 2022-06-18 NOTE — Assessment & Plan Note (Signed)
Acute.  Stable.  Asymptomatic.  Noted during ED visit from 12/05. Pravachol discontinued at that time Continue to hold Pravachol Repeat CK

## 2022-06-18 NOTE — Assessment & Plan Note (Addendum)
Chronic.  Stable.  Last A1c 7.7 at goal for age. Continue Trulicity 1.5 mg weekly Continue Lantus but administer daily, decrease dose to 30 units, plan to wean off if possible Start Jardiance 10 mg daily Continue duloxetine 30 mg daily Repeat A1c at next visit Recommend diabetic eye exam Follows with podiatry for diabetic foot exam

## 2022-06-18 NOTE — Assessment & Plan Note (Signed)
Chronic.  Stable.  Per JNC 8 guidelines at goal of less than 140/90 for age. Continue Norvasc 5 mg daily Continue Cozaar 100 mg daily Continue Bystolic 5 mg daily Continue monitoring blood pressure at home Follow-up in 3 months Strict return precautions provided.

## 2022-06-18 NOTE — Assessment & Plan Note (Signed)
Chronic.  Stable. Repeat CT chest 02/2023, sooner if symptomatic.

## 2022-06-18 NOTE — Assessment & Plan Note (Signed)
Chronic.  Stable.  Pravachol on hold for elevated enzymes.  On ezetimibe. Continue ezetimibe 10 mg daily Will restart Pravachol pending CK results.

## 2022-06-18 NOTE — Assessment & Plan Note (Signed)
Chronic.  Stable.  Noted on CT chest 03/17/2022. Continue to monitor, if becomes symptomatic will refer to pulmonology.

## 2022-06-21 ENCOUNTER — Other Ambulatory Visit: Payer: Self-pay

## 2022-06-21 DIAGNOSIS — M5416 Radiculopathy, lumbar region: Secondary | ICD-10-CM

## 2022-06-21 DIAGNOSIS — M160 Bilateral primary osteoarthritis of hip: Secondary | ICD-10-CM

## 2022-06-21 DIAGNOSIS — M1990 Unspecified osteoarthritis, unspecified site: Secondary | ICD-10-CM

## 2022-06-21 DIAGNOSIS — F39 Unspecified mood [affective] disorder: Secondary | ICD-10-CM

## 2022-06-21 DIAGNOSIS — M4722 Other spondylosis with radiculopathy, cervical region: Secondary | ICD-10-CM

## 2022-06-21 NOTE — Progress Notes (Signed)
error 

## 2022-06-21 NOTE — Telephone Encounter (Signed)
I deleted the orders and re pended the orders.

## 2022-06-21 NOTE — Telephone Encounter (Signed)
Orders are pended for your review

## 2022-06-22 DIAGNOSIS — F39 Unspecified mood [affective] disorder: Secondary | ICD-10-CM | POA: Insufficient documentation

## 2022-06-22 NOTE — Progress Notes (Signed)
Referral to psychiatry for evaluation of mood disorder placed.  Patient reports that previously discussed with former PCP and would now to proceed with referral.  Referral hide patient OT/PT placed.  Patient reports now transportation and would like to continue therapy.  Carollee Leitz, MD

## 2022-06-28 NOTE — Therapy (Signed)
OUTPATIENT PHYSICAL THERAPY EVALUATION   Patient Name: Natalie Watts MRN: [id number] DOB:[birth year], 77 y.o., female Today's Date: 06/28/2022  END OF SESSION:   Past Medical History:  Diagnosis Date   Abnormal CT of the chest 03/20/2022   Abnormal gait 10/13/2021   Abnormal MRI, lumbar spine 10/22/2021   Adjustment disorder with depressed mood 07/23/2021   AKI (acute kidney injury) ([place]) 05/25/2021   Allergic conjunctivitis 10/09/2017   ANA positive 05/24/2021   Arthritis    lumbar, cervical, cervical/lumbar spondylosis   Arthritis of left hip 02/22/2022   Arthritis of left knee 12/22/2021   Bilateral hip joint arthritis 02/22/2022   Bilateral hip pain 12/22/2021   Blood clotting disorder ([place])    Bronchiectasis ([place]) 03/17/2022   CAD (coronary artery disease)    CAD (coronary artery disease) 05/17/2021   Candidiasis of skin 01/24/2017   Cardiac murmur    Carotid bruit    US carotid had 09/03/14 [facility]    Carotid bruit 06/01/2017   Carpal tunnel syndrome    Cataracts, bilateral    Cervical radiculopathy    improved since surgery 2016    Cervical radiculopathy 01/24/2017   Cervical spondylosis without myelopathy 06/05/2014   Cervicalgia 04/05/2018   Chicken pox    Chronic bursitis of left shoulder 05/27/2021   Chronic left shoulder pain 05/25/2021   Chronic pain of left knee 07/23/2021   CKD (chronic kidney disease) stage 3, GFR 30-59 ml/min ([place])    per Dr Vassie Moselle Su notes [place] noted 12/14/20   Colon polyp    12/18/14 tubulovillous adenoma high grade dysplasia Dr. Loann Quill [facility]    COVID 19+    mid 11/2020   COVID-19    01/17/22   Degenerative disk disease    Diabetes ([place])    Diabetes ([place]) 04/16/2013   Overview:   Last A1c in Oct. 6.2.  Recheck at next visit.  No medications right now. Working on increasing activity.  Next step: add back metformin at a low dose if necessary.       Overview:   Last A1c in Oct. 6.2.  Recheck at  next visit.  No medications right now. Working on increasing activity.  Next step: add back metformin at a low dose if necessary.       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      Overview:   Controlled on amlodipine.      Overview:   Overview:   Overview:   Controlled on amlodipine.    Hyperlipidemia    Hypertension    Impaired instrumental activities of daily living (IADL)  12/22/2021   Impaired mobility and ADLs 12/22/2021   Impairment of balance 01/24/2017   Incontinence of feces 12/08/2021   Insomnia    Insomnia 01/24/2017   Intertrigo    Left lumbar radiculitis 10/19/2012   Low back pain 04/05/2018   Lumbar radiculopathy    improved after steroid inj and PT 10/21/20 had right L4/5 L5-S1 transforaminal epidural steroid injections   LVH (left ventricular hypertrophy) 05/27/2021   Malignant tumor of colon ([place]) 12/26/2014   Nontraumatic complete tear of right rotator cuff 04/16/2018   Obesity 01/24/2017   Onychomycosis    Onychomycosis 05/17/2021   OSA on CPAP    dx in 2020 after hip surgery CPAP 9-13 cm H20 supplies [employer] PSG 03/18/2019 severe OSA AHI 42.1/hr titration 03/27/2019 rec APAP 9-13 cm H20    Osteoarthritis of knee 01/16/2017   PAD (peripheral artery disease) ([place])    1 x right, 3 x left in 2020-2022 needs bypass on leg had CTA with run off 11/2020 [place]   PAD (peripheral artery disease) (HCC)    with stents   PAD (peripheral artery disease) ([place]) 03/10/2016   B/l stents placed while living in [place] then moved to [place] and vascular [place] Dr. Delbert Phenix in [place]  Note 07/27/20 ABI right 0.82 and left 0.35      CTA with runoff severe calcified plaque prox SFA multifocal moderate to severe areas of calcified plaque right popliteal artery with associated moderate to severe areas of stenosis moderate to severe focal areas of stenosis in the tibioperoneal t   Peripheral vascular disease ([place])    Primary osteoarthritis of first carpometacarpal joint of left hand 06/05/2014   Right foot drop    since 2020   Right foot drop 05/17/2021   Rotator cuff tendinitis, right 04/16/2018   Screening for colorectal cancer 11/30/2014   Shoulder pain, right 05/08/2018   Spinal stenosis of lumbar region 10/22/2021   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/2012   Spinal stenosis, lumbar 10/14/2021   Subdeltoid bursitis    Synovitis of ankle 12/15/2016   Thoracic arthritis 03/17/2022   Thoracic spinal stenosis 12/08/2021   Tinea pedis    Tubular adenoma of colon 05/25/2021   Vitamin D deficiency    Past Surgical History:  Procedure Laterality Date   ABDOMINAL AORTOGRAM W/LOWER EXTREMITY N/A 06/01/2021   Procedure: ABDOMINAL AORTOGRAM W/LOWER EXTREMITY;  Surgeon: Serafina Mitchell, MD;  Location: [facility];  Service: Cardiovascular;  Laterality: N/A;   BACK SURGERY     cervical laminoplasty C3-C7 Rex Dr. 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 Edwena Bunde [place] 12 mm d colon polyp, grade 1 IH, moderate diverticulosis tubular    ESOPHAGOGASTRODUODENOSCOPY  05/01/2020   erosive gastritis Dr. Tilford Pillar [place]   PERIPHERAL VASCULAR INTERVENTION  06/01/2021   Procedure: PERIPHERAL VASCULAR INTERVENTION;  Surgeon: Serafina Mitchell, MD;  Location: [facility];  Service: Cardiovascular;;  RT. SFA   right total hip Right    03/05/2019 or 02/26/2020   TUBAL LIGATION     UPPER GI ENDOSCOPY     05/01/20   VASCULAR SURGERY     08/19/19   VASCULAR SURGERY     07/05/19   VASCULAR SURGERY     05/21/2019 angioplasty since 05/2019, 06/2019 and 08/2019 had 2 left leg and 1  on right leg   Patient Active Problem List   Diagnosis Date Noted   Mood disorder ([place]) 06/22/2022   Elevated CK 06/18/2022   Emphysema lung ([place]) 03/17/2022   Lung nodules 03/17/2022   Rheumatoid arthritis ([place]) 10/13/2021   Bilateral hearing loss 07/23/2021   Hypertension associated with diabetes ([place]) 05/25/2021   Glaucoma of both eyes 05/25/2021   OSA on CPAP 05/17/2021   Aortic atherosclerosis ([place]) 05/17/2021   Vitamin B12 deficiency 06/01/2017   Arthritis 01/24/2017   Carpal tunnel syndrome 01/24/2017   Hyperlipidemia 01/24/2017   Lumbar radiculopathy 01/24/2017   Peripheral vascular disease ([place]) 01/24/2017   Vitamin D deficiency 01/24/2017   GERD (gastroesophageal reflux disease) 11/30/2014   CKD (chronic kidney disease) stage 3, GFR 30-59 ml/min (HCC) 11/07/2014   Cervical spondylosis with radiculopathy 06/05/2014   DM (diabetes mellitus), type 2 with peripheral vascular complications ([place]) 58/30/9407    PCP: Carollee Leitz, MD  REFERRING PROVIDER: Carollee Leitz, MD  REFERRING DIAG: M54.16 (ICD-10-CM) - Lumbar radiculopathy  Rationale for Evaluation and Treatment: Rehabilitation  THERAPY DIAG:  No diagnosis found.  ONSET DATE: ***  SUBJECTIVE:                                                                                                                                                                                            SUBJECTIVE STATEMENT: ***  PERTINENT HISTORY:  Arthritis multiple joints, CAD, cervical surgery 2016, CKD, DDD, DM, history of DVT, GERD, Gout, HTN, Hyperlipidemia, PAD, PVD  PAIN:  NPRS scale: ***/10 Pain location: *** Pain description: *** Aggravating factors: *** Relieving factors: ***  PRECAUTIONS: {Therapy precautions:24002}  WEIGHT BEARING RESTRICTIONS: {Yes ***/No:24003}  FALLS:  Has patient fallen in last 6 months? {fallsyesno:27318}  LIVING ENVIRONMENT: Lives with: {OPRC lives with:25569::"lives with their family"} Lives in: {Lives in:25570} Stairs: {opstairs:27293} Has following equipment at home: {Assistive devices:23999}  OCCUPATION: ***  PLOF: Independent  PATIENT GOALS: ***  Next MD Visit:    OBJECTIVE:   DIAGNOSTIC FINDINGS:  ***  PATIENT SURVEYS:  FOTO eval:    predicted:    SCREENING FOR RED FLAGS: Bowel or bladder incontinence: {Yes/No:304960894} Cauda equina syndrome: {Yes/No:304960894}  COGNITION: Overall cognitive status: WFL normal      SENSATION: {sensation:27233}  MUSCLE LENGTH: Hamstrings: Right *** deg; Left *** deg Thomas test: Right *** deg; Left *** deg  POSTURE: {posture:25561}  PALPATION: ***  LUMBAR ROM:   AROM eval  Flexion   Extension   Right lateral flexion   Left lateral flexion   Right rotation   Left rotation    (Blank rows = not tested)  LOWER EXTREMITY ROM:     {  AROM/PROM:27142}  Right eval Left eval  Hip flexion    Hip extension    Hip abduction    Hip adduction    Hip internal rotation    Hip external rotation    Knee flexion    Knee extension    Ankle dorsiflexion    Ankle plantarflexion    Ankle inversion    Ankle eversion     (Blank rows = not tested)  LOWER EXTREMITY MMT:    MMT Right eval Left eval  Hip flexion    Hip extension    Hip abduction    Hip adduction    Hip internal rotation    Hip external rotation    Knee flexion    Knee extension    Ankle  dorsiflexion    Ankle plantarflexion    Ankle inversion    Ankle eversion     (Blank rows = not tested)  LUMBAR SPECIAL TESTS:  {lumbar special test:25242}  FUNCTIONAL TESTS:  {Functional tests:24029}  GAIT: Distance walked: *** Assistive device utilized: {Assistive devices:23999} Level of assistance: {Levels of assistance:24026} Comments: ***  TODAY'S TREATMENT:                                                                                                                              DATE: ***  Therex:    HEP instruction/performance c cues for techniques, handout provided.  Trial set performed of each for comprehension and symptom assessment.  See below for exercise list  PATIENT EDUCATION:  Education details: HEP, POC Person educated: Patient Education method: Explanation, Demonstration, Verbal cues, and Handouts Education comprehension: verbalized understanding, returned demonstration, and verbal cues required  HOME EXERCISE PROGRAM: ***  ASSESSMENT:  CLINICAL IMPRESSION: Patient is a 77 y.o. who comes to clinic with complaints of ***pain with mobility, strength and movement coordination deficits that impair their ability to perform usual daily and recreational functional activities without increase difficulty/symptoms at this time.  Patient to benefit from skilled PT services to address impairments and limitations to improve to previous level of function without restriction secondary to condition.   OBJECTIVE IMPAIRMENTS: {opptimpairments:25111}.   ACTIVITY LIMITATIONS: {activitylimitations:27494}  PARTICIPATION LIMITATIONS: {participationrestrictions:25113}  PERSONAL FACTORS: {Personal factors:25162} are also affecting patient's functional outcome.   REHAB POTENTIAL: {rehabpotential:25112}  CLINICAL DECISION MAKING: {clinical decision making:25114}  EVALUATION COMPLEXITY: {Evaluation complexity:25115}   GOALS: Goals reviewed with patient? Yes  SHORT TERM  GOALS: (target date for Short term goals are 3 weeks ***)  1. Patient will demonstrate independent use of home exercise program to maintain progress from in clinic treatments.  Goal status: New  LONG TERM GOALS: (target dates for all long term goals are 10 weeks  *** )   1. Patient will demonstrate/report pain at worst less than or equal to 2/10 to facilitate minimal limitation in daily activity secondary to pain symptoms.  Goal status: New   2. Patient will demonstrate independent use of home exercise program to facilitate ability  to maintain/progress functional gains from skilled physical therapy services.  Goal status: New   3. Patient will demonstrate FOTO outcome > or = *** % to indicate reduced disability due to condition.  Goal status: New   4. Patient will demonstrate lumbar extension 100 % WFL s symptoms to facilitate upright standing, walking posture at PLOF s limitation.  Goal status: New   5.  ***  Goal status: New   6.  *** Goal status: New   7.  *** Goal Status: New  PLAN:  PT FREQUENCY: 1-2x/week  PT DURATION: 10 weeks  PLANNED INTERVENTIONS: Therapeutic exercises, Therapeutic activity, Neuro Muscular re-education, Balance training, Gait training, Patient/Family education, Joint mobilization, Stair training, DME instructions, Dry Needling, Electrical stimulation, Cryotherapy, vasopneumatic device, Moist heat, Taping, Traction Ultrasound, Ionotophoresis '4mg'$ /ml Dexamethasone, and Manual therapy.  All included unless contraindicated  PLAN FOR NEXT SESSION: Review HEP knowledge/results.

## 2022-06-29 ENCOUNTER — Ambulatory Visit: Payer: Medicare PPO | Admitting: Rehabilitative and Restorative Service Providers"

## 2022-06-29 ENCOUNTER — Other Ambulatory Visit: Payer: Self-pay

## 2022-06-29 ENCOUNTER — Encounter: Payer: Self-pay | Admitting: Rehabilitative and Restorative Service Providers"

## 2022-06-29 DIAGNOSIS — M5459 Other low back pain: Secondary | ICD-10-CM

## 2022-06-29 DIAGNOSIS — M25561 Pain in right knee: Secondary | ICD-10-CM | POA: Diagnosis not present

## 2022-06-29 DIAGNOSIS — M5416 Radiculopathy, lumbar region: Secondary | ICD-10-CM | POA: Diagnosis not present

## 2022-06-29 DIAGNOSIS — M25562 Pain in left knee: Secondary | ICD-10-CM | POA: Diagnosis not present

## 2022-06-29 DIAGNOSIS — G8929 Other chronic pain: Secondary | ICD-10-CM

## 2022-06-29 DIAGNOSIS — R293 Abnormal posture: Secondary | ICD-10-CM

## 2022-06-29 DIAGNOSIS — M6281 Muscle weakness (generalized): Secondary | ICD-10-CM

## 2022-07-04 ENCOUNTER — Ambulatory Visit: Payer: Medicare PPO | Admitting: Physical Therapy

## 2022-07-04 ENCOUNTER — Encounter: Payer: Self-pay | Admitting: Physical Therapy

## 2022-07-04 DIAGNOSIS — M5459 Other low back pain: Secondary | ICD-10-CM | POA: Diagnosis not present

## 2022-07-04 DIAGNOSIS — M6281 Muscle weakness (generalized): Secondary | ICD-10-CM

## 2022-07-04 DIAGNOSIS — M25561 Pain in right knee: Secondary | ICD-10-CM | POA: Diagnosis not present

## 2022-07-04 DIAGNOSIS — G8929 Other chronic pain: Secondary | ICD-10-CM

## 2022-07-04 DIAGNOSIS — M25562 Pain in left knee: Secondary | ICD-10-CM | POA: Diagnosis not present

## 2022-07-04 DIAGNOSIS — M5416 Radiculopathy, lumbar region: Secondary | ICD-10-CM | POA: Diagnosis not present

## 2022-07-04 DIAGNOSIS — R293 Abnormal posture: Secondary | ICD-10-CM

## 2022-07-04 NOTE — Therapy (Signed)
OUTPATIENT PHYSICAL THERAPY TREATMENT NOTE   Patient Name: Natalie Watts MRN: [id number] DOB:06-Feb-1946, 77 y.o., female Today's Date: 07/04/2022  END OF SESSION:   PT End of Session - 07/04/22 1408     Visit Number 2    Number of Visits 24    Date for PT Re-Evaluation 09/21/22    Authorization Type [employer] $20 copay    Authorization - Number of Visits 12    PT Start Time 1350    PT Stop Time 1428    PT Time Calculation (min) 38 min    Activity Tolerance Patient limited by pain    Behavior During Therapy [facility] for tasks assessed/performed             Past Medical History:  Diagnosis Date   Abnormal CT of the chest 03/20/2022   Abnormal gait 10/13/2021   Abnormal MRI, lumbar spine 10/22/2021   Adjustment disorder with depressed mood 07/23/2021   AKI (acute kidney injury) ([employer]) 05/25/2021   Allergic conjunctivitis 10/09/2017   ANA positive 05/24/2021   Arthritis    lumbar, cervical, cervical/lumbar spondylosis   Arthritis of left hip 02/22/2022   Arthritis of left knee 12/22/2021   Bilateral hip joint arthritis 02/22/2022   Bilateral hip pain 12/22/2021   Blood clotting disorder ([place])    Bronchiectasis ([place]) 03/17/2022   CAD (coronary artery disease)    CAD (coronary artery disease) 05/17/2021   Candidiasis of skin 01/24/2017   Cardiac murmur    Carotid bruit    US carotid had 09/03/14 [facility]    Carotid bruit 06/01/2017   Carpal tunnel syndrome    Cataracts, bilateral    Cervical radiculopathy    improved since surgery 2016    Cervical radiculopathy 01/24/2017   Cervical spondylosis without myelopathy 06/05/2014   Cervicalgia 04/05/2018   Chicken pox    Chronic bursitis of left shoulder 05/27/2021   Chronic left shoulder pain 05/25/2021   Chronic pain of left knee 07/23/2021   CKD (chronic kidney disease) stage 3, GFR 30-59 ml/min (HCC)    per Dr Vassie Moselle Su notes [place] noted 12/14/20   Colon polyp    12/18/14 tubulovillous adenoma  high grade dysplasia Dr. Loann Quill [place]    COVID 19+    mid 11/2020   COVID-19    01/17/22   Degenerative disk disease    Diabetes ([place])    Diabetes ([place]) 04/16/2013   Overview:   Last A1c in Oct. 6.2.  Recheck at next visit.  No medications right now. Working on increasing activity.  Next step: add back metformin at a low dose if necessary.       Overview:   Last A1c in Oct. 6.2.  Recheck at next visit.  No medications right now. Working on increasing activity.  Next step: add back metformin at a low dose if necessary.       Overview:   Overview:   Overview:      DVT (deep venous thrombosis) (HCC)    DVT, lower extremity ([place])    left, 05/2013 on Xarelto x 1 month etiology unkown    Dysplastic polyp of colon 12/25/2014   Gait abnormality 12/08/2021   Gallstones 05/17/2021   Gastroesophageal reflux disease without esophagitis 01/24/2017   Generalized osteoarthritis 01/24/2017   Genital herpes simplex type 2 01/24/2017   GERD (gastroesophageal reflux disease)    Gout    Gout 05/17/2021   H/O myocardial perfusion scan    06/08/20 low risk  Hammer toes, bilateral    Healthcare maintenance 04/16/2013   Overview:   Last flu shot :04/15/13   Heart murmur 06/16/2017   Hiatal hernia 05/17/2021   History of degenerative disc disease 06/01/2017   HSV-2 infection    HTN (hypertension) 04/16/2013   Overview:   Controlled on amlodipine.      Overview:   Controlled on amlodipine.      Overview:   Overview:   Overview:   Controlled on amlodipine.    Hyperlipidemia    Hypertension    Impaired instrumental activities of daily living (IADL) 12/22/2021   Impaired mobility and ADLs 12/22/2021   Impairment of balance 01/24/2017   Incontinence of feces 12/08/2021   Insomnia    Insomnia 01/24/2017   Intertrigo    Left lumbar radiculitis 10/19/2012   Low back pain 04/05/2018   Lumbar radiculopathy    improved after steroid inj and PT 10/21/20 had right L4/5 L5-S1 transforaminal epidural  steroid injections   LVH (left ventricular hypertrophy) 05/27/2021   Malignant tumor of colon ([place]) 12/26/2014   Nontraumatic complete tear of right rotator cuff 04/16/2018   Obesity 01/24/2017   Onychomycosis    Onychomycosis 05/17/2021   OSA on CPAP    dx in 2020 after hip surgery CPAP 9-13 cm H20 supplies [employer] PSG 03/18/2019 severe OSA AHI 42.1/hr titration 03/27/2019 rec APAP 9-13 cm H20   Osteoarthritis of knee 01/16/2017   PAD (peripheral artery disease) ([place])    1 x right, 3 x left in 2020-2022 needs bypass on leg had CTA with run off 11/2020 in [place]   PAD (peripheral artery disease) (HCC)    with stents   PAD (peripheral artery disease) ([place]) 03/10/2016   B/l stents placed while living in [place] then moved to [place] and vascular MD Dr. Delbert Phenix in [place]  Note 07/27/20 ABI right 0.82 and left 0.35      CTA with runoff severe calcified plaque prox SFA multifocal moderate to severe areas of calcified plaque right popliteal artery with associated moderate to severe areas of stenosis moderate to severe focal areas of stenosis in the tibioperoneal t   Peripheral vascular disease ([place])    Primary osteoarthritis of first carpometacarpal joint of left hand 06/05/2014   Right foot drop    since 2020   Right foot drop 05/17/2021   Rotator cuff tendinitis, right 04/16/2018   Screening for colorectal cancer 11/30/2014   Shoulder pain, right 05/08/2018   Spinal stenosis of lumbar region 10/22/2021   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/2012   Spinal stenosis, lumbar 10/14/2021   Subdeltoid bursitis    Synovitis of ankle 12/15/2016   Thoracic arthritis 03/17/2022   Thoracic spinal stenosis 12/08/2021   Tinea pedis    Tubular adenoma of colon 05/25/2021   Vitamin D deficiency    Past Surgical History:  Procedure Laterality Date   ABDOMINAL AORTOGRAM W/LOWER EXTREMITY N/A 06/01/2021   Procedure: ABDOMINAL AORTOGRAM W/LOWER EXTREMITY;  Surgeon: Serafina Mitchell, MD;  Location: [facility];  Service: Cardiovascular;  Laterality: N/A;   BACK SURGERY     cervical laminoplasty C3-C7 Rex Dr. 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 Edwena Bunde [place] 12 mm d colon polyp, grade 1 IH, moderate diverticulosis tubular   ESOPHAGOGASTRODUODENOSCOPY  05/01/2020   erosive gastritis Dr. Edwena Bunde Adeniji  [place]   PERIPHERAL VASCULAR INTERVENTION  06/01/2021   Procedure: PERIPHERAL VASCULAR INTERVENTION;  Surgeon: Serafina Mitchell, MD;  Location: [place] [facility];  Service: Cardiovascular;;  RT. SFA   right total hip Right    03/05/2019 or 02/26/2020   TUBAL LIGATION     UPPER GI ENDOSCOPY     05/01/20   VASCULAR SURGERY     08/19/19   VASCULAR SURGERY     07/05/19   VASCULAR SURGERY     05/21/2019 angioplasty since 05/2019, 06/2019 and 08/2019 had 2 left leg and 1 on right leg   Patient Active Problem List   Diagnosis Date Noted   Mood disorder ([place]) 06/22/2022   Elevated CK 06/18/2022   Emphysema lung ([place]) 03/17/2022   Lung nodules 03/17/2022   Rheumatoid arthritis ([place]) 10/13/2021   Bilateral hearing loss 07/23/2021   Hypertension associated with diabetes ([place]) 05/25/2021   Glaucoma of both eyes 05/25/2021   OSA on CPAP 05/17/2021   Aortic atherosclerosis ([place]) 05/17/2021   Vitamin B12 deficiency 06/01/2017   Arthritis 01/24/2017   Carpal tunnel syndrome 01/24/2017   Hyperlipidemia 01/24/2017   Lumbar radiculopathy 01/24/2017   Peripheral vascular disease ([place]) 01/24/2017   Vitamin D deficiency 01/24/2017   GERD (gastroesophageal reflux disease) 11/30/2014   CKD (chronic kidney disease) stage 3, GFR 30-59 ml/min (HCC) 11/07/2014   Cervical spondylosis with radiculopathy 06/05/2014   DM (diabetes mellitus), type 2 with peripheral vascular complications ([place]) 25/00/3704     THERAPY DIAG:  Other low back  pain  Radiculopathy, lumbar region  Chronic pain of left knee  Chronic pain of right knee  Muscle weakness (generalized)  Abnormal posture   PCP: Carollee Leitz, [place]  REFERRING PROVIDER: Carollee Leitz, MD  REFERRING DIAG: M54.16 (ICD-10-CM) - Lumbar radiculopathy  Rationale for Evaluation and Treatment: Rehabilitation  EVAL THERAPY DIAG:  Other low back pain  Radiculopathy, lumbar region  Chronic pain of left knee  Chronic pain of right knee  Muscle weakness (generalized)  Abnormal posture  ONSET DATE: 06/2021  SUBJECTIVE:                                                                                                                                                                                           SUBJECTIVE STATEMENT: Doing okay today, pain is "no more than usual."  Reports she's doing her HEP as able  Pt indicated she was using a cane to walk but has now had to use FWW to perform ambulation.  She indicated she has just progressively gotten worse with time with difficulty walking, balance trouble.   Pt indicated having back  pain off and on.  She feels like overexertion hurts back but knees have reduced activity.  She ultimately just doesn't feel like she can do things without her knees hurting.   She reported her knees hurt to touch with tight jeans and sheets at times.   Arrived with daughter today but she didn't come back for evaluation.     PERTINENT HISTORY:  Arthritis multiple joints, CAD, cervical surgery 2016, CKD, DDD, DM, history of DVT, GERD, Gout, HTN, Hyperlipidemia, PAD, PVD. History of hip replacement  PAIN:  NPRS scale: moderate to severe at worst  (no specific pain number was given) Pain location:  back, bilateral knees Pain description: ache, "hurts" Aggravating factors: standing, walking movements, transfers, picking up/lifting Relieving factors: rest, tylenol  PRECAUTIONS: None  WEIGHT BEARING RESTRICTIONS: No  FALLS:  Has patient  fallen in last 6 months? 2 Some fear of falling reported  LIVING ENVIRONMENT: Lives in: House/apartment Stairs:  ASK NEXT VISIT Has following equipment at home: FWW, [place]: Retired  PLOF: Independent  PATIENT GOALS: Help pain, move better.    OBJECTIVE:   PATIENT SURVEYS:  06/29/2022 FOTO eval: 16   predicted:  33  SCREENING FOR RED FLAGS: 06/29/2022 Bowel or bladder incontinence: No Cauda equina syndrome: No  COGNITION: 06/29/2022 Overall cognitive status: WFL normal      SENSATION: 06/29/2022 No specific testing today  MUSCLE LENGTH: 06/29/2022 No specific testing today but visual check in standing posture showed possibility for bilateral hip flexor tightness  POSTURE:  06/29/2022  FHP, rounded shoulders, forward trunk lean, reduced lumbar lordosis, wider base of support.  Difficulty to perform independently without balance support.   LUMBAR ROM:   AROM 06/29/2022  Flexion   Extension Unable to obtain neutral   Right lateral flexion   Left lateral flexion   Right rotation   Left rotation    (Blank rows = not tested)   LOWER EXTREMITY MMT:    MMT Right 06/29/2022 Left 06/29/2022  Hip flexion 4/5 4/5  Hip extension    Hip abduction Seated 2+/5 Seated 2+/5  Hip adduction    Hip internal rotation    Hip external rotation    Knee flexion 4/5 4/5  Knee extension 4/5 4/5  Ankle dorsiflexion 2/5 5/5  Ankle plantarflexion    Ankle inversion    Ankle eversion     (Blank rows = not tested)  LUMBAR SPECIAL TESTS:  06/29/2022 No specific testing today due to time.  FUNCTIONAL TESTS:  06/29/2022 Unable to perform sit to stand from 18 inch chair s UE assist and multiple tries/additional time.  Pivot transfer from chair to chair required supervision and additional time.   07/04/22 Timed up and go (TUG): 28.97 sec with RW   GAIT: 06/29/2022 Distance walked: 80 ft Assistive device utilized: FWW Level of assistance: SBA Comments: Posture from  standing (listed above) similar in ambulation  Used wheelchair to enter and move within clinic prior to visit and after visit.   TODAY'S TREATMENT:  DATE: 07/04/2022  TherEx: NuStep L4 x 8 min; UE/LE Seated scapular retraction 10 x 5 sec hold Hip adduction isometric (seated ball squeeze) 10 x 5 sec hol Hip abduction seated with L3 band x10 reps Sit to/from stand x 5 reps with UE support and RW in front of pt  Physical Performance Test TUG performed - see above for results; reviewed with pt   TODAY'S TREATMENT:                                                                                                    DATE: 06/29/2022  Therex:    HEP instruction/performance c cues for techniques, handout provided.  Trial set performed of each for comprehension and symptom assessment.  See below for exercise list  PATIENT EDUCATION:  06/29/2022 Education details: HEP, POC Person educated: Patient Education method: Explanation, Demonstration, Verbal cues, and Handouts Education comprehension: verbalized understanding, returned demonstration, and verbal cues required  HOME EXERCISE PROGRAM: Access Code: [email or web address] URL: [email or web address] Date: 06/29/2022 Prepared by: Scot Jun  Exercises - Seated Scapular Retraction  - 1-2 x daily - 7 x weekly - 1 sets - 10 reps - 3-5 hold - Seated Hip Adduction Squeeze with Ball  - 1-2 x daily - 7 x weekly - 1 sets - 10 reps - 5 hold - Seated Hip Abduction  - 1-2 x daily - 7 x weekly - 1-2 sets - 10 reps - Sit to Stand with Counter Support  - 3 x daily - 7 x weekly - 1 sets - 5-10 reps  ASSESSMENT:  CLINICAL IMPRESSION: Pt tolerated session well today needing min cues for review of HEP today.  TUG time indicates pt is fall risk.  Will continue to benefit from PT to maximize function.  OBJECTIVE IMPAIRMENTS: Abnormal gait, decreased  activity tolerance, decreased balance, decreased coordination, decreased endurance, decreased mobility, difficulty walking, decreased ROM, decreased strength, hypomobility, increased fascial restrictions, impaired perceived functional ability, impaired flexibility, improper body mechanics, postural dysfunction, and pain.   ACTIVITY LIMITATIONS: carrying, lifting, bending, standing, squatting, stairs, transfers, toileting, dressing, reach over head, hygiene/grooming, and locomotion level  PARTICIPATION LIMITATIONS: meal prep, cleaning, laundry, driving, community activity, and occupation  PERSONAL FACTORS: Arthritis multiple joints, CAD, cervical surgery 2016, CKD, DDD, DM, history of DVT, GERD, Gout, HTN, Hyperlipidemia, PAD, PVD, length of time since onset are also affecting patient's functional outcome.   REHAB POTENTIAL: Good  CLINICAL DECISION MAKING: Evolving/moderate complexity  EVALUATION COMPLEXITY: Moderate   GOALS: Goals reviewed with patient? Yes  SHORT TERM GOALS: (target date for Short term goals are 3 weeks 07/20/2022)  1. Patient will demonstrate independent use of home exercise program to maintain progress from in clinic treatments.  Goal status: New  LONG TERM GOALS: (target dates for all long term goals are 12 weeks  09/21/2022 )   1. Patient will demonstrate/report pain at worst less than or equal to 2/10 to facilitate minimal limitation in daily activity secondary to pain symptoms.  Goal status: New   2. Patient will demonstrate independent use of home exercise program to facilitate ability to maintain/progress  functional gains from skilled physical therapy services.  Goal status: New   3. Patient will demonstrate FOTO outcome > or = 33 % to indicate reduced disability due to condition.  Goal status: New   4. Patient will demonstrate lumbar extension to neutral s symptoms to facilitate upright standing, walking posture at PLOF s limitation.  Goal status: New    5.  Patient will demonstrate/report ability to walk community distances 300 ft or greater with LRAD independent for community integration.   Goal status: New   6.  Patient will demonstrate TUG < 13 seconds to reduce fall risk.  Goal status: New   7.  Patient will demonstrate bilateral knee MMT 5/5, hip flexion 5/5, hip abduction > or = 4/5 to facilitate ability to move within house and community.  Goal Status: New  PLAN:  PT FREQUENCY: 1-2x/week  PT DURATION: 10 weeks  PLANNED INTERVENTIONS: Therapeutic exercises, Therapeutic activity, Neuro Muscular re-education, Balance training, Gait training, Patient/Family education, Joint mobilization, Stair training, DME instructions, Dry Needling, Electrical stimulation, Cryotherapy, vasopneumatic device, Moist heat, Taping, Traction Ultrasound, Ionotophoresis '4mg'$ /ml Dexamethasone, and Manual therapy.  All included unless contraindicated  PLAN FOR NEXT SESSION: Review HEP PRN.    Proceed with strengthening/mobility improvements.    Laureen Abrahams, PT, DPT 07/04/22 2:31 PM

## 2022-07-06 ENCOUNTER — Ambulatory Visit: Payer: Medicare PPO | Admitting: Rehabilitative and Restorative Service Providers"

## 2022-07-06 ENCOUNTER — Encounter: Payer: Self-pay | Admitting: Rehabilitative and Restorative Service Providers"

## 2022-07-06 DIAGNOSIS — M25561 Pain in right knee: Secondary | ICD-10-CM | POA: Diagnosis not present

## 2022-07-06 DIAGNOSIS — M5416 Radiculopathy, lumbar region: Secondary | ICD-10-CM | POA: Diagnosis not present

## 2022-07-06 DIAGNOSIS — M5459 Other low back pain: Secondary | ICD-10-CM

## 2022-07-06 DIAGNOSIS — G8929 Other chronic pain: Secondary | ICD-10-CM

## 2022-07-06 DIAGNOSIS — M6281 Muscle weakness (generalized): Secondary | ICD-10-CM

## 2022-07-06 DIAGNOSIS — M25562 Pain in left knee: Secondary | ICD-10-CM

## 2022-07-06 DIAGNOSIS — R293 Abnormal posture: Secondary | ICD-10-CM

## 2022-07-06 NOTE — Therapy (Signed)
OUTPATIENT PHYSICAL THERAPY TREATMENT NOTE   Patient Name: Natalie Watts MRN: [id number] DOB:08-06-1945, 77 y.o., female Today's Date: 07/06/2022  END OF SESSION:   PT End of Session - 07/06/22 1338     Visit Number 3    Number of Visits 24    Date for PT Re-Evaluation 09/21/22    Authorization Type [employer] $20 copay    Authorization - Visit Number 3    Authorization - Number of Visits 12    PT Start Time 9735    PT Stop Time 3299    PT Time Calculation (min) 39 min    Equipment Utilized During Treatment Gait belt    Activity Tolerance Patient tolerated treatment well    Behavior During Therapy WFL for tasks assessed/performed              Past Medical History:  Diagnosis Date   Abnormal CT of the chest 03/20/2022   Abnormal gait 10/13/2021   Abnormal MRI, lumbar spine 10/22/2021   Adjustment disorder with depressed mood 07/23/2021   AKI (acute kidney injury) ([place]) 05/25/2021   Allergic conjunctivitis 10/09/2017   ANA positive 05/24/2021   Arthritis    lumbar, cervical, cervical/lumbar spondylosis   Arthritis of left hip 02/22/2022   Arthritis of left knee 12/22/2021   Bilateral hip joint arthritis 02/22/2022   Bilateral hip pain 12/22/2021   Blood clotting disorder ([place])    Bronchiectasis ([place]) 03/17/2022   CAD (coronary artery disease)    CAD (coronary artery disease) 05/17/2021   Candidiasis of skin 01/24/2017   Cardiac murmur    Carotid bruit    US carotid had 09/03/14 [facility]    Carotid bruit 06/01/2017   Carpal tunnel syndrome    Cataracts, bilateral    Cervical radiculopathy    improved since surgery 2016    Cervical radiculopathy 01/24/2017   Cervical spondylosis without myelopathy 06/05/2014   Cervicalgia 04/05/2018   Chicken pox    Chronic bursitis of left shoulder 05/27/2021   Chronic left shoulder pain 05/25/2021   Chronic pain of left knee 07/23/2021   CKD (chronic kidney disease) stage 3, GFR 30-59 ml/min ([place])    per  Dr Vassie Moselle Su notes [place] noted 12/14/20   Colon polyp    12/18/14 tubulovillous adenoma high grade dysplasia Dr. Loann Quill [facility]    COVID 19+    mid 11/2020   COVID-19    01/17/22   Degenerative disk disease    Diabetes ([place])    Diabetes ([place]) 04/16/2013   Overview:   Last A1c in Oct. 6.2.  Recheck at next visit.  No medications right now. Working on increasing activity.  Next step: add back metformin at a low dose if necessary.       Overview:   Last A1c in Oct. 6.2.  Recheck at next visit.  No medications right now. Working on increasing activity.  Next step: add back metformin at a low dose if necessary.       Overview:   Overview:   Overview:      DVT (deep venous thrombosis) (HCC)    DVT, lower extremity ([place])    left, 05/2013 on Xarelto x 1 month etiology unkown    Dysplastic polyp of colon 12/25/2014   Gait abnormality 12/08/2021   Gallstones 05/17/2021   Gastroesophageal reflux disease without esophagitis 01/24/2017   Generalized osteoarthritis 01/24/2017   Genital herpes simplex type 2 01/24/2017   GERD (gastroesophageal reflux disease)    Gout  Gout 05/17/2021   H/O myocardial perfusion scan    06/08/20 low risk   Hammer toes, bilateral    Healthcare maintenance 04/16/2013   Overview:   Last flu shot :04/15/13   Heart murmur 06/16/2017   Hiatal hernia 05/17/2021   History of degenerative disc disease 06/01/2017   HSV-2 infection    HTN (hypertension) 04/16/2013   Overview:   Controlled on amlodipine.      Overview:   Controlled on amlodipine.      Overview:   Overview:   Overview:   Controlled on amlodipine.    Hyperlipidemia    Hypertension    Impaired instrumental activities of daily living (IADL) 12/22/2021   Impaired mobility and ADLs 12/22/2021   Impairment of balance 01/24/2017   Incontinence of feces 12/08/2021   Insomnia    Insomnia 01/24/2017   Intertrigo    Left lumbar radiculitis 10/19/2012   Low back pain 04/05/2018   Lumbar  radiculopathy    improved after steroid inj and PT 10/21/20 had right L4/5 L5-S1 transforaminal epidural steroid injections   LVH (left ventricular hypertrophy) 05/27/2021   Malignant tumor of colon ([place]) 12/26/2014   Nontraumatic complete tear of right rotator cuff 04/16/2018   Obesity 01/24/2017   Onychomycosis    Onychomycosis 05/17/2021   OSA on CPAP    dx in 2020 after hip surgery CPAP 9-13 cm H20 supplies [employer] PSG 03/18/2019 severe OSA AHI 42.1/hr titration 03/27/2019 rec APAP 9-13 cm H20   Osteoarthritis of knee 01/16/2017   PAD (peripheral artery disease) ([place])    1 x right, 3 x left in 2020-2022 needs bypass on leg had CTA with run off 11/2020 [place]   PAD (peripheral artery disease) (HCC)    with stents   PAD (peripheral artery disease) ([place]) 03/10/2016   B/l stents placed while living in [place] then moved to [place] and vascular [place] Dr. Delbert Phenix in [place]  Note 07/27/20 ABI right 0.82 and left 0.35      CTA with runoff severe calcified plaque prox SFA multifocal moderate to severe areas of calcified plaque right popliteal artery with associated moderate to severe areas of stenosis moderate to severe focal areas of stenosis in the tibioperoneal t   Peripheral vascular disease ([place])    Primary osteoarthritis of first carpometacarpal joint of left hand 06/05/2014   Right foot drop    since 2020   Right foot drop 05/17/2021   Rotator cuff tendinitis, right 04/16/2018   Screening for colorectal cancer 11/30/2014   Shoulder pain, right 05/08/2018   Spinal stenosis of lumbar region 10/22/2021   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/2012   Spinal stenosis, lumbar 10/14/2021   Subdeltoid bursitis    Synovitis of ankle 12/15/2016   Thoracic arthritis 03/17/2022   Thoracic spinal stenosis 12/08/2021   Tinea pedis    Tubular adenoma of colon 05/25/2021   Vitamin D deficiency    Past Surgical History:  Procedure Laterality Date   ABDOMINAL AORTOGRAM  W/LOWER EXTREMITY N/A 06/01/2021   Procedure: ABDOMINAL AORTOGRAM W/LOWER EXTREMITY;  Surgeon: Serafina Mitchell, MD;  Location: [facility];  Service: Cardiovascular;  Laterality: N/A;   BACK SURGERY     cervical laminoplasty C3-C7 Rex Dr. 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 Edwena Bunde [place] 12 mm d colon polyp, grade 1  IH, moderate diverticulosis tubular   ESOPHAGOGASTRODUODENOSCOPY  05/01/2020   erosive gastritis Dr. Tilford Pillar [place]   PERIPHERAL VASCULAR INTERVENTION  06/01/2021   Procedure: PERIPHERAL VASCULAR INTERVENTION;  Surgeon: Serafina Mitchell, MD;  Location: [facility];  Service: Cardiovascular;;  RT. SFA   right total hip Right    03/05/2019 or 02/26/2020   TUBAL LIGATION     UPPER GI ENDOSCOPY     05/01/20   VASCULAR SURGERY     08/19/19   VASCULAR SURGERY     07/05/19   VASCULAR SURGERY     05/21/2019 angioplasty since 05/2019, 06/2019 and 08/2019 had 2 left leg and 1 on right leg   Patient Active Problem List   Diagnosis Date Noted   Mood disorder ([place]) 06/22/2022   Elevated CK 06/18/2022   Emphysema lung ([place]) 03/17/2022   Lung nodules 03/17/2022   Rheumatoid arthritis ([place]) 10/13/2021   Bilateral hearing loss 07/23/2021   Hypertension associated with diabetes ([place]) 05/25/2021   Glaucoma of both eyes 05/25/2021   OSA on CPAP 05/17/2021   Aortic atherosclerosis ([place]) 05/17/2021   Vitamin B12 deficiency 06/01/2017   Arthritis 01/24/2017   Carpal tunnel syndrome 01/24/2017   Hyperlipidemia 01/24/2017   Lumbar radiculopathy 01/24/2017   Peripheral vascular disease ([place]) 01/24/2017   Vitamin D deficiency 01/24/2017   GERD (gastroesophageal reflux disease) 11/30/2014   CKD (chronic kidney disease) stage 3, GFR 30-59 ml/min (HCC) 11/07/2014   Cervical spondylosis with radiculopathy 06/05/2014   DM (diabetes  mellitus), type 2 with peripheral vascular complications ([place]) 14/48/1856     THERAPY DIAG:  Other low back pain  Radiculopathy, lumbar region  Chronic pain of left knee  Chronic pain of right knee  Muscle weakness (generalized)  Abnormal posture   PCP: Carollee Leitz, MD  REFERRING PROVIDER: Carollee Leitz, MD  REFERRING DIAG: M54.16 (ICD-10-CM) - Lumbar radiculopathy  Rationale for Evaluation and Treatment: Rehabilitation  EVAL THERAPY DIAG:  Other low back pain  Radiculopathy, lumbar region  Chronic pain of left knee  Chronic pain of right knee  Muscle weakness (generalized)  Abnormal posture  ONSET DATE: 06/2021  SUBJECTIVE:                                                                                                                                                                                           SUBJECTIVE STATEMENT: Pt indicated having some pain with walking but nothing major.     PERTINENT HISTORY:  Arthritis multiple joints, CAD, cervical surgery 2016, CKD, DDD, DM, history of DVT, GERD, Gout, HTN, Hyperlipidemia, PAD, PVD. History of hip replacement  PAIN:  NPRS scale: mild in knees, no pain in back (no numbers reported) Pain location:  back, bilateral knees Pain description: ache, "hurts" Aggravating factors: standing, walking movements, transfers, picking up/lifting Relieving factors: rest, tylenol  PRECAUTIONS: None  WEIGHT BEARING RESTRICTIONS: No  FALLS:  Has patient fallen in last 6 months? 2 Some fear of falling reported  LIVING ENVIRONMENT: Lives in: House/apartment Stairs: Flight of stairs to bedroom with rail on Lt side, porch has several steps (no handrail) Has following equipment at home: FWW, SPC  OCCUPATION: Retired  PLOF: Independent  PATIENT GOALS: Help pain, move better.    OBJECTIVE:   PATIENT SURVEYS:  06/29/2022 FOTO eval: 16   predicted:  33  SCREENING FOR RED FLAGS: 06/29/2022 Bowel or bladder  incontinence: No Cauda equina syndrome: No  COGNITION: 06/29/2022 Overall cognitive status: WFL normal      SENSATION: 06/29/2022 No specific testing today  MUSCLE LENGTH: 06/29/2022 No specific testing today but visual check in standing posture showed possibility for bilateral hip flexor tightness  POSTURE:  06/29/2022  FHP, rounded shoulders, forward trunk lean, reduced lumbar lordosis, wider base of support.  Difficulty to perform independently without balance support.   LUMBAR ROM:   AROM 06/29/2022  Flexion   Extension Unable to obtain neutral   Right lateral flexion   Left lateral flexion   Right rotation   Left rotation    (Blank rows = not tested)   LOWER EXTREMITY MMT:    MMT Right 06/29/2022 Left 06/29/2022  Hip flexion 4/5 4/5  Hip extension    Hip abduction Seated 2+/5 Seated 2+/5  Hip adduction    Hip internal rotation    Hip external rotation    Knee flexion 4/5 4/5  Knee extension 4/5 4/5  Ankle dorsiflexion 2/5 5/5  Ankle plantarflexion    Ankle inversion    Ankle eversion     (Blank rows = not tested)  LUMBAR SPECIAL TESTS:  06/29/2022 No specific testing today due to time.  FUNCTIONAL TESTS:  06/29/2022 Unable to perform sit to stand from 18 inch chair s UE assist and multiple tries/additional time.  Pivot transfer from chair to chair required supervision and additional time.   07/04/22 Timed up and go (TUG): 28.97 sec with RW   GAIT: 06/29/2022 Distance walked: 80 ft Assistive device utilized: FWW Level of assistance: SBA Comments: Posture from standing (listed above) similar in ambulation  Used wheelchair to enter and move within clinic prior to visit and after visit.   TODAY'S TREATMENT:                                                                                                    DATE: 07/06/2022  TherEx: NuStep L4 x 8 min; UE/LE Seated marching without back support but UE on armrests 2 x 10  Seated hip abduction isometric hold  green x 20 bilateral Seated yellow ball hip adduction squeeze 5 sec hold x 10 Seated green band rows without back support 2 x 10   Additional time spent in cues for intervention, rest breaks due to fatigue.  Neuro Re-ed Feet together stance eyes closed 30 sec x 2 c CGA to min A at times Modified tandem (wider base of support, small step forward) 1 min x 2 bilateral with CGA to min A    TODAY'S TREATMENT:                                                                                                    DATE: 07/04/2022  TherEx: NuStep L4 x 8 min; UE/LE Seated scapular retraction 10 x 5 sec hold Hip adduction isometric (seated ball squeeze) 10 x 5 sec hol Hip abduction seated with L3 band x10 reps Sit to/from stand x 5 reps with UE support and RW in front of pt  Physical Performance Test TUG performed - see above for results; reviewed with pt   TODAY'S TREATMENT:                                                                                                    DATE: 06/29/2022  Therex:    HEP instruction/performance c cues for techniques, handout provided.  Trial set performed of each for comprehension and symptom assessment.  See below for exercise list  PATIENT EDUCATION:  06/29/2022 Education details: HEP, POC Person educated: Patient Education method: Explanation, Demonstration, Verbal cues, and Handouts Education comprehension: verbalized understanding, returned demonstration, and verbal cues required  HOME EXERCISE PROGRAM: Access Code: [email or web address] URL: [email or web address] Date: 06/29/2022 Prepared by: Scot Jun  Exercises - Seated Scapular Retraction  - 1-2 x daily - 7 x weekly - 1 sets - 10 reps - 3-5 hold - Seated Hip Adduction Squeeze with Ball  - 1-2 x daily - 7 x weekly - 1 sets - 10 reps - 5 hold - Seated Hip Abduction  - 1-2 x daily - 7 x weekly - 1-2 sets - 10 reps - Sit to Stand with Counter Support  - 3 x daily - 7 x weekly - 1 sets - 5-10  reps  ASSESSMENT:  CLINICAL IMPRESSION: Difficulty in unsupported standing movement without bilateral HHA on walker.  Continued strengthening, progressive mobility and balance improvements to help improve functional movement pattern.  OBJECTIVE IMPAIRMENTS: Abnormal gait, decreased activity tolerance, decreased balance, decreased coordination, decreased endurance, decreased mobility, difficulty walking, decreased ROM, decreased strength, hypomobility, increased fascial restrictions, impaired perceived functional ability, impaired flexibility, improper body mechanics, postural dysfunction, and pain.   ACTIVITY LIMITATIONS: carrying, lifting, bending, standing, squatting, stairs, transfers, toileting, dressing, reach over head, hygiene/grooming, and locomotion level  PARTICIPATION LIMITATIONS: meal prep, cleaning, laundry, driving, community activity, and occupation  PERSONAL FACTORS: Arthritis multiple joints, CAD, cervical surgery 2016, CKD, DDD, DM, history of DVT, GERD, Gout, HTN, Hyperlipidemia, PAD, PVD,  length of time since onset are also affecting patient's functional outcome.   REHAB POTENTIAL: Good  CLINICAL DECISION MAKING: Evolving/moderate complexity  EVALUATION COMPLEXITY: Moderate   GOALS: Goals reviewed with patient? Yes  SHORT TERM GOALS: (target date for Short term goals are 3 weeks 07/20/2022)  1. Patient will demonstrate independent use of home exercise program to maintain progress from in clinic treatments.  Goal status: on going 07/06/2022  LONG TERM GOALS: (target dates for all long term goals are 12 weeks  09/21/2022 )   1. Patient will demonstrate/report pain at worst less than or equal to 2/10 to facilitate minimal limitation in daily activity secondary to pain symptoms.  Goal status: New   2. Patient will demonstrate independent use of home exercise program to facilitate ability to maintain/progress functional gains from skilled physical therapy  services.  Goal status: New   3. Patient will demonstrate FOTO outcome > or = 33 % to indicate reduced disability due to condition.  Goal status: New   4. Patient will demonstrate lumbar extension to neutral s symptoms to facilitate upright standing, walking posture at PLOF s limitation.  Goal status: New   5.  Patient will demonstrate/report ability to walk community distances 300 ft or greater with LRAD independent for community integration.   Goal status: New   6.  Patient will demonstrate TUG < 13 seconds to reduce fall risk.  Goal status: New   7.  Patient will demonstrate bilateral knee MMT 5/5, hip flexion 5/5, hip abduction > or = 4/5 to facilitate ability to move within house and community.  Goal Status: New  PLAN:  PT FREQUENCY: 1-2x/week  PT DURATION: 10 weeks  PLANNED INTERVENTIONS: Therapeutic exercises, Therapeutic activity, Neuro Muscular re-education, Balance training, Gait training, Patient/Family education, Joint mobilization, Stair training, DME instructions, Dry Needling, Electrical stimulation, Cryotherapy, vasopneumatic device, Moist heat, Taping, Traction Ultrasound, Ionotophoresis '4mg'$ /ml Dexamethasone, and Manual therapy.  All included unless contraindicated  PLAN FOR NEXT SESSION: Static balance, BIG inspired stepping as able for mobility improvements.   Scot Jun, PT, DPT, OCS, ATC 07/06/22  2:18 PM

## 2022-07-11 ENCOUNTER — Encounter: Payer: Medicare PPO | Admitting: Rehabilitative and Restorative Service Providers"

## 2022-07-13 ENCOUNTER — Encounter: Payer: Self-pay | Admitting: Rehabilitative and Restorative Service Providers"

## 2022-07-13 ENCOUNTER — Ambulatory Visit: Payer: Medicare PPO | Admitting: Rehabilitative and Restorative Service Providers"

## 2022-07-13 DIAGNOSIS — M5459 Other low back pain: Secondary | ICD-10-CM

## 2022-07-13 DIAGNOSIS — M25561 Pain in right knee: Secondary | ICD-10-CM

## 2022-07-13 DIAGNOSIS — M6281 Muscle weakness (generalized): Secondary | ICD-10-CM

## 2022-07-13 DIAGNOSIS — M25562 Pain in left knee: Secondary | ICD-10-CM

## 2022-07-13 DIAGNOSIS — R293 Abnormal posture: Secondary | ICD-10-CM

## 2022-07-13 DIAGNOSIS — M5416 Radiculopathy, lumbar region: Secondary | ICD-10-CM | POA: Diagnosis not present

## 2022-07-13 DIAGNOSIS — G8929 Other chronic pain: Secondary | ICD-10-CM

## 2022-07-13 NOTE — Therapy (Signed)
OUTPATIENT PHYSICAL THERAPY TREATMENT NOTE   Patient Name: Natalie Watts MRN: [id number] DOB:03/26/1946, 77 y.o., female Today's Date: 07/13/2022  END OF SESSION:   PT End of Session - 07/13/22 1257     Visit Number 4    Number of Visits 24    Date for PT Re-Evaluation 09/21/22    Authorization Type [employer] $20 copay    Authorization - Visit Number 4    Authorization - Number of Visits 12    PT Start Time 1300    PT Stop Time 1340    PT Time Calculation (min) 40 min    Equipment Utilized During Treatment Gait belt    Activity Tolerance Patient tolerated treatment well;Patient limited by fatigue    Behavior During Therapy WFL for tasks assessed/performed               Past Medical History:  Diagnosis Date   Abnormal CT of the chest 03/20/2022   Abnormal gait 10/13/2021   Abnormal MRI, lumbar spine 10/22/2021   Adjustment disorder with depressed mood 07/23/2021   AKI (acute kidney injury) ([place]) 05/25/2021   Allergic conjunctivitis 10/09/2017   ANA positive 05/24/2021   Arthritis    lumbar, cervical, cervical/lumbar spondylosis   Arthritis of left hip 02/22/2022   Arthritis of left knee 12/22/2021   Bilateral hip joint arthritis 02/22/2022   Bilateral hip pain 12/22/2021   Blood clotting disorder ([place])    Bronchiectasis ([place]) 03/17/2022   CAD (coronary artery disease)    CAD (coronary artery disease) 05/17/2021   Candidiasis of skin 01/24/2017   Cardiac murmur    Carotid bruit    US carotid had 09/03/14 [facility]    Carotid bruit 06/01/2017   Carpal tunnel syndrome    Cataracts, bilateral    Cervical radiculopathy    improved since surgery 2016    Cervical radiculopathy 01/24/2017   Cervical spondylosis without myelopathy 06/05/2014   Cervicalgia 04/05/2018   Chicken pox    Chronic bursitis of left shoulder 05/27/2021   Chronic left shoulder pain 05/25/2021   Chronic pain of left knee 07/23/2021   CKD (chronic kidney disease) stage 3, GFR  30-59 ml/min ([place])    per Dr Vassie Moselle Su notes [place] noted 12/14/20   Colon polyp    12/18/14 tubulovillous adenoma high grade dysplasia Dr. Loann Quill [facility]    COVID 19+    mid 11/2020   COVID-19    01/17/22   Degenerative disk disease    Diabetes ([place])    Diabetes ([place]) 04/16/2013   Overview:   Last A1c in Oct. 6.2.  Recheck at next visit.  No medications right now. Working on increasing activity.  Next step: add back metformin at a low dose if necessary.       Overview:   Last A1c in Oct. 6.2.  Recheck at next visit.  No medications right now. Working on increasing activity.  Next step: add back metformin at a low dose if necessary.       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      Overview:   Controlled on amlodipine.      Overview:   Overview:   Overview:   Controlled on amlodipine.    Hyperlipidemia    Hypertension    Impaired instrumental activities of daily living (IADL) 12/22/2021   Impaired mobility and ADLs 12/22/2021   Impairment of balance 01/24/2017   Incontinence of feces 12/08/2021   Insomnia    Insomnia 01/24/2017   Intertrigo    Left lumbar radiculitis 10/19/2012   Low back pain  04/05/2018   Lumbar radiculopathy    improved after steroid inj and PT 10/21/20 had right L4/5 L5-S1 transforaminal epidural steroid injections   LVH (left ventricular hypertrophy) 05/27/2021   Malignant tumor of colon ([place]) 12/26/2014   Nontraumatic complete tear of right rotator cuff 04/16/2018   Obesity 01/24/2017   Onychomycosis    Onychomycosis 05/17/2021   OSA on CPAP    dx in 2020 after hip surgery CPAP 9-13 cm H20 supplies [employer] PSG 03/18/2019 severe OSA AHI 42.1/hr titration 03/27/2019 rec APAP 9-13 cm H20   Osteoarthritis of knee 01/16/2017   PAD (peripheral artery disease) ([place])    1 x right, 3 x left in 2020-2022 needs bypass on leg had CTA with run off 11/2020 [place]   PAD (peripheral artery disease) (HCC)    with stents   PAD (peripheral artery disease) ([place]) 03/10/2016   B/l stents placed while living in [place] then moved to [place] and vascular MD Dr. Delbert Phenix in [place]  Note 07/27/20 ABI right 0.82 and left 0.35      CTA with runoff severe calcified plaque prox SFA multifocal moderate to severe areas of calcified plaque right popliteal artery with associated moderate to severe areas of stenosis moderate to severe focal areas of stenosis in the tibioperoneal t   Peripheral vascular disease ([place])    Primary osteoarthritis of first carpometacarpal joint of left hand 06/05/2014   Right foot drop    since 2020   Right foot drop 05/17/2021   Rotator cuff tendinitis, right 04/16/2018   Screening for colorectal cancer 11/30/2014   Shoulder pain, right 05/08/2018   Spinal stenosis of lumbar region 10/22/2021   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/2012   Spinal stenosis, lumbar 10/14/2021   Subdeltoid bursitis    Synovitis of ankle 12/15/2016   Thoracic arthritis 03/17/2022   Thoracic spinal stenosis 12/08/2021   Tinea pedis    Tubular adenoma of colon 05/25/2021   Vitamin D deficiency    Past Surgical History:  Procedure Laterality Date    ABDOMINAL AORTOGRAM W/LOWER EXTREMITY N/A 06/01/2021   Procedure: ABDOMINAL AORTOGRAM W/LOWER EXTREMITY;  Surgeon: Serafina Mitchell, MD;  Location: [facility];  Service: Cardiovascular;  Laterality: N/A;   BACK SURGERY     cervical laminoplasty C3-C7 Rex Dr. 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 Edwena Bunde [place] 12 mm d  colon polyp, grade 1 IH, moderate diverticulosis tubular   ESOPHAGOGASTRODUODENOSCOPY  05/01/2020   erosive gastritis Dr. Tilford Pillar [place]   PERIPHERAL VASCULAR INTERVENTION  06/01/2021   Procedure: PERIPHERAL VASCULAR INTERVENTION;  Surgeon: Serafina Mitchell, MD;  Location: [facility];  Service: Cardiovascular;;  RT. SFA   right total hip Right    03/05/2019 or 02/26/2020   TUBAL LIGATION     UPPER GI ENDOSCOPY     05/01/20   VASCULAR SURGERY     08/19/19   VASCULAR SURGERY     07/05/19   VASCULAR SURGERY     05/21/2019 angioplasty since 05/2019, 06/2019 and 08/2019 had 2 left leg and 1 on right leg   Patient Active Problem List   Diagnosis Date Noted   Mood disorder ([place]) 06/22/2022   Elevated CK 06/18/2022   Emphysema lung ([place]) 03/17/2022   Lung nodules 03/17/2022   Rheumatoid arthritis ([place]) 10/13/2021   Bilateral hearing loss 07/23/2021   Hypertension associated with diabetes ([place]) 05/25/2021   Glaucoma of both eyes 05/25/2021   OSA on CPAP 05/17/2021   Aortic atherosclerosis ([place]) 05/17/2021   Vitamin B12 deficiency 06/01/2017   Arthritis 01/24/2017   Carpal tunnel syndrome 01/24/2017   Hyperlipidemia 01/24/2017   Lumbar radiculopathy 01/24/2017   Peripheral vascular disease ([place]) 01/24/2017   Vitamin D deficiency 01/24/2017   GERD (gastroesophageal reflux disease) 11/30/2014   CKD (chronic kidney disease) stage 3, GFR 30-59 ml/min (HCC) 11/07/2014   Cervical spondylosis with radiculopathy 06/05/2014   DM  (diabetes mellitus), type 2 with peripheral vascular complications ([place]) 39/08/90     THERAPY DIAG:  Other low back pain  Radiculopathy, lumbar region  Chronic pain of left knee  Chronic pain of right knee  Muscle weakness (generalized)  Abnormal posture   PCP: Carollee Leitz, MD  REFERRING PROVIDER: Carollee Leitz, MD  REFERRING DIAG: M54.16 (ICD-10-CM) - Lumbar radiculopathy  Rationale for Evaluation and Treatment: Rehabilitation  EVAL THERAPY DIAG:  Other low back pain  Radiculopathy, lumbar region  Chronic pain of left knee  Chronic pain of right knee  Muscle weakness (generalized)  Abnormal posture  ONSET DATE: 06/2021  SUBJECTIVE:                                                                                                                                                                                           SUBJECTIVE STATEMENT: She indicated feeling stiff today in knees.  She stated she feels better when the sun is shining.  She reported off and on feelings over the weekend related to knees.    PERTINENT HISTORY:  Arthritis multiple  joints, CAD, cervical surgery 2016, CKD, DDD, DM, history of DVT, GERD, Gout, HTN, Hyperlipidemia, PAD, PVD. History of hip replacement  PAIN:  NPRS scale: no specific pain in knees, "stiffness" Pain location:  back, bilateral knees Pain description: stiff Aggravating factors: standing, walking movements, transfers, picking up/lifting Relieving factors: rest, tylenol  PRECAUTIONS: None  WEIGHT BEARING RESTRICTIONS: No  FALLS:  Has patient fallen in last 6 months? 2 Some fear of falling reported  LIVING ENVIRONMENT: Lives in: House/apartment Stairs: Flight of stairs to bedroom with rail on Lt side, porch has several steps (no handrail) Has following equipment at home: FWW, SPC  OCCUPATION: Retired  PLOF: Independent  PATIENT GOALS: Help pain, move better.    OBJECTIVE:   PATIENT SURVEYS:   06/29/2022 FOTO eval: 16   predicted:  33  SCREENING FOR RED FLAGS: 06/29/2022 Bowel or bladder incontinence: No Cauda equina syndrome: No  COGNITION: 06/29/2022 Overall cognitive status: WFL normal      SENSATION: 06/29/2022 No specific testing today  MUSCLE LENGTH: 06/29/2022 No specific testing today but visual check in standing posture showed possibility for bilateral hip flexor tightness  POSTURE:  06/29/2022  FHP, rounded shoulders, forward trunk lean, reduced lumbar lordosis, wider base of support.  Difficulty to perform independently without balance support.   LUMBAR ROM:   AROM 06/29/2022  Flexion   Extension Unable to obtain neutral   Right lateral flexion   Left lateral flexion   Right rotation   Left rotation    (Blank rows = not tested)   LOWER EXTREMITY MMT:    MMT Right 06/29/2022 Left 06/29/2022  Hip flexion 4/5 4/5  Hip extension    Hip abduction Seated 2+/5 Seated 2+/5  Hip adduction    Hip internal rotation    Hip external rotation    Knee flexion 4/5 4/5  Knee extension 4/5 4/5  Ankle dorsiflexion 2/5 5/5  Ankle plantarflexion    Ankle inversion    Ankle eversion     (Blank rows = not tested)  LUMBAR SPECIAL TESTS:  06/29/2022 No specific testing today due to time.  FUNCTIONAL TESTS:  07/04/22 Timed up and go (TUG): 28.97 sec with RW   06/29/2022 Unable to perform sit to stand from 18 inch chair s UE assist and multiple tries/additional time.  Pivot transfer from chair to chair required supervision and additional time.     GAIT: 07/13/2022:  FWW c supervision in clinic 260 ft prior to fatigue requiring rest break.   06/29/2022 Distance walked: 80 ft Assistive device utilized: FWW Level of assistance: SBA Comments: Posture from standing (listed above) similar in ambulation  Used wheelchair to enter and move within clinic prior to visit and after visit.   TODAY'S TREATMENT:                                                                                                     DATE: 07/13/2022  TherEx: NuStep L5 x 8 min; UE/LE Seated hip abduction isometric hold blue band 2 x 10 bilateral   TherActivity FWW ambulation for endurance/distance improvements c supervision,  performed 260 ft  Neuro Re-ed BIG inspired forward stepping x 10 bilateral c CGA and occasional hand assist Lateral stepping in // bars with bilateral hand assist x 3 each way 10 ft each Modified tandem (wider base of support, small step forward) 1 min x 1 bilateral with CGA to min A  TODAY'S TREATMENT:                                                                                                    DATE: 07/06/2022  TherEx: NuStep L4 x 8 min; UE/LE Seated marching without back support but UE on armrests 2 x 10  Seated hip abduction isometric hold green x 20 bilateral Seated yellow ball hip adduction squeeze 5 sec hold x 10 Seated green band rows without back support 2 x 10   Additional time spent in cues for intervention, rest breaks due to fatigue.    Neuro Re-ed Feet together stance eyes closed 30 sec x 2 c CGA to min A at times Modified tandem (wider base of support, small step forward) 1 min x 2 bilateral with CGA to min A    TODAY'S TREATMENT:                                                                                                    DATE: 07/04/2022  TherEx: NuStep L4 x 8 min; UE/LE Seated scapular retraction 10 x 5 sec hold Hip adduction isometric (seated ball squeeze) 10 x 5 sec hol Hip abduction seated with L3 band x10 reps Sit to/from stand x 5 reps with UE support and RW in front of pt  Physical Performance Test TUG performed - see above for results; reviewed with pt    PATIENT EDUCATION:  06/29/2022 Education details: HEP, POC Person educated: Patient Education method: Consulting civil engineer, Media planner, Verbal cues, and Handouts Education comprehension: verbalized understanding, returned demonstration, and verbal cues  required  HOME EXERCISE PROGRAM: Access Code: [email or web address] URL: [email or web address] Date: 06/29/2022 Prepared by: Scot Jun  Exercises - Seated Scapular Retraction  - 1-2 x daily - 7 x weekly - 1 sets - 10 reps - 3-5 hold - Seated Hip Adduction Squeeze with Ball  - 1-2 x daily - 7 x weekly - 1 sets - 10 reps - 5 hold - Seated Hip Abduction  - 1-2 x daily - 7 x weekly - 1-2 sets - 10 reps - Sit to Stand with Counter Support  - 3 x daily - 7 x weekly - 1 sets - 5-10 reps  ASSESSMENT:  CLINICAL IMPRESSION: Pt to continue to benefit from progressive general strengthening and endurance improvements to improve progressive daily mobility independence and tolerance.  Knee complaints continue  to be chief reported symptom when discussed in clinic.   Walking distances improved notably since last assessment.   OBJECTIVE IMPAIRMENTS: Abnormal gait, decreased activity tolerance, decreased balance, decreased coordination, decreased endurance, decreased mobility, difficulty walking, decreased ROM, decreased strength, hypomobility, increased fascial restrictions, impaired perceived functional ability, impaired flexibility, improper body mechanics, postural dysfunction, and pain.   ACTIVITY LIMITATIONS: carrying, lifting, bending, standing, squatting, stairs, transfers, toileting, dressing, reach over head, hygiene/grooming, and locomotion level  PARTICIPATION LIMITATIONS: meal prep, cleaning, laundry, driving, community activity, and occupation  PERSONAL FACTORS: Arthritis multiple joints, CAD, cervical surgery 2016, CKD, DDD, DM, history of DVT, GERD, Gout, HTN, Hyperlipidemia, PAD, PVD, length of time since onset are also affecting patient's functional outcome.   REHAB POTENTIAL: Good  CLINICAL DECISION MAKING: Evolving/moderate complexity  EVALUATION COMPLEXITY: Moderate   GOALS: Goals reviewed with patient? Yes  SHORT TERM GOALS: (target date for Short term goals are 3 weeks  07/20/2022)  1. Patient will demonstrate independent use of home exercise program to maintain progress from in clinic treatments.  Goal status: on going 07/06/2022  LONG TERM GOALS: (target dates for all long term goals are 12 weeks  09/21/2022 )   1. Patient will demonstrate/report pain at worst less than or equal to 2/10 to facilitate minimal limitation in daily activity secondary to pain symptoms.  Goal status: New   2. Patient will demonstrate independent use of home exercise program to facilitate ability to maintain/progress functional gains from skilled physical therapy services.  Goal status: New   3. Patient will demonstrate FOTO outcome > or = 33 % to indicate reduced disability due to condition.  Goal status: New   4. Patient will demonstrate lumbar extension to neutral s symptoms to facilitate upright standing, walking posture at PLOF s limitation.  Goal status: New   5.  Patient will demonstrate/report ability to walk community distances 300 ft or greater with LRAD independent for community integration.   Goal status: New   6.  Patient will demonstrate TUG < 13 seconds to reduce fall risk.  Goal status: New   7.  Patient will demonstrate bilateral knee MMT 5/5, hip flexion 5/5, hip abduction > or = 4/5 to facilitate ability to move within house and community.  Goal Status: New  PLAN:  PT FREQUENCY: 1-2x/week  PT DURATION: 10 weeks  PLANNED INTERVENTIONS: Therapeutic exercises, Therapeutic activity, Neuro Muscular re-education, Balance training, Gait training, Patient/Family education, Joint mobilization, Stair training, DME instructions, Dry Needling, Electrical stimulation, Cryotherapy, vasopneumatic device, Moist heat, Taping, Traction Ultrasound, Ionotophoresis '4mg'$ /ml Dexamethasone, and Manual therapy.  All included unless contraindicated  PLAN FOR NEXT SESSION:  Continued general strengthening, progressive balance improvements.   Scot Jun, PT, DPT, OCS,  ATC 07/13/22  1:53 PM

## 2022-07-18 ENCOUNTER — Ambulatory Visit: Payer: Medicare PPO | Admitting: Podiatry

## 2022-08-02 NOTE — Therapy (Unsigned)
OUTPATIENT PHYSICAL THERAPY TREATMENT NOTE   Patient Name: Natalie Watts MRN: [id number] DOB:[birth year], 77 y.o., female Today's Date: 08/03/2022  END OF SESSION:   PT End of Session - 08/03/22 1303     Visit Number 5    Number of Visits 24    Date for PT Re-Evaluation 09/21/22    Authorization Type [employer] $20 copay    Authorization - Visit Number 5    Authorization - Number of Visits 12    PT Start Time [id number]    PT Stop Time 1338    PT Time Calculation (min) 40 min    Equipment Utilized During Treatment Gait belt    Activity Tolerance Patient tolerated treatment well;Patient limited by fatigue    Behavior During Therapy WFL for tasks assessed/performed              Past Medical History:  Diagnosis Date   Abnormal CT of the chest 03/20/2022   Abnormal gait 10/13/2021   Abnormal MRI, lumbar spine 10/22/2021   Adjustment disorder with depressed mood 07/23/2021   AKI (acute kidney injury) ([place]) 05/25/2021   Allergic conjunctivitis 10/09/2017   ANA positive 05/24/2021   Arthritis    lumbar, cervical, cervical/lumbar spondylosis   Arthritis of left hip 02/22/2022   Arthritis of left knee 12/22/2021   Bilateral hip joint arthritis 02/22/2022   Bilateral hip pain 12/22/2021   Blood clotting disorder ([place])    Bronchiectasis ([place]) 03/17/2022   CAD (coronary artery disease)    CAD (coronary artery disease) 05/17/2021   Candidiasis of skin 01/24/2017   Cardiac murmur    Carotid bruit    US carotid had 09/03/14 [facility]    Carotid bruit 06/01/2017   Carpal tunnel syndrome    Cataracts, bilateral    Cervical radiculopathy    improved since surgery 2016    Cervical radiculopathy 01/24/2017   Cervical spondylosis without myelopathy 06/05/2014   Cervicalgia 04/05/2018   Chicken pox    Chronic bursitis of left shoulder 05/27/2021   Chronic left shoulder pain 05/25/2021   Chronic pain of left knee 07/23/2021   CKD (chronic kidney disease) stage 3, GFR  30-59 ml/min ([place])    per Dr Vassie Moselle Su notes [place] noted 12/14/20   Colon polyp    12/18/14 tubulovillous adenoma high grade dysplasia Dr. Loann Quill [facility]    COVID 19+    mid 11/2020   COVID-19    01/17/22   Degenerative disk disease    Diabetes ([place])    Diabetes ([place]) 04/16/2013   Overview:   Last A1c in Oct. 6.2.  Recheck at next visit.  No medications right now. Working on increasing activity.  Next step: add back metformin at a low dose if necessary.       Overview:   Last A1c in Oct. 6.2.  Recheck at next visit.  No medications right now. Working on increasing activity.  Next step: add back metformin at a low dose if necessary.       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      Overview:   Controlled on amlodipine.      Overview:   Overview:   Overview:   Controlled on amlodipine.    Hyperlipidemia    Hypertension    Impaired instrumental activities of daily living (IADL) 12/22/2021   Impaired mobility and ADLs 12/22/2021   Impairment of balance 01/24/2017   Incontinence of feces 12/08/2021   Insomnia    Insomnia 01/24/2017   Intertrigo    Left lumbar radiculitis 10/19/2012   Low back pain  04/05/2018   Lumbar radiculopathy    improved after steroid inj and PT 10/21/20 had right L4/5 L5-S1 transforaminal epidural steroid injections   LVH (left ventricular hypertrophy) 05/27/2021   Malignant tumor of colon ([place]) 12/26/2014   Nontraumatic complete tear of right rotator cuff 04/16/2018   Obesity 01/24/2017   Onychomycosis    Onychomycosis 05/17/2021   OSA on CPAP    dx in 2020 after hip surgery CPAP 9-13 cm H20 supplies [employer] PSG 03/18/2019 severe OSA AHI 42.1/hr titration 03/27/2019 rec APAP 9-13 cm H20   Osteoarthritis of knee 01/16/2017   PAD (peripheral artery disease) ([place])    1 x right, 3 x left in 2020-2022 needs bypass on leg had CTA with run off 11/2020 [place]   PAD (peripheral artery disease) (HCC)    with stents   PAD (peripheral artery disease) ([place]) 03/10/2016   B/l stents placed while living in [place] then moved to [place] and vascular MD Dr. Delbert Phenix in [place]  Note 07/27/20 ABI right 0.82 and left 0.35      CTA with runoff severe calcified plaque prox SFA multifocal moderate to severe areas of calcified plaque right popliteal artery with associated moderate to severe areas of stenosis moderate to severe focal areas of stenosis in the tibioperoneal t   Peripheral vascular disease ([place])    Primary osteoarthritis of first carpometacarpal joint of left hand 06/05/2014   Right foot drop    since 2020   Right foot drop 05/17/2021   Rotator cuff tendinitis, right 04/16/2018   Screening for colorectal cancer 11/30/2014   Shoulder pain, right 05/08/2018   Spinal stenosis of lumbar region 10/22/2021   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/2012   Spinal stenosis, lumbar 10/14/2021   Subdeltoid bursitis    Synovitis of ankle 12/15/2016   Thoracic arthritis 03/17/2022   Thoracic spinal stenosis 12/08/2021   Tinea pedis    Tubular adenoma of colon 05/25/2021   Vitamin D deficiency    Past Surgical History:  Procedure Laterality Date    ABDOMINAL AORTOGRAM W/LOWER EXTREMITY N/A 06/01/2021   Procedure: ABDOMINAL AORTOGRAM W/LOWER EXTREMITY;  Surgeon: Serafina Mitchell, MD;  Location: [facility];  Service: Cardiovascular;  Laterality: N/A;   BACK SURGERY     cervical laminoplasty C3-C7 Rex Dr. 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 Edwena Bunde [place] 12 mm d  colon polyp, grade 1 IH, moderate diverticulosis tubular   ESOPHAGOGASTRODUODENOSCOPY  05/01/2020   erosive gastritis Dr. Tilford Pillar [place]   PERIPHERAL VASCULAR INTERVENTION  06/01/2021   Procedure: PERIPHERAL VASCULAR INTERVENTION;  Surgeon: Serafina Mitchell, MD;  Location: [facility];  Service: Cardiovascular;;  RT. SFA   right total hip Right    03/05/2019 or 02/26/2020   TUBAL LIGATION     UPPER GI ENDOSCOPY     05/01/20   VASCULAR SURGERY     08/19/19   VASCULAR SURGERY     07/05/19   VASCULAR SURGERY     05/21/2019 angioplasty since 05/2019, 06/2019 and 08/2019 had 2 left leg and 1 on right leg   Patient Active Problem List   Diagnosis Date Noted   Mood disorder ([place]) 06/22/2022   Elevated CK 06/18/2022   Emphysema lung ([place]) 03/17/2022   Lung nodules 03/17/2022   Rheumatoid arthritis ([place]) 10/13/2021   Bilateral hearing loss 07/23/2021   Hypertension associated with diabetes ([place]) 05/25/2021   Glaucoma of both eyes 05/25/2021   OSA on CPAP 05/17/2021   Aortic atherosclerosis ([place]) 05/17/2021   Vitamin B12 deficiency 06/01/2017   Arthritis 01/24/2017   Carpal tunnel syndrome 01/24/2017   Hyperlipidemia 01/24/2017   Lumbar radiculopathy 01/24/2017   Peripheral vascular disease ([place]) 01/24/2017   Vitamin D deficiency 01/24/2017   GERD (gastroesophageal reflux disease) 11/30/2014   CKD (chronic kidney disease) stage 3, GFR 30-59 ml/min (HCC) 11/07/2014   Cervical spondylosis with radiculopathy 06/05/2014   DM  (diabetes mellitus), type 2 with peripheral vascular complications ([place]) [id number]     THERAPY DIAG:  Other low back pain  Chronic pain of left knee  Abnormal posture  Radiculopathy, lumbar region  Muscle weakness (generalized)  Chronic pain of right knee   PCP: Carollee Leitz, MD  REFERRING PROVIDER: Carollee Leitz, MD  REFERRING DIAG: M54.16 (ICD-10-CM) - Lumbar radiculopathy  Rationale for Evaluation and Treatment: Rehabilitation  EVAL THERAPY DIAG:  Other low back pain  Radiculopathy, lumbar region  Chronic pain of left knee  Chronic pain of right knee  Muscle weakness (generalized)  Abnormal posture  ONSET DATE: 06/2021  SUBJECTIVE:                                                                                                                                                                                           SUBJECTIVE STATEMENT: "I am doing pretty good today, I have not yet taken a tylenol".   PERTINENT HISTORY:  Arthritis multiple joints, CAD, cervical surgery 2016, CKD, DDD, DM, history of DVT, GERD, Gout, HTN, Hyperlipidemia, PAD, PVD. History of  hip replacement  PAIN:  NPRS scale: no specific pain in knees, "stiffness" Pain location:  back, bilateral knees Pain description: stiff Aggravating factors: standing, walking movements, transfers, picking up/lifting Relieving factors: rest, tylenol  PRECAUTIONS: None  WEIGHT BEARING RESTRICTIONS: No  FALLS:  Has patient fallen in last 6 months? 2 Some fear of falling reported  LIVING ENVIRONMENT: Lives in: House/apartment Stairs: Flight of stairs to bedroom with rail on Lt side, porch has several steps (no handrail) Has following equipment at home: FWW, SPC  OCCUPATION: Retired  PLOF: Independent  PATIENT GOALS: Help pain, move better.    OBJECTIVE:   PATIENT SURVEYS:  06/29/2022 FOTO eval: 16   predicted:  33  SCREENING FOR RED FLAGS: 06/29/2022 Bowel or bladder incontinence:  No Cauda equina syndrome: No  COGNITION: 06/29/2022 Overall cognitive status: WFL normal      SENSATION: 06/29/2022 No specific testing today  MUSCLE LENGTH: 06/29/2022 No specific testing today but visual check in standing posture showed possibility for bilateral hip flexor tightness  POSTURE:  06/29/2022  FHP, rounded shoulders, forward trunk lean, reduced lumbar lordosis, wider base of support.  Difficulty to perform independently without balance support.   LUMBAR ROM:   AROM 06/29/2022  Flexion   Extension Unable to obtain neutral   Right lateral flexion   Left lateral flexion   Right rotation   Left rotation    (Blank rows = not tested)   LOWER EXTREMITY MMT:    MMT Right 06/29/2022 Left 06/29/2022  Hip flexion 4/5 4/5  Hip extension    Hip abduction Seated 2+/5 Seated 2+/5  Hip adduction    Hip internal rotation    Hip external rotation    Knee flexion 4/5 4/5  Knee extension 4/5 4/5  Ankle dorsiflexion 2/5 5/5  Ankle plantarflexion    Ankle inversion    Ankle eversion     (Blank rows = not tested)  LUMBAR SPECIAL TESTS:  06/29/2022 No specific testing today due to time.  FUNCTIONAL TESTS:  07/04/22 Timed up and go (TUG): 28.97 sec with RW   06/29/2022 Unable to perform sit to stand from 18 inch chair s UE assist and multiple tries/additional time.  Pivot transfer from chair to chair required supervision and additional time.   GAIT: 07/13/2022:  FWW c supervision in clinic 260 ft prior to fatigue requiring rest break.   06/29/2022 Distance walked: 80 ft Assistive device utilized: FWW Level of assistance: SBA Comments: Posture from standing (listed above) similar in ambulation  Used wheelchair to enter and move within clinic prior to visit and after visit.   TODAY'S TREATMENT:                                                                                                    DATE: 08/03/2022  TherEx: NuStep L5 x 8 min; UE/LE Seated hip abduction  isometric hold blue band 2 x 10 bilateral   TherActivity FWW ambulation for endurance/distance improvements c supervision, performed 260 ft  Neuro Re-ed BIG inspired forward stepping x 10 bilateral c CGA and occasional hand assist Lateral stepping  in // bars with bilateral hand assist x 3 each way 10 ft each Modified tandem (wider base of support, small step forward) 1 min x 1 bilateral with CGA to min A  TODAY'S TREATMENT:                                                                                                    DATE: 07/13/2022  TherEx: NuStep L5 x 8 min; UE/LE Seated hip abduction Green band 2 x 10 bilateral Step up's 4" step lateral x10 each leg, UE assist  Step up's 4" forward x15 each leg, UE assist   TherActivity Sit to stand from chair with nose over toes momentum strategy x10 with UE assist as needed.   Neuro Re-ed Marching on airex pad x30  Balance on airex pad 2x30 sec hold.  Lateral stepping in // bars with bilateral hand assist x 3 each way 10 ft each Modified tandem (wider base of support, small step forward) 1 min x 1 bilateral with CGA to min A  TODAY'S TREATMENT:                                                                                                    DATE: 07/06/2022  TherEx: NuStep L4 x 8 min; UE/LE Seated marching without back support but UE on armrests 2 x 10  Seated hip abduction isometric hold green x 20 bilateral Seated yellow ball hip adduction squeeze 5 sec hold x 10 Seated green band rows without back support 2 x 10   Additional time spent in cues for intervention, rest breaks due to fatigue.    Neuro Re-ed Feet together stance eyes closed 30 sec x 2 c CGA to min A at times Modified tandem (wider base of support, small step forward) 1 min x 2 bilateral with CGA to min A    TODAY'S TREATMENT:                                                                                                    DATE: 07/04/2022  TherEx: NuStep L4 x 8  min; UE/LE Seated scapular retraction 10 x 5 sec hold Hip adduction isometric (seated ball squeeze) 10 x 5 sec hol Hip abduction seated with L3 band x10 reps Sit to/from  stand x 5 reps with UE support and RW in front of pt  Physical Performance Test TUG performed - see above for results; reviewed with pt    PATIENT EDUCATION:  06/29/2022 Education details: HEP, POC Person educated: Patient Education method: Consulting civil engineer, Demonstration, Verbal cues, and Handouts Education comprehension: verbalized understanding, returned demonstration, and verbal cues required  HOME EXERCISE PROGRAM: Access Code: [email or web address] URL: [email or web address] Date: 06/29/2022 Prepared by: Scot Jun  Exercises - Seated Scapular Retraction  - 1-2 x daily - 7 x weekly - 1 sets - 10 reps - 3-5 hold - Seated Hip Adduction Squeeze with Ball  - 1-2 x daily - 7 x weekly - 1 sets - 10 reps - 5 hold - Seated Hip Abduction  - 1-2 x daily - 7 x weekly - 1-2 sets - 10 reps - Sit to Stand with Counter Support  - 3 x daily - 7 x weekly - 1 sets - 5-10 reps  ASSESSMENT:  CLINICAL IMPRESSION: Pt to continue to benefit from progressive general strengthening and endurance improvements to improve progressive daily mobility independence and tolerance. She demonstrates the most difficulty with sit to stands and marching today.   OBJECTIVE IMPAIRMENTS: Abnormal gait, decreased activity tolerance, decreased balance, decreased coordination, decreased endurance, decreased mobility, difficulty walking, decreased ROM, decreased strength, hypomobility, increased fascial restrictions, impaired perceived functional ability, impaired flexibility, improper body mechanics, postural dysfunction, and pain.   ACTIVITY LIMITATIONS: carrying, lifting, bending, standing, squatting, stairs, transfers, toileting, dressing, reach over head, hygiene/grooming, and locomotion level  PARTICIPATION LIMITATIONS: meal prep, cleaning,  laundry, driving, community activity, and occupation  PERSONAL FACTORS: Arthritis multiple joints, CAD, cervical surgery 2016, CKD, DDD, DM, history of DVT, GERD, Gout, HTN, Hyperlipidemia, PAD, PVD, length of time since onset are also affecting patient's functional outcome.   REHAB POTENTIAL: Good  CLINICAL DECISION MAKING: Evolving/moderate complexity  EVALUATION COMPLEXITY: Moderate   GOALS: Goals reviewed with patient? Yes  SHORT TERM GOALS: (target date for Short term goals are 3 weeks 07/20/2022)  1. Patient will demonstrate independent use of home exercise program to maintain progress from in clinic treatments.  Goal status: on going 07/06/2022  LONG TERM GOALS: (target dates for all long term goals are 12 weeks  09/21/2022 )   1. Patient will demonstrate/report pain at worst less than or equal to 2/10 to facilitate minimal limitation in daily activity secondary to pain symptoms.  Goal status: New   2. Patient will demonstrate independent use of home exercise program to facilitate ability to maintain/progress functional gains from skilled physical therapy services.  Goal status: New   3. Patient will demonstrate FOTO outcome > or = 33 % to indicate reduced disability due to condition.  Goal status: New   4. Patient will demonstrate lumbar extension to neutral s symptoms to facilitate upright standing, walking posture at PLOF s limitation.  Goal status: New   5.  Patient will demonstrate/report ability to walk community distances 300 ft or greater with LRAD independent for community integration.   Goal status: New   6.  Patient will demonstrate TUG < 13 seconds to reduce fall risk.  Goal status: New   7.  Patient will demonstrate bilateral knee MMT 5/5, hip flexion 5/5, hip abduction > or = 4/5 to facilitate ability to move within house and community.  Goal Status: New  PLAN:  PT FREQUENCY: 1-2x/week  PT DURATION: 10 weeks  PLANNED INTERVENTIONS: Therapeutic  exercises, Therapeutic activity, Neuro Muscular re-education, Balance training, Gait training, Patient/Family education,  Joint mobilization, Stair training, DME instructions, Dry Needling, Electrical stimulation, Cryotherapy, vasopneumatic device, Moist heat, Taping, Traction Ultrasound, Ionotophoresis 70m/ml Dexamethasone, and Manual therapy.  All included unless contraindicated  PLAN FOR NEXT SESSION:  Continued general strengthening, progressive balance improvements.   SRudi HeapPT, DPT 08/03/22  1:40 PM

## 2022-08-03 ENCOUNTER — Ambulatory Visit: Payer: Medicare PPO | Admitting: Physical Therapy

## 2022-08-03 ENCOUNTER — Encounter: Payer: Self-pay | Admitting: Physical Therapy

## 2022-08-03 DIAGNOSIS — M25561 Pain in right knee: Secondary | ICD-10-CM

## 2022-08-03 DIAGNOSIS — G8929 Other chronic pain: Secondary | ICD-10-CM

## 2022-08-03 DIAGNOSIS — M5459 Other low back pain: Secondary | ICD-10-CM

## 2022-08-03 DIAGNOSIS — M25562 Pain in left knee: Secondary | ICD-10-CM

## 2022-08-03 DIAGNOSIS — R293 Abnormal posture: Secondary | ICD-10-CM

## 2022-08-03 DIAGNOSIS — M5416 Radiculopathy, lumbar region: Secondary | ICD-10-CM

## 2022-08-03 DIAGNOSIS — M6281 Muscle weakness (generalized): Secondary | ICD-10-CM

## 2022-08-10 ENCOUNTER — Encounter: Payer: Self-pay | Admitting: Physical Therapy

## 2022-08-10 ENCOUNTER — Ambulatory Visit: Payer: Medicare PPO | Admitting: Physical Therapy

## 2022-08-10 DIAGNOSIS — M5416 Radiculopathy, lumbar region: Secondary | ICD-10-CM

## 2022-08-10 DIAGNOSIS — M5459 Other low back pain: Secondary | ICD-10-CM | POA: Diagnosis not present

## 2022-08-10 DIAGNOSIS — G8929 Other chronic pain: Secondary | ICD-10-CM

## 2022-08-10 DIAGNOSIS — M6281 Muscle weakness (generalized): Secondary | ICD-10-CM

## 2022-08-10 DIAGNOSIS — M25561 Pain in right knee: Secondary | ICD-10-CM

## 2022-08-10 DIAGNOSIS — R293 Abnormal posture: Secondary | ICD-10-CM

## 2022-08-10 DIAGNOSIS — M25562 Pain in left knee: Secondary | ICD-10-CM | POA: Diagnosis not present

## 2022-08-10 NOTE — Therapy (Signed)
OUTPATIENT PHYSICAL THERAPY TREATMENT NOTE   Patient Name: Natalie Watts MRN: [id number] DOB:[birth year], 77 y.o., female Today's Date: 08/10/2022  END OF SESSION:   PT End of Session - 08/10/22 1315     Visit Number 6    Number of Visits 24    Date for PT Re-Evaluation 09/21/22    Authorization Type [employer] $20 copay    PT Start Time 1302    PT Stop Time 1340    PT Time Calculation (min) 38 min    Activity Tolerance Patient tolerated treatment well;Patient limited by fatigue    Behavior During Therapy [facility] for tasks assessed/performed               Past Medical History:  Diagnosis Date   Abnormal CT of the chest 03/20/2022   Abnormal gait 10/13/2021   Abnormal MRI, lumbar spine 10/22/2021   Adjustment disorder with depressed mood 07/23/2021   AKI (acute kidney injury) ([place]) 05/25/2021   Allergic conjunctivitis 10/09/2017   ANA positive 05/24/2021   Arthritis    lumbar, cervical, cervical/lumbar spondylosis   Arthritis of left hip 02/22/2022   Arthritis of left knee 12/22/2021   Bilateral hip joint arthritis 02/22/2022   Bilateral hip pain 12/22/2021   Blood clotting disorder ([place])    Bronchiectasis ([place]) 03/17/2022   CAD (coronary artery disease)    CAD (coronary artery disease) 05/17/2021   Candidiasis of skin 01/24/2017   Cardiac murmur    Carotid bruit    US carotid had 09/03/14 [facility]    Carotid bruit 06/01/2017   Carpal tunnel syndrome    Cataracts, bilateral    Cervical radiculopathy    improved since surgery 2016    Cervical radiculopathy 01/24/2017   Cervical spondylosis without myelopathy 06/05/2014   Cervicalgia 04/05/2018   Chicken pox    Chronic bursitis of left shoulder 05/27/2021   Chronic left shoulder pain 05/25/2021   Chronic pain of left knee 07/23/2021   CKD (chronic kidney disease) stage 3, GFR 30-59 ml/min ([place])    per Dr Vassie Moselle Su notes [place] noted 12/14/20   Colon polyp    12/18/14 tubulovillous adenoma  high grade dysplasia Dr. Loann Quill [facility]    COVID 19+    mid 11/2020   COVID-19    01/17/22   Degenerative disk disease    Diabetes ([place])    Diabetes ([place]) 04/16/2013   Overview:   Last A1c in Oct. 6.2.  Recheck at next visit.  No medications right now. Working on increasing activity.  Next step: add back metformin at a low dose if necessary.       Overview:   Last A1c in Oct. 6.2.  Recheck at next visit.  No medications right now. Working on increasing activity.  Next step: add back metformin at a low dose if necessary.       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      Overview:   Controlled on amlodipine.      Overview:   Overview:   Overview:   Controlled on amlodipine.    Hyperlipidemia    Hypertension    Impaired instrumental activities of daily living (IADL) 12/22/2021   Impaired mobility and ADLs 12/22/2021   Impairment of balance 01/24/2017   Incontinence of feces 12/08/2021   Insomnia    Insomnia 01/24/2017   Intertrigo    Left lumbar radiculitis 10/19/2012   Low back pain 04/05/2018   Lumbar radiculopathy    improved after steroid inj and PT 10/21/20 had right L4/5 L5-S1 transforaminal epidural  steroid injections   LVH (left ventricular hypertrophy) 05/27/2021   Malignant tumor of colon ([place]) 12/26/2014   Nontraumatic complete tear of right rotator cuff 04/16/2018   Obesity 01/24/2017   Onychomycosis    Onychomycosis 05/17/2021   OSA on CPAP    dx in 2020 after hip surgery CPAP 9-13 cm H20 supplies [employer] PSG 03/18/2019 severe OSA AHI 42.1/hr titration 03/27/2019 rec APAP 9-13 cm H20   Osteoarthritis of knee 01/16/2017   PAD (peripheral artery disease) ([place])    1 x right, 3 x left in 2020-2022 needs bypass on leg had CTA with run off 11/2020 [place]   PAD (peripheral artery disease) (HCC)    with stents   PAD (peripheral artery disease) ([place]) 03/10/2016   B/l stents placed while living in [place] then moved to [place] and vascular [place] Dr. Delbert Phenix in [place]  Note 07/27/20 ABI right 0.82 and left 0.35      CTA with runoff severe calcified plaque prox SFA multifocal moderate to severe areas of calcified plaque right popliteal artery with associated moderate to severe areas of stenosis moderate to severe focal areas of stenosis in the tibioperoneal t   Peripheral vascular disease ([place])    Primary osteoarthritis of first carpometacarpal joint of left hand 06/05/2014   Right foot drop    since 2020   Right foot drop 05/17/2021   Rotator cuff tendinitis, right 04/16/2018   Screening for colorectal cancer 11/30/2014   Shoulder pain, right 05/08/2018   Spinal stenosis of lumbar region 10/22/2021   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/2012   Spinal stenosis, lumbar 10/14/2021   Subdeltoid bursitis    Synovitis of ankle 12/15/2016   Thoracic arthritis 03/17/2022   Thoracic spinal stenosis 12/08/2021   Tinea pedis    Tubular adenoma of colon 05/25/2021   Vitamin D deficiency    Past Surgical History:  Procedure Laterality Date   ABDOMINAL AORTOGRAM W/LOWER EXTREMITY N/A 06/01/2021   Procedure: ABDOMINAL AORTOGRAM W/LOWER EXTREMITY;  Surgeon: Serafina Mitchell, MD;  Location: [facility];  Service: Cardiovascular;  Laterality: N/A;   BACK SURGERY     cervical laminoplasty C3-C7 Rex Dr. 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 Edwena Bunde [place] 12 mm d colon polyp, grade 1 IH, moderate diverticulosis tubular   ESOPHAGOGASTRODUODENOSCOPY  05/01/2020   erosive gastritis Dr. Edwena Bunde [place]en[place]   PERIPHERAL VASCULAR  INTERVENTION  06/01/2021   Procedure: PERIPHERAL VASCULAR INTERVENTION;  Surgeon: Serafina Mitchell, MD;  Location: [facility];  Service: Cardiovascular;;  RT. SFA   right total hip Right    03/05/2019 or 02/26/2020   TUBAL LIGATION     UPPER GI ENDOSCOPY     05/01/20   VASCULAR SURGERY     08/19/19   VASCULAR SURGERY     07/05/19   VASCULAR SURGERY     05/21/2019 angioplasty since 05/2019, 06/2019 and 08/2019 had 2 left leg and 1 on right leg   Patient Active Problem List   Diagnosis Date Noted   Mood disorder ([place]) 06/22/2022   Elevated CK 06/18/2022   Emphysema lung ([place]) 03/17/2022   Lung nodules 03/17/2022   Rheumatoid arthritis ([place]) 10/13/2021   Bilateral hearing loss 07/23/2021   Hypertension associated with diabetes ([place]) 05/25/2021   Glaucoma of both eyes 05/25/2021   OSA on CPAP 05/17/2021   Aortic atherosclerosis ([place]) 05/17/2021   Vitamin B12 deficiency 06/01/2017   Arthritis 01/24/2017   Carpal tunnel syndrome 01/24/2017   Hyperlipidemia 01/24/2017   Lumbar radiculopathy 01/24/2017   Peripheral vascular disease ([place]) 01/24/2017   Vitamin D deficiency 01/24/2017   GERD (gastroesophageal reflux disease) 11/30/2014   CKD (chronic kidney disease) stage 3, GFR 30-59 ml/min (HCC) 11/07/2014   Cervical spondylosis with radiculopathy 06/05/2014   DM (diabetes mellitus), type 2 with peripheral vascular complications ([place]) [id number]     THERAPY DIAG:  Other low back  pain  Chronic pain of left knee  Abnormal posture  Radiculopathy, lumbar region  Muscle weakness (generalized)  Chronic pain of right knee   PCP: Carollee Leitz, MD  REFERRING PROVIDER: Carollee Leitz, MD  REFERRING DIAG: M54.16 (ICD-10-CM) - Lumbar radiculopathy  Rationale for Evaluation and Treatment: Rehabilitation  EVAL THERAPY DIAG:  Other low back pain  Radiculopathy, lumbar region  Chronic pain of left knee  Chronic pain of right knee  Muscle weakness (generalized)  Abnormal posture  ONSET DATE: 06/2021  SUBJECTIVE:                                                                                                                                                                                           SUBJECTIVE STATEMENT:  Feeling kind of OK, haven't taken quite as many tylenols recently. No falls or close calls with falling recently, its been awhile. I've been doing some more standing on my own, its getting better like you saw I was able to take my coat off today, have had longer periods of time where I can cautiously stand without wobbling too much. Just started going  to a senior place where they do different things during the week, on Tuesday and Thursday I do about an hour of chair exercises its been helping too, after that we end up with another hour of chair volleyball that incorporates arms and shoulders, I was scared the first time I tried it but now I've been able to stretch out to keep the ball moving, yesterday was the second time I tried it.   PERTINENT HISTORY:  Arthritis multiple joints, CAD, cervical surgery 2016, CKD, DDD, DM, history of DVT, GERD, Gout, HTN, Hyperlipidemia, PAD, PVD. History of hip replacement  PAIN:  NPRS scale: 4/10 Pain location:  anterior thighs above knee joints  Pain description: "it changes, sometimes its like a constant pain, sometimes it feels like a throbbing"  Aggravating factors: getting up after I've been sitting  still Relieving factors: moving around unless I've been sitting down for a long time   PRECAUTIONS: None  WEIGHT BEARING RESTRICTIONS: No  FALLS:  Has patient fallen in last 6 months? 2 Some fear of falling reported  LIVING ENVIRONMENT: Lives in: House/apartment Stairs: Flight of stairs to bedroom with rail on Lt side, porch has several steps (no handrail) Has following equipment at home: FWW, SPC  OCCUPATION: Retired  PLOF: Independent  PATIENT GOALS: Help pain, move better.    OBJECTIVE:   PATIENT SURVEYS:  06/29/2022 FOTO eval: 16   predicted:  33  SCREENING FOR RED FLAGS: 06/29/2022 Bowel or bladder incontinence: No Cauda equina syndrome: No  COGNITION: 06/29/2022 Overall cognitive status: WFL normal      SENSATION: 06/29/2022 No specific testing today  MUSCLE LENGTH: 06/29/2022 No specific testing today but visual check in standing posture showed possibility for bilateral hip flexor tightness  POSTURE:  06/29/2022  FHP, rounded shoulders, forward trunk lean, reduced lumbar lordosis, wider base of support.  Difficulty to perform independently without balance support.   LUMBAR ROM:   AROM 06/29/2022  Flexion   Extension Unable to obtain neutral   Right lateral flexion   Left lateral flexion   Right rotation   Left rotation    (Blank rows = not tested)   LOWER EXTREMITY MMT:    MMT Right 06/29/2022 Left 06/29/2022  Hip flexion 4/5 4/5  Hip extension    Hip abduction Seated 2+/5 Seated 2+/5  Hip adduction    Hip internal rotation    Hip external rotation    Knee flexion 4/5 4/5  Knee extension 4/5 4/5  Ankle dorsiflexion 2/5 5/5  Ankle plantarflexion    Ankle inversion    Ankle eversion     (Blank rows = not tested)  LUMBAR SPECIAL TESTS:  06/29/2022 No specific testing today due to time.  FUNCTIONAL TESTS:  07/04/22 Timed up and go (TUG): 28.97 sec with RW   06/29/2022 Unable to perform sit to stand from 18 inch chair s UE assist and  multiple tries/additional time.  Pivot transfer from chair to chair required supervision and additional time.   GAIT: 07/13/2022:  FWW c supervision in clinic 260 ft prior to fatigue requiring rest break.   06/29/2022 Distance walked: 80 ft Assistive device utilized: FWW Level of assistance: SBA Comments: Posture from standing (listed above) similar in ambulation  Used wheelchair to enter and move within clinic prior to visit and after visit.   TODAY'S TREATMENT:  DATE:  08/10/22  TherEx  Seated LAQs green TB x10B Seated clams green TB x15 cues for adequate ROM and not letting band snap her around Seated marches green TB x10 B cues for amplitude of movement  Bridges 2x5  STS no UEs + rocking, cues for head forward x10 from low mat table  Min guarding for balance with no UE support while patient donned/doffied jacked, MinA for sleeve management     08/03/2022  TherEx: NuStep L5 x 8 min; UE/LE Seated hip abduction isometric hold blue band 2 x 10 bilateral   TherActivity FWW ambulation for endurance/distance improvements c supervision, performed 260 ft  Neuro Re-ed BIG inspired forward stepping x 10 bilateral c CGA and occasional hand assist Lateral stepping in // bars with bilateral hand assist x 3 each way 10 ft each Modified tandem (wider base of support, small step forward) 1 min x 1 bilateral with CGA to min A  TODAY'S TREATMENT:                                                                                                    DATE: 07/13/2022  TherEx: NuStep L5 x 8 min; UE/LE Seated hip abduction Green band 2 x 10 bilateral Step up's 4" step lateral x10 each leg, UE assist  Step up's 4" forward x15 each leg, UE assist   TherActivity Sit to stand from chair with nose over toes momentum strategy x10 with UE assist as needed.   Neuro Re-ed Marching on airex pad x30   Balance on airex pad 2x30 sec hold.  Lateral stepping in // bars with bilateral hand assist x 3 each way 10 ft each Modified tandem (wider base of support, small step forward) 1 min x 1 bilateral with CGA to min A  TODAY'S TREATMENT:                                                                                                    DATE: 07/06/2022  TherEx: NuStep L4 x 8 min; UE/LE Seated marching without back support but UE on armrests 2 x 10  Seated hip abduction isometric hold green x 20 bilateral Seated yellow ball hip adduction squeeze 5 sec hold x 10 Seated green band rows without back support 2 x 10   Additional time spent in cues for intervention, rest breaks due to fatigue.    Neuro Re-ed Feet together stance eyes closed 30 sec x 2 c CGA to min A at times Modified tandem (wider base of support, small step forward) 1 min x 2 bilateral with CGA to min A    TODAY'S TREATMENT:  DATE: 07/04/2022  TherEx: NuStep L4 x 8 min; UE/LE Seated scapular retraction 10 x 5 sec hold Hip adduction isometric (seated ball squeeze) 10 x 5 sec hol Hip abduction seated with L3 band x10 reps Sit to/from stand x 5 reps with UE support and RW in front of pt  Physical Performance Test TUG performed - see above for results; reviewed with pt    PATIENT EDUCATION:  06/29/2022 Education details: HEP, POC Person educated: Patient Education method: Consulting civil engineer, Media planner, Verbal cues, and Handouts Education comprehension: verbalized understanding, returned demonstration, and verbal cues required  HOME EXERCISE PROGRAM: Access Code: [email or web address] URL: [email or web address] Date: 06/29/2022 Prepared by: Scot Jun  Exercises - Seated Scapular Retraction  - 1-2 x daily - 7 x weekly - 1 sets - 10 reps - 3-5 hold - Seated Hip Adduction Squeeze with Ball  - 1-2 x daily - 7 x weekly - 1  sets - 10 reps - 5 hold - Seated Hip Abduction  - 1-2 x daily - 7 x weekly - 1-2 sets - 10 reps - Sit to Stand with Counter Support  - 3 x daily - 7 x weekly - 1 sets - 5-10 reps  ASSESSMENT:  CLINICAL IMPRESSION:  Ms. Dunford arrives today doing well, sounds like she is feeling better and starting to see some results from PT. Very pleasant and very cooperative this afternoon, but also very talkative which limited session quite a bit this afternoon. Worked on strengthening as time allowed today. Mod(I) when ambulating with RW but did need close min guard for safety when attempting to stand without BUE support. Will continue efforts.    OBJECTIVE IMPAIRMENTS: Abnormal gait, decreased activity tolerance, decreased balance, decreased coordination, decreased endurance, decreased mobility, difficulty walking, decreased ROM, decreased strength, hypomobility, increased fascial restrictions, impaired perceived functional ability, impaired flexibility, improper body mechanics, postural dysfunction, and pain.   ACTIVITY LIMITATIONS: carrying, lifting, bending, standing, squatting, stairs, transfers, toileting, dressing, reach over head, hygiene/grooming, and locomotion level  PARTICIPATION LIMITATIONS: meal prep, cleaning, laundry, driving, community activity, and occupation  PERSONAL FACTORS: Arthritis multiple joints, CAD, cervical surgery 2016, CKD, DDD, DM, history of DVT, GERD, Gout, HTN, Hyperlipidemia, PAD, PVD, length of time since onset are also affecting patient's functional outcome.   REHAB POTENTIAL: Good  CLINICAL DECISION MAKING: Evolving/moderate complexity  EVALUATION COMPLEXITY: Moderate   GOALS: Goals reviewed with patient? Yes  SHORT TERM GOALS: (target date for Short term goals are 3 weeks 07/20/2022)  1. Patient will demonstrate independent use of home exercise program to maintain progress from in clinic treatments.  Goal status: on going 07/06/2022  LONG TERM GOALS: (target  dates for all long term goals are 12 weeks  09/21/2022 )   1. Patient will demonstrate/report pain at worst less than or equal to 2/10 to facilitate minimal limitation in daily activity secondary to pain symptoms.  Goal status: New   2. Patient will demonstrate independent use of home exercise program to facilitate ability to maintain/progress functional gains from skilled physical therapy services.  Goal status: New   3. Patient will demonstrate FOTO outcome > or = 33 % to indicate reduced disability due to condition.  Goal status: New   4. Patient will demonstrate lumbar extension to neutral s symptoms to facilitate upright standing, walking posture at PLOF s limitation.  Goal status: New   5.  Patient will demonstrate/report ability to walk community distances 300 ft or greater with LRAD independent for community integration.   Goal status: New  6.  Patient will demonstrate TUG < 13 seconds to reduce fall risk.  Goal status: New   7.  Patient will demonstrate bilateral knee MMT 5/5, hip flexion 5/5, hip abduction > or = 4/5 to facilitate ability to move within house and community.  Goal Status: New  PLAN:  PT FREQUENCY: 1-2x/week  PT DURATION: 10 weeks  PLANNED INTERVENTIONS: Therapeutic exercises, Therapeutic activity, Neuro Muscular re-education, Balance training, Gait training, Patient/Family education, Joint mobilization, Stair training, DME instructions, Dry Needling, Electrical stimulation, Cryotherapy, vasopneumatic device, Moist heat, Taping, Traction Ultrasound, Ionotophoresis 73m/ml Dexamethasone, and Manual therapy.  All included unless contraindicated  PLAN FOR NEXT SESSION:  Continued general strengthening, progressive balance improvements.   KDeniece ReePT DPT PN2

## 2022-08-15 ENCOUNTER — Ambulatory Visit: Payer: Medicare PPO | Admitting: Rehabilitative and Restorative Service Providers"

## 2022-08-15 DIAGNOSIS — M25562 Pain in left knee: Secondary | ICD-10-CM | POA: Diagnosis not present

## 2022-08-15 DIAGNOSIS — G8929 Other chronic pain: Secondary | ICD-10-CM

## 2022-08-15 DIAGNOSIS — M5459 Other low back pain: Secondary | ICD-10-CM

## 2022-08-15 DIAGNOSIS — M6281 Muscle weakness (generalized): Secondary | ICD-10-CM

## 2022-08-15 DIAGNOSIS — M5416 Radiculopathy, lumbar region: Secondary | ICD-10-CM | POA: Diagnosis not present

## 2022-08-15 DIAGNOSIS — R293 Abnormal posture: Secondary | ICD-10-CM | POA: Diagnosis not present

## 2022-08-15 DIAGNOSIS — M25561 Pain in right knee: Secondary | ICD-10-CM

## 2022-08-15 NOTE — Therapy (Signed)
OUTPATIENT PHYSICAL THERAPY TREATMENT NOTE   Patient Name: Natalie Watts MRN: [id number] DOB:20-Aug-1945, 77 y.o., female Today's Date: 08/15/2022  END OF SESSION:   PT End of Session - 08/15/22 1220     Visit Number 7    Number of Visits 24    Date for PT Re-Evaluation 09/21/22    Authorization Type [employer] $20 copay    Authorization - Visit Number 7    Authorization - Number of Visits 12    PT Start Time [id number]    PT Stop Time 1300    PT Time Calculation (min) 40 min    Activity Tolerance Patient tolerated treatment well;Patient limited by fatigue    Behavior During Therapy [facility] for tasks assessed/performed                Past Medical History:  Diagnosis Date   Abnormal CT of the chest 03/20/2022   Abnormal gait 10/13/2021   Abnormal MRI, lumbar spine 10/22/2021   Adjustment disorder with depressed mood 07/23/2021   AKI (acute kidney injury) ([place]) 05/25/2021   Allergic conjunctivitis 10/09/2017   ANA positive 05/24/2021   Arthritis    lumbar, cervical, cervical/lumbar spondylosis   Arthritis of left hip 02/22/2022   Arthritis of left knee 12/22/2021   Bilateral hip joint arthritis 02/22/2022   Bilateral hip pain 12/22/2021   Blood clotting disorder ([place])    Bronchiectasis ([place]) 03/17/2022   CAD (coronary artery disease)    CAD (coronary artery disease) 05/17/2021   Candidiasis of skin 01/24/2017   Cardiac murmur    Carotid bruit    US carotid had 09/03/14 [facility]    Carotid bruit 06/01/2017   Carpal tunnel syndrome    Cataracts, bilateral    Cervical radiculopathy    improved since surgery 2016    Cervical radiculopathy 01/24/2017   Cervical spondylosis without myelopathy 06/05/2014   Cervicalgia 04/05/2018   Chicken pox    Chronic bursitis of left shoulder 05/27/2021   Chronic left shoulder pain 05/25/2021   Chronic pain of left knee 07/23/2021   CKD (chronic kidney disease) stage 3, GFR 30-59 ml/min (HCC)    per Dr Vassie Moselle Su  notes [place] noted 12/14/20   Colon polyp    12/18/14 tubulovillous adenoma high grade dysplasia Dr. Loann Quill [facility]    COVID 19+    mid 11/2020   COVID-19    01/17/22   Degenerative disk disease    Diabetes ([place])    Diabetes ([place]) 04/16/2013   Overview:   Last A1c in Oct. 6.2.  Recheck at next visit.  No medications right now. Working on increasing activity.  Next step: add back metformin at a low dose if necessary.       Overview:   Last A1c in Oct. 6.2.  Recheck at next visit.  No medications right now. Working on increasing activity.  Next step: add back metformin at a low dose if necessary.       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      Overview:   Controlled on amlodipine.      Overview:   Overview:   Overview:   Controlled on amlodipine.    Hyperlipidemia    Hypertension    Impaired instrumental activities of daily living (IADL) 12/22/2021   Impaired mobility and ADLs 12/22/2021   Impairment of balance 01/24/2017   Incontinence of feces 12/08/2021   Insomnia    Insomnia 01/24/2017   Intertrigo    Left lumbar radiculitis 10/19/2012   Low back pain 04/05/2018   Lumbar radiculopathy    improved  after steroid inj and PT 10/21/20 had right L4/5 L5-S1 transforaminal epidural steroid injections   LVH (left ventricular hypertrophy) 05/27/2021   Malignant tumor of colon ([place]) 12/26/2014   Nontraumatic complete tear of right rotator cuff 04/16/2018   Obesity 01/24/2017   Onychomycosis    Onychomycosis 05/17/2021   OSA on CPAP    dx in 2020 after hip surgery CPAP 9-13 cm H20 supplies [employer] PSG 03/18/2019 severe OSA AHI 42.1/hr titration 03/27/2019 rec APAP 9-13 cm H20   Osteoarthritis of knee 01/16/2017   PAD (peripheral artery disease) ([place])    1 x right, 3 x left in 2020-2022 needs bypass on leg had CTA with run off 11/2020 [place]   PAD (peripheral artery disease) (HCC)    with stents   PAD (peripheral artery disease) ([place]) 03/10/2016   B/l stents placed while living in [place] then moved to [place] and vascular MD Dr. Delbert Phenix in [place]  Note 07/27/20 ABI right 0.82 and left 0.35      CTA with runoff severe calcified plaque prox SFA multifocal moderate to severe areas of calcified plaque right popliteal artery with associated moderate to severe areas of stenosis moderate to severe focal areas of stenosis in the tibioperoneal t   Peripheral vascular disease ([place])    Primary osteoarthritis of first carpometacarpal joint of left hand 06/05/2014   Right foot drop    since 2020   Right foot drop 05/17/2021   Rotator cuff tendinitis, right 04/16/2018   Screening for colorectal cancer 11/30/2014   Shoulder pain, right 05/08/2018   Spinal stenosis of lumbar region 10/22/2021   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/2012   Spinal stenosis, lumbar 10/14/2021   Subdeltoid bursitis    Synovitis of ankle 12/15/2016   Thoracic arthritis 03/17/2022   Thoracic spinal stenosis 12/08/2021   Tinea pedis    Tubular adenoma of colon 05/25/2021   Vitamin D deficiency    Past Surgical History:  Procedure Laterality Date   ABDOMINAL AORTOGRAM W/LOWER EXTREMITY N/A  06/01/2021   Procedure: ABDOMINAL AORTOGRAM W/LOWER EXTREMITY;  Surgeon: Serafina Mitchell, MD;  Location: [facility];  Service: Cardiovascular;  Laterality: N/A;   BACK SURGERY     cervical laminoplasty C3-C7 Rex Dr. 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 Edwena Bunde [place] 12 mm d colon polyp, grade 1 IH,  moderate diverticulosis tubular   ESOPHAGOGASTRODUODENOSCOPY  05/01/2020   erosive gastritis Dr. Tilford Pillar [place]   PERIPHERAL VASCULAR INTERVENTION  06/01/2021   Procedure: PERIPHERAL VASCULAR INTERVENTION;  Surgeon: Serafina Mitchell, MD;  Location: [facility];  Service: Cardiovascular;;  RT. SFA   right total hip Right    03/05/2019 or 02/26/2020   TUBAL LIGATION     UPPER GI ENDOSCOPY     05/01/20   VASCULAR SURGERY     08/19/19   VASCULAR SURGERY     07/05/19   VASCULAR SURGERY     05/21/2019 angioplasty since 05/2019, 06/2019 and 08/2019 had 2 left leg and 1 on right leg   Patient Active Problem List   Diagnosis Date Noted   Mood disorder ([place]) 06/22/2022   Elevated CK 06/18/2022   Emphysema lung ([place]) 03/17/2022   Lung nodules 03/17/2022   Rheumatoid arthritis ([place]) 10/13/2021   Bilateral hearing loss 07/23/2021   Hypertension associated with diabetes ([place]) 05/25/2021   Glaucoma of both eyes 05/25/2021   OSA on CPAP 05/17/2021   Aortic atherosclerosis ([place]) 05/17/2021   Vitamin B12 deficiency 06/01/2017   Arthritis 01/24/2017   Carpal tunnel syndrome 01/24/2017   Hyperlipidemia 01/24/2017   Lumbar radiculopathy 01/24/2017   Peripheral vascular disease ([place]) 01/24/2017   Vitamin D deficiency 01/24/2017   GERD (gastroesophageal reflux disease) 11/30/2014   CKD (chronic kidney disease) stage 3, GFR 30-59 ml/min (HCC) 11/07/2014   Cervical spondylosis with radiculopathy 06/05/2014   DM (diabetes mellitus), type 2 with  peripheral vascular complications ([place]) [id number]     THERAPY DIAG:  Other low back pain  Chronic pain of left knee  Abnormal posture  Radiculopathy, lumbar region  Muscle weakness (generalized)  Chronic pain of right knee   PCP: Carollee Leitz, [place]  REFERRING PROVIDER: Carollee Leitz, MD  REFERRING DIAG: M54.16 (ICD-10-CM) - Lumbar radiculopathy  Rationale for Evaluation and Treatment: Rehabilitation  EVAL THERAPY DIAG:  Other low back pain  Radiculopathy, lumbar region  Chronic pain of left knee  Chronic pain of right knee  Muscle weakness (generalized)  Abnormal posture  ONSET DATE: 06/2021  SUBJECTIVE:                                                                                                                                                                                           SUBJECTIVE STATEMENT: She indicated having some days better and some days (weather, etc) that are troublesome.  Reported knees continued to be biggest pain complaint.   PERTINENT HISTORY:  Arthritis multiple joints, CAD, cervical surgery 2016, CKD, DDD, DM, history of DVT, GERD, Gout, HTN,  Hyperlipidemia, PAD, PVD. History of hip replacement  PAIN:  NPRS scale: 4/10 Pain location:  anterior thighs above knee joints  Pain description: "it changes, sometimes its like a constant pain, sometimes it feels like a throbbing"  Aggravating factors: getting up after I've been sitting still Relieving factors: moving around unless I've been sitting down for a long time   PRECAUTIONS: None  WEIGHT BEARING RESTRICTIONS: No  FALLS:  Has patient fallen in last 6 months? 2 Some fear of falling reported  LIVING ENVIRONMENT: Lives in: House/apartment Stairs: Flight of stairs to bedroom with rail on Lt side, porch has several steps (no handrail) Has following equipment at home: FWW, SPC  OCCUPATION: Retired  PLOF: Independent  PATIENT GOALS: Help pain, move better.    OBJECTIVE:    PATIENT SURVEYS:  06/29/2022 FOTO eval: 16   predicted:  33  SCREENING FOR RED FLAGS: 06/29/2022 Bowel or bladder incontinence: No Cauda equina syndrome: No  COGNITION: 06/29/2022 Overall cognitive status: WFL normal      SENSATION: 06/29/2022 No specific testing today  MUSCLE LENGTH: 06/29/2022 No specific testing today but visual check in standing posture showed possibility for bilateral hip flexor tightness  POSTURE:  06/29/2022  FHP, rounded shoulders, forward trunk lean, reduced lumbar lordosis, wider base of support.  Difficulty to perform independently without balance support.   LUMBAR ROM:   AROM 06/29/2022  Flexion   Extension Unable to obtain neutral   Right lateral flexion   Left lateral flexion   Right rotation   Left rotation    (Blank rows = not tested)   LOWER EXTREMITY MMT:    MMT Right 06/29/2022 Left 06/29/2022 Right 08/15/2022 Left 08/15/2022  Hip flexion 4/5 4/5 5/5 4/5 c pain  Hip extension      Hip abduction Seated 2+/5 Seated 2+/5    Hip adduction      Hip internal rotation      Hip external rotation      Knee flexion 4/5 4/5 5/5 4/5  Knee extension 4/5 4/5 5/5 4/5  Ankle dorsiflexion 2/5 5/5    Ankle plantarflexion      Ankle inversion      Ankle eversion       (Blank rows = not tested)  LUMBAR SPECIAL TESTS:  06/29/2022 No specific testing today due to time.  FUNCTIONAL TESTS:  08/15/2022:  TUG 27 seconds c FWW performed at end of session  07/04/22 Timed up and go (TUG): 28.97 sec with RW   06/29/2022 Unable to perform sit to stand from 18 inch chair s UE assist and multiple tries/additional time.  Pivot transfer from chair to chair required supervision and additional time.   GAIT: 07/13/2022:  FWW c supervision in clinic 260 ft prior to fatigue requiring rest break.   06/29/2022 Distance walked: 80 ft Assistive device utilized: FWW Level of assistance: SBA Comments: Posture from standing (listed above) similar in  ambulation  Used wheelchair to enter and move within clinic prior to visit and after visit.   TODAY'S TREATMENT:                                                      DATE:08/15/22 Therex Nustep UE/LE lvl 4 8 mins  TherActivity TUG x 1 c FWW Ambulation  150 ft x 2 bilateral c FWW for endurance  Neuro Re-ed BIG inspired Fwd stepping without UE x 12 bilateral LE c CGA to min A at times Feet together stance 90 seconds c eyes open CGA, with head turns Lt and Rt x 15 CGA to min A occasionally Modified tandem stance 1 min x 2 bilateral c CGA to min A at times   TODAY'S TREATMENT:                                                      DATE:08/10/22 TherEx  Seated LAQs green TB x10B Seated clams green TB x15 cues for adequate ROM and not letting band snap her around Seated marches green TB x10 B cues for amplitude of movement  Bridges 2x5  STS no UEs + rocking, cues for head forward x10 from low mat table  Min guarding for balance with no UE support while patient donned/doffied jacked, MinA for sleeve management   TODAY'S TREATMENT:                                                      DATE: 08/03/2022  TherEx: NuStep L5 x 8 min; UE/LE Seated hip abduction isometric hold blue band 2 x 10 bilateral   TherActivity FWW ambulation for endurance/distance improvements c supervision, performed 260 ft  Neuro Re-ed BIG inspired forward stepping x 10 bilateral c CGA and occasional hand assist Lateral stepping in // bars with bilateral hand assist x 3 each way 10 ft each Modified tandem (wider base of support, small step forward) 1 min x 1 bilateral with CGA to min A  TODAY'S TREATMENT:                                                       DATE: 07/13/2022  TherEx: NuStep L5 x 8 min; UE/LE Seated hip abduction Green band 2 x 10 bilateral Step up's 4" step lateral x10 each leg, UE assist  Step up's 4" forward x15 each leg, UE assist   TherActivity Sit to stand from chair with nose over toes  momentum strategy x10 with UE assist as needed.   Neuro Re-ed Marching on airex pad x30  Balance on airex pad 2x30 sec hold.  Lateral stepping in // bars with bilateral hand assist x 3 each way 10 ft each Modified tandem (wider base of support, small step forward) 1 min x 1 bilateral with CGA to min A   PATIENT EDUCATION:  06/29/2022 Education details: HEP, POC Person educated: Patient Education method: Consulting civil engineer, Demonstration, Verbal cues, and Handouts Education comprehension: verbalized understanding, returned demonstration, and verbal cues required  HOME EXERCISE PROGRAM: Access Code: [email or web address] URL: [email or web address] Date: 06/29/2022 Prepared by: Scot Jun  Exercises - Seated Scapular Retraction  - 1-2 x daily - 7 x weekly - 1 sets - 10 reps - 3-5 hold - Seated Hip Adduction Squeeze with Ball  - 1-2 x daily - 7 x weekly - 1 sets - 10 reps - 5 hold - Seated Hip Abduction  - 1-2 x daily -  7 x weekly - 1-2 sets - 10 reps - Sit to Stand with Counter Support  - 3 x daily - 7 x weekly - 1 sets - 5-10 reps  ASSESSMENT:  CLINICAL IMPRESSION: Ambulation quality c FWW seems to be improving but fatigue and knee symptoms can impact distance.  Poor to fair static balance control and continued improvements to help improve ambulation stability.    OBJECTIVE IMPAIRMENTS: Abnormal gait, decreased activity tolerance, decreased balance, decreased coordination, decreased endurance, decreased mobility, difficulty walking, decreased ROM, decreased strength, hypomobility, increased fascial restrictions, impaired perceived functional ability, impaired flexibility, improper body mechanics, postural dysfunction, and pain.   ACTIVITY LIMITATIONS: carrying, lifting, bending, standing, squatting, stairs, transfers, toileting, dressing, reach over head, hygiene/grooming, and locomotion level  PARTICIPATION LIMITATIONS: meal prep, cleaning, laundry, driving, community activity, and  occupation  PERSONAL FACTORS: Arthritis multiple joints, CAD, cervical surgery 2016, CKD, DDD, DM, history of DVT, GERD, Gout, HTN, Hyperlipidemia, PAD, PVD, length of time since onset are also affecting patient's functional outcome.   REHAB POTENTIAL: Good  CLINICAL DECISION MAKING: Evolving/moderate complexity  EVALUATION COMPLEXITY: Moderate   GOALS: Goals reviewed with patient? Yes  SHORT TERM GOALS: (target date for Short term goals are 3 weeks 07/20/2022)  1. Patient will demonstrate independent use of home exercise program to maintain progress from in clinic treatments.  Goal status: partially  met  LONG TERM GOALS: (target dates for all long term goals are 12 weeks  09/21/2022 )   1. Patient will demonstrate/report pain at worst less than or equal to 2/10 to facilitate minimal limitation in daily activity secondary to pain symptoms.  Goal status: on going 08/15/2022   2. Patient will demonstrate independent use of home exercise program to facilitate ability to maintain/progress functional gains from skilled physical therapy services.  Goal status: on going 08/15/2022   3. Patient will demonstrate FOTO outcome > or = 33 % to indicate reduced disability due to condition.  Goal status: on going 08/15/2022   4. Patient will demonstrate lumbar extension to neutral s symptoms to facilitate upright standing, walking posture at PLOF s limitation.  Goal status: on going 08/15/2022   5.  Patient will demonstrate/report ability to walk community distances 300 ft or greater with LRAD independent for community integration.   Goal status: on going 08/15/2022   6.  Patient will demonstrate TUG < 13 seconds to reduce fall risk.  Goal status: on going 08/15/2022   7.  Patient will demonstrate bilateral knee MMT 5/5, hip flexion 5/5, hip abduction > or = 4/5 to facilitate ability to move within house and community.  Goal Status: on going 08/15/2022  PLAN:  PT FREQUENCY: 1-2x/week  PT  DURATION: 10 weeks  PLANNED INTERVENTIONS: Therapeutic exercises, Therapeutic activity, Neuro Muscular re-education, Balance training, Gait training, Patient/Family education, Joint mobilization, Stair training, DME instructions, Dry Needling, Electrical stimulation, Cryotherapy, vasopneumatic device, Moist heat, Taping, Traction Ultrasound, Ionotophoresis '4mg'$ /ml Dexamethasone, and Manual therapy.  All included unless contraindicated  PLAN FOR NEXT SESSION:  Static balance improvements.  Stair training for bus on/off.  FOTO reassessment.    Scot Jun, PT, DPT, OCS, ATC 08/15/22  1:09 PM

## 2022-08-17 ENCOUNTER — Encounter: Payer: Self-pay | Admitting: Rehabilitative and Restorative Service Providers"

## 2022-08-17 ENCOUNTER — Ambulatory Visit (INDEPENDENT_AMBULATORY_CARE_PROVIDER_SITE_OTHER): Payer: Medicare PPO | Admitting: Rehabilitative and Restorative Service Providers"

## 2022-08-17 DIAGNOSIS — M5459 Other low back pain: Secondary | ICD-10-CM

## 2022-08-17 DIAGNOSIS — M6281 Muscle weakness (generalized): Secondary | ICD-10-CM

## 2022-08-17 DIAGNOSIS — R293 Abnormal posture: Secondary | ICD-10-CM

## 2022-08-17 DIAGNOSIS — M5416 Radiculopathy, lumbar region: Secondary | ICD-10-CM | POA: Diagnosis not present

## 2022-08-17 DIAGNOSIS — G8929 Other chronic pain: Secondary | ICD-10-CM

## 2022-08-17 DIAGNOSIS — M25562 Pain in left knee: Secondary | ICD-10-CM | POA: Diagnosis not present

## 2022-08-17 DIAGNOSIS — M25561 Pain in right knee: Secondary | ICD-10-CM

## 2022-08-17 NOTE — Therapy (Signed)
OUTPATIENT PHYSICAL THERAPY TREATMENT NOTE   Patient Name: Natalie Watts MRN: [id number] DOB:September 21, 1945, 77 y.o., female Today's Date: 08/17/2022  END OF SESSION:   PT End of Session - 08/17/22 1259     Visit Number 8    Number of Visits 24    Date for PT Re-Evaluation 09/21/22    Authorization Type [employer] $20 copay    Authorization - Visit Number 8    Authorization - Number of Visits 12    PT Start Time 1254    PT Stop Time 1334    PT Time Calculation (min) 40 min    Activity Tolerance Patient tolerated treatment well;Patient limited by fatigue    Behavior During Therapy [facility] for tasks assessed/performed                 Past Medical History:  Diagnosis Date   Abnormal CT of the chest 03/20/2022   Abnormal gait 10/13/2021   Abnormal MRI, lumbar spine 10/22/2021   Adjustment disorder with depressed mood 07/23/2021   AKI (acute kidney injury) ([place]) 05/25/2021   Allergic conjunctivitis 10/09/2017   ANA positive 05/24/2021   Arthritis    lumbar, cervical, cervical/lumbar spondylosis   Arthritis of left hip 02/22/2022   Arthritis of left knee 12/22/2021   Bilateral hip joint arthritis 02/22/2022   Bilateral hip pain 12/22/2021   Blood clotting disorder ([place])    Bronchiectasis ([place]) 03/17/2022   CAD (coronary artery disease)    CAD (coronary artery disease) 05/17/2021   Candidiasis of skin 01/24/2017   Cardiac murmur    Carotid bruit    US carotid had 09/03/14 [facility]    Carotid bruit 06/01/2017   Carpal tunnel syndrome    Cataracts, bilateral    Cervical radiculopathy    improved since surgery 2016    Cervical radiculopathy 01/24/2017   Cervical spondylosis without myelopathy 06/05/2014   Cervicalgia 04/05/2018   Chicken pox    Chronic bursitis of left shoulder 05/27/2021   Chronic left shoulder pain 05/25/2021   Chronic pain of left knee 07/23/2021   CKD (chronic kidney disease) stage 3, GFR 30-59 ml/min ([place])    per Dr Vassie Moselle Su  notes [place] noted 12/14/20   Colon polyp    12/18/14 tubulovillous adenoma high grade dysplasia Dr. Loann Quill [facility]    COVID 19+    mid 11/2020   COVID-19    01/17/22   Degenerative disk disease    Diabetes ([place])    Diabetes ([place]) 04/16/2013   Overview:   Last A1c in Oct. 6.2.  Recheck at next visit.  No medications right now. Working on increasing activity.  Next step: add back metformin at a low dose if necessary.       Overview:   Last A1c in Oct. 6.2.  Recheck at next visit.  No medications right now. Working on increasing activity.  Next step: add back metformin at a low dose if necessary.       Overview:   Overview:   Overview:      DVT (deep venous thrombosis) (HCC)    DVT, lower extremity ([place])    left, 05/2013 on Xarelto x 1 month etiology unkown    Dysplastic polyp of colon 12/25/2014   Gait abnormality 12/08/2021   Gallstones 05/17/2021   Gastroesophageal reflux disease without esophagitis 01/24/2017   Generalized osteoarthritis 01/24/2017   Genital herpes simplex type 2 01/24/2017   GERD (gastroesophageal reflux disease)    Gout  Gout 05/17/2021   H/O myocardial perfusion scan    06/08/20 low risk   Hammer toes, bilateral    Healthcare maintenance 04/16/2013   Overview:   Last flu shot :04/15/13   Heart murmur 06/16/2017   Hiatal hernia 05/17/2021   History of degenerative disc disease 06/01/2017   HSV-2 infection    HTN (hypertension) 04/16/2013   Overview:   Controlled on amlodipine.      Overview:   Controlled on amlodipine.      Overview:   Overview:   Overview:   Controlled on amlodipine.    Hyperlipidemia    Hypertension    Impaired instrumental activities of daily living (IADL) 12/22/2021   Impaired mobility and ADLs 12/22/2021   Impairment of balance 01/24/2017   Incontinence of feces 12/08/2021   Insomnia    Insomnia 01/24/2017   Intertrigo    Left lumbar radiculitis 10/19/2012   Low back pain 04/05/2018   Lumbar radiculopathy    improved  after steroid inj and PT 10/21/20 had right L4/5 L5-S1 transforaminal epidural steroid injections   LVH (left ventricular hypertrophy) 05/27/2021   Malignant tumor of colon ([place]) 12/26/2014   Nontraumatic complete tear of right rotator cuff 04/16/2018   Obesity 01/24/2017   Onychomycosis    Onychomycosis 05/17/2021   OSA on CPAP    dx in 2020 after hip surgery CPAP 9-13 cm H20 supplies [employer] PSG 03/18/2019 severe OSA AHI 42.1/hr titration 03/27/2019 rec APAP 9-13 cm H20   Osteoarthritis of knee 01/16/2017   PAD (peripheral artery disease) ([place])    1 x right, 3 x left in 2020-2022 needs bypass on leg had CTA with run off 11/2020 [place]   PAD (peripheral artery disease) (HCC)    with stents   PAD (peripheral artery disease) ([place]) 03/10/2016   B/l stents placed while living in [place] then moved to [place] and vascular MD Dr. Delbert Phenix in [place]  Note 07/27/20 ABI right 0.82 and left 0.35      CTA with runoff severe calcified plaque prox SFA multifocal moderate to severe areas of calcified plaque right popliteal artery with associated moderate to severe areas of stenosis moderate to severe focal areas of stenosis in the tibioperoneal t   Peripheral vascular disease ([place])    Primary osteoarthritis of first carpometacarpal joint of left hand 06/05/2014   Right foot drop    since 2020   Right foot drop 05/17/2021   Rotator cuff tendinitis, right 04/16/2018   Screening for colorectal cancer 11/30/2014   Shoulder pain, right 05/08/2018   Spinal stenosis of lumbar region 10/22/2021   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/2012   Spinal stenosis, lumbar 10/14/2021   Subdeltoid bursitis    Synovitis of ankle 12/15/2016   Thoracic arthritis 03/17/2022   Thoracic spinal stenosis 12/08/2021   Tinea pedis    Tubular adenoma of colon 05/25/2021   Vitamin D deficiency    Past Surgical History:  Procedure Laterality Date   ABDOMINAL AORTOGRAM W/LOWER EXTREMITY N/A  06/01/2021   Procedure: ABDOMINAL AORTOGRAM W/LOWER EXTREMITY;  Surgeon: Serafina Mitchell, MD;  Location: [place] [facility];  Service: Cardiovascular;  Laterality: N/A;   BACK SURGERY     cervical laminoplasty C3-C7 Rex Dr. 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 Edwena Bunde [place] 12 mm d colon polyp, grade 1  IH, moderate diverticulosis tubular   ESOPHAGOGASTRODUODENOSCOPY  05/01/2020   erosive gastritis Dr. Tilford Pillar [place]   PERIPHERAL VASCULAR INTERVENTION  06/01/2021   Procedure: PERIPHERAL VASCULAR INTERVENTION;  Surgeon: Serafina Mitchell, MD;  Location: [place] [facility];  Service: Cardiovascular;;  RT. SFA   right total hip Right    03/05/2019 or 02/26/2020   TUBAL LIGATION     UPPER GI ENDOSCOPY     05/01/20   VASCULAR SURGERY     08/19/19   VASCULAR SURGERY     07/05/19   VASCULAR SURGERY     05/21/2019 angioplasty since 05/2019, 06/2019 and 08/2019 had 2 left leg and 1 on right leg   Patient Active Problem List   Diagnosis Date Noted   Mood disorder ([place]) 06/22/2022   Elevated CK 06/18/2022   Emphysema lung ([place]) 03/17/2022   Lung nodules 03/17/2022   Rheumatoid arthritis ([place]) 10/13/2021   Bilateral hearing loss 07/23/2021   Hypertension associated with diabetes ([place]) 05/25/2021   Glaucoma of both eyes 05/25/2021   OSA on CPAP 05/17/2021   Aortic atherosclerosis ([place]) 05/17/2021   Vitamin B12 deficiency 06/01/2017   Arthritis 01/24/2017   Carpal tunnel syndrome 01/24/2017   Hyperlipidemia 01/24/2017   Lumbar radiculopathy 01/24/2017   Peripheral vascular disease ([place]) 01/24/2017   Vitamin D deficiency 01/24/2017   GERD (gastroesophageal reflux disease) 11/30/2014   CKD (chronic kidney disease) stage 3, GFR 30-59 ml/min (HCC) 11/07/2014   Cervical spondylosis with radiculopathy 06/05/2014   DM (diabetes mellitus), type 2 with  peripheral vascular complications ([place]) [id number]     THERAPY DIAG:  Other low back pain  Chronic pain of left knee  Abnormal posture  Radiculopathy, lumbar region  Muscle weakness (generalized)  Chronic pain of right knee   PCP: Carollee Leitz, MD  REFERRING PROVIDER: Carollee Leitz, MD  REFERRING DIAG: M54.16 (ICD-10-CM) - Lumbar radiculopathy  Rationale for Evaluation and Treatment: Rehabilitation  EVAL THERAPY DIAG:  Other low back pain  Radiculopathy, lumbar region  Chronic pain of left knee  Chronic pain of right knee  Muscle weakness (generalized)  Abnormal posture  ONSET DATE: 06/2021  SUBJECTIVE:                                                                                                                                                                                           SUBJECTIVE STATEMENT: She indicated "not really having anything hurting" upon arrival today.  She reported no real difference in knee pains in last few days.  [facility]   PERTINENT HISTORY:  Arthritis multiple joints, CAD, cervical surgery 2016, CKD,  DDD, DM, history of DVT, GERD, Gout, HTN, Hyperlipidemia, PAD, PVD. History of hip replacement  PAIN:  NPRS scale: 4/10 Pain location:  anterior thighs above knee joints  Pain description: "it changes, sometimes its like a constant pain, sometimes it feels like a throbbing"  Aggravating factors: getting up after I've been sitting still Relieving factors: moving around unless I've been sitting down for a long time   PRECAUTIONS: None  WEIGHT BEARING RESTRICTIONS: No  FALLS:  Has patient fallen in last 6 months? 2 Some fear of falling reported  LIVING ENVIRONMENT: Lives in: House/apartment Stairs: Flight of stairs to bedroom with rail on Lt side, porch has several steps (no handrail) Has following equipment at home: FWW, SPC  OCCUPATION: Retired  PLOF: Independent  PATIENT GOALS: Help pain,  move better.    OBJECTIVE:   PATIENT SURVEYS:  08/17/2022: FOTO update 37  06/29/2022 FOTO eval: 16   predicted:  33  SCREENING FOR RED FLAGS: 06/29/2022 Bowel or bladder incontinence: No Cauda equina syndrome: No  COGNITION: 06/29/2022 Overall cognitive status: WFL normal      SENSATION: 06/29/2022 No specific testing today  MUSCLE LENGTH: 06/29/2022 No specific testing today but visual check in standing posture showed possibility for bilateral hip flexor tightness  POSTURE:  06/29/2022  FHP, rounded shoulders, forward trunk lean, reduced lumbar lordosis, wider base of support.  Difficulty to perform independently without balance support.   LUMBAR ROM:   AROM 06/29/2022  Flexion   Extension Unable to obtain neutral   Right lateral flexion   Left lateral flexion   Right rotation   Left rotation    (Blank rows = not tested)   LOWER EXTREMITY MMT:    MMT Right 06/29/2022 Left 06/29/2022 Right 08/15/2022 Left 08/15/2022  Hip flexion 4/5 4/5 5/5 4/5 c pain  Hip extension      Hip abduction Seated 2+/5 Seated 2+/5    Hip adduction      Hip internal rotation      Hip external rotation      Knee flexion 4/5 4/5 5/5 4/5  Knee extension 4/5 4/5 5/5 4/5  Ankle dorsiflexion 2/5 5/5    Ankle plantarflexion      Ankle inversion      Ankle eversion       (Blank rows = not tested)  LUMBAR SPECIAL TESTS:  06/29/2022 No specific testing today due to time.  FUNCTIONAL TESTS:  08/15/2022:  TUG 27 seconds c FWW performed at end of session  07/04/22 Timed up and go (TUG): 28.97 sec with RW   06/29/2022 Unable to perform sit to stand from 18 inch chair s UE assist and multiple tries/additional time.  Pivot transfer from chair to chair required supervision and additional time.   GAIT: 07/13/2022:  FWW c supervision in clinic 260 ft prior to fatigue requiring rest break.   06/29/2022 Distance walked: 80 ft Assistive device utilized: FWW Level of assistance: SBA Comments:  Posture from standing (listed above) similar in ambulation  Used wheelchair to enter and move within clinic prior to visit and after visit.   TODAY'S TREATMENT:                                                      DATE:08/17/2022 Therex Nustep UE/LE lvl 4 10 mins Seated quad set 5 sec hold  x 15 bilaterally (added to HEP) Seated marching 2 x 10 bilateral (added to HEP)  TherActivity Step up in // bars c CGA 4 inch step x 10 each LE with single hand rail assist  Neuro Re-ed Modified tandem stance c front foot elevated to 4 inch step 1 min x 1 bilateral c CGA to min A at times 4 inch step alternating toe tap x 12 bilateral c CGA to Min A at times to correct loss of balance.   TODAY'S TREATMENT:                                                      DATE:08/15/2022 Therex Nustep UE/LE lvl 4 8 mins  TherActivity TUG x 1 c FWW Ambulation  150 ft x 2 bilateral c FWW for endurance  Neuro Re-ed BIG inspired Fwd stepping without UE x 12 bilateral LE c CGA to min A at times Feet together stance 90 seconds c eyes open CGA, with head turns Lt and Rt x 15 CGA to min A occasionally Modified tandem stance 1 min x 2 bilateral c CGA to min A at times   TODAY'S TREATMENT:                                                      DATE:08/10/2022 TherEx  Seated LAQs green TB x10B Seated clams green TB x15 cues for adequate ROM and not letting band snap her around Seated marches green TB x10 B cues for amplitude of movement  Bridges 2x5  STS no UEs + rocking, cues for head forward x10 from low mat table  Min guarding for balance with no UE support while patient donned/doffied jacked, MinA for sleeve management   TODAY'S TREATMENT:                                                      DATE: 08/03/2022  TherEx: NuStep L5 x 8 min; UE/LE Seated hip abduction isometric hold blue band 2 x 10 bilateral   TherActivity FWW ambulation for endurance/distance improvements c supervision, performed 260  ft  Neuro Re-ed BIG inspired forward stepping x 10 bilateral c CGA and occasional hand assist Lateral stepping in // bars with bilateral hand assist x 3 each way 10 ft each Modified tandem (wider base of support, small step forward) 1 min x 1 bilateral with CGA to min A   PATIENT EDUCATION:  08/17/2022 Education details: HEP, POC Person educated: Patient Education method: Consulting civil engineer, Demonstration, Verbal cues, and Handouts Education comprehension: verbalized understanding, returned demonstration, and verbal cues required  HOME EXERCISE PROGRAM: Access Code: [email or web address] URL: [email or web address] Date: 08/17/2022 Prepared by: Scot Jun  Exercises - Seated Scapular Retraction  - 1-2 x daily - 7 x weekly - 1 sets - 10 reps - 3-5 hold - Seated Hip Adduction Squeeze with Ball  - 1-2 x daily - 7 x weekly - 1 sets - 10 reps - 5 hold - Seated Hip Abduction  - 1-2 x daily - 7 x  weekly - 1-2 sets - 10 reps - Sit to Stand with Counter Support  - 3 x daily - 7 x weekly - 1 sets - 5-10 reps - Seated Quad Set  - 2-3 x daily - 7 x weekly - 1 sets - 10 reps - 5 hold - Seated March  - 1-2 x daily - 7 x weekly - 1-2 sets - 10 reps  ASSESSMENT:  CLINICAL IMPRESSION: Fair performance on balance interventions today with more difficulty in loading Rt leg in activity.   Continued early steady progression in static balance activity as well as strengthening to improve functional movement capacity and stability.    OBJECTIVE IMPAIRMENTS: Abnormal gait, decreased activity tolerance, decreased balance, decreased coordination, decreased endurance, decreased mobility, difficulty walking, decreased ROM, decreased strength, hypomobility, increased fascial restrictions, impaired perceived functional ability, impaired flexibility, improper body mechanics, postural dysfunction, and pain.   ACTIVITY LIMITATIONS: carrying, lifting, bending, standing, squatting, stairs, transfers, toileting,  dressing, reach over head, hygiene/grooming, and locomotion level  PARTICIPATION LIMITATIONS: meal prep, cleaning, laundry, driving, community activity, and occupation  PERSONAL FACTORS: Arthritis multiple joints, CAD, cervical surgery 2016, CKD, DDD, DM, history of DVT, GERD, Gout, HTN, Hyperlipidemia, PAD, PVD, length of time since onset are also affecting patient's functional outcome.   REHAB POTENTIAL: Good  CLINICAL DECISION MAKING: Evolving/moderate complexity  EVALUATION COMPLEXITY: Moderate   GOALS: Goals reviewed with patient? Yes  SHORT TERM GOALS: (target date for Short term goals are 3 weeks 07/20/2022)  1. Patient will demonstrate independent use of home exercise program to maintain progress from in clinic treatments.  Goal status: partially  met  LONG TERM GOALS: (target dates for all long term goals are 12 weeks  09/21/2022 )   1. Patient will demonstrate/report pain at worst less than or equal to 2/10 to facilitate minimal limitation in daily activity secondary to pain symptoms.  Goal status: on going 08/15/2022   2. Patient will demonstrate independent use of home exercise program to facilitate ability to maintain/progress functional gains from skilled physical therapy services.  Goal status: on going 08/15/2022   3. Patient will demonstrate FOTO outcome > or = 33 % to indicate reduced disability due to condition.  Goal status: on going 08/15/2022   4. Patient will demonstrate lumbar extension to neutral s symptoms to facilitate upright standing, walking posture at PLOF s limitation.  Goal status: on going 08/15/2022   5.  Patient will demonstrate/report ability to walk community distances 300 ft or greater with LRAD independent for community integration.   Goal status: on going 08/15/2022   6.  Patient will demonstrate TUG < 13 seconds to reduce fall risk.  Goal status: on going 08/15/2022   7.  Patient will demonstrate bilateral knee MMT 5/5, hip flexion 5/5,  hip abduction > or = 4/5 to facilitate ability to move within house and community.  Goal Status: on going 08/15/2022  PLAN:  PT FREQUENCY: 1-2x/week  PT DURATION: 10 weeks  PLANNED INTERVENTIONS: Therapeutic exercises, Therapeutic activity, Neuro Muscular re-education, Balance training, Gait training, Patient/Family education, Joint mobilization, Stair training, DME instructions, Dry Needling, Electrical stimulation, Cryotherapy, vasopneumatic device, Moist heat, Taping, Traction Ultrasound, Ionotophoresis '4mg'$ /ml Dexamethasone, and Manual therapy.  All included unless contraindicated  PLAN FOR NEXT SESSION:  Static balance improvements, LE strengthening, continued lower step activity to improve stair navigations.     Scot Jun, PT, DPT, OCS, ATC 08/17/22  1:38 PM

## 2022-08-22 ENCOUNTER — Encounter: Payer: Medicare PPO | Admitting: Physical Therapy

## 2022-08-22 ENCOUNTER — Telehealth: Payer: Self-pay | Admitting: Family Medicine

## 2022-08-22 NOTE — Telephone Encounter (Signed)
Patient stated she had been off her medications for about 3 weeks because they were misplaced and she wanted to know if she could restart them, II informed her that yes she can restart the medications.  Caydan Mctavish,cma

## 2022-08-22 NOTE — Telephone Encounter (Signed)
Pt called in asking to speak with Dr. Volanda Napoleon assistant regarding some meds that she hasn't been taking. She's available [phone].

## 2022-08-23 ENCOUNTER — Other Ambulatory Visit: Payer: Self-pay

## 2022-08-23 DIAGNOSIS — I739 Peripheral vascular disease, unspecified: Secondary | ICD-10-CM

## 2022-08-23 DIAGNOSIS — I70213 Atherosclerosis of native arteries of extremities with intermittent claudication, bilateral legs: Secondary | ICD-10-CM

## 2022-08-24 ENCOUNTER — Ambulatory Visit: Payer: Medicare PPO | Admitting: Physical Therapy

## 2022-08-24 ENCOUNTER — Encounter: Payer: Medicare PPO | Admitting: Rehabilitative and Restorative Service Providers"

## 2022-08-24 ENCOUNTER — Telehealth: Payer: Self-pay | Admitting: Rehabilitative and Restorative Service Providers"

## 2022-08-24 ENCOUNTER — Encounter: Payer: Self-pay | Admitting: Physical Therapy

## 2022-08-24 DIAGNOSIS — M25562 Pain in left knee: Secondary | ICD-10-CM

## 2022-08-24 DIAGNOSIS — M25561 Pain in right knee: Secondary | ICD-10-CM

## 2022-08-24 DIAGNOSIS — M5416 Radiculopathy, lumbar region: Secondary | ICD-10-CM

## 2022-08-24 DIAGNOSIS — M5459 Other low back pain: Secondary | ICD-10-CM | POA: Diagnosis not present

## 2022-08-24 DIAGNOSIS — R293 Abnormal posture: Secondary | ICD-10-CM

## 2022-08-24 DIAGNOSIS — M6281 Muscle weakness (generalized): Secondary | ICD-10-CM

## 2022-08-24 DIAGNOSIS — G8929 Other chronic pain: Secondary | ICD-10-CM

## 2022-08-24 NOTE — Therapy (Signed)
OUTPATIENT PHYSICAL THERAPY TREATMENT NOTE   Patient Name: Natalie Watts MRN: [id number] DOB:10/28/1945, 77 y.o., female Today's Date: 08/24/2022  END OF SESSION:   PT End of Session - 08/24/22 1343     Visit Number 9    Number of Visits 24    Date for PT Re-Evaluation 09/21/22    Authorization Type [employer] $20 copay    Authorization - Visit Number 9    Authorization - Number of Visits 12    Progress Note Due on Visit 10    PT Start Time [id number]    PT Stop Time 1410   session ended due to transportation arriving   PT Time Calculation (min) 32 min    Activity Tolerance Patient tolerated treatment well;Patient limited by fatigue    Behavior During Therapy [facility] for tasks assessed/performed                  Past Medical History:  Diagnosis Date   Abnormal CT of the chest 03/20/2022   Abnormal gait 10/13/2021   Abnormal MRI, lumbar spine 10/22/2021   Adjustment disorder with depressed mood 07/23/2021   AKI (acute kidney injury) ([place]) 05/25/2021   Allergic conjunctivitis 10/09/2017   ANA positive 05/24/2021   Arthritis    lumbar, cervical, cervical/lumbar spondylosis   Arthritis of left hip 02/22/2022   Arthritis of left knee 12/22/2021   Bilateral hip joint arthritis 02/22/2022   Bilateral hip pain 12/22/2021   Blood clotting disorder ([place])    Bronchiectasis ([place]) 03/17/2022   CAD (coronary artery disease)    CAD (coronary artery disease) 05/17/2021   Candidiasis of skin 01/24/2017   Cardiac murmur    Carotid bruit    US carotid had 09/03/14 [facility]    Carotid bruit 06/01/2017   Carpal tunnel syndrome    Cataracts, bilateral    Cervical radiculopathy    improved since surgery 2016    Cervical radiculopathy 01/24/2017   Cervical spondylosis without myelopathy 06/05/2014   Cervicalgia 04/05/2018   Chicken pox    Chronic bursitis of left shoulder 05/27/2021   Chronic left shoulder pain 05/25/2021   Chronic pain of left knee 07/23/2021   CKD  (chronic kidney disease) stage 3, GFR 30-59 ml/min ([place])    per Dr Vassie Moselle Su notes [place] noted 12/14/20   Colon polyp    12/18/14 tubulovillous adenoma high grade dysplasia Dr. Loann Quill [facility]    COVID 19+    mid 11/2020   COVID-19    01/17/22   Degenerative disk disease    Diabetes ([place])    Diabetes ([place]) 04/16/2013   Overview:   Last A1c in Oct. 6.2.  Recheck at next visit.  No medications right now. Working on increasing activity.  Next step: add back metformin at a low dose if necessary.       Overview:   Last A1c in Oct. 6.2.  Recheck at next visit.  No medications right now. Working on increasing activity.  Next step: add back metformin at a low dose if necessary.       Overview:   Overview:   Overview:      DVT (deep venous thrombosis) (HCC)    DVT, lower extremity ([place])    left, 05/2013 on Xarelto x 1 month etiology unkown    Dysplastic polyp of colon 12/25/2014   Gait abnormality 12/08/2021   Gallstones 05/17/2021   Gastroesophageal reflux disease without esophagitis 01/24/2017   Generalized osteoarthritis 01/24/2017   Genital herpes  simplex type 2 01/24/2017   GERD (gastroesophageal reflux disease)    Gout    Gout 05/17/2021   H/O myocardial perfusion scan    06/08/20 low risk   Hammer toes, bilateral    Healthcare maintenance 04/16/2013   Overview:   Last flu shot :04/15/13   Heart murmur 06/16/2017   Hiatal hernia 05/17/2021   History of degenerative disc disease 06/01/2017   HSV-2 infection    HTN (hypertension) 04/16/2013   Overview:   Controlled on amlodipine.      Overview:   Controlled on amlodipine.      Overview:   Overview:   Overview:   Controlled on amlodipine.    Hyperlipidemia    Hypertension    Impaired instrumental activities of daily living (IADL) 12/22/2021   Impaired mobility and ADLs 12/22/2021   Impairment of balance 01/24/2017   Incontinence of feces 12/08/2021   Insomnia    Insomnia 01/24/2017   Intertrigo    Left lumbar  radiculitis 10/19/2012   Low back pain 04/05/2018   Lumbar radiculopathy    improved after steroid inj and PT 10/21/20 had right L4/5 L5-S1 transforaminal epidural steroid injections   LVH (left ventricular hypertrophy) 05/27/2021   Malignant tumor of colon ([place]) 12/26/2014   Nontraumatic complete tear of right rotator cuff 04/16/2018   Obesity 01/24/2017   Onychomycosis    Onychomycosis 05/17/2021   OSA on CPAP    dx in 2020 after hip surgery CPAP 9-13 cm H20 supplies [employer] PSG 03/18/2019 severe OSA AHI 42.1/hr titration 03/27/2019 rec APAP 9-13 cm H20   Osteoarthritis of knee 01/16/2017   PAD (peripheral artery disease) ([place])    1 x right, 3 x left in 2020-2022 needs bypass on leg had CTA with run off 11/2020 [place]   PAD (peripheral artery disease) (HCC)    with stents   PAD (peripheral artery disease) ([place]) 03/10/2016   B/l stents placed while living in [place] then moved to [place] and vascular MD Dr. Delbert Phenix in [place]  Note 07/27/20 ABI right 0.82 and left 0.35      CTA with runoff severe calcified plaque prox SFA multifocal moderate to severe areas of calcified plaque right popliteal artery with associated moderate to severe areas of stenosis moderate to severe focal areas of stenosis in the tibioperoneal t   Peripheral vascular disease ([place])    Primary osteoarthritis of first carpometacarpal joint of left hand 06/05/2014   Right foot drop    since 2020   Right foot drop 05/17/2021   Rotator cuff tendinitis, right 04/16/2018   Screening for colorectal cancer 11/30/2014   Shoulder pain, right 05/08/2018   Spinal stenosis of lumbar region 10/22/2021   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/2012   Spinal stenosis, lumbar 10/14/2021   Subdeltoid bursitis    Synovitis of ankle 12/15/2016   Thoracic arthritis 03/17/2022   Thoracic spinal stenosis 12/08/2021   Tinea pedis    Tubular adenoma of colon 05/25/2021   Vitamin D deficiency    Past Surgical  History:  Procedure Laterality Date   ABDOMINAL AORTOGRAM W/LOWER EXTREMITY N/A 06/01/2021   Procedure: ABDOMINAL AORTOGRAM W/LOWER EXTREMITY;  Surgeon: Serafina Mitchell, MD;  Location: [facility];  Service: Cardiovascular;  Laterality: N/A;   BACK SURGERY     cervical laminoplasty C3-C7 Rex Dr. Sheppard Evens fusion/decompression 2/2 myelopathy 03/2014 or 02/2015   COLON SURGERY     colectomy in 2015   COLONOSCOPY  05/01/2020  [place] [facility] [place] Dr. Edwena Bunde [place] 12 mm d colon polyp, grade 1 IH, moderate diverticulosis tubular   ESOPHAGOGASTRODUODENOSCOPY  05/01/2020   erosive gastritis Dr. Tilford Pillar [place]   PERIPHERAL VASCULAR INTERVENTION  06/01/2021   Procedure: PERIPHERAL VASCULAR INTERVENTION;  Surgeon: Serafina Mitchell, MD;  Location: [facility];  Service: Cardiovascular;;  RT. SFA   right total hip Right    03/05/2019 or 02/26/2020   TUBAL LIGATION     UPPER GI ENDOSCOPY     05/01/20   VASCULAR SURGERY     08/19/19   VASCULAR SURGERY     07/05/19   VASCULAR SURGERY     05/21/2019 angioplasty since 05/2019, 06/2019 and 08/2019 had 2 left leg and 1 on right leg   Patient Active Problem List   Diagnosis Date Noted   Mood disorder ([place]) 06/22/2022   Elevated CK 06/18/2022   Emphysema lung ([place]) 03/17/2022   Lung nodules 03/17/2022   Rheumatoid arthritis ([place]) 10/13/2021   Bilateral hearing loss 07/23/2021   Hypertension associated with diabetes ([place]) 05/25/2021   Glaucoma of both eyes 05/25/2021   OSA on CPAP 05/17/2021   Aortic atherosclerosis ([place]) 05/17/2021   Vitamin B12 deficiency 06/01/2017   Arthritis 01/24/2017   Carpal tunnel syndrome 01/24/2017   Hyperlipidemia 01/24/2017   Lumbar radiculopathy 01/24/2017   Peripheral vascular disease ([place]) 01/24/2017   Vitamin D deficiency 01/24/2017   GERD (gastroesophageal reflux disease) 11/30/2014   CKD (chronic kidney disease) stage 3, GFR 30-59 ml/min (HCC) 11/07/2014   Cervical  spondylosis with radiculopathy 06/05/2014   DM (diabetes mellitus), type 2 with peripheral vascular complications ([place]) [id number]     THERAPY DIAG:  Other low back pain  Chronic pain of left knee  Abnormal posture  Radiculopathy, lumbar region  Muscle weakness (generalized)  Chronic pain of right knee   PCP: Carollee Leitz, MD  REFERRING PROVIDER: Carollee Leitz, MD  REFERRING DIAG: M54.16 (ICD-10-CM) - Lumbar radiculopathy  Rationale for Evaluation and Treatment: Rehabilitation  EVAL THERAPY DIAG:  Other low back pain  Radiculopathy, lumbar region  Chronic pain of left knee  Chronic pain of right knee  Muscle weakness (generalized)  Abnormal posture  ONSET DATE: 06/2021  SUBJECTIVE:                                                                                                                                                                                           SUBJECTIVE STATEMENT: The weather is making her knee pain worse today.  Back is feeling better.  PERTINENT HISTORY:  Arthritis multiple joints, CAD, cervical surgery 2016, CKD, DDD,  DM, history of DVT, GERD, Gout, HTN, Hyperlipidemia, PAD, PVD. History of hip replacement  PAIN:  NPRS scale: 9/10 (knees) Pain location:  anterior thighs above knee joints  Pain description: "it changes, sometimes its like a constant pain, sometimes it feels like a throbbing"  Aggravating factors: getting up after I've been sitting still Relieving factors: moving around unless I've been sitting down for a long time   PRECAUTIONS: None  WEIGHT BEARING RESTRICTIONS: No  FALLS:  Has patient fallen in last 6 months? 2 Some fear of falling reported  LIVING ENVIRONMENT: Lives in: House/apartment Stairs: Flight of stairs to bedroom with rail on Lt side, porch has several steps (no handrail) Has following equipment at home: FWW, SPC  OCCUPATION: Retired  PLOF: Independent  PATIENT GOALS: Help pain, move better.     OBJECTIVE:   PATIENT SURVEYS:  08/17/2022: FOTO update 37  06/29/2022 FOTO eval: 16   predicted:  33  SCREENING FOR RED FLAGS: 06/29/2022 Bowel or bladder incontinence: No Cauda equina syndrome: No   SENSATION: 06/29/2022 No specific testing today  MUSCLE LENGTH: 06/29/2022 No specific testing today but visual check in standing posture showed possibility for bilateral hip flexor tightness  POSTURE:  06/29/2022  FHP, rounded shoulders, forward trunk lean, reduced lumbar lordosis, wider base of support.  Difficulty to perform independently without balance support.   LUMBAR ROM:   AROM 06/29/2022  Flexion   Extension Unable to obtain neutral   Right lateral flexion   Left lateral flexion   Right rotation   Left rotation    (Blank rows = not tested)   LOWER EXTREMITY MMT:    MMT Right 06/29/2022 Left 06/29/2022 Right 08/15/2022 Left 08/15/2022  Hip flexion 4/5 4/5 5/5 4/5 c pain  Hip extension      Hip abduction Seated 2+/5 Seated 2+/5    Hip adduction      Hip internal rotation      Hip external rotation      Knee flexion 4/5 4/5 5/5 4/5  Knee extension 4/5 4/5 5/5 4/5  Ankle dorsiflexion 2/5 5/5    Ankle plantarflexion      Ankle inversion      Ankle eversion       (Blank rows = not tested)  LUMBAR SPECIAL TESTS:  06/29/2022 No specific testing today due to time.  FUNCTIONAL TESTS:  08/15/2022:  TUG 27 seconds c FWW performed at end of session  07/04/22 Timed up and go (TUG): 28.97 sec with RW   06/29/2022 Unable to perform sit to stand from 18 inch chair s UE assist and multiple tries/additional time.  Pivot transfer from chair to chair required supervision and additional time.   GAIT: 07/13/2022:  FWW c supervision in clinic 260 ft prior to fatigue requiring rest break.   06/29/2022 Distance walked: 80 ft Assistive device utilized: FWW Level of assistance: SBA Comments: Posture from standing (listed above) similar in ambulation  Used wheelchair to  enter and move within clinic prior to visit and after visit.   TODAY'S TREATMENT:                                                      DATE: 08/22/2022 Therex Nustep UE/LE lvl 5 x 10 mins Leg Press 50# 2x15  Self-Care Discussed current progress and POC with pt as we  are nearing end of initial approved visits with insurance.  Unsure PT has demonstrated significant progress at this time but will formally assess next visit.  Discussed options with pt including d/c, continuation, or referral to specialist.  Will further discuss next visit.  TODAY'S TREATMENT:                                                      DATE:08/17/2022 Therex Nustep UE/LE lvl 4 10 mins Seated quad set 5 sec hold x 15 bilaterally (added to HEP) Seated marching 2 x 10 bilateral (added to HEP)  TherActivity Step up in // bars c CGA 4 inch step x 10 each LE with single hand rail assist  Neuro Re-ed Modified tandem stance c front foot elevated to 4 inch step 1 min x 1 bilateral c CGA to min A at times 4 inch step alternating toe tap x 12 bilateral c CGA to Min A at times to correct loss of balance.   TODAY'S TREATMENT:                                                      DATE:08/15/2022 Therex Nustep UE/LE lvl 4 8 mins  TherActivity TUG x 1 c FWW Ambulation  150 ft x 2 bilateral c FWW for endurance  Neuro Re-ed BIG inspired Fwd stepping without UE x 12 bilateral LE c CGA to min A at times Feet together stance 90 seconds c eyes open CGA, with head turns Lt and Rt x 15 CGA to min A occasionally Modified tandem stance 1 min x 2 bilateral c CGA to min A at times    PATIENT EDUCATION:  08/17/2022 Education details: HEP, POC Person educated: Patient Education method: Consulting civil engineer, Demonstration, Verbal cues, and Handouts Education comprehension: verbalized understanding, returned demonstration, and verbal cues required  HOME EXERCISE PROGRAM: Access Code: [email or web address] URL: [email or web address] Date:  08/17/2022 Prepared by: Scot Jun  Exercises - Seated Scapular Retraction  - 1-2 x daily - 7 x weekly - 1 sets - 10 reps - 3-5 hold - Seated Hip Adduction Squeeze with Ball  - 1-2 x daily - 7 x weekly - 1 sets - 10 reps - 5 hold - Seated Hip Abduction  - 1-2 x daily - 7 x weekly - 1-2 sets - 10 reps - Sit to Stand with Counter Support  - 3 x daily - 7 x weekly - 1 sets - 5-10 reps - Seated Quad Set  - 2-3 x daily - 7 x weekly - 1 sets - 10 reps - 5 hold - Seated March  - 1-2 x daily - 7 x weekly - 1-2 sets - 10 reps  ASSESSMENT:  CLINICAL IMPRESSION: Session limited today due to transportation arriving and needing to end early.  Still needs RW for safe ambulation at this time.    OBJECTIVE IMPAIRMENTS: Abnormal gait, decreased activity tolerance, decreased balance, decreased coordination, decreased endurance, decreased mobility, difficulty walking, decreased ROM, decreased strength, hypomobility, increased fascial restrictions, impaired perceived functional ability, impaired flexibility, improper body mechanics, postural dysfunction, and pain.   ACTIVITY LIMITATIONS: carrying, lifting, bending, standing, squatting, stairs, transfers, toileting, dressing, reach over head, hygiene/grooming, and  locomotion level  PARTICIPATION LIMITATIONS: meal prep, cleaning, laundry, driving, community activity, and occupation  PERSONAL FACTORS: Arthritis multiple joints, CAD, cervical surgery 2016, CKD, DDD, DM, history of DVT, GERD, Gout, HTN, Hyperlipidemia, PAD, PVD, length of time since onset are also affecting patient's functional outcome.   REHAB POTENTIAL: Good  CLINICAL DECISION MAKING: Evolving/moderate complexity  EVALUATION COMPLEXITY: Moderate   GOALS: Goals reviewed with patient? Yes  SHORT TERM GOALS: (target date for Short term goals are 3 weeks 07/20/2022)  1. Patient will demonstrate independent use of home exercise program to maintain progress from in clinic  treatments.  Goal status: partially  met  LONG TERM GOALS: (target dates for all long term goals are 12 weeks  09/21/2022 )   1. Patient will demonstrate/report pain at worst less than or equal to 2/10 to facilitate minimal limitation in daily activity secondary to pain symptoms.  Goal status: on going 08/15/2022   2. Patient will demonstrate independent use of home exercise program to facilitate ability to maintain/progress functional gains from skilled physical therapy services.  Goal status: on going 08/15/2022   3. Patient will demonstrate FOTO outcome > or = 33 % to indicate reduced disability due to condition.  Goal status: on going 08/15/2022   4. Patient will demonstrate lumbar extension to neutral s symptoms to facilitate upright standing, walking posture at PLOF s limitation.  Goal status: on going 08/15/2022   5.  Patient will demonstrate/report ability to walk community distances 300 ft or greater with LRAD independent for community integration.   Goal status: on going 08/15/2022   6.  Patient will demonstrate TUG < 13 seconds to reduce fall risk.  Goal status: on going 08/15/2022   7.  Patient will demonstrate bilateral knee MMT 5/5, hip flexion 5/5, hip abduction > or = 4/5 to facilitate ability to move within house and community.  Goal Status: on going 08/15/2022  PLAN:  PT FREQUENCY: 1-2x/week  PT DURATION: 10 weeks  PLANNED INTERVENTIONS: Therapeutic exercises, Therapeutic activity, Neuro Muscular re-education, Balance training, Gait training, Patient/Family education, Joint mobilization, Stair training, DME instructions, Dry Needling, Electrical stimulation, Cryotherapy, vasopneumatic device, Moist heat, Taping, Traction Ultrasound, Ionotophoresis '4mg'$ /ml Dexamethasone, and Manual therapy.  All included unless contraindicated  PLAN FOR NEXT SESSION:  progress note;  Static balance improvements, LE strengthening, continued lower step activity to improve stair  navigations.     Laureen Abrahams, PT, DPT 08/24/22 2:19 PM

## 2022-08-24 NOTE — Telephone Encounter (Signed)
Called patient after 15 mins no show for appointment today.  Left message and reminder of next appointment time, as well as call back number.    Scot Jun, PT, DPT, OCS, ATC 08/24/22  1:19 PM

## 2022-08-29 ENCOUNTER — Ambulatory Visit: Payer: Medicare PPO | Admitting: Physical Therapy

## 2022-08-29 ENCOUNTER — Encounter: Payer: Self-pay | Admitting: Physical Therapy

## 2022-08-29 DIAGNOSIS — G8929 Other chronic pain: Secondary | ICD-10-CM

## 2022-08-29 DIAGNOSIS — M5416 Radiculopathy, lumbar region: Secondary | ICD-10-CM

## 2022-08-29 DIAGNOSIS — M6281 Muscle weakness (generalized): Secondary | ICD-10-CM

## 2022-08-29 DIAGNOSIS — M5459 Other low back pain: Secondary | ICD-10-CM

## 2022-08-29 DIAGNOSIS — R293 Abnormal posture: Secondary | ICD-10-CM | POA: Diagnosis not present

## 2022-08-29 DIAGNOSIS — M25562 Pain in left knee: Secondary | ICD-10-CM | POA: Diagnosis not present

## 2022-08-29 DIAGNOSIS — M25561 Pain in right knee: Secondary | ICD-10-CM

## 2022-08-29 NOTE — Therapy (Addendum)
OUTPATIENT PHYSICAL THERAPY TREATMENT NOTE PROGRESS NOTE  /DISCHARGE NOTE   Progress Note Reporting Period 06/29/22 to 08/29/22   See note below for Objective Data and Assessment of Progress/Goals.    Patient Name: Natalie Watts MRN: [id number] DOB:1946-02-27, 77 y.o., female Today's Date: 08/29/2022  END OF SESSION:   PT End of Session - 08/29/22 1331     Visit Number 10    Number of Visits 24    Date for PT Re-Evaluation 09/21/22    Authorization Type [employer] $20 copay    Authorization Time Period 06/29/22-09/21/22    Authorization - Visit Number 10    Authorization - Number of Visits 12    Progress Note Due on Visit 20    PT Start Time 1335    PT Stop Time 1415    PT Time Calculation (min) 40 min    Activity Tolerance Patient tolerated treatment well;Patient limited by fatigue    Behavior During Therapy [facility] for tasks assessed/performed                   Past Medical History:  Diagnosis Date   Abnormal CT of the chest 03/20/2022   Abnormal gait 10/13/2021   Abnormal MRI, lumbar spine 10/22/2021   Adjustment disorder with depressed mood 07/23/2021   AKI (acute kidney injury) (HCC) 05/25/2021   Allergic conjunctivitis 10/09/2017   ANA positive 05/24/2021   Arthritis    lumbar, cervical, cervical/lumbar spondylosis   Arthritis of left hip 02/22/2022   Arthritis of left knee 12/22/2021   Bilateral hip joint arthritis 02/22/2022   Bilateral hip pain 12/22/2021   Blood clotting disorder (HCC)    Bronchiectasis (HCC) 03/17/2022   CAD (coronary artery disease)    CAD (coronary artery disease) 05/17/2021   Candidiasis of skin 01/24/2017   Cardiac murmur    Carotid bruit    US carotid had 09/03/14 [facility]    Carotid bruit 06/01/2017   Carpal tunnel syndrome    Cataracts, bilateral    Cervical radiculopathy    improved since surgery 2016    Cervical radiculopathy 01/24/2017   Cervical spondylosis without myelopathy 06/05/2014    Cervicalgia 04/05/2018   Chicken pox    Chronic bursitis of left shoulder 05/27/2021   Chronic left shoulder pain 05/25/2021   Chronic pain of left knee 07/23/2021   CKD (chronic kidney disease) stage 3, GFR 30-59 ml/min (HCC)    per Dr Ailene Ravel Su notes [place] noted 12/14/20   Colon polyp    12/18/14 tubulovillous adenoma high grade dysplasia Dr. Griffith Citron [facility]    COVID 19+    mid 11/2020   COVID-19    01/17/22   Degenerative disk disease    Diabetes (HCC)    Diabetes (HCC) 04/16/2013   Overview:   Last A1c in Oct. 6.2.  Recheck at next visit.  No medications right now. Working on increasing activity.  Next step: add back metformin at a low dose if necessary.       Overview:   Last A1c in Oct. 6.2.  Recheck at next visit.  No medications right now. Working on increasing activity.  Next step: add back metformin at a low dose if necessary.       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      Overview:   Controlled on amlodipine.      Overview:   Overview:   Overview:   Controlled on amlodipine.    Hyperlipidemia    Hypertension    Impaired instrumental activities of daily living (IADL) 12/22/2021   Impaired  mobility and ADLs 12/22/2021   Impairment of balance 01/24/2017   Incontinence of feces 12/08/2021   Insomnia    Insomnia 01/24/2017   Intertrigo    Left lumbar radiculitis 10/19/2012   Low back pain 04/05/2018   Lumbar radiculopathy    improved after steroid inj and PT 10/21/20 had right L4/5 L5-S1 transforaminal epidural steroid injections   LVH (left ventricular hypertrophy) 05/27/2021   Malignant tumor of colon (HCC) 12/26/2014   Nontraumatic complete tear of right rotator cuff 04/16/2018   Obesity 01/24/2017   Onychomycosis    Onychomycosis 05/17/2021   OSA on CPAP    dx in 2020 after hip surgery CPAP 9-13 cm H20 supplies [employer] PSG 03/18/2019 severe OSA AHI 42.1/hr titration 03/27/2019 rec APAP 9-13 cm H20   Osteoarthritis of knee 01/16/2017   PAD (peripheral artery disease) (HCC)    1 x right, 3 x left in 2020-2022 needs bypass on leg had CTA with run off 11/2020 in [place]   PAD (peripheral artery disease) (HCC)    with stents   PAD (peripheral artery disease) (HCC) 03/10/2016   B/l stents placed while living in [place] then moved to [place] and vascular MD Dr. Maricela Bo in [place]  Note 07/27/20 ABI right 0.82 and left 0.35      CTA with runoff severe calcified plaque prox SFA multifocal moderate to severe areas of calcified plaque right popliteal artery with associated moderate to severe areas of stenosis moderate to severe focal areas of stenosis in the tibioperoneal t   Peripheral vascular disease (HCC)    Primary osteoarthritis of first carpometacarpal joint of left hand 06/05/2014   Right foot drop    since 2020   Right foot drop 05/17/2021   Rotator cuff tendinitis, right 04/16/2018   Screening for colorectal cancer 11/30/2014   Shoulder pain, right 05/08/2018   Spinal stenosis of lumbar region 10/22/2021   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/2012   Spinal stenosis, lumbar 10/14/2021   Subdeltoid bursitis    Synovitis of ankle 12/15/2016    Thoracic arthritis 03/17/2022   Thoracic spinal stenosis 12/08/2021   Tinea pedis    Tubular adenoma of colon 05/25/2021   Vitamin D deficiency    Past Surgical History:  Procedure Laterality Date   ABDOMINAL AORTOGRAM W/LOWER EXTREMITY N/A 06/01/2021   Procedure: ABDOMINAL AORTOGRAM W/LOWER EXTREMITY;  Surgeon: Nada Libman, MD;  Location: [facility] IN[facility]IVE [facility];  Service: Cardiovascular;  Laterality: N/A;   BACK SURGERY     cervical  laminoplasty C3-C7 Rex Dr. Liliane Bade fusion/decompression 2/2 myelopathy 03/2014 or 02/2015   COLON SURGERY     colectomy in 2015   COLONOSCOPY  05/01/2020   [place] [facility] [place] Dr. Noah Delaine Adeniji 12 mm d colon polyp, grade 1 IH, moderate diverticulosis tubular   ESOPHAGOGASTRODUODENOSCOPY  05/01/2020   erosive gastritis Dr. Brunetta Jeans [place]   PERIPHERAL VASCULAR INTERVENTION  06/01/2021   Procedure: PERIPHERAL VASCULAR INTERVENTION;  Surgeon: Nada Libman, MD;  Location: [facility];  Service: Cardiovascular;;  RT. SFA   right total hip Right    03/05/2019 or 02/26/2020   TUBAL LIGATION     UPPER GI ENDOSCOPY     05/01/20   VASCULAR SURGERY     08/19/19   VASCULAR SURGERY     07/05/19   VASCULAR SURGERY     05/21/2019 angioplasty since 05/2019, 06/2019 and 08/2019 had 2 left leg and 1 on right leg   Patient Active Problem List   Diagnosis Date Noted   Mood disorder (HCC) 06/22/2022   Elevated CK 06/18/2022   Emphysema lung (HCC) 03/17/2022   Lung nodules 03/17/2022   Rheumatoid arthritis (HCC) 10/13/2021   Bilateral hearing loss 07/23/2021   Hypertension associated with diabetes (HCC) 05/25/2021   Glaucoma of both eyes 05/25/2021   OSA on CPAP 05/17/2021   Aortic atherosclerosis (HCC) 05/17/2021   Vitamin B12 deficiency 06/01/2017   Arthritis 01/24/2017   Carpal tunnel syndrome 01/24/2017   Hyperlipidemia 01/24/2017   Lumbar radiculopathy 01/24/2017   Peripheral vascular disease (HCC) 01/24/2017   Vitamin D  deficiency 01/24/2017   GERD (gastroesophageal reflux disease) 11/30/2014   CKD (chronic kidney disease) stage 3, GFR 30-59 ml/min (HCC) 11/07/2014   Cervical spondylosis with radiculopathy 06/05/2014   DM (diabetes mellitus), type 2 with peripheral vascular complications (HCC) 04/16/2013     THERAPY DIAG:  Other low back pain  Abnormal posture  Chronic pain of left knee  Radiculopathy, lumbar region  Muscle weakness (generalized)  Chronic pain of right knee   PCP: Dana Allan, MD  REFERRING PROVIDER: Dana Allan, MD  REFERRING DIAG: M54.16 (ICD-10-CM) - Lumbar radiculopathy  Rationale for Evaluation and Treatment: Rehabilitation  EVAL THERAPY DIAG:  Other low back pain  Radiculopathy, lumbar region  Chronic pain of left knee  Chronic pain of right knee  Muscle weakness (generalized)  Abnormal posture  ONSET DATE: 06/2021  SUBJECTIVE:  SUBJECTIVE STATEMENT: Rain on Saturday made her feel pretty bad, and Rt leg felt better last week Rt leg is only a 3/10 today  PERTINENT HISTORY:  Arthritis multiple joints, CAD, cervical surgery 2016, CKD, DDD, DM, history of DVT, GERD, Gout, HTN, Hyperlipidemia, PAD, PVD. History of hip replacement  PAIN:  NPRS scale: 3/10 (knees) Pain location:  anterior thighs above knee joints  Pain description: "it changes, sometimes its like a constant pain, sometimes it feels like a throbbing"  Aggravating factors: getting up after I've been sitting still Relieving factors: moving around unless I've been sitting down for a long time   PRECAUTIONS: None  WEIGHT BEARING RESTRICTIONS: No  FALLS:  Has patient fallen in last 6 months? 2 Some fear of falling reported  LIVING ENVIRONMENT: Lives in: House/apartment Stairs: Flight of stairs to  bedroom with rail on Lt side, porch has several steps (no handrail) Has following equipment at home: FWW, SPC  OCCUPATION: Retired  PLOF: Independent  PATIENT GOALS: Help pain, move better.    OBJECTIVE:   PATIENT SURVEYS:  08/17/2022: FOTO update 37  06/29/2022 FOTO eval: 16   predicted:  33  MUSCLE LENGTH: 06/29/2022 No specific testing today but visual check in standing posture showed possibility for bilateral hip flexor tightness  POSTURE:  06/29/2022  FHP, rounded shoulders, forward trunk lean, reduced lumbar lordosis, wider base of support.  Difficulty to perform independently without balance support.   LUMBAR ROM:   AROM 06/29/2022 08/29/22  Extension Unable to obtain neutral  Able to obtain neutral; mild knee pain   (Blank rows = not tested)   LOWER EXTREMITY MMT:    MMT Right 06/29/2022 Left 06/29/2022 Right 08/15/2022 Left 08/15/2022 Right 08/29/22 Left 08/29/22  Hip flexion 4/5 4/5 5/5 4/5 c pain 5/5 3+/5 with pain  Hip abduction Seated 2+/5 Seated 2+/5   Seated 3/5 Seated 3/5  Knee flexion 4/5 4/5 5/5 4/5 5/5 4/5  Knee extension 4/5 4/5 5/5 4/5 5/5 4/5  Ankle dorsiflexion 2/5 5/5       (Blank rows = not tested)  LUMBAR SPECIAL TESTS:  06/29/2022 No specific testing today due to time.  FUNCTIONAL TESTS:  08/29/22:  TUG: 24.40 sec with RW  08/15/2022:  TUG 27 seconds c FWW performed at end of session  07/04/22 Timed up and go (TUG): 28.97 sec with RW   06/29/2022 Unable to perform sit to stand from 18 inch chair s UE assist and multiple tries/additional time.  Pivot transfer from chair to chair required supervision and additional time.   GAIT: 08/29/22: Amb with RW with supervision; decreased velocity and shuffling gait with mild ataxia  07/13/2022:  FWW c supervision in clinic 260 ft prior to fatigue requiring rest break.   06/29/2022 Distance walked: 80 ft Assistive device utilized: FWW Level of assistance: SBA Comments: Posture from standing (listed  above) similar in ambulation  Used wheelchair to enter and move within clinic prior to visit and after visit.   TODAY'S TREATMENT:                                                      DATE: 08/29/2022 Therex Nustep UE/LE lvl 5 x 10 mins TUG, MMT and ROM measurements - see above for details Leg Press 50# 3x15 LAQ 2x10; 3#; performed bil with 3 sec hold  Self-Care Continued discussion about current progress.  Recommend f/u with PCP to discuss neurology referral and/or other specialty.  Pt in agreement.   TODAY'S TREATMENT:                                                      DATE: 08/22/2022 Therex Nustep UE/LE lvl 5 x 10 mins Leg Press 50# 2x15  Self-Care Discussed current progress and POC with pt as we are nearing end of initial approved visits with insurance.  Unsure PT has demonstrated significant progress at this time but will formally assess next visit.  Discussed options with pt including d/c, continuation, or referral to specialist.  Will further discuss next visit.  TODAY'S TREATMENT:                                                      DATE:08/17/2022 Therex Nustep UE/LE lvl 4 10 mins Seated quad set 5 sec hold x 15 bilaterally (added to HEP) Seated marching 2 x 10 bilateral (added to HEP)  TherActivity Step up in // bars c CGA 4 inch step x 10 each LE with single hand rail assist  Neuro Re-ed Modified tandem stance c front foot elevated to 4 inch step 1 min x 1 bilateral c CGA to min A at times 4 inch step alternating toe tap x 12 bilateral c CGA to Min A at times to correct loss of balance.   TODAY'S TREATMENT:                                                      DATE:08/15/2022 Therex Nustep UE/LE lvl 4 8 mins  TherActivity TUG x 1 c FWW Ambulation  150 ft x 2 bilateral c FWW for endurance  Neuro Re-ed BIG inspired Fwd stepping without UE x 12 bilateral LE c CGA to min A at times Feet together stance 90 seconds c eyes open CGA, with head turns Lt and Rt x 15 CGA  to min A occasionally Modified tandem stance 1 min x 2 bilateral c CGA to min A at times    PATIENT EDUCATION:  08/17/2022 Education details: HEP, POC Person educated: Patient Education method: Programmer, multimedia, Demonstration, Verbal cues, and Handouts Education comprehension: verbalized understanding, returned demonstration, and verbal cues required  HOME EXERCISE PROGRAM: Access Code: [email or web address] URL: [email or web address] Date: 08/17/2022 Prepared by: Chyrel Masson  Exercises - Seated Scapular Retraction  - 1-2 x daily - 7 x weekly - 1 sets - 10 reps - 3-5 hold - Seated Hip Adduction Squeeze with Ball  - 1-2 x daily - 7 x weekly - 1 sets - 10 reps - 5 hold - Seated Hip Abduction  - 1-2 x daily - 7 x weekly - 1-2 sets - 10 reps - Sit to Stand with Counter Support  - 3 x daily - 7 x weekly - 1 sets - 5-10 reps - Seated Quad Set  - 2-3 x daily - 7 x weekly - 1 sets - 10 reps - 5 hold -  Seated March  - 1-2 x daily - 7 x weekly - 1-2 sets - 10 reps  ASSESSMENT:  CLINICAL IMPRESSION: Pt tolerated session well today with LTGs assessment and continued focus on strengthening and general mobility.  Overall limited progress has been made at this time.  Pt with some gait abnormalities that may benefit from neurology referral.  Pt to see PCP next week and encouraged her to discuss.  I'll also send message to [place].  Feel at this time need to hold PT and will follow up after ABIs and PCP visit and any subsequent referrals.  Pt in agreement.   OBJECTIVE IMPAIRMENTS: Abnormal gait, decreased activity tolerance, decreased balance, decreased coordination, decreased endurance, decreased mobility, difficulty walking, decreased ROM, decreased strength, hypomobility, increased fascial restrictions, impaired perceived functional ability, impaired flexibility, improper body mechanics, postural dysfunction, and pain.   ACTIVITY LIMITATIONS: carrying, lifting, bending, standing, squatting, stairs,  transfers, toileting, dressing, reach over head, hygiene/grooming, and locomotion level  PARTICIPATION LIMITATIONS: meal prep, cleaning, laundry, driving, community activity, and occupation  PERSONAL FACTORS: Arthritis multiple joints, CAD, cervical surgery 2016, CKD, DDD, DM, history of DVT, GERD, Gout, HTN, Hyperlipidemia, PAD, PVD, length of time since onset are also affecting patient's functional outcome.   REHAB POTENTIAL: Good  CLINICAL DECISION MAKING: Evolving/moderate complexity  EVALUATION COMPLEXITY: Moderate   GOALS: Goals reviewed with patient? Yes  SHORT TERM GOALS: (target date for Short term goals are 3 weeks 07/20/2022)  1. Patient will demonstrate independent use of home exercise program to maintain progress from in clinic treatments.  Goal status: partially  met  LONG TERM GOALS: (target dates for all long term goals are 12 weeks  09/21/2022 )   1. Patient will demonstrate/report pain at worst less than or equal to 2/10 to facilitate minimal limitation in daily activity secondary to pain symptoms.  Goal status: ONGOING 08/29/22   2. Patient will demonstrate independent use of home exercise program to facilitate ability to maintain/progress functional gains from skilled physical therapy services.  Goal status: ONGOING 08/29/22   3. Patient will demonstrate FOTO outcome > or = 33 % to indicate reduced disability due to condition.  Goal status: MET (per 08/17/22 note) 08/29/22   4. Patient will demonstrate lumbar extension to neutral s symptoms to facilitate upright standing, walking posture at PLOF s limitation.  Goal status: MET 08/29/22   5.  Patient will demonstrate/report ability to walk community distances 300 ft or greater with LRAD independent for community integration.   Goal status: ONGOING 08/29/22   6.  Patient will demonstrate TUG < 13 seconds to reduce fall risk.  Goal status: ongoing 08/29/22   7.  Patient will demonstrate bilateral knee MMT 5/5, hip  flexion 5/5, hip abduction > or = 4/5 to facilitate ability to move within house and community.  Goal Status: ONGOING 08/29/22  PLAN:  PT FREQUENCY: 1-2x/week  PT DURATION: 10 weeks  PLANNED INTERVENTIONS: Therapeutic exercises, Therapeutic activity, Neuro Muscular re-education, Balance training, Gait training, Patient/Family education, Joint mobilization, Stair training, DME instructions, Dry Needling, Electrical stimulation, Cryotherapy, vasopneumatic device, Moist heat, Taping, Traction Ultrasound, Ionotophoresis 4mg /ml Dexamethasone, and Manual therapy.  All included unless contraindicated  PLAN FOR NEXT SESSION:  hold PT at this time.     Clarita Crane, PT, DPT 08/29/22 2:35 PM     PHYSICAL THERAPY DISCHARGE SUMMARY  Visits from Start of Care: 10  Current functional level related to goals / functional outcomes: See note   Remaining deficits: See note  Education / Equipment: HEP  Patient goals were partially met. Patient is being discharged due to not returning since the last visit.   Chyrel Masson, PT, DPT, OCS, ATC 09/30/22  10:21 AM

## 2022-08-31 ENCOUNTER — Encounter: Payer: Medicare PPO | Admitting: Physical Therapy

## 2022-09-05 NOTE — Patient Instructions (Incomplete)
It was a pleasure meeting you today. Thank you for allowing me to take part in your health care.  Our goals for today as we discussed include:  Please schedule Medicare annual wellness visit Tuesday Thursday afternoon.  Recommend Shingles vaccine.  This is a 2 dose series and can be given at your local pharmacy.  Please talk to your pharmacist about this.   If you have any questions or concerns, please do not hesitate to call the office at [phone].  I look forward to our next visit and until then take care and stay safe.  Regards,   Carollee Leitz, MD   [facility]

## 2022-09-05 NOTE — Progress Notes (Signed)
SUBJECTIVE:   Chief Complaint  Patient presents with   Medical Management of Chronic Issues     4 week follow up/ continuation of Pt/knees    HPI Patient returns to clinic for follow-up CK.   Has been admitted to the hospital 12/5 status post mechanical fall, noted to have elevated CK.  Pravastatin was discontinued at that time.  Advised repeat CK in January has not had repeat.  History of lung nodule Due for repeat CT chest 9/24.  Follows with pulmonology  Gait disturbance Patient has had issues with gait disturbance for quite some time.  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  She has had extensive previous imaging including MRI of C-spine which showed prior decompression and fusion from C3-C7 MRI brain at that time did not show any acute abnormalities, moderate small vessel disease and multiple areas of chronic microhemorrhage throughout the white matter, basal ganglia and, thalamus and pons.  She was referred by neurology to neurosurgery for thoracic spinal stenosis.  Recently she has been followed by physical therapy who had recommended a neurology consult for no improvement with rehab.  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  Followed by Dr. Tamala Julian and last seen 11/23.  Recommendations at that time continuing Plavix and low-dose Xarelto for secondary prevention.    Hyperlipidemia Had been taking Pravachol but was discontinued after a fall and elevated CK.  She has not restarted her Pravachol.  Had not return for repeat CK at recommended time.  Continues to take Zetia 10 mg daily.  Diabetes type II Asymptomatic.  Prescribed Lantus 40 units daily, Tresiba 1.5 units weekly and Jardiance 10 mg daily.  Patient is not sure if she is taking these medications.  Hypertension Asymptomatic prescribe losartan 100 mg daily and nebivolol 5 mg daily.  Does  not check blood pressures at home.  Rheumatoid arthritis. Prescribed Areva 20 mg daily.  Follows with rheumatology   PERTINENT PMH / PSH: Hypertension Diabetes type 2 Emphysema Lung nodule CKD stage III Thoracic spondylosis History of falls History of impaired gait and mobility History of DVT  OBJECTIVE:  BP 130/70   Pulse 84   Temp 98.3 F (36.8 C) (Oral)   Ht 5\' 1"  (1.549 m)   Wt 133 lb 6.4 oz (60.5 kg)   SpO2 98%   BMI 25.21 kg/m    Physical Exam Constitutional:      General: She is not in acute distress.    Appearance: She is normal weight. She is not ill-appearing.  HENT:     Head: Normocephalic.  Eyes:     General: No visual field deficit.    Conjunctiva/sclera: Conjunctivae normal.  Cardiovascular:     Rate and Rhythm: Normal rate and regular rhythm.     Pulses: Normal pulses.  Pulmonary:     Effort: Pulmonary effort is normal.     Breath sounds: Normal breath sounds.  Abdominal:     General: Bowel sounds are normal.  Neurological:     Mental Status: She is alert and oriented to person, place, and time. Mental status is at baseline.     Cranial Nerves: No cranial nerve deficit, dysarthria or facial asymmetry.     Sensory: Sensory deficit present.     Motor: No tremor, atrophy, abnormal muscle tone or pronator drift.     Coordination: Romberg sign positive.     Gait: Gait abnormal.  Deep Tendon Reflexes: Reflexes are normal and symmetric.     Reflex Scores:      Patellar reflexes are 2+ on the right side and 2+ on the left side. Psychiatric:        Mood and Affect: Mood normal.        Behavior: Behavior normal.        Thought Content: Thought content normal.        Judgment: Judgment normal.     ASSESSMENT/PLAN:  Screening mammogram, encounter for -     3D Screening Mammogram, Left and Right  Type 2 diabetes mellitus with stage 3 chronic kidney disease, unspecified whether long term insulin use, unspecified whether stage 3a or 3b CKD  (HCC) -     Microalbumin / creatinine urine ratio -     Hemoglobin A1c  Elevated CK Assessment & Plan: Acute.  Stable.  Asymptomatic.  Noted during ED visit from 12/05. Pravachol discontinued at that time Continue to hold Pravachol Repeat CK  Orders: -     CK  Gait disturbance -     CBC with Differential/Platelet -     Comprehensive metabolic panel -     TSH -     Vitamin B12 -     VITAMIN D 25 Hydroxy (Vit-D Deficiency, Fractures) -     Vitamin B1  Diabetic eye exam ([place]) -     Ambulatory referral to Ophthalmology  Cervical myelopathy ([place])  Rheumatoid arthritis of other site, unspecified whether rheumatoid factor present [facility]) Assessment & Plan: Chronic.  Stable.  Last seen rheumatology 01/24  Continue Arava 20 mg daily. Follows with rheumatology    Pulmonary emphysema, unspecified emphysema type ([place]) Assessment & Plan: Chronic.  Stable.   Follows with pulmonology   Mood disorder [facility]) Assessment & Plan: Chronic.  Stable.  Denies SI/HI. Continue duloxetine 30 mg daily    Peripheral vascular disease ([place]) Assessment & Plan: Chronic.  Stable.  Currently on DAPT Follows with cardiology, Dr. Tamala Julian Continue Plavix 75 mg daily Continue Xarelto 2.5 mg twice daily Follow-up with cardiology as scheduled. Has asked patient to bring in all medications for review at next visit   Stage 3b chronic kidney disease [facility]) Assessment & Plan: Chronic.  Stable.  Recent GFR 55.85 Continue to monitor.  Avoid nephrotoxic agents.   Aortic atherosclerosis (HCC) Assessment & Plan: Chronic.  Stable.  Pravachol on hold for elevated enzymes.  On ezetimibe. Continue ezetimibe 10 mg daily Repeat CK Plan to switch Lavacol to Crestor 40 mg     DM (diabetes mellitus), type 2 with peripheral vascular complications [facility]) Assessment & Plan: Chronic.  Stable.  Last A1c 7.7 at goal for age.  8 A1c 8.9. Review of medication looks as if the Trulicity has never been initiated.   Will need patient to bring in all medications at next visit for review. Continue prescribed Lantus 40 units but administer daily,  Jardiance 10 mg daily does not appear patient picked up medications but will need to review Continue duloxetine 30 mg daily Ophthalmology referral sent for diabetic exam      Lung nodules Assessment & Plan: Chronic.  Stable. Repeat CT chest 02/2023, sooner if symptomatic.   Mixed hyperlipidemia Assessment & Plan: Chronic. Continue to hold Pravachol for elevated CK. If CK lower can switch to Crestor as recommended by cardiology to lower LDL further Continue Zetia 10 mg daily Repeat lipids annually    PDMP reviewed  Return in about 2 weeks (around 09/20/2022) for PCP.  Carollee Leitz, MD

## 2022-09-06 ENCOUNTER — Other Ambulatory Visit: Payer: Self-pay

## 2022-09-06 ENCOUNTER — Ambulatory Visit: Payer: Medicare PPO | Admitting: Family Medicine

## 2022-09-06 ENCOUNTER — Encounter: Payer: Self-pay | Admitting: Family Medicine

## 2022-09-06 VITALS — BP 130/70 | HR 84 | Temp 98.3°F | Ht 61.0 in | Wt 133.4 lb

## 2022-09-06 DIAGNOSIS — M069 Rheumatoid arthritis, unspecified: Secondary | ICD-10-CM

## 2022-09-06 DIAGNOSIS — I700 Atherosclerosis of aorta: Secondary | ICD-10-CM

## 2022-09-06 DIAGNOSIS — N183 Chronic kidney disease, stage 3 unspecified: Secondary | ICD-10-CM

## 2022-09-06 DIAGNOSIS — E1122 Type 2 diabetes mellitus with diabetic chronic kidney disease: Secondary | ICD-10-CM | POA: Diagnosis not present

## 2022-09-06 DIAGNOSIS — E1151 Type 2 diabetes mellitus with diabetic peripheral angiopathy without gangrene: Secondary | ICD-10-CM

## 2022-09-06 DIAGNOSIS — R918 Other nonspecific abnormal finding of lung field: Secondary | ICD-10-CM

## 2022-09-06 DIAGNOSIS — Z1231 Encounter for screening mammogram for malignant neoplasm of breast: Secondary | ICD-10-CM

## 2022-09-06 DIAGNOSIS — R269 Unspecified abnormalities of gait and mobility: Secondary | ICD-10-CM

## 2022-09-06 DIAGNOSIS — R748 Abnormal levels of other serum enzymes: Secondary | ICD-10-CM | POA: Diagnosis not present

## 2022-09-06 DIAGNOSIS — G959 Disease of spinal cord, unspecified: Secondary | ICD-10-CM | POA: Diagnosis not present

## 2022-09-06 DIAGNOSIS — N1832 Chronic kidney disease, stage 3b: Secondary | ICD-10-CM

## 2022-09-06 DIAGNOSIS — F39 Unspecified mood [affective] disorder: Secondary | ICD-10-CM

## 2022-09-06 DIAGNOSIS — Z0100 Encounter for examination of eyes and vision without abnormal findings: Secondary | ICD-10-CM | POA: Diagnosis not present

## 2022-09-06 DIAGNOSIS — E663 Overweight: Secondary | ICD-10-CM | POA: Insufficient documentation

## 2022-09-06 DIAGNOSIS — E119 Type 2 diabetes mellitus without complications: Secondary | ICD-10-CM

## 2022-09-06 DIAGNOSIS — J439 Emphysema, unspecified: Secondary | ICD-10-CM

## 2022-09-06 DIAGNOSIS — I739 Peripheral vascular disease, unspecified: Secondary | ICD-10-CM

## 2022-09-06 DIAGNOSIS — E782 Mixed hyperlipidemia: Secondary | ICD-10-CM

## 2022-09-06 LAB — CBC WITH DIFFERENTIAL/PLATELET
Basophils Absolute: 0 10*3/uL (ref 0.0–0.1)
Basophils Relative: 0.4 % (ref 0.0–3.0)
Eosinophils Absolute: 0.1 10*3/uL (ref 0.0–0.7)
Eosinophils Relative: 1.2 % (ref 0.0–5.0)
HCT: 39.3 % (ref 36.0–46.0)
Hemoglobin: 12.7 g/dL (ref 12.0–15.0)
Lymphocytes Relative: 31.3 % (ref 12.0–46.0)
Lymphs Abs: 2 10*3/uL (ref 0.7–4.0)
MCHC: 32.3 g/dL (ref 30.0–36.0)
MCV: 89.1 fl (ref 78.0–100.0)
Monocytes Absolute: 0.5 10*3/uL (ref 0.1–1.0)
Monocytes Relative: 7.6 % (ref 3.0–12.0)
Neutro Abs: 3.8 10*3/uL (ref 1.4–7.7)
Neutrophils Relative %: 59.5 % (ref 43.0–77.0)
Platelets: 163 10*3/uL (ref 150.0–400.0)
RBC: 4.41 Mil/uL (ref 3.87–5.11)
RDW: 14.9 % (ref 11.5–15.5)
WBC: 6.4 10*3/uL (ref 4.0–10.5)

## 2022-09-06 LAB — COMPREHENSIVE METABOLIC PANEL
ALT: 13 U/L (ref 0–35)
AST: 21 U/L (ref 0–37)
Albumin: 4 g/dL (ref 3.5–5.2)
Alkaline Phosphatase: 94 U/L (ref 39–117)
BUN: 16 mg/dL (ref 6–23)
CO2: 28 mEq/L (ref 19–32)
Calcium: 9.7 mg/dL (ref 8.4–10.5)
Chloride: 102 mEq/L (ref 96–112)
Creatinine, Ser: 0.98 mg/dL (ref 0.40–1.20)
GFR: 55.85 mL/min — ABNORMAL LOW (ref >60.00)
Glucose, Bld: 246 mg/dL — ABNORMAL HIGH (ref 70–99)
Potassium: 4 mEq/L (ref 3.5–5.1)
Sodium: 141 mEq/L (ref 135–145)
Total Bilirubin: 0.5 mg/dL (ref 0.2–1.2)
Total Protein: 7.1 g/dL (ref 6.0–8.3)

## 2022-09-06 LAB — HEMOGLOBIN A1C: Hgb A1c MFr Bld: 8.9 % — ABNORMAL HIGH (ref 4.6–6.5)

## 2022-09-06 LAB — MICROALBUMIN / CREATININE URINE RATIO
Creatinine,U: 152.1 mg/dL
Microalb Creat Ratio: 15.7 mg/g (ref 0.0–30.0)
Microalb, Ur: 23.9 mg/dL — ABNORMAL HIGH (ref 0.0–1.9)

## 2022-09-06 LAB — VITAMIN D 25 HYDROXY (VIT D DEFICIENCY, FRACTURES): VITD: 23.5 ng/mL — ABNORMAL LOW (ref 30.00–100.00)

## 2022-09-06 LAB — VITAMIN B12: Vitamin B-12: 186 pg/mL — ABNORMAL LOW (ref 211–911)

## 2022-09-06 LAB — CK: Total CK: 1279 U/L — ABNORMAL HIGH (ref 7–177)

## 2022-09-06 LAB — TSH: TSH: 1.8 u[IU]/mL (ref 0.35–5.50)

## 2022-09-07 ENCOUNTER — Telehealth: Payer: Self-pay

## 2022-09-07 NOTE — Telephone Encounter (Signed)
-----   Message from Carollee Leitz, MD sent at 09/06/2022 12:46 PM EDT ----- Please call patient to let her know I cancelled the Neurology consult as she has already established care with Neurology.  She can call to schedule an appointment with them to evaluate balance concern.  She also needs to call and schedule appointment with Neurosurgery for her gait issue  Thanks  T

## 2022-09-07 NOTE — Telephone Encounter (Signed)
I called and spoke with the patient and informed her that the provider cancelled the referral to Neuro because she is established with Dr. Krista Blue at [facility] and she needed to call and schedule a visit, I gave her the name and address as well as the number to call and schedule.  She understood.  Natalie Watts,cma

## 2022-09-09 ENCOUNTER — Encounter: Payer: Self-pay | Admitting: Family Medicine

## 2022-09-09 DIAGNOSIS — G959 Disease of spinal cord, unspecified: Secondary | ICD-10-CM | POA: Insufficient documentation

## 2022-09-09 MED ORDER — VITAMIN B-12 1000 MCG PO TABS: 1000.0000 ug | ORAL_TABLET | Freq: Every day | ORAL | 1 refills | Status: AC

## 2022-09-09 NOTE — Assessment & Plan Note (Signed)
Chronic.  Stable.  Pravachol on hold for elevated enzymes.  On ezetimibe. Continue ezetimibe 10 mg daily Repeat CK Plan to switch Lavacol to Crestor 40 mg

## 2022-09-09 NOTE — Assessment & Plan Note (Signed)
Chronic.  Stable.  Recent GFR 55.85 Continue to monitor.  Avoid nephrotoxic agents.

## 2022-09-09 NOTE — Assessment & Plan Note (Signed)
Chronic.  Stable.  Last seen rheumatology 01/24  Continue Arava 20 mg daily. Follows with rheumatology

## 2022-09-09 NOTE — Assessment & Plan Note (Signed)
Chronic.  Stable.  Last A1c 7.7 at goal for age.  8 A1c 8.9. Review of medication looks as if the Trulicity has never been initiated.  Will need patient to bring in all medications at next visit for review. Continue prescribed Lantus 40 units but administer daily,  Jardiance 10 mg daily does not appear patient picked up medications but will need to review Continue duloxetine 30 mg daily Ophthalmology referral sent for diabetic exam

## 2022-09-09 NOTE — Assessment & Plan Note (Signed)
Chronic.  Stable. Repeat CT chest 02/2023, sooner if symptomatic. 

## 2022-09-09 NOTE — Assessment & Plan Note (Addendum)
Chronic.  Stable. Follows with pulmonology 

## 2022-09-09 NOTE — Assessment & Plan Note (Signed)
Chronic.  Stable.  Currently on DAPT Follows with cardiology, Dr. Tamala Julian Continue Plavix 75 mg daily Continue Xarelto 2.5 mg twice daily Follow-up with cardiology as scheduled. Has asked patient to bring in all medications for review at next visit

## 2022-09-09 NOTE — Assessment & Plan Note (Signed)
Chronic. Continue to hold Pravachol for elevated CK. If CK lower can switch to Crestor as recommended by cardiology to lower LDL further Continue Zetia 10 mg daily Repeat lipids annually

## 2022-09-09 NOTE — Assessment & Plan Note (Signed)
Acute.  Stable.  Asymptomatic.  Noted during ED visit from 12/05. Pravachol discontinued at that time Continue to hold Pravachol Repeat CK 

## 2022-09-09 NOTE — Assessment & Plan Note (Signed)
Chronic.  Stable.  Denies SI/HI. Continue duloxetine 30 mg daily

## 2022-09-10 LAB — VITAMIN B1: Vitamin B1 (Thiamine): 6 nmol/L — ABNORMAL LOW (ref 8–30)

## 2022-09-12 ENCOUNTER — Encounter: Payer: Self-pay | Admitting: Physician Assistant

## 2022-09-12 ENCOUNTER — Encounter: Payer: Self-pay | Admitting: Family Medicine

## 2022-09-12 ENCOUNTER — Ambulatory Visit (INDEPENDENT_AMBULATORY_CARE_PROVIDER_SITE_OTHER)
Admission: RE | Admit: 2022-09-12 | Discharge: 2022-09-12 | Disposition: A | Discharge status: Home / Self Care | Payer: Medicare PPO | Source: Ambulatory Visit | Attending: Surgery | Admitting: Surgery

## 2022-09-12 ENCOUNTER — Ambulatory Visit (INDEPENDENT_AMBULATORY_CARE_PROVIDER_SITE_OTHER): Payer: Medicare PPO | Admitting: Physician Assistant

## 2022-09-12 ENCOUNTER — Ambulatory Visit (HOSPITAL_COMMUNITY)
Admission: RE | Admit: 2022-09-12 | Discharge: 2022-09-12 | Disposition: A | Discharge status: Home / Self Care | Payer: Medicare PPO | Source: Ambulatory Visit | Attending: Surgery | Admitting: Surgery

## 2022-09-12 VITALS — BP 172/95 | HR 88 | Temp 98.0°F | Resp 14

## 2022-09-12 DIAGNOSIS — I70213 Atherosclerosis of native arteries of extremities with intermittent claudication, bilateral legs: Secondary | ICD-10-CM

## 2022-09-12 DIAGNOSIS — I739 Peripheral vascular disease, unspecified: Secondary | ICD-10-CM

## 2022-09-12 LAB — VAS US ABI WITH/WO TBI
Left ABI: 0.76
Right ABI: 1.34

## 2022-09-12 NOTE — Progress Notes (Signed)
HISTORY AND PHYSICAL     CC:  follow up. Requesting Provider:  Carollee Leitz, MD  HPI: This is a 77 y.o. female who is here today for follow up for PAD.   She was first seen by Dr. Trula Slade on 05/24/2021 to establish vascular care.   In December 2020 in [place] she underwent placement of bilateral superficial femoral artery stenting.  She moved to [place] and in March 2021 she underwent angioplasty of in-stent stenosis on the left.  She had subsequently gone to occlude the stents in her left leg.    Given her claudication, she was taken to the [facility] lab on 06/01/2021 and underwent aortogram with stenting to the right SFA. Her left SFA and proximal popliteal artery was occluded. She was kept overnight due to hematoma in the left groin.  U/s revealed no evidence of psa, AVF or DVT.  She was discharged home the following morning.    Patient has foot drop in her right leg secondary to a low back issue.  She has a history of DVT on the left in 2014.  She suffers from chronic stage III renal insufficiency.   Pt was last seen 12/27/2021 and at that time, and she was having worsening thigh bilateral thigh pain that was present for about 2-3 months.  She had seen 4 neurosurgeons and 3 of them recommended surgery on her back.  She has a known, long standing history of cervical, thoracic, and lumbar spine issues.   The pt returns today for follow up & here with her daughter.  Pt states that her biggest complaint is bilateral knee pain.  She states it hurts when they touch.  She does not really have any cramping with walking or rest pain.  She states she does have some neuropathy on the bottom of her feet.  She states that one of her doctors stopped her statin but she is unsure why.  She states she had been on this for years.   The pt is not currently on a statin for cholesterol management.    The pt is not on an aspirin.    Other AC:  Plavix The pt is on CCB, ARB, BB for hypertension.  The pt does  have  diabetes. Tobacco hx:  former-quit 10 years ago  Pt does not have family hx of AAA.  Past Medical History:  Diagnosis Date   Abnormal CT of the chest 03/20/2022   Abnormal gait 10/13/2021   Abnormal MRI, lumbar spine 10/22/2021   Adjustment disorder with depressed mood 07/23/2021   AKI (acute kidney injury) ([place]) 05/25/2021   Allergic conjunctivitis 10/09/2017   ANA positive 05/24/2021   Arthritis    lumbar, cervical, cervical/lumbar spondylosis   Arthritis of left hip 02/22/2022   Arthritis of left knee 12/22/2021   Bilateral hip joint arthritis 02/22/2022   Bilateral hip pain 12/22/2021   Blood clotting disorder ([place])    Bronchiectasis ([place]) 03/17/2022   CAD (coronary artery disease)    CAD (coronary artery disease) 05/17/2021   Candidiasis of skin 01/24/2017   Cardiac murmur    Carotid bruit    US carotid had 09/03/14 [facility]    Carotid bruit 06/01/2017   Carpal tunnel syndrome    Cataracts, bilateral    Cervical radiculopathy    improved since surgery 2016    Cervical radiculopathy 01/24/2017   Cervical spondylosis without myelopathy 06/05/2014   Cervicalgia 04/05/2018   Chicken pox    Chronic bursitis of left  shoulder 05/27/2021   Chronic left shoulder pain 05/25/2021   Chronic pain of left knee 07/23/2021   CKD (chronic kidney disease) stage 3, GFR 30-59 ml/min (HCC)    per Dr Vassie Moselle Su notes [place] noted 12/14/20   Colon polyp    12/18/14 tubulovillous adenoma high grade dysplasia Dr. Loann Quill [facility]    COVID 19+    mid 11/2020   COVID-19    01/17/22   Degenerative disk disease    Diabetes ([place])    Diabetes ([place]) 04/16/2013   Overview:   Last A1c in Oct. 6.2.  Recheck at next visit.  No medications right now. Working on increasing activity.  Next step: add back metformin at a low dose if necessary.       Overview:   Last A1c in Oct. 6.2.  Recheck at next visit.  No medications right now. Working on increasing activity.  Next step:  add back metformin at a low dose if necessary.       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      Overview:   Controlled on amlodipine.      Overview:   Overview:   Overview:   Controlled on amlodipine.    Hyperlipidemia    Hypertension    Impaired instrumental activities of daily living (IADL) 12/22/2021   Impaired mobility and ADLs 12/22/2021   Impairment of balance 01/24/2017   Incontinence of feces 12/08/2021   Insomnia    Insomnia 01/24/2017   Intertrigo    Left lumbar radiculitis 10/19/2012   Low back pain 04/05/2018   Lumbar radiculopathy    improved after steroid inj and PT 10/21/20 had right L4/5 L5-S1 transforaminal epidural steroid injections   LVH (left ventricular hypertrophy) 05/27/2021   Malignant tumor of colon ([place]) 12/26/2014   Nontraumatic complete tear of right rotator cuff 04/16/2018   Obesity 01/24/2017   Onychomycosis    Onychomycosis 05/17/2021   OSA on CPAP    dx in 2020 after hip surgery CPAP 9-13 cm H20 supplies [employer] PSG 03/18/2019 severe OSA AHI 42.1/hr titration 03/27/2019 rec APAP 9-13 cm H20   Osteoarthritis of knee 01/16/2017   PAD (peripheral artery disease) ([place])    1 x right,  3 x left in 2020-2022 needs bypass on leg had CTA with run off 11/2020 [place]   PAD (peripheral artery disease) (HCC)    with stents   PAD (peripheral artery disease) ([place]) 03/10/2016   B/l stents placed while living in [place] then moved to [place] and vascular [place] Dr. Delbert Phenix in [place]  Note 07/27/20 ABI right 0.82 and left  0.88      CTA with runoff severe calcified plaque prox SFA multifocal moderate to severe areas of calcified plaque right popliteal artery with associated moderate to severe areas of stenosis moderate to severe focal areas of stenosis in the tibioperoneal t   Peripheral vascular disease ([place])    Primary osteoarthritis of first carpometacarpal joint of left hand 06/05/2014   Right foot drop    since 2020   Right foot drop 05/17/2021   Rotator cuff tendinitis, right 04/16/2018   Screening for colorectal cancer 11/30/2014   Shoulder pain, right 05/08/2018   Spinal stenosis of lumbar region 10/22/2021   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/2012   Spinal stenosis, lumbar 10/14/2021   Subdeltoid bursitis    Synovitis of ankle 12/15/2016   Thoracic arthritis 03/17/2022   Thoracic spinal stenosis 12/08/2021   Tinea pedis    Tubular adenoma of colon 05/25/2021   Vitamin D deficiency     Past Surgical History:  Procedure Laterality Date   ABDOMINAL AORTOGRAM W/LOWER EXTREMITY N/A 06/01/2021   Procedure: ABDOMINAL AORTOGRAM W/LOWER EXTREMITY;  Surgeon: Serafina Mitchell, MD;  Location: [facility];  Service: Cardiovascular;  Laterality: N/A;   BACK SURGERY     cervical laminoplasty C3-C7 Rex Dr. 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 Edwena Bunde [place] 12 mm d colon polyp, grade 1 IH, moderate diverticulosis tubular   ESOPHAGOGASTRODUODENOSCOPY  05/01/2020   erosive gastritis Dr. Tilford Pillar [place]    PERIPHERAL VASCULAR INTERVENTION  06/01/2021   Procedure: PERIPHERAL VASCULAR INTERVENTION;  Surgeon: Serafina Mitchell, MD;  Location: [facility];  Service: Cardiovascular;;  RT. SFA   right total hip Right    03/05/2019 or 02/26/2020   TUBAL LIGATION     UPPER GI ENDOSCOPY     05/01/20   VASCULAR SURGERY     08/19/19   VASCULAR SURGERY     07/05/19   VASCULAR SURGERY     05/21/2019 angioplasty since 05/2019, 06/2019 and 08/2019 had 2 left leg and 1 on right leg    Allergies  Allergen Reactions   Lisinopril Anaphylaxis, Swelling and Other (See Comments)     Tongue swelling    Sulfamethoxazole-Trimethoprim Other (See Comments)    Worked against diabetic medication   Dapagliflozin Diarrhea    diarrhea   Gabapentin    Glipizide Diarrhea    Diarrhea    Metformin Diarrhea and Other (See Comments)   Metformin And Related     GI sx's diarrhea   Penicillin G Other (See Comments)   Penicillins     Childhood   Tramadol Nausea Only and Nausea And Vomiting    Current Outpatient Medications  Medication Sig Dispense Refill   acetaminophen (TYLENOL) 500 MG tablet Take 1,000 mg by mouth 2 (two) times daily as needed for moderate pain or mild pain. Rapid release     amLODipine (NORVASC) 5 MG tablet Take 1 tablet (5 mg total) by mouth daily. 90 tablet 3   B-D UF III MINI PEN NEEDLES 31G X 5 MM MISC Inject 1 each into the skin 2 (two) times daily. Or preferred choice of the patient 300 each 3   Blood Glucose Monitoring Suppl (ONE TOUCH ULTRA 2) w/Device KIT Used to check blood sugars daily. 1 each 0   Cholecalciferol (VITAMIN D3) 50 MCG (  2000 UT) capsule Take 2,000 Units by mouth daily.     clobetasol cream (TEMOVATE) [id number] % Apply 1 Application topically 2 (two) times daily. Legs and hands prn 30 g 5   clopidogrel (PLAVIX) 75 MG tablet TAKE 1 TABLET BY MOUTH EVERY DAY 90 tablet 1   cyanocobalamin (VITAMIN B12) 1000 MCG tablet Take 1 tablet (1,000 mcg total) by mouth daily. 90 tablet 1    Dulaglutide (TRULICITY) 1.5 0000000 SOPN Inject 1.5 mg into the skin once a week. D/c 0.75 3 mL 2   DULoxetine (CYMBALTA) 30 MG capsule Take 1 capsule (30 mg total) by mouth daily. 90 capsule 3   empagliflozin (JARDIANCE) 10 MG TABS tablet Take 1 tablet (10 mg total) by mouth daily before breakfast.     ezetimibe (ZETIA) 10 MG tablet Take 1 tablet (10 mg total) by mouth daily. 90 tablet 3   Lancets Ultra Fine MISC 1 Device by Does not apply route 2 (two) times daily as needed. Preferred for her device 180 each 3   LANTUS SOLOSTAR 100 UNIT/ML Solostar Pen Inject 40 Units into the skin daily. 15 mL 11   leflunomide (ARAVA) 20 MG tablet Take 20 mg by mouth daily. rheumatology     losartan (COZAAR) 100 MG tablet Take 1 tablet (100 mg total) by mouth daily.     nebivolol (BYSTOLIC) 5 MG tablet Take 1 tablet (5 mg total) by mouth daily. Dc tenoretic 90 tablet 3   potassium chloride SA (KLOR-CON M) 20 MEQ tablet Take 1 tablet (20 mEq total) by mouth every other day. 45 tablet 3   XARELTO 2.5 MG TABS tablet Take 1 tablet (2.5 mg total) by mouth 2 (two) times daily. 60 tablet 11   No current facility-administered medications for this visit.    Family History  Problem Relation Age of Onset   Hypertension Mother    Arthritis Mother    Cancer Father        colon cancer dx'ed died age 62   Hypertension Sister    Arthritis Sister    Cancer Sister        died in 48s brain tumor h/o lung cancer smoker died 2015/10/02    Early death Sister    Breast cancer Maternal Grandmother    Cancer Maternal Grandmother        breast dx older age    Hypertension Maternal Grandmother    Breast cancer Other        maternal neice   Cancer Other        died in 67s    Hypertension Daughter    Hypertension Daughter     Social History   Socioeconomic History   Marital status: Divorced    Spouse name: Not on file   Number of children: Not on file   Years of education: Not on file   Highest education level: Not on  file  Occupational History   Not on file  Tobacco Use   Smoking status: Former    Packs/day: 1.00    Years: 45.00    Additional pack years: 0.00    Total pack years: 45.00    Types: Cigarettes    Quit date: 12/18/2012    Years since quitting: 9.7    Passive exposure: Never   Smokeless tobacco: Never  Vaping Use   Vaping Use: Never used  Substance and Sexual Activity   Alcohol use: No   Drug use: Yes    Frequency: 3.0 times per week  Sexual activity: Not on file  Other Topics Concern   Not on file  Social History Narrative   Divorced    2 daughters [name] comes to most appts and is Chief Executive Officer    -lives with daughter [name]    Used to work for [employer] now [employer]    Former smoker age 21 to age 77/2012 quit for sure in 2014 max 1/2 ppd FH lung cancer sister also smoker    Social Determinants of Health   Financial Resource Strain: Low Risk  (01/02/2018)   Overall Financial Resource Strain (CARDIA)    Difficulty of Paying Living Expenses: Not hard at all  Food Insecurity: No Food Insecurity (09/22/2021)   Hunger Vital Sign    Worried About Running Out of Food in the Last Year: Never true    [place] in the Last Year: Never true  Transportation Needs: No Transportation Needs (09/22/2021)   PRAPARE - Hydrologist (Medical): No    Lack of Transportation (Non-Medical): No  Physical Activity: Insufficiently Active (09/22/2021)   Exercise Vital Sign    Days of Exercise per Week: 1 day    Minutes of Exercise per Session: 60 min  Stress: No Stress Concern Present (09/22/2021)   [place]    Feeling of Stress : Not at all  Social Connections: Unknown (09/22/2021)   Social Connection and Isolation Panel [NHANES]    Frequency of Communication with Friends and Family: More than three times a week    Frequency of Social Gatherings with Friends and Family: More than three times a  week    Attends Religious Services: More than 4 times per year    Active Member of [employer] or Organizations: Not on file    Attends Archivist Meetings: More than 4 times per year    Marital Status: Not on file  Intimate Partner Violence: Not At Risk (09/22/2021)   Humiliation, Afraid, Rape, and Kick questionnaire    Fear of Current or Ex-Partner: No    Emotionally Abused: No    Physically Abused: No    Sexually Abused: No     REVIEW OF SYSTEMS:   [X]  denotes positive finding, [ ]  denotes negative finding Cardiac  Comments:  Chest pain or chest pressure:    Shortness of breath upon exertion:    Short of breath when lying flat:    Irregular heart rhythm:        Vascular    Pain in calf, thigh, or hip brought on by ambulation:    Pain in feet at night that wakes you up from your sleep:     Blood clot in your veins:    Leg swelling:         Pulmonary    Oxygen at home:    Productive cough:     Wheezing:         Neurologic    Sudden weakness in arms or legs:     Sudden numbness in arms or legs:     Sudden onset of difficulty speaking or slurred speech:    Temporary loss of vision in one eye:     Problems with dizziness:         Gastrointestinal    Blood in stool:     Vomited blood:         Genitourinary    Burning when urinating:     Blood in urine:  Psychiatric    Major depression:  x       Hematologic    Bleeding problems:    Problems with blood clotting too easily:        Skin    Rashes or ulcers:        Constitutional    Fever or chills:      PHYSICAL EXAMINATION:  Today's Vitals   09/12/22 1113  BP: (!) 172/95  Pulse: 88  Resp: 14  Temp: 98 F (36.7 C)  TempSrc: Temporal  PainSc: 9    There is no height or weight on file to calculate BMI.    General:  WDWN in NAD; vital signs documented above Gait: Not observed HENT: WNL, normocephalic Pulmonary: normal non-labored breathing , without wheezing Cardiac: regular HR, without  carotid bruits Abdomen: soft, NT; aortic pulse is not palpable Skin: without rashes Vascular Exam/Pulses:  Right Left  Radial 2+ (normal) 2+ (normal)  Femoral 2+ (normal) 2+ (normal)  DP Unable to palpate Unable to palpate  PT Unable to palpate Unable to palpate   Extremities: without ischemic changes, without Gangrene , without cellulitis; without open wounds Musculoskeletal: no muscle wasting or atrophy  Neurologic: A&O X 3 Psychiatric:  The pt has Normal affect.   Non-Invasive Vascular Imaging:   ABI's/TBI's on 09/12/2022: Right:  1.34/0.52 - Great toe pressure: 94 Left:  0.76/0.41 - Great toe pressure: 75  Arterial duplex on 09/12/2022: +-----------+--------+-----+--------+--------+--------+  RIGHT     PSV cm/sRatioStenosisWaveformComments  +-----------+--------+-----+--------+--------+--------+  CFA Prox   118                  biphasic          +-----------+--------+-----+--------+--------+--------+  DFA       126                  biphasic          +-----------+--------+-----+--------+--------+--------+  SFA Prox   76                   biphasic          +-----------+--------+-----+--------+--------+--------+  POP Prox   37                   biphasic          +-----------+--------+-----+--------+--------+--------+  ATA Distal 49                   biphasic          +-----------+--------+-----+--------+--------+--------+  PTA Distal 54                   biphasic          +-----------+--------+-----+--------+--------+--------+  PERO Distal54                   biphasic          +-----------+--------+-----+--------+--------+--------+     Right Stent(s):  +---------------+--------+--------+--------+--------+  SFA           PSV cm/sStenosisWaveformComments  +---------------+--------+--------+--------+--------+  Prox to Stent  107                                +---------------+--------+--------+--------+--------+  Proximal Stent 136                               +---------------+--------+--------+--------+--------+  Mid Stent  89                                +---------------+--------+--------+--------+--------+  Distal Stent   61                                +---------------+--------+--------+--------+--------+  Distal to Stent86                                +---------------+--------+--------+--------+--------+   Summary:  Right: Patent stent with no obvious evidence of stenosis   Previous ABI's/TBI's on 12/27/2021: Right:  0.95/0.65 - Great toe pressure: 110 Left:  0.64/0.49 - Great toe pressure:  83  Previous arterial duplex on 12/27/2021: Right: 50-74% stenosis noted in the superficial femoral artery.  Patent stent without evidence of stenosis    ASSESSMENT/PLAN:: 77 y.o. female here for follow up for PAD with hx of placement of bilateral superficial femoral artery stenting.  She moved to [place] and in March 2021 she underwent angioplasty of in-stent stenosis on the left.  She had subsequently gone to occlude the stents in her left leg.    Given her claudication, she was taken to the [facility] lab on 06/01/2021 and underwent aortogram with stenting to the right SFA.    -RLE duplex reveals that her SFA stent is patent.  Her ABI on the left is improved from last visit.  Pt's biggest complaint is pain in her knees bilaterally.  She does not endorse any claudication or rest pain or non healing wounds.  Discussed with them that I do feel her knee pain is vascular in nature.  Her daughter states she has been more active over the past couple of months and I encouraged her to continue as much walking as her knees will allow and to continue to stay active.  -continue asa.  Her daughter will touch base with her PCP and see why her statin was discontinued.  Per Dr. Thompson Caul note in November, he recommended discontinuing the  pravastatin and starting rosuvastatin 40mg  daily.  Recommend continuing this from vascular standpoint if no contraindications for this.  -as far as her Eliquis goes, she was on this prior to moving to [place].  Dr. Tamala Julian recommended continuing this and Plavix with decreased dose of Xarelto.  Will defer to them about continuing AC.    -pt will f/u in one year with RLE arterial duplex and ABI.  They know to call sooner if she has any issues with rest pain or non healing wounds.  Per Dr. Stephens Shire operative note, if she requires revascularization, she would need a femoral to below knee bypass graft but should not be done for claudication sx.  -I called and spoke with the daughter [name] about the statin and Eliquis and she will f/u with PCP and cardiology regarding these medications.    Leontine Locket, [facility] [phone]  Clinic MD:   Trula Slade

## 2022-09-13 ENCOUNTER — Other Ambulatory Visit: Payer: Self-pay | Admitting: Family Medicine

## 2022-09-13 DIAGNOSIS — E519 Thiamine deficiency, unspecified: Secondary | ICD-10-CM

## 2022-09-13 MED ORDER — THIAMINE HCL 100 MG PO TABS: 100.0000 mg | ORAL_TABLET | Freq: Three times a day (TID) | ORAL | 0 refills | Status: AC

## 2022-09-13 MED ORDER — THIAMINE HCL 100 MG PO TABS: 100.0000 mg | ORAL_TABLET | Freq: Every day | ORAL | 1 refills | Status: AC

## 2022-09-14 ENCOUNTER — Encounter: Payer: Self-pay | Admitting: Podiatry

## 2022-09-14 ENCOUNTER — Ambulatory Visit: Payer: Medicare PPO | Admitting: Podiatry

## 2022-09-14 VITALS — BP 169/94 | HR 102

## 2022-09-14 DIAGNOSIS — M79675 Pain in left toe(s): Secondary | ICD-10-CM

## 2022-09-14 DIAGNOSIS — M79674 Pain in right toe(s): Secondary | ICD-10-CM

## 2022-09-14 DIAGNOSIS — B351 Tinea unguium: Secondary | ICD-10-CM | POA: Diagnosis not present

## 2022-09-14 DIAGNOSIS — E0843 Diabetes mellitus due to underlying condition with diabetic autonomic (poly)neuropathy: Secondary | ICD-10-CM

## 2022-09-14 NOTE — Progress Notes (Signed)
This patient returns to my office for at risk foot care.  This patient requires this care by a professional since this patient will be at risk due to having DM, Coagulation defect and kidney disease.  This patient is unable to cut nails herself since the patient cannot reach her nails.These nails are painful walking and wearing shoes.  This patient presents for at risk foot care today.  General Appearance  Alert, conversant and in no acute stress.  Vascular  Dorsalis pedis and posterior tibial  pulses are  weakly palpable  bilaterally.  Capillary return is within normal limits  bilaterally. Temperature is within normal limits  bilaterally.  Neurologic  Senn-Weinstein monofilament wire test within normal limits  bilaterally. Muscle power within normal limits bilaterally.  Nails Thick disfigured discolored nails with subungual debris  from hallux to fifth toes bilaterally. No evidence of bacterial infection or drainage bilaterally.  Orthopedic  No limitations of motion  feet .  No crepitus or effusions noted.  No bony pathology or digital deformities noted.  Skin  normotropic skin with no porokeratosis noted bilaterally.  No signs of infections or ulcers noted.     Onychomycosis  Pain in right toes  Pain in left toes  Consent was obtained for treatment procedures.   Mechanical debridement of nails 1-5  bilaterally performed with a nail nipper.  Filed with dremel without incident.    Return office visit    4 months                  Told patient to return for periodic foot care and evaluation due to potential at risk complications.   Gardiner Barefoot DPM

## 2022-09-19 NOTE — Telephone Encounter (Signed)
Patient is scheduled to see provider this wednesday at 4 pm.

## 2022-09-20 ENCOUNTER — Ambulatory Visit (INDEPENDENT_AMBULATORY_CARE_PROVIDER_SITE_OTHER): Payer: Medicare PPO | Admitting: *Deleted

## 2022-09-20 ENCOUNTER — Encounter: Payer: Self-pay | Admitting: *Deleted

## 2022-09-20 VITALS — BP 169/94 | Ht 61.0 in | Wt 133.4 lb

## 2022-09-20 DIAGNOSIS — Z0000 Encounter for general adult medical examination without abnormal findings: Secondary | ICD-10-CM

## 2022-09-20 NOTE — Progress Notes (Addendum)
I connected with  [name] on 09/20/22 by a audio enabled telemedicine application and verified that I am speaking with the correct person using two identifiers.  Patient Location: Home  Provider Location: Office/Clinic  I discussed the limitations of evaluation and management by telemedicine. The patient expressed understanding and agreed to proceed.   Subjective:   Natalie Watts is a 77 y.o. female who presents for Medicare Annual (Subsequent) preventive examination.   Cardiac Risk Factors include: none     Objective:    Today's Vitals   09/20/22 1513  BP: (!) 169/94  Weight: 133 lb 6.4 oz (60.5 kg)  Height: 5\' 1"  (1.549 m)  PainSc: 9    Body mass index is 25.21 kg/m.     09/20/2022    3:32 PM 06/29/2022    2:19 PM 03/11/2022    1:35 AM 09/22/2021    2:57 PM 06/01/2021    8:18 PM 04/02/2018    8:20 AM 01/02/2018    9:28 AM  Advanced Directives  Does Patient Have a Medical Advance Directive? No No No Yes No No Yes  Type of Scientist, research (medical);Living will   [place]  Does patient want to make changes to medical advance directive?    No - Patient declined   No - Patient declined  Copy of [place] in Chart?    No - copy requested   No - copy requested  Would patient like information on creating a medical advance directive? Yes (ED - Information included in AVS) No - Patient declined   No - Patient declined      Current Medications (verified) Outpatient Encounter Medications as of 09/20/2022  Medication Sig   acetaminophen (TYLENOL) 500 MG tablet Take 1,000 mg by mouth 2 (two) times daily as needed for moderate pain or mild pain. Rapid release   amLODipine (NORVASC) 5 MG tablet Take 1 tablet (5 mg total) by mouth daily.   B-D UF III MINI PEN NEEDLES 31G X 5 MM MISC Inject 1 each into the skin 2 (two) times daily. Or preferred choice of the patient   Blood Glucose Monitoring Suppl (ONE TOUCH ULTRA 2)  w/Device KIT Used to check blood sugars daily.   Cholecalciferol (VITAMIN D3) 50 MCG (2000 UT) capsule Take 2,000 Units by mouth daily.   clobetasol cream (TEMOVATE) [id number] % Apply 1 Application topically 2 (two) times daily. Legs and hands prn   clopidogrel (PLAVIX) 75 MG tablet TAKE 1 TABLET BY MOUTH EVERY DAY   cyanocobalamin (VITAMIN B12) 1000 MCG tablet Take 1 tablet (1,000 mcg total) by mouth daily.   Dulaglutide (TRULICITY) 1.5 0000000 SOPN Inject 1.5 mg into the skin once a week. D/c 0.75   DULoxetine (CYMBALTA) 30 MG capsule Take 1 capsule (30 mg total) by mouth daily.   empagliflozin (JARDIANCE) 10 MG TABS tablet Take 1 tablet (10 mg total) by mouth daily before breakfast.   ezetimibe (ZETIA) 10 MG tablet Take 1 tablet (10 mg total) by mouth daily.   Lancets Ultra Fine MISC 1 Device by Does not apply route 2 (two) times daily as needed. Preferred for her device   LANTUS SOLOSTAR 100 UNIT/ML Solostar Pen Inject 40 Units into the skin daily.   leflunomide (ARAVA) 20 MG tablet Take 20 mg by mouth daily. rheumatology   losartan (COZAAR) 100 MG tablet Take 1 tablet (100 mg total) by mouth daily.   nebivolol (BYSTOLIC) 5  MG tablet Take 1 tablet (5 mg total) by mouth daily. Dc tenoretic   potassium chloride SA (KLOR-CON M) 20 MEQ tablet Take 1 tablet (20 mEq total) by mouth every other day.   thiamine (VITAMIN B1) 100 MG tablet Take 1 tablet (100 mg total) by mouth daily.   XARELTO 2.5 MG TABS tablet Take 1 tablet (2.5 mg total) by mouth 2 (two) times daily.   No facility-administered encounter medications on file as of 09/20/2022.    Allergies (verified) Lisinopril, Sulfamethoxazole-trimethoprim, Dapagliflozin, Gabapentin, Glipizide, Metformin, Metformin and related, Penicillin g, Penicillins, and Tramadol   History: Past Medical History:  Diagnosis Date   Abnormal CT of the chest 03/20/2022   Abnormal gait 10/13/2021   Abnormal MRI, lumbar spine 10/22/2021   Adjustment disorder with  depressed mood 07/23/2021   AKI (acute kidney injury) 05/25/2021   Allergic conjunctivitis 10/09/2017   ANA positive 05/24/2021   Arthritis    lumbar, cervical, cervical/lumbar spondylosis   Arthritis of left hip 02/22/2022   Arthritis of left knee 12/22/2021   Bilateral hip joint arthritis 02/22/2022   Bilateral hip pain 12/22/2021   Blood clotting disorder    Bronchiectasis 03/17/2022   CAD (coronary artery disease)    CAD (coronary artery disease) 05/17/2021   Candidiasis of skin 01/24/2017   Cardiac murmur    Carotid bruit    US carotid had 09/03/14 [facility]    Carotid bruit 06/01/2017   Carpal tunnel syndrome    Cataracts, bilateral    Cervical radiculopathy    improved since surgery 2016    Cervical radiculopathy 01/24/2017   Cervical spondylosis without myelopathy 06/05/2014   Cervicalgia 04/05/2018   Chicken pox    Chronic bursitis of left shoulder 05/27/2021   Chronic left shoulder pain 05/25/2021   Chronic pain of left knee 07/23/2021   CKD (chronic kidney disease) stage 3, GFR 30-59 ml/min    per Dr Vassie Moselle Su notes [place] noted 12/14/20   Colon polyp    12/18/14 tubulovillous adenoma high grade dysplasia Dr. Loann Quill [facility]    COVID 19+    mid 11/2020   COVID-19    01/17/22   Degenerative disk disease    Diabetes    Diabetes 04/16/2013   Overview:   Last A1c in Oct. 6.2.  Recheck at next visit.  No medications right now. Working on increasing activity.  Next step: add back metformin at a low dose if necessary.       Overview:   Last A1c in Oct. 6.2.  Recheck at next visit.  No medications right now. Working on increasing activity.  Next step: add back metformin at a low dose if necessary.       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      Overview:   Controlled on amlodipine.      Overview:   Overview:   Overview:   Controlled on amlodipine.    Hyperlipidemia    Hypertension    Impaired instrumental activities of daily living (IADL) 12/22/2021   Impaired mobility and ADLs 12/22/2021   Impairment of balance 01/24/2017   Incontinence of feces 12/08/2021   Insomnia    Insomnia 01/24/2017   Intertrigo    Left lumbar radiculitis 10/19/2012   Low back pain 04/05/2018   Lumbar radiculopathy    improved after steroid inj and PT 10/21/20 had right L4/5 L5-S1 transforaminal epidural steroid injections   LVH (left ventricular hypertrophy) 05/27/2021   Malignant tumor of colon 12/26/2014   Nontraumatic complete tear of right rotator cuff 04/16/2018   Obesity 01/24/2017   Onychomycosis    Onychomycosis 05/17/2021   OSA on CPAP    dx in 2020 after hip surgery CPAP 9-13 cm H20 supplies [employer] PSG 03/18/2019 severe OSA AHI 42.1/hr titration 03/27/2019 rec APAP 9-13 cm H20   Osteoarthritis of knee 01/16/2017   PAD (peripheral artery disease)    1 x right, 3 x left in 2020-2022 needs bypass on leg had CTA with run off 11/2020 in [place]   PAD (peripheral artery disease)    with stents   PAD (peripheral artery disease) 03/10/2016   B/l stents placed while living in [place] then moved to [place] and vascular MD Dr. Delbert Phenix in [place]  Note 07/27/20 ABI right 0.82 and left 0.35       CTA with runoff severe calcified plaque prox SFA multifocal moderate to severe areas of calcified plaque right popliteal artery with associated moderate to severe areas of stenosis moderate to severe focal areas of stenosis in the tibioperoneal t   Peripheral vascular disease    Primary osteoarthritis of first carpometacarpal joint of left hand 06/05/2014   Right foot drop    since 2020   Right foot drop 05/17/2021   Rotator cuff tendinitis, right 04/16/2018   Screening for colorectal cancer 11/30/2014   Shoulder pain, right 05/08/2018   Spinal stenosis of lumbar region 10/22/2021   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/2012   Spinal stenosis, lumbar 10/14/2021   Subdeltoid bursitis    Synovitis of ankle 12/15/2016   Thoracic arthritis 03/17/2022   Thoracic spinal stenosis 12/08/2021   Tinea pedis    Tubular adenoma of colon 05/25/2021   Vitamin D deficiency    Past Surgical History:  Procedure Laterality Date   ABDOMINAL AORTOGRAM W/LOWER EXTREMITY N/A 06/01/2021   Procedure: ABDOMINAL AORTOGRAM W/LOWER EXTREMITY;  Surgeon: Serafina Mitchell, MD;  Location: [facility];  Service: Cardiovascular;  Laterality: N/A;   BACK SURGERY     cervical laminoplasty C3-C7 Rex Dr. 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 Edwena Bunde [place] 12 mm d colon polyp, grade 1 IH, moderate diverticulosis tubular   ESOPHAGOGASTRODUODENOSCOPY  05/01/2020   erosive gastritis Dr. Tilford Pillar [place]   PERIPHERAL VASCULAR INTERVENTION  06/01/2021   Procedure: PERIPHERAL VASCULAR INTERVENTION;  Surgeon: Serafina Mitchell, MD;  Location: [facility];  Service: Cardiovascular;;  RT. SFA   right  total hip Right    03/05/2019 or 02/26/2020   TUBAL LIGATION     UPPER GI ENDOSCOPY     05/01/20   VASCULAR SURGERY     08/19/19   VASCULAR  SURGERY     07/05/19   VASCULAR SURGERY     05/21/2019 angioplasty since 05/2019, 06/2019 and 08/2019 had 2 left leg and 1 on right leg   Family History  Problem Relation Age of Onset   Hypertension Mother    Arthritis Mother    Cancer Father        colon cancer dx'ed died age 49   Hypertension Sister    Arthritis Sister    Cancer Sister        died in 40s brain tumor h/o lung cancer smoker died 2015/10/16    Early death Sister    Breast cancer Maternal Grandmother    Cancer Maternal Grandmother        breast dx older age    Hypertension Maternal Grandmother    Breast cancer Other        maternal neice   Cancer Other        died in 70s    Hypertension Daughter    Hypertension Daughter    Social History   Socioeconomic History   Marital status: Divorced    Spouse name: Not on file   Number of children: Not on file   Years of education: Not on file   Highest education level: Not on file  Occupational History   Not on file  Tobacco Use   Smoking status: Former    Packs/day: 1.00    Years: 45.00    Additional pack years: 0.00    Total pack years: 45.00    Types: Cigarettes    Quit date: 12/18/2012    Years since quitting: 9.7    Passive exposure: Never   Smokeless tobacco: Never  Vaping Use   Vaping Use: Never used  Substance and Sexual Activity   Alcohol use: No   Drug use: Yes    Frequency: 3.0 times per week   Sexual activity: Not on file  Other Topics Concern   Not on file  Social History Narrative   Divorced    2 daughters [name] comes to most appts and is Chief Executive Officer    -lives with daughter [name]    Used to work for [employer] now [employer]    Former smoker age 27 to age 77/2012 quit for sure in 2012-10-15 max 1/2 ppd FH lung cancer sister also smoker    Social Determinants of Health   Financial Resource Strain: Low Risk  (09/20/2022)   Overall Financial Resource Strain (CARDIA)    Difficulty of Paying Living Expenses: Not hard at all  Food Insecurity: No  Food Insecurity (09/20/2022)   Hunger Vital Sign    Worried About Running Out of Food in the Last Year: Never true    Ran Out of Food in the Last Year: Never true  Transportation Needs: No Transportation Needs (09/20/2022)   PRAPARE - Hydrologist (Medical): No    Lack of Transportation (Non-Medical): No  Physical Activity: Insufficiently Active (09/20/2022)   Exercise Vital Sign    Days of Exercise per Week: 3 days    Minutes of Exercise per Session: 40 min  Stress: No Stress Concern Present (09/20/2022)   [place]    Feeling of Stress : Not at  all  Social Connections: Moderately Integrated (09/20/2022)   Social Connection and Isolation Panel [NHANES]    Frequency of Communication with Friends and Family: More than three times a week    Frequency of Social Gatherings with Friends and Family: Twice a week    Attends Religious Services: More than 4 times per year    Active Member of [employer] or Organizations: Yes    Attends Archivist Meetings: Never    Marital Status: Divorced    Tobacco Counseling Counseling given: Not Answered   Clinical Intake:  Pre-visit preparation completed: Yes  Pain : 0-10 Pain Score: 9  Pain Type: Chronic pain Pain Location: Knee Pain Orientation: Left, Right Pain Descriptors / Indicators: Discomfort Pain Onset: More than a month ago Pain Frequency: Constant Pain Relieving Factors: Tylenol (Finished PT recently)  Pain Relieving Factors: Tylenol (Finished PT recently)  BMI - recorded: 25.22 Nutritional Status: BMI 25 -29 Overweight Diabetes: Yes CBG done?: No Did pt. bring in CBG monitor from home?: No  How often do you need to have someone help you when you read instructions, pamphlets, or other written materials from your doctor or pharmacy?: 1 - Never  Diabetic? Yes  Interpreter Needed?: No      Activities of Daily Living    09/20/2022     3:21 PM 09/22/2021    2:58 PM  In your present state of health, do you have any difficulty performing the following activities:  Hearing? 1 1  Comment wears hearing aids Hearing aids  Vision? 1 0  Difficulty concentrating or making decisions? 0 0  Walking or climbing stairs? 1 1  Comment use walker Cane in use when ambulating.  Dressing or bathing? 1 0  Comment requires assistance at times   Doing errands, shopping? 1 0  Preparing Food and eating ? Y Y  Comment  Family assist. Self feeds.  Using the Toilet? N N  In the past six months, have you accidently leaked urine? N N  Do you have problems with loss of bowel control? N N  Managing your Medications? N N  Managing your Finances? N N  Housekeeping or managing your Housekeeping? Tempie Donning  Comment  Maid assist as needed    Patient Care Team: Carollee Leitz, MD as PCP - General (Family Medicine) Belva Crome, MD (Inactive) as PCP - Cardiology (Cardiology)  Indicate any recent Medical Services you may have received from other than [facility] providers in the past year (date may be approximate).     Assessment:   This is a routine wellness examination for Arrissa.  Hearing/Vision screen No results found.  Dietary issues and exercise activities discussed: Current Exercise Habits: Structured exercise class, Type of exercise: walking, Time (Minutes): 45, Frequency (Times/Week): 3, Weekly Exercise (Minutes/Week): 135   Goals Addressed   None    Depression Screen    09/20/2022    3:20 PM 09/06/2022   10:15 AM 02/22/2022    9:40 AM 12/22/2021    9:38 AM 09/22/2021    2:54 PM 04/21/2021    2:24 PM 01/02/2018    8:35 AM  PHQ 2/9 Scores  PHQ - 2 Score 0 0 0 0 0 0 0    Fall Risk    09/20/2022    3:20 PM 09/06/2022   10:06 AM 02/22/2022    9:39 AM 12/22/2021    9:38 AM 09/22/2021    2:57 PM  Fall Risk   Falls in the past year? 1 0 1 1  1  Number falls in past yr: 1 0 1 1 0  Injury with Fall? 0 0 0 0 0  Risk for fall due to : History of  fall(s);Impaired balance/gait No Fall Risks Other (Comment) History of fall(s)   Follow up  Falls evaluation completed Falls evaluation completed Falls evaluation completed Falls evaluation completed    [place]:  Any stairs in or around the home? Yes  If so, are there any without handrails? No  Home free of loose throw rugs in walkways, pet beds, electrical cords, etc? Yes  Adequate lighting in your home to reduce risk of falls? Yes   ASSISTIVE DEVICES UTILIZED TO PREVENT FALLS:  Life alert? No  Use of a cane, walker or w/c? Yes  Grab bars in the bathroom? Yes  Shower chair or bench in shower? Yes  Elevated toilet seat or a handicapped toilet? Yes    Cognitive Function:    01/02/2018    9:37 AM  MMSE - Mini Mental State Exam  Orientation to time 5  Orientation to Place 5  Registration 3  Attention/ Calculation 5  Recall 3  Language- name 2 objects 2  Language- repeat 1  Language- follow 3 step command 3  Language- read & follow direction 1  Write a sentence 1  Copy design 1  Total score 30        09/20/2022    3:29 PM 09/22/2021    3:00 PM  6CIT Screen  What Year? 0 points 0 points  What month? 0 points 0 points  What time? 0 points 0 points  Count back from 20 0 points 0 points  Months in reverse 0 points 0 points  Repeat phrase 2 points 0 points  Total Score 2 points 0 points    Immunizations Immunization History  Administered Date(s) Administered   Fluad Quad(high Dose 65+) 05/31/2022   Hepatitis B, ADULT 06/15/2017, 08/17/2017, 12/20/2017   Influenza, High Dose Seasonal PF 04/07/2017, 04/05/2018, 04/09/2021   Influenza,inj,Quad PF,6+ Mos 04/09/2013   Influenza,inj,quad, With Preservative 03/20/2020   Influenza-Unspecified 04/09/2013   Moderna Sars-Covid-2 Vaccination 09/19/2019   PFIZER Comirnaty(Gray Top)Covid-19 Tri-Sucrose Vaccine 12/02/2020   PFIZER(Purple Top)SARS-COV-2 Vaccination 08/29/2019, 09/26/2019,  04/29/2020   Pneumococcal Conjugate-13 10/17/2017   Pneumococcal Polysaccharide-23 11/27/2008, 03/20/2020   Td 02/14/2011, 02/17/2018   Td,absorbed, Preservative Free, Adult Use, Lf Unspecified 02/14/2011    TDAP status: Up to date  Flu Vaccine status: Up to date  Pneumococcal vaccine status: Up to date  Covid-19 vaccine status: Information provided on how to obtain vaccines.   Qualifies for Shingles Vaccine? No   Zostavax completed No   Shingrix Completed?: No.    Education has been provided regarding the importance of this vaccine. Patient has been advised to call insurance company to determine out of pocket expense if they have not yet received this vaccine. Advised may also receive vaccine at local pharmacy or Health Dept. Verbalized acceptance and understanding.  Screening Tests Health Maintenance  Topic Date Due   Zoster Vaccines- Shingrix (1 of 2) Never done   OPHTHALMOLOGY EXAM  02/03/2022   MAMMOGRAM  07/20/2022   COVID-19 Vaccine (6 - 2023-24 season) 09/22/2022 (Originally 02/18/2022)   INFLUENZA VACCINE  01/19/2023   FOOT EXAM  02/26/2023   Lung Cancer Screening  03/08/2023   HEMOGLOBIN A1C  03/09/2023   Diabetic kidney evaluation - eGFR measurement  09/06/2023   Diabetic kidney evaluation - Urine ACR  09/06/2023   Medicare Annual  Wellness (AWV)  09/20/2023   DTaP/Tdap/Td (4 - Tdap) 02/18/2028   Pneumonia Vaccine 70+ Years old  Completed   DEXA SCAN  Completed   Hepatitis C Screening  Completed   HPV VACCINES  Aged Out   COLONOSCOPY (Pts 45-63yrs Insurance coverage will need to be confirmed)  Discontinued    Health Maintenance  Health Maintenance Due  Topic Date Due   Zoster Vaccines- Shingrix (1 of 2) Never done   OPHTHALMOLOGY EXAM  02/03/2022   MAMMOGRAM  07/20/2022    Colorectal cancer screening: No longer required.   Mammogram status: Ordered 09/06/2022. Pt provided with contact info and advised to call to schedule appt.   Bone Density status:  Completed 01/31/2022. Results reflect: Bone density results: OSTEOPENIA. Repeat every 2 years.  Lung Cancer Screening: (Low Dose CT Chest recommended if Age 80-80 years, 30 pack-year currently smoking OR have quit w/in 15years.) does not qualify.     Additional Screening:  Hepatitis C Screening: does qualify; Completed 09/03/2014  Vision Screening: Recommended annual ophthalmology exams for early detection of glaucoma and other disorders of the eye. Is the patient up to date with their annual eye exam?  Yes  Who is the provider or what is the name of the office in which the patient attends annual eye exams? [facility] If pt is not established with a provider, would they like to be referred to a provider to establish care? No .   Dental Screening: Recommended annual dental exams for proper oral hygiene  Community Resource Referral / Chronic Care Management: CRR required this visit?  No   CCM required this visit?  No      Plan:     I have personally reviewed and noted the following in the patient's chart:   Medical and social history Use of alcohol, tobacco or illicit drugs  Current medications and supplements including opioid prescriptions. Patient is not currently taking opioid prescriptions. Functional ability and status Nutritional status Physical activity Advanced directives List of other physicians Hospitalizations, surgeries, and ER visits in previous 12 months Vitals Screenings to include cognitive, depression, and falls Referrals and appointments  In addition, I have reviewed and discussed with patient certain preventive protocols, quality metrics, and best practice recommendations. A written personalized care plan for preventive services as well as general preventive health recommendations were provided to patient.     Cannon Kettle, [place]   09/20/2022   Nurse Notes: Total time spent on telephone visit today was 23 minutes.

## 2022-09-20 NOTE — Patient Instructions (Signed)
  Natalie Watts , Thank you for taking time to come for your Medicare Wellness Visit. I appreciate your ongoing commitment to your health goals. Please review the following plan we discussed and let me know if I can assist you in the future.   These are the goals we discussed:  Goals      Healthy Lifestyle     Stay active, exercise. Stay hydrated, drink plenty of fluids. Healthy diet; low carb. Monitor sugar intake.        This is a list of the screening recommended for you and due dates:  Health Maintenance  Topic Date Due   Zoster (Shingles) Vaccine (1 of 2) Never done   Eye exam for diabetics  02/03/2022   Mammogram  07/20/2022   COVID-19 Vaccine (6 - 2023-24 season) 09/22/2022*   Flu Shot  01/19/2023   Complete foot exam   02/26/2023   Screening for Lung Cancer  03/08/2023   Hemoglobin A1C  03/09/2023   Yearly kidney function blood test for diabetes  09/06/2023   Yearly kidney health urinalysis for diabetes  09/06/2023   Medicare Annual Wellness Visit  09/20/2023   DTaP/Tdap/Td vaccine (4 - Tdap) 02/18/2028   Pneumonia Vaccine  Completed   DEXA scan (bone density measurement)  Completed   Hepatitis C Screening: USPSTF Recommendation to screen - Ages 50-79 yo.  Completed   HPV Vaccine  Aged Out   Colon Cancer Screening  Discontinued  *Topic was postponed. The date shown is not the original due date.

## 2022-09-21 ENCOUNTER — Ambulatory Visit: Payer: Medicare PPO | Admitting: Family Medicine

## 2022-09-26 ENCOUNTER — Ambulatory Visit: Payer: Medicare PPO | Admitting: Family Medicine

## 2022-09-28 ENCOUNTER — Ambulatory Visit: Payer: Medicare PPO | Admitting: Family Medicine

## 2022-09-28 ENCOUNTER — Encounter: Payer: Self-pay | Admitting: Family Medicine

## 2022-09-28 VITALS — BP 138/70 | HR 94 | Temp 98.4°F | Ht 61.0 in | Wt 132.2 lb

## 2022-09-28 DIAGNOSIS — E1151 Type 2 diabetes mellitus with diabetic peripheral angiopathy without gangrene: Secondary | ICD-10-CM | POA: Diagnosis not present

## 2022-09-28 DIAGNOSIS — I152 Hypertension secondary to endocrine disorders: Secondary | ICD-10-CM | POA: Diagnosis not present

## 2022-09-28 DIAGNOSIS — I739 Peripheral vascular disease, unspecified: Secondary | ICD-10-CM | POA: Diagnosis not present

## 2022-09-28 DIAGNOSIS — E1159 Type 2 diabetes mellitus with other circulatory complications: Secondary | ICD-10-CM

## 2022-09-28 DIAGNOSIS — E119 Type 2 diabetes mellitus without complications: Secondary | ICD-10-CM | POA: Diagnosis not present

## 2022-09-28 DIAGNOSIS — E538 Deficiency of other specified B group vitamins: Secondary | ICD-10-CM | POA: Diagnosis not present

## 2022-09-28 DIAGNOSIS — R748 Abnormal levels of other serum enzymes: Secondary | ICD-10-CM

## 2022-09-28 DIAGNOSIS — E5111 Dry beriberi: Secondary | ICD-10-CM | POA: Diagnosis not present

## 2022-09-28 DIAGNOSIS — Z0100 Encounter for examination of eyes and vision without abnormal findings: Secondary | ICD-10-CM | POA: Diagnosis not present

## 2022-09-28 DIAGNOSIS — E559 Vitamin D deficiency, unspecified: Secondary | ICD-10-CM

## 2022-09-28 LAB — CK: Total CK: 948 U/L — ABNORMAL HIGH (ref 7–177)

## 2022-09-28 LAB — HM DIABETES EYE EXAM

## 2022-09-28 NOTE — Patient Instructions (Addendum)
It was a pleasure meeting you today. Thank you for allowing me to take part in your health care.  Our goals for today as we discussed include:  Will check your muscle enzymes today  Referral sent for pharmacy.  They will reach out to you to review medications and discuss blister packing    Recommend Shingles vaccine.  This is a 2 dose series and can be given at your local pharmacy.  Please talk to your pharmacist about this.   Referral sent for Mammogram. Please call to schedule appointment. [facility] [address] [place], [place] [address] [phone]   Follow up with Eye doctor for eye exam   If you have any questions or concerns, please do not hesitate to call the office at [phone].  I look forward to our next visit and until then take care and stay safe.  Regards,   Dana Allan, MD   [facility]

## 2022-09-28 NOTE — Progress Notes (Signed)
SUBJECTIVE:   Chief Complaint  Patient presents with   Medical Management of Chronic Issues    2 week follow up/brought medication/home health help at home/ physical therapy   HPI Patient presents to clinic for chronic disease management.  She brings with her her medications that she has currently at home.  When going through medications she is unsure what medications she is taking.  She endorses having missed some days of medications but not sure which ones.  She also endorses having stayed at different family members homes and may have left medications there.  Diabetes type 2 Prescribed Trulicity 1.5 mg weekly, Lantus 40 units daily and Jardiance 10 mg daily.  She reports compliancy with Trulicity however endorses missing Lantus 2-3 times weekly.  Reviewing her medication bottle of Jardiance, appears to be a sample bottle with 5 tablets remaining of 7.  She is unsure if she has picked up a prescription for this.  Recent A1c elevated from 7.7-8.9, this was discussed with her extensively.  PAD Patient previously on statin, Plavix and ASA.  Was previously seen by Dr. Katrinka Blazing, cardiology recommended discontinuing ASA and continuing Plavix, low-dose Xarelto and switching Pravachol to Crestor.  However patient was admitted to [facility] for mechanical fall 05/24/2022.  During that time CK was elevated and her pravastatin had been discontinued.  Since then her CK has remained elevated but has been trending downward.  Unfortunately she has not followed up with the appropriate times for monitoring labs and continues to remain off statin therapy.  She was recently seen by cardiovascular who reinstated ASA 81 mg.  Today review of her medication shows that her Plavix bottle is empty.  There is no Xarelto and she is unsure about this medication.  She is currently on Zetia 10 mg daily. We will check CK again today.    Lumbar radiculopathy Patient prescribed Cymbalta 30 mg daily.  This prescription bottle has not  been brought to the clinic.  She is not sure if she takes this medication and does not know when it was last refilled.  Hypertension Her prescribed medications that she has with her today are to ask 5 mg daily and potassium 20 mEq daily.  She denies any symptoms of headaches, visual changes, chest pain, shortness of breath or lower extremity edema.  Other prescribed medications that she is unsure of include losartan 100 mg daily and nebivolol 5 mg daily.   Rheumatoid arthritis Prescribed Areva 20 mg daily.  Follows with Dr. Maple Hudson.   PERTINENT PMH / PSH: Hypertension PAD RA CKD stage III   OBJECTIVE:  BP 138/70   Pulse 94   Temp 98.4 F (36.9 C) (Oral)   Ht  (1.549 m)   Wt 132 lb 3.2 oz (60 kg)   SpO2 96%   BMI 24.98 kg/m    Physical Exam Vitals reviewed.  Constitutional:      General: She is not in acute distress.    Appearance: She is not ill-appearing.  HENT:     Head: Normocephalic.     Nose: Nose normal.  Eyes:     Conjunctiva/sclera: Conjunctivae normal.  Cardiovascular:     Rate and Rhythm: Normal rate and regular rhythm.     Heart sounds: Normal heart sounds.  Pulmonary:     Effort: Pulmonary effort is normal.     Breath sounds: Normal breath sounds.  Abdominal:     General: Abdomen is flat. Bowel sounds are normal.     Palpations: Abdomen  is soft.  Musculoskeletal:        General: Normal range of motion.     Cervical back: Normal range of motion.  Neurological:     Mental Status: She is alert and oriented to person, place, and time. Mental status is at baseline.  Psychiatric:        Mood and Affect: Mood normal. Affect is flat.        Speech: Speech normal.        Behavior: Behavior normal. Behavior is cooperative.        Thought Content: Thought content normal. Thought content does not include suicidal ideation.        Cognition and Memory: Memory is impaired.        Judgment: Judgment normal.       09/28/2022    9:37 AM 01/02/2018    9:37  AM  MMSE - Mini Mental State Exam  Orientation to time 5 5  Orientation to Place 5 5  Registration 3 3  Attention/ Calculation 5 5  Recall 3 3  Language- name 2 objects 2 2  Language- repeat 1 1  Language- follow 3 step command 3 3  Language- read & follow direction 1 1  Write a sentence 1 1  Copy design 1 1  Total score 30 30     ASSESSMENT/PLAN:  DM (diabetes mellitus), type 2 with peripheral vascular complications Assessment & Plan: Chronic.  Stable.  Last A1c 7.7 at goal for age.  A1c 8.9. Referral has been sent to pharmacy to review medications and make appropriate adjustments. Recommend continuing Trulicity 1.5 mg weekly, consider increasing dose Given skipped doses of Lantus would recommend weaning this and increasing SGLT2 for now continue Lantus 40 units daily Continue Jardiance 10 mg daily.  If taking can increase to 25 Continue duloxetine 30 mg daily Patient may benefit from blister dose packing.      Orders: -     AMB Referral to Pharmacy Medication Management  Hypertension associated with diabetes Assessment & Plan: Chronic.  Stable.  Per JNC 8 guidelines at goal of less than 140/90 for age. Continue Norvasc 5 mg daily Patient does not have these medications with her today and is unsure if she is taking the Cozaar and Bystolic Will have pharmacy review and make appropriate adjustments. Recommend Continue Cozaar 100 mg daily Continue Bystolic 5 mg daily, Continue monitoring blood pressure at home Follow-up in 3 months Strict return precautions provided.  Orders: -     AMB Referral to Pharmacy Medication Management  Elevated CK Assessment & Plan: Chronic.  Stable.  Asymptomatic.  Noted during ED visit from 12/05.  CK levels 1869> 1790> 1279 Pravachol discontinued at that time and remain on hold Continue to hold Pravachol. When able can reinitiate statin therapy with Crestor.   Repeat CK level  Orders: -     CK  Peripheral vascular  disease Assessment & Plan: Chronic.  Stable.  Currently on DAPT.  ASA had been discontinued by Dr. Katrinka Blazing. [facility]  ASA reinitiated. Continue Plavix 75 mg daily, unsure if any missed doses.  Will defer to vascular for future refills Continue Xarelto 2.5 mg twice daily.  Patient unsure if she is taking medication.  Will defer to cardiology for future refills Statin was discontinued during hospitalization for elevated CK levels.  Have been monitored and now downward trending.  Pravachol not reinitiated as was recommended by Dr. Katrinka Blazing to switch to Crestor.  Given elevated CK have not  initiated statin therapy at this time. Check CK level Follow-up with vascular.  Can discuss if candidate for Repatha    Vitamin B12 deficiency Assessment & Plan: Vitamin B12 low Start vitamin B12 1000 mcg daily    Vitamin D deficiency Assessment & Plan: Vitamin D level low.  Had been off vitamin D supplements Restart vitamin D 2000 units daily   Thiamine deficiency neuropathy Assessment & Plan: Low thiamine levels Start vitamin B-1 100 mg 3 times daily x 3 days then decrease to 100 mg daily    PDMP reviewed  Return in about 3 months (around 12/28/2022).  Dana Allan, MD

## 2022-10-02 ENCOUNTER — Encounter: Payer: Self-pay | Admitting: Family Medicine

## 2022-10-02 DIAGNOSIS — E5111 Dry beriberi: Secondary | ICD-10-CM | POA: Insufficient documentation

## 2022-10-02 NOTE — Assessment & Plan Note (Signed)
Chronic.  Stable.  Asymptomatic.  Noted during ED visit from 12/05.  CK levels 1869> 1790> 1279 Pravachol discontinued at that time and remain on hold Continue to hold Pravachol. When able can reinitiate statin therapy with Crestor.   Repeat CK level

## 2022-10-02 NOTE — Assessment & Plan Note (Signed)
Chronic.  Stable.  Currently on DAPT.  ASA had been discontinued by Dr. Katrinka Blazing. [facility].  ASA reinitiated. Continue Plavix 75 mg daily, unsure if any missed doses.  Will defer to vascular for future refills Continue Xarelto 2.5 mg twice daily.  Patient unsure if she is taking medication.  Will defer to cardiology for future refills Statin was discontinued during hospitalization for elevated CK levels.  Have been monitored and now downward trending.  Pravachol not reinitiated as was recommended by Dr. Katrinka Blazing to switch to Crestor.  Given elevated CK have not initiated statin therapy at this time. Check CK level Follow-up with vascular.  Can discuss if candidate for Repatha

## 2022-10-02 NOTE — Assessment & Plan Note (Signed)
Vitamin D level low.  Had been off vitamin D supplements Restart vitamin D 2000 units daily

## 2022-10-02 NOTE — Assessment & Plan Note (Signed)
Vitamin B12 low Start vitamin B12 1000 mcg daily 

## 2022-10-02 NOTE — Assessment & Plan Note (Signed)
Chronic.  Stable.  Last A1c 7.7 at goal for age.  A1c 8.9. Referral has been sent to pharmacy to review medications and make appropriate adjustments. Recommend continuing Trulicity 1.5 mg weekly, consider increasing dose Given skipped doses of Lantus would recommend weaning this and increasing SGLT2 for now continue Lantus 40 units daily Continue Jardiance 10 mg daily.  If taking can increase to 25 Continue duloxetine 30 mg daily Patient may benefit from blister dose packing.

## 2022-10-02 NOTE — Assessment & Plan Note (Signed)
Chronic.  Stable.  Per JNC 8 guidelines at goal of less than 140/90 for age. Continue Norvasc 5 mg daily Patient does not have these medications with her today and is unsure if she is taking the Cozaar and Bystolic Will have pharmacy review and make appropriate adjustments. Recommend Continue Cozaar 100 mg daily Continue Bystolic 5 mg daily, Continue monitoring blood pressure at home Follow-up in 3 months Strict return precautions provided.

## 2022-10-02 NOTE — Assessment & Plan Note (Signed)
Low thiamine levels Start vitamin B-1 100 mg 3 times daily x 3 days then decrease to 100 mg daily

## 2022-10-04 ENCOUNTER — Other Ambulatory Visit: Payer: Self-pay

## 2022-10-04 ENCOUNTER — Other Ambulatory Visit (HOSPITAL_COMMUNITY): Payer: Self-pay

## 2022-10-04 ENCOUNTER — Telehealth: Payer: Self-pay | Admitting: *Deleted

## 2022-10-04 ENCOUNTER — Other Ambulatory Visit: Payer: Medicare PPO | Admitting: Pharmacist

## 2022-10-04 DIAGNOSIS — E785 Hyperlipidemia, unspecified: Secondary | ICD-10-CM

## 2022-10-04 DIAGNOSIS — E876 Hypokalemia: Secondary | ICD-10-CM

## 2022-10-04 DIAGNOSIS — G8929 Other chronic pain: Secondary | ICD-10-CM

## 2022-10-04 DIAGNOSIS — N183 Chronic kidney disease, stage 3 unspecified: Secondary | ICD-10-CM

## 2022-10-04 DIAGNOSIS — E1159 Type 2 diabetes mellitus with other circulatory complications: Secondary | ICD-10-CM

## 2022-10-04 DIAGNOSIS — G629 Polyneuropathy, unspecified: Secondary | ICD-10-CM

## 2022-10-04 DIAGNOSIS — E1165 Type 2 diabetes mellitus with hyperglycemia: Secondary | ICD-10-CM

## 2022-10-04 MED ORDER — LOSARTAN POTASSIUM 100 MG PO TABS
100.0000 mg | ORAL_TABLET | Freq: Every day | ORAL | 1 refills | Status: DC
Start: 2022-10-04 — End: 2023-04-01
  Filled 2022-10-04: qty 90, 90d supply, fill #0
  Filled 2022-12-29 (×2): qty 90, 90d supply, fill #1

## 2022-10-04 MED ORDER — XARELTO 2.5 MG PO TABS
2.5000 mg | ORAL_TABLET | Freq: Two times a day (BID) | ORAL | 1 refills | Status: DC
  Filled 2022-10-04: qty 90, 45d supply, fill #0

## 2022-10-04 MED ORDER — LANTUS SOLOSTAR 100 UNIT/ML ~~LOC~~ SOPN
30.0000 [IU] | PEN_INJECTOR | Freq: Every day | SUBCUTANEOUS | 11 refills | Status: AC
  Filled 2022-10-04: qty 9, 30d supply, fill #0
  Filled 2022-12-29 (×2): qty 9, 30d supply, fill #1
  Filled 2023-02-28: qty 9, 30d supply, fill #2
  Filled 2023-04-01: qty 9, 30d supply, fill #3
  Filled 2023-05-31 (×2): qty 9, 30d supply, fill #4
  Filled 2023-07-05 – 2023-07-06 (×2): qty 9, 30d supply, fill #5

## 2022-10-04 MED ORDER — DULOXETINE HCL 30 MG PO CPEP
30.0000 mg | ORAL_CAPSULE | Freq: Every day | ORAL | 1 refills | Status: DC
Start: 2022-10-04 — End: 2023-04-01
  Filled 2022-10-04: qty 90, 90d supply, fill #0
  Filled 2022-12-29 (×2): qty 90, 90d supply, fill #1

## 2022-10-04 MED ORDER — AMLODIPINE BESYLATE 5 MG PO TABS
5.0000 mg | ORAL_TABLET | Freq: Every day | ORAL | 1 refills | Status: DC
  Filled 2022-10-04: qty 90, 90d supply, fill #0
  Filled 2022-12-29 (×2): qty 90, 90d supply, fill #1

## 2022-10-04 MED ORDER — NEBIVOLOL HCL 5 MG PO TABS
5.0000 mg | ORAL_TABLET | Freq: Every day | ORAL | 1 refills | Status: DC
Start: 2022-10-04 — End: 2023-04-01
  Filled 2022-10-04: qty 90, 90d supply, fill #0
  Filled 2022-12-29 (×2): qty 90, 90d supply, fill #1

## 2022-10-04 MED ORDER — EMPAGLIFLOZIN 10 MG PO TABS
10.0000 mg | ORAL_TABLET | Freq: Every day | ORAL | 1 refills | Status: DC
Start: 2022-10-04 — End: 2023-04-26
  Filled 2022-10-04: qty 90, 90d supply, fill #0
  Filled 2022-12-29: qty 30, 30d supply, fill #1
  Filled 2022-12-29 (×2): qty 90, 90d supply, fill #1
  Filled 2023-02-07: qty 30, 30d supply, fill #2
  Filled 2023-04-03: qty 30, 30d supply, fill #3

## 2022-10-04 MED ORDER — EZETIMIBE 10 MG PO TABS
10.0000 mg | ORAL_TABLET | Freq: Every day | ORAL | 1 refills | Status: DC
Start: 2022-10-04 — End: 2023-04-01
  Filled 2022-10-04: qty 90, 90d supply, fill #0
  Filled 2022-12-29 (×2): qty 90, 90d supply, fill #1

## 2022-10-04 MED ORDER — TRULICITY 3 MG/0.5ML ~~LOC~~ SOAJ
3.0000 mg | SUBCUTANEOUS | 2 refills | Status: DC
  Filled 2022-10-04: qty 2, 28d supply, fill #0
  Filled 2022-12-29 (×2): qty 2, 28d supply, fill #1
  Filled 2023-02-07: qty 2, 28d supply, fill #2

## 2022-10-04 MED ORDER — CLOPIDOGREL BISULFATE 75 MG PO TABS
75.0000 mg | ORAL_TABLET | Freq: Every day | ORAL | 1 refills | Status: DC
  Filled 2022-10-04: qty 90, 90d supply, fill #0
  Filled 2022-12-29 (×2): qty 90, 90d supply, fill #1

## 2022-10-04 NOTE — Progress Notes (Unsigned)
10/04/2022 Name: Natalie Watts MRN: [id number] DOB: May 05, 1946  Chief Complaint  Patient presents with   Medication Management   Hyperlipidemia    Natalie Watts is a 77 y.o. year old female who presented for a telephone visit.   They were referred to the pharmacist by their PCP for assistance in managing hyperlipidemia and complex medication management.    Subjective:  Care Team: Primary Care Provider: Dana Allan, MD ; Next Scheduled Visit: 12/28/22 Cardiologist: Eldridge Dace; Next Scheduled Visit: 11/01/22  Medication Access/Adherence  Current Pharmacy:  [id number], [place] - [address] [place] [address] Phone: [phone] Fax: [phone]   Patient reports affordability concerns with their medications: No  Patient reports access/transportation concerns to their pharmacy: No  Patient reports adherence concerns with their medications:  Yes    Reviewed two different bags of medications today. Unclear exactly how she is taking. She is interested in pursuing adherence packaging to aid in medication management.   Diabetes:  Current medications: Trulicity 1.5 mg weekly, Basaglar 38 units daily, Jardiance 10 mg daily Prior medications: metformin - appears XR was tried and it did cause diarrea  Current glucose readings: has not been checking in the past ~ 2-3 weeks as she has been without insulin  Denies GI upset with Trulicity at this dose  Hypertension:  Current medications: losartan 100 mg daily, nebivolol 5 mg daily  Hyperlipidemia/ASCVD Risk Reduction, PAD:  Current lipid lowering medications: ezetimibe 10 mg daily - elevation in CK; was found in the setting of a mechanical fall, but patient was also on pravastatin therapy; plan to start rosuvastatin when CK normalizes. Denies any muscle aches or pains, notes that her joint pain in her hands/knees is her only regular discomfort.   Antiplatelet regimen: clopidogrel 75 mg  daily  Rheumatoid Arthritis: Current medications: leflunomide 20 mg daily; [facility]   Objective:  Lab Results  Component Value Date   HGBA1C 8.9 (H) 09/06/2022    Lab Results  Component Value Date   CREATININE 0.98 09/06/2022   BUN 16 09/06/2022   NA 141 09/06/2022   K 4.0 09/06/2022   CL 102 09/06/2022   CO2 28 09/06/2022    Lab Results  Component Value Date   CHOL 171 02/22/2022   HDL 58.30 02/22/2022   LDLCALC 90 02/22/2022   TRIG 114.0 02/22/2022   CHOLHDL 3 02/22/2022    Medications Reviewed Today     Reviewed by Dana Allan, MD (Physician) on 10/02/22 at 1738  Med List Status: <None>   Medication Order Taking? Sig Documenting Provider Last Dose Status Informant  acetaminophen (TYLENOL) 500 MG tablet [id number] Yes Take 1,000 mg by mouth 2 (two) times daily as needed for moderate pain or mild pain. Rapid release [provider] Taking Active Self  amLODipine (NORVASC) 5 MG tablet [id number] Yes Take 1 tablet (5 mg total) by mouth daily. McLean-Scocuzza, Pasty Spillers, MD Taking Active   azelastine (ASTELIN) 0.1 % nasal spray [id number] Yes Place into both nostrils 2 (two) times daily. Use in each nostril as directed [provider] Taking Active Self  B-D UF III MINI PEN NEEDLES 31G X 5 MM MISC [id number] Yes Inject 1 each into the skin 2 (two) times daily. Or preferred choice of the patient McLean-Scocuzza, Pasty Spillers, MD Taking Active   Blood Glucose Monitoring Suppl (ONE TOUCH ULTRA 2) w/Device KIT [id number] Yes Used to check blood sugars daily. McLean-Scocuzza, Pasty Spillers, MD Taking Active Self  Cholecalciferol (  VITAMIN D3) 50 MCG (2000 UT) capsule [id number] Yes Take 2,000 Units by mouth daily. [provider] Taking Active Self  clobetasol cream (TEMOVATE) 0.05 % [id number] Yes Apply 1 Application topically 2 (two) times daily. Legs and hands prn McLean-Scocuzza, Pasty Spillers, MD Taking Active   clopidogrel (PLAVIX) 75 MG tablet [id number] Yes  TAKE 1 TABLET BY MOUTH EVERY DAY McLean-Scocuzza, Pasty Spillers, MD Taking Active   cyanocobalamin (VITAMIN B12) 1000 MCG tablet [id number] Yes Take 1 tablet (1,000 mcg total) by mouth daily. Dana Allan, MD Taking Active   Dulaglutide (TRULICITY) 1.5 MG/0.5ML Namon Cirri [id number] Yes Inject 1.5 mg into the skin once a week. D/c 0.75 McLean-Scocuzza, Pasty Spillers, MD Taking Active   DULoxetine (CYMBALTA) 30 MG capsule [id number] Yes Take 1 capsule (30 mg total) by mouth daily. McLean-Scocuzza, Pasty Spillers, MD Taking Active   empagliflozin (JARDIANCE) 10 MG TABS tablet [id number] Yes Take 1 tablet (10 mg total) by mouth daily before breakfast. Dana Allan, MD Taking Active   ezetimibe (ZETIA) 10 MG tablet [id number] Yes Take 1 tablet (10 mg total) by mouth daily. McLean-Scocuzza, Pasty Spillers, MD Taking Active   Lancets Ultra Fine MISC [id number] Yes 1 Device by Does not apply route 2 (two) times daily as needed. Preferred for her device McLean-Scocuzza, Pasty Spillers, MD Taking Active   LANTUS SOLOSTAR 100 UNIT/ML Solostar Pen [id number] Yes Inject 40 Units into the skin daily. McLean-Scocuzza, Pasty Spillers, MD Taking Active   leflunomide (ARAVA) 20 MG tablet [id number] Yes Take 20 mg by mouth daily. rheumatology [provider] Taking Active   losartan (COZAAR) 100 MG tablet [id number] Yes Take 1 tablet (100 mg total) by mouth daily. McLean-Scocuzza, Pasty Spillers, MD Taking Active   nebivolol (BYSTOLIC) 5 MG tablet [id number] Yes Take 1 tablet (5 mg total) by mouth daily. Dc tenoretic McLean-Scocuzza, Pasty Spillers, MD Taking Active   potassium chloride SA (KLOR-CON M) 20 MEQ tablet [id number] Yes Take 1 tablet (20 mEq total) by mouth every other day. McLean-Scocuzza, Pasty Spillers, MD Taking Active   thiamine (VITAMIN B1) 100 MG tablet [id number] Yes Take 1 tablet (100 mg total) by mouth daily. Dana Allan, MD Taking Active   XARELTO 2.5 MG TABS tablet [id number] Yes Take 1 tablet (2.5 mg total) by mouth 2 (two) times daily. Lyn Records, MD Taking  Active               Assessment/Plan:  Diabetes: - Currently uncontrolled - Reviewed long term cardiovascular and renal outcomes of uncontrolled blood sugar - Reviewed goal A1c, goal fasting, and goal 2 hour post prandial glucose - Recommend to increase Trulicity to 3 mg weekly, reduce Lantus to 30 units daily to reduce risk of hypoglycemia. Continue Jardiance. Unclear if patient has been taking; refills sent to Pharmacy at [facility] for packaging and mail order - Discussed copays. Appears [facility] are [facility] at [facility] Upon verbal review, patient's income may be just above income cut off for Medicare Extra Help, but then should qualify for Trulicity (or Ozempic), basal insulin, and Jardiance patient assistance. Will need to complete Medicare Extra Help application for proof of denial. Patient is going to check her official income and call me back - Recommend to check glucose twice daily, fasting and 2 hour post prandial  Hypertension: - Currently controlled - Recommend to continue current regimen. Will discuss home checks moving forward.    Hyperlipidemia/ASCVD Risk Reduction: - Currently uncontrolled.  - Recommend to continue current regimen.  Continue to monitor CK downtrend. When normalize, recommend starting rosuvastatin, follow up for symptoms. Refills on ezetimibe, clopidogrel sent to pharmacy.  - Reviewed Vascular note and discussed Xarelto copay with patient. Since she has not been consistently taking Xarelto, she would like to discuss with new cardiologist Eldridge Dace) next month. Will discuss clopidogrel monotherapy vs clopidogrel + aspirin with Vascular in the interim.    Rheumatoid Arthritis - Moderately well controlled per patient report [facility]; patient has monitoring labs next week. They will send a new script to [facility] at that time  Follow Up Plan: will call patient next week if I have not heard back from her on  income.  Catie Eppie Gibson, PharmD, BCACP, CPP [facility] [phone]

## 2022-10-04 NOTE — Telephone Encounter (Signed)
Left message to return call (see below)  From Dr. Clent Ridges:  Please call to notify patient. Important for her to follow up with the pharmacist.   CK remains high but improving. Plan for review of medications with Pharmacy and will follow up with restarting statin therapy once clarification of medications completed.  Could consider Repatha injection and will have pharmacy check if patient is candidate

## 2022-10-05 ENCOUNTER — Other Ambulatory Visit: Payer: Self-pay | Admitting: Family Medicine

## 2022-10-05 ENCOUNTER — Encounter (HOSPITAL_COMMUNITY): Payer: Self-pay

## 2022-10-05 ENCOUNTER — Other Ambulatory Visit (HOSPITAL_COMMUNITY): Payer: Self-pay

## 2022-10-05 ENCOUNTER — Other Ambulatory Visit: Payer: Self-pay

## 2022-10-05 MED ORDER — LEFLUNOMIDE 20 MG PO TABS
20.0000 mg | ORAL_TABLET | Freq: Every day | ORAL | 0 refills | Status: DC
  Filled 2022-10-05: qty 90, 90d supply, fill #0

## 2022-10-05 NOTE — Patient Instructions (Addendum)
Opha,   It was great talking to you today!  [facility] will send pill packages with the following:  - Amlodipine 5 mg (blood pressure) - Clopidogrel 75 mg (prevention of cardiovascular disease) - Duloxetine 30 mg (mood and pain) - Jardiance 10 mg (diabetes and kidneys) - Ezetimibe 10 mg (cholesterol) - Losartan 100 mg daily (blood pressure) - Nebivolol 5 mg (blood pressure)  We cannot package the leflunomide, so continue to take that in the separate pill bottle. Remind your Rheumatologist to send refills to [facility] when you next see them.   We will hold off on Xarelto at this time and talk to your new cardiologist when you see him.   Your pravastatin had been stopped because of the CK elevation found at your hospital labs in December. Dr. Clent Ridges and I will collaborate with cardiology about deciding when to start a new statin, rosuvastatin.   We are increasing Trulicity to 3 mg weekly. Reduce Lantus to 30 units daily.   Please reach out to me with any questions about your medications! When you get the above packs, just use those. Stop using old bottles.   Thanks!  Catie Eppie Gibson, PharmD, BCACP, CPP [facility] [phone]

## 2022-10-06 ENCOUNTER — Other Ambulatory Visit: Payer: Self-pay

## 2022-10-06 ENCOUNTER — Ambulatory Visit: Payer: Medicare PPO | Admitting: Podiatry

## 2022-10-06 NOTE — Telephone Encounter (Signed)
F/u   Rtn call back to CMA

## 2022-10-06 NOTE — Telephone Encounter (Signed)
Pt aware of Dr. Claris Che message.

## 2022-10-12 ENCOUNTER — Other Ambulatory Visit: Payer: Medicare PPO | Admitting: Pharmacist

## 2022-10-12 DIAGNOSIS — M0579 Rheumatoid arthritis with rheumatoid factor of multiple sites without organ or systems involvement: Secondary | ICD-10-CM | POA: Diagnosis not present

## 2022-10-12 DIAGNOSIS — M79642 Pain in left hand: Secondary | ICD-10-CM | POA: Diagnosis not present

## 2022-10-12 DIAGNOSIS — M79641 Pain in right hand: Secondary | ICD-10-CM | POA: Diagnosis not present

## 2022-10-12 DIAGNOSIS — M25562 Pain in left knee: Secondary | ICD-10-CM | POA: Diagnosis not present

## 2022-10-12 DIAGNOSIS — R5382 Chronic fatigue, unspecified: Secondary | ICD-10-CM | POA: Diagnosis not present

## 2022-10-12 DIAGNOSIS — Z6824 Body mass index (BMI) 24.0-24.9, adult: Secondary | ICD-10-CM | POA: Diagnosis not present

## 2022-10-12 DIAGNOSIS — G8929 Other chronic pain: Secondary | ICD-10-CM | POA: Diagnosis not present

## 2022-10-12 DIAGNOSIS — M1991 Primary osteoarthritis, unspecified site: Secondary | ICD-10-CM | POA: Diagnosis not present

## 2022-10-27 ENCOUNTER — Emergency Department (HOSPITAL_COMMUNITY): Payer: Medicare PPO

## 2022-10-27 ENCOUNTER — Emergency Department (HOSPITAL_COMMUNITY)
Admission: EM | Admit: 2022-10-27 | Discharge: 2022-10-27 | Disposition: A | Discharge status: Home / Self Care | Payer: Medicare PPO | Attending: Emergency Medicine | Admitting: Emergency Medicine

## 2022-10-27 ENCOUNTER — Encounter (HOSPITAL_COMMUNITY): Payer: Self-pay | Admitting: Emergency Medicine

## 2022-10-27 ENCOUNTER — Other Ambulatory Visit: Payer: Self-pay

## 2022-10-27 DIAGNOSIS — M25551 Pain in right hip: Secondary | ICD-10-CM | POA: Diagnosis not present

## 2022-10-27 DIAGNOSIS — R739 Hyperglycemia, unspecified: Secondary | ICD-10-CM | POA: Diagnosis not present

## 2022-10-27 DIAGNOSIS — Z79899 Other long term (current) drug therapy: Secondary | ICD-10-CM | POA: Diagnosis not present

## 2022-10-27 DIAGNOSIS — R55 Syncope and collapse: Secondary | ICD-10-CM | POA: Insufficient documentation

## 2022-10-27 DIAGNOSIS — Z7901 Long term (current) use of anticoagulants: Secondary | ICD-10-CM | POA: Insufficient documentation

## 2022-10-27 DIAGNOSIS — W19XXXA Unspecified fall, initial encounter: Secondary | ICD-10-CM | POA: Diagnosis not present

## 2022-10-27 DIAGNOSIS — N1832 Chronic kidney disease, stage 3b: Secondary | ICD-10-CM | POA: Diagnosis not present

## 2022-10-27 DIAGNOSIS — I10 Essential (primary) hypertension: Secondary | ICD-10-CM | POA: Diagnosis not present

## 2022-10-27 DIAGNOSIS — Z7902 Long term (current) use of antithrombotics/antiplatelets: Secondary | ICD-10-CM | POA: Diagnosis not present

## 2022-10-27 DIAGNOSIS — I6381 Other cerebral infarction due to occlusion or stenosis of small artery: Secondary | ICD-10-CM | POA: Diagnosis not present

## 2022-10-27 LAB — URINALYSIS, ROUTINE W REFLEX MICROSCOPIC
Bilirubin Urine: NEGATIVE
Glucose, UA: 50 mg/dL — AB
Hgb urine dipstick: NEGATIVE
Ketones, ur: NEGATIVE mg/dL
Leukocytes,Ua: NEGATIVE
Nitrite: NEGATIVE
Protein, ur: 30 mg/dL — AB
Specific Gravity, Urine: 1.006 (ref 1.005–1.030)
pH: 6 (ref 5.0–8.0)

## 2022-10-27 LAB — CBC WITH DIFFERENTIAL/PLATELET
Abs Immature Granulocytes: 0.01 10*3/uL (ref 0.00–0.07)
Basophils Absolute: 0 10*3/uL (ref 0.0–0.1)
Basophils Relative: 0 %
Eosinophils Absolute: 0 10*3/uL (ref 0.0–0.5)
Eosinophils Relative: 1 %
HCT: 39.6 % (ref 36.0–46.0)
Hemoglobin: 12.7 g/dL (ref 12.0–15.0)
Immature Granulocytes: 0 %
Lymphocytes Relative: 40 %
Lymphs Abs: 2.2 10*3/uL (ref 0.7–4.0)
MCH: 29 pg (ref 26.0–34.0)
MCHC: 32.1 g/dL (ref 30.0–36.0)
MCV: 90.4 fL (ref 80.0–100.0)
Monocytes Absolute: 0.4 10*3/uL (ref 0.1–1.0)
Monocytes Relative: 7 %
Neutro Abs: 2.8 10*3/uL (ref 1.7–7.7)
Neutrophils Relative %: 52 %
Platelets: 161 10*3/uL (ref 150–400)
RBC: 4.38 MIL/uL (ref 3.87–5.11)
RDW: 13.7 % (ref 11.5–15.5)
WBC: 5.5 10*3/uL (ref 4.0–10.5)
nRBC: 0 % (ref 0.0–0.2)

## 2022-10-27 LAB — BASIC METABOLIC PANEL
Anion gap: 9 (ref 5–15)
BUN: 13 mg/dL (ref 8–23)
CO2: 28 mmol/L (ref 22–32)
Calcium: 9.5 mg/dL (ref 8.9–10.3)
Chloride: 100 mmol/L (ref 98–111)
Creatinine, Ser: 0.89 mg/dL (ref 0.44–1.00)
GFR, Estimated: >60 mL/min (ref >60)
Glucose, Bld: 254 mg/dL — ABNORMAL HIGH (ref 70–99)
Potassium: 3.8 mmol/L (ref 3.5–5.1)
Sodium: 137 mmol/L (ref 135–145)

## 2022-10-27 MED ORDER — ACETAMINOPHEN 325 MG PO TABS
650.0000 mg | ORAL_TABLET | Freq: Once | ORAL | Status: AC
  Administered 2022-10-27: 650 mg via ORAL
  Filled 2022-10-27: qty 2

## 2022-10-27 NOTE — ED Provider Notes (Signed)
[place] EMERGENCY DEPARTMENT AT [facility] Provider Note   CSN: [id number] Arrival date & time: 10/27/22  1344     History  Chief Complaint  Patient presents with   Natalie Watts Natalie Watts is a 77 y.o. female.  Patient presents to the emergency room complaining of right-sided hip pain secondary to a fall.  Patient believes it was mechanical but is somewhat of a poor historian.  She states she was walking back towards her bedroom when she felt that she was beginning to fall after reaching over to pick up an object.  She complains of right-sided hip pain.  She denies hitting her head.  She does state that she used to take Xarelto but stopped taking it approximately 1 month ago.  Past medical history significant for stage IIIb CKD, arthritis, GERD, lumbar radiculopathy, right THA  HPI     Home Medications Prior to Admission medications   Medication Sig Start Date End Date Taking? Authorizing Provider  acetaminophen (TYLENOL) 500 MG tablet Take 1,000 mg by mouth 2 (two) times daily as needed for moderate pain or mild pain. Rapid release Patient not taking: Reported on 10/04/2022    [provider]  amLODipine (NORVASC) 5 MG tablet Take 1 tablet (5 mg total) by mouth daily. 10/04/22   Dana Allan, MD  B-D UF III MINI PEN NEEDLES 31G X 5 MM MISC Inject 1 each into the skin 2 (two) times daily. Or preferred choice of the patient 07/23/21   McLean-Scocuzza, Pasty Spillers, MD  Blood Glucose Monitoring Suppl (ONE TOUCH ULTRA 2) w/Device KIT Used to check blood sugars daily. 05/08/18   McLean-Scocuzza, Pasty Spillers, MD  Cholecalciferol (VITAMIN D3) 50 MCG (2000 UT) capsule Take 2,000 Units by mouth daily.    [provider]  clobetasol cream (TEMOVATE) 0.05 % Apply 1 Application topically 2 (two) times daily. Legs and hands prn 02/22/22   McLean-Scocuzza, Pasty Spillers, MD  clopidogrel (PLAVIX) 75 MG tablet Take 1 tablet (75 mg total) by mouth daily. 10/04/22   Dana Allan, MD   cyanocobalamin (VITAMIN B12) 1000 MCG tablet Take 1 tablet (1,000 mcg total) by mouth daily. 09/09/22   Dana Allan, MD  Dulaglutide (TRULICITY) 3 MG/0.5ML SOPN Inject 3 mg as directed once a week. 10/04/22   Dana Allan, MD  DULoxetine (CYMBALTA) 30 MG capsule Take 1 capsule (30 mg total) by mouth daily. 10/04/22   Dana Allan, MD  empagliflozin (JARDIANCE) 10 MG TABS tablet Take 1 tablet (10 mg total) by mouth daily before breakfast. 10/04/22   Dana Allan, MD  ezetimibe (ZETIA) 10 MG tablet Take 1 tablet (10 mg total) by mouth daily. 10/04/22   Dana Allan, MD  Lancets Ultra Fine MISC 1 Device by Does not apply route 2 (two) times daily as needed. Preferred for her device 07/23/21   McLean-Scocuzza, Pasty Spillers, MD  LANTUS SOLOSTAR 100 UNIT/ML Solostar Pen Inject 30 Units into the skin daily. 10/04/22   Dana Allan, MD  leflunomide (ARAVA) 20 MG tablet Take 20 mg by mouth daily. rheumatology    [provider]  leflunomide (ARAVA) 20 MG tablet Take 1 tablet (20 mg total) by mouth daily. 06/15/22     losartan (COZAAR) 100 MG tablet Take 1 tablet (100 mg total) by mouth daily. 10/04/22   Dana Allan, MD  nebivolol (BYSTOLIC) 5 MG tablet Take 1 tablet (5 mg total) by mouth daily. 10/04/22   Dana Allan, MD  potassium chloride SA (KLOR-CON M)  20 MEQ tablet Take 1 tablet (20 mEq total) by mouth every other day. 02/22/22   McLean-Scocuzza, Pasty Spillers, MD  thiamine (VITAMIN B1) 100 MG tablet Take 1 tablet (100 mg total) by mouth daily. 09/17/22   Dana Allan, MD  XARELTO 2.5 MG TABS tablet Take 1 tablet (2.5 mg total) by mouth 2 (two) times daily. Patient not taking: Reported on 10/05/2022 10/04/22   Dana Allan, MD      Allergies    Lisinopril, Sulfamethoxazole-trimethoprim, Dapagliflozin, Gabapentin, Glipizide, Metformin, Metformin and related, Penicillin g, Penicillins, and Tramadol    Review of Systems   Review of Systems  Physical Exam Updated Vital Signs BP (!) 182/90 (BP Location: Right  Arm)   Pulse 80   Temp 98 F (36.7 C)   Resp 18   Ht 5\' 1"  (1.549 m)   Wt 62 kg   SpO2 95%   BMI 25.83 kg/m  Physical Exam Vitals and nursing note reviewed.  Constitutional:      General: She is not in acute distress.    Appearance: She is well-developed.  HENT:     Head: Normocephalic and atraumatic.  Eyes:     Conjunctiva/sclera: Conjunctivae normal.  Cardiovascular:     Rate and Rhythm: Normal rate and regular rhythm.     Heart sounds: No murmur heard. Pulmonary:     Effort: Pulmonary effort is normal. No respiratory distress.     Breath sounds: Normal breath sounds.  Abdominal:     Palpations: Abdomen is soft.     Tenderness: There is no abdominal tenderness.  Musculoskeletal:        General: No swelling, tenderness or deformity. Normal range of motion.     Cervical back: Neck supple.  Skin:    General: Skin is warm and dry.     Capillary Refill: Capillary refill takes less than 2 seconds.  Neurological:     Mental Status: She is alert.  Psychiatric:        Mood and Affect: Mood normal.     ED Results / Procedures / Treatments   Labs (all labs ordered are listed, but only abnormal results are displayed) Labs Reviewed  BASIC METABOLIC PANEL  CBC WITH DIFFERENTIAL/PLATELET  URINALYSIS, ROUTINE W REFLEX MICROSCOPIC    EKG None  Radiology No results found.  Procedures Procedures    Medications Ordered in ED Medications  acetaminophen (TYLENOL) tablet 650 mg (has no administration in time range)    ED Course/ Medical Decision Making/ A&P                             Medical Decision Making Amount and/or Complexity of Data Reviewed Labs: ordered. Radiology: ordered.  Risk OTC drugs.   This patient presents to the ED for concern of right hip pain, this involves an extensive number of treatment options, and is a complaint that carries with it a high risk of complications and morbidity.  The differential diagnosis includes periprosthetic  fracture, dislocation, soft tissue injury, and others.  Patient with an unwitnessed fall and unclear history.  Concerns for possible intracranial abnormality or laboratory abnormality   Co morbidities that complicate the patient evaluation  CKD stage IIIb   Additional history obtained:  Additional history obtained from family at bedside   Lab Tests:  I Ordered, and personally interpreted labs.  The pertinent results include: CBG 254, otherwise unremarkable CBC, BMP   Imaging Studies ordered:  I ordered imaging studies  including CT head without contrast, plain films of the right hip I independently visualized and interpreted imaging which showed Right hip arthroplasty without fracture or dislocation. 1. No acute intracranial abnormality.  2. Advanced chronic small vessel ischemia. 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   I agree with the radiologist interpretation   Problem List / ED Course / Critical interventions / Medication management   I ordered medication including Tylenol for pain Reevaluation of the patient after these medicines showed that the patient improved I have reviewed the patients home medicines and have made adjustments as needed   Test / Admission - Considered:  Patient with grossly unremarkable workup at time of shift handoff.  Patient care being assumed by Langston Masker, PA-C.  Disposition pending urinalysis.         Final Clinical Impression(s) / ED Diagnoses Final diagnoses:  None    Rx / DC Orders ED Discharge Orders     None         Pamala Duffel 10/27/22 1603    Virgina Norfolk, DO 10/28/22 [phone]

## 2022-10-27 NOTE — ED Provider Notes (Signed)
Patient's care assumed from Barrie Dunker, PA-C at 3:30 PM.  Patient had a fall today and struck her right hip.  Patient states she was walking without her walker and holding onto the wall.  Patient states she did not strike her head.  Patient states she did not lose consciousness.  Patient has been able to ambulate here in the emergency department.  Laboratory evaluations are obtained UA shows no acute infection glucose is elevated at 254 CBC is normal.  CT head shows chronic changes no acute.  X-ray right hip no fracture or dislocation. And is advised to use her walker when ambulating at home.  She is advised to follow-up with her primary care physician for recheck next week.  Patient is discharged in stable condition.   Elson Areas, [place] 10/27/22 Dorthey Sawyer, MD 10/28/22 [phone]

## 2022-10-27 NOTE — ED Notes (Signed)
Patient transported to X-ray 

## 2022-10-27 NOTE — Discharge Instructions (Signed)
Use your walker.  See your Physicain for recheck next week as scheduled

## 2022-10-27 NOTE — ED Triage Notes (Signed)
Pt BIB [facility] home due mechanical fall.  Pt fell from standing on carpet.  No LOC; no head trauma.  Pt stopped Xarelto 1 month ago.  Pt does endorse right hip pain. Aox4.

## 2022-10-28 ENCOUNTER — Telehealth: Payer: Self-pay

## 2022-10-28 NOTE — Transitions of Care (Post Inpatient/ED Visit) (Signed)
Unable to reach pt phone and left v/m requesting pt to call [phone].     10/28/2022  Name: Natalie Watts MRN: [id number] DOB: [birth year]  Today's TOC FU Call Status: Today's TOC FU Call Status:: Unsuccessul Call (1st Attempt) Unsuccessful Call (1st Attempt) Date: 10/28/22  Attempted to reach the patient regarding the most recent Inpatient/ED visit.  Follow Up Plan: Additional outreach attempts will be made to reach the patient to complete the Transitions of Care (Post Inpatient/ED visit) call.   Signature Lewanda Rife, LPN

## 2022-10-30 NOTE — Progress Notes (Unsigned)
Cardiology Office Note   Date:  11/01/2022   ID:  Natalie Watts, Natalie Watts [birth year], MRN [id number]  PCP:  Dana Allan, MD    No chief complaint on file.    Wt Readings from Last 3 Encounters:  11/01/22 122 lb (55.3 kg)  10/27/22 136 lb 11 oz (62 kg)  09/28/22 132 lb 3.2 oz (60 kg)       History of Present Illness: Natalie Watts is a 77 y.o. female former patient of Dr. Katrinka Blazing with a hx of diabetes mellitus 2, carotid bruit, right foot drop from ruptured disc, CKD stage III, former smoker, PAD, hyperlipidemia, primary hypertension, obstructive sleep apnea oc C-PAP, aortic atherosclerosis.    Bothered most by bilateral knee pain.  Walking limited. Does some activity at a senior center.  Some chair exercises.  Had a fall in 5/24.  No syncope.  Went to ER but was sent home.     Denies : Chest pain. Dizziness. Leg edema. Nitroglycerin use. Orthopnea. Palpitations. Paroxysmal nocturnal dyspnea. Shortness of breath. Syncope.    No issues with BP.  Rosuvastatin was stopped at some point.  She was unsure why but it was likely due to possible interaction with her leflunomide.   Past Medical History:  Diagnosis Date   Abnormal CT of the chest 03/20/2022   Abnormal gait 10/13/2021   Abnormal MRI, lumbar spine 10/22/2021   Adjustment disorder with depressed mood 07/23/2021   AKI (acute kidney injury) (HCC) 05/25/2021   Allergic conjunctivitis 10/09/2017   ANA positive 05/24/2021   Arthritis    lumbar, cervical, cervical/lumbar spondylosis   Arthritis of left hip 02/22/2022   Arthritis of left knee 12/22/2021   Bilateral hip joint arthritis 02/22/2022   Bilateral hip pain 12/22/2021   Blood clotting disorder (HCC)    Bronchiectasis (HCC) 03/17/2022   CAD (coronary artery disease)    CAD (coronary artery disease) 05/17/2021   Candidiasis of skin 01/24/2017   Cardiac murmur    Carotid bruit    US carotid had 09/03/14 [facility]    Carotid bruit 06/01/2017    Carpal tunnel syndrome    Cataracts, bilateral    Cervical radiculopathy    improved since surgery 2016    Cervical radiculopathy 01/24/2017   Cervical spondylosis without myelopathy 06/05/2014   Cervicalgia 04/05/2018   Chicken pox    Chronic bursitis of left shoulder 05/27/2021   Chronic left shoulder pain 05/25/2021   Chronic pain of left knee 07/23/2021   CKD (chronic kidney disease) stage 3, GFR 30-59 ml/min (HCC)    per Dr Ailene Ravel Su notes [place] noted 12/14/20   Colon polyp    12/18/14 tubulovillous adenoma high grade dysplasia Dr. Griffith Citron Duke    COVID 19+    mid 11/2020   COVID-19    01/17/22   Degenerative disk disease    Diabetes (HCC)    Diabetes (HCC) 04/16/2013   Overview:   Last A1c in Oct. 6.2.  Recheck at next visit.  No medications right now. Working on increasing activity.  Next step: add back metformin at a low dose if necessary.       Overview:   Last A1c in Oct. 6.2.  Recheck at next visit.  No medications right now. Working on increasing activity.  Next step: add back metformin at a low dose if necessary.       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      Overview:   Controlled  on amlodipine.      Overview:   Overview:   Overview:   Controlled on amlodipine.    Hyperlipidemia    Hypertension    Impaired instrumental activities of daily living (IADL) 12/22/2021   Impaired mobility and ADLs 12/22/2021   Impairment of balance 01/24/2017   Incontinence of feces 12/08/2021   Insomnia    Insomnia 01/24/2017   Intertrigo    Left lumbar radiculitis 10/19/2012   Low back pain 04/05/2018   Lumbar radiculopathy    improved after steroid inj and PT 10/21/20 had right L4/5 L5-S1 transforaminal epidural steroid injections   LVH (left ventricular hypertrophy) 05/27/2021   Malignant tumor of colon (HCC) 12/26/2014   Nontraumatic complete tear of right rotator cuff 04/16/2018   Obesity 01/24/2017   Onychomycosis    Onychomycosis 05/17/2021   OSA on CPAP    dx in 2020 after hip surgery CPAP 9-13 cm H20 supplies [employer] PSG 03/18/2019 severe OSA AHI 42.1/hr titration 03/27/2019 rec APAP 9-13 cm H20   Osteoarthritis of knee 01/16/2017   PAD (peripheral artery disease) (HCC)    1 x right, 3 x left in 2020-2022 needs bypass on leg had CTA with run off 11/2020 in [place]   PAD (peripheral artery disease) (HCC)    with stents   PAD (peripheral artery disease) (HCC) 03/10/2016   B/l stents placed while living in [place] then moved to [place] and vascular MD Dr. Maricela Bo in [place]  Note 07/27/20 ABI right 0.82 and left 0.35      CTA with runoff severe calcified plaque prox SFA multifocal moderate to severe areas of calcified plaque right popliteal artery with associated moderate to severe areas of stenosis moderate to severe focal areas of stenosis in the tibioperoneal t   Peripheral vascular disease (HCC)    Primary osteoarthritis of first carpometacarpal joint of left hand 06/05/2014   Right foot drop    since 2020   Right foot drop 05/17/2021   Rotator cuff tendinitis, right 04/16/2018   Screening for colorectal cancer 11/30/2014   Shoulder pain, right 05/08/2018   Spinal stenosis  of lumbar region 10/22/2021   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/2012   Spinal stenosis, lumbar 10/14/2021   Subdeltoid bursitis    Synovitis of ankle 12/15/2016   Thoracic arthritis 03/17/2022   Thoracic spinal stenosis 12/08/2021   Tinea pedis    Tubular adenoma of colon 05/25/2021   Vitamin D deficiency     Past Surgical History:  Procedure Laterality Date   ABDOMINAL AORTOGRAM W/LOWER EXTREMITY N/A 06/01/2021   Procedure: ABDOMINAL AORTOGRAM W/LOWER  EXTREMITY;  Surgeon: Nada Libman, MD;  Location: [facility] INVASIVE CV LAB;  Service: Cardiovascular;  Laterality: N/A;   BACK SURGERY     cervical laminoplasty C3-C7 Rex Dr. Liliane Bade fusion/decompression 2/2 myelopathy 03/2014 or 02/2015   COLON SURGERY     colectomy in 2015   COLONOSCOPY  05/01/2020   [place] [facility] [place] Dr. Noah Delaine Adeniji 12 mm d colon polyp, grade 1 IH, moderate diverticulosis tubular   ESOPHAGOGASTRODUODENOSCOPY  05/01/2020   erosive gastritis Dr. Brunetta Jeans [place]   PERIPHERAL VASCULAR INTERVENTION  06/01/2021   Procedure: PERIPHERAL VASCULAR INTERVENTION;  Surgeon: Nada Libman, MD;  Location: [facility] INVASIVE CV LAB;  Service: Cardiovascular;;  RT. SFA   right total hip Right    03/05/2019 or 02/26/2020   TUBAL LIGATION     UPPER GI ENDOSCOPY     05/01/20   VASCULAR SURGERY     08/19/19   VASCULAR SURGERY     07/05/19   VASCULAR SURGERY     05/21/2019 angioplasty since 05/2019, 06/2019 and 08/2019 had 2 left leg and 1 on right leg     Current Outpatient Medications  Medication Sig Dispense Refill   acetaminophen (TYLENOL) 500 MG tablet Take 1,000 mg by mouth 2 (two) times daily as needed for moderate pain or mild pain. Rapid release     amLODipine (NORVASC) 5 MG tablet Take 1 tablet (5 mg total) by mouth daily. 90 tablet 1   B-D UF III MINI PEN NEEDLES 31G X 5 MM MISC Inject 1 each into the skin 2 (two) times daily. Or preferred choice of the patient 300  each 3   Blood Glucose Monitoring Suppl (ONE TOUCH ULTRA 2) w/Device KIT Used to check blood sugars daily. 1 each 0   Cholecalciferol (VITAMIN D3) 50 MCG (2000 UT) capsule Take 2,000 Units by mouth daily.     clobetasol cream (TEMOVATE) 0.05 % Apply 1 Application topically 2 (two) times daily. Legs and hands prn 30 g 5   clopidogrel (PLAVIX) 75 MG tablet Take 1 tablet (75 mg total) by mouth daily. 90 tablet 1   cyanocobalamin (VITAMIN B12) 1000 MCG tablet Take 1 tablet (1,000 mcg total) by mouth daily. 90 tablet 1   Dulaglutide (TRULICITY) 3 MG/0.5ML SOPN Inject 3 mg as directed once a week. 2 mL 2   DULoxetine (CYMBALTA) 30 MG capsule Take 1 capsule (30 mg total) by mouth daily. 90 capsule 1   empagliflozin (JARDIANCE) 10 MG TABS tablet Take 1 tablet (10 mg total) by mouth daily before breakfast. 90 tablet 1   ezetimibe (ZETIA) 10 MG tablet Take 1 tablet (10 mg total) by mouth daily. 90 tablet 1   Lancets Ultra Fine MISC 1 Device by Does not apply route 2 (two) times daily as needed. Preferred for her device 180 each 3   LANTUS SOLOSTAR 100 UNIT/ML Solostar Pen Inject 30 Units into the skin daily. 15 mL 11   leflunomide (ARAVA) 20 MG tablet Take 20 mg by mouth daily. rheumatology     leflunomide (ARAVA) 20 MG tablet Take 1 tablet (20 mg total) by mouth daily. 90 tablet 0   losartan (COZAAR) 100 MG tablet Take 1 tablet (100 mg total) by mouth daily. 90 tablet 1   nebivolol (BYSTOLIC) 5 MG tablet Take 1 tablet (5 mg total) by mouth daily. 90 tablet 1   potassium chloride SA (KLOR-CON M) 20 MEQ tablet Take 1 tablet (20 mEq total) by mouth every other day.  45 tablet 3   thiamine (VITAMIN B1) 100 MG tablet Take 1 tablet (100 mg total) by mouth daily. 90 tablet 1   XARELTO 2.5 MG TABS tablet Take 1 tablet (2.5 mg total) by mouth 2 (two) times daily. 90 tablet 1   No current facility-administered medications for this visit.    Allergies:   Lisinopril, Sulfamethoxazole-trimethoprim, Dapagliflozin,  Gabapentin, Glipizide, Metformin, Metformin and related, Penicillin g, Penicillins, and Tramadol    Social History:  The patient  reports that she quit smoking about 9 years ago. Her smoking use included cigarettes. She has a 45.00 pack-year smoking history. She has never been exposed to tobacco smoke. She has never used smokeless tobacco. She reports current drug use. Frequency: 3.00 times per week. She reports that she does not drink alcohol.   Family History:  The patient's family history includes Arthritis in her mother and sister; Breast cancer in her maternal grandmother and another family member; Cancer in her father, maternal grandmother, sister, and another family member; Early death in her sister; Hypertension in her daughter, daughter, maternal grandmother, mother, and sister.    ROS:  Please see the history of present illness.   Otherwise, review of systems are positive for weakness, bilateral knee pain.   All other systems are reviewed and negative.    PHYSICAL EXAM: VS:  BP 118/82   Pulse 80   Ht 5\' 1"  (1.549 m)   Wt 122 lb (55.3 kg)   SpO2 99%   BMI 23.05 kg/m  , BMI Body mass index is 23.05 kg/m. GEN: Well nourished, well developed, in no acute distress, frail HEENT: normal Neck: no JVD, carotid bruits, or masses Cardiac: RRR; no murmurs, rubs, or gallops,no edema  Respiratory:  clear to auscultation bilaterally, normal work of breathing GI: soft, nontender, nondistended, + BS MS: no deformity or atrophy Skin: warm and dry, no rash Neuro:  Strength and sensation are intact Psych: euthymic mood, full affect   EKG:   The ekg ordered today demonstrates NSR, no ST changes   Recent Labs: 09/06/2022: ALT 13; TSH 1.80 10/27/2022: BUN 13; Creatinine, Ser 0.89; Hemoglobin 12.7; Platelets 161; Potassium 3.8; Sodium 137   Lipid Panel    Component Value Date/Time   CHOL 171 02/22/2022 1016   TRIG 114.0 02/22/2022 1016   HDL 58.30 02/22/2022 1016   CHOLHDL 3 02/22/2022  1016   VLDL 22.8 02/22/2022 1016   LDLCALC 90 02/22/2022 1016     Other studies Reviewed: Additional studies/ records that were reviewed today with results demonstrating: labs reviewed.   ASSESSMENT AND PLAN:  PAD/claudication/aortic atherosclerosis: Has been on low-dose Xarelto and clopidogrel for preventive therapy.  Prior right SFA stent. Known left SFA disease. CKD stage III: Avoid nephrotoxins.  Stay well-hydrated. Hyperlipidemia: Switched to rosuvastatin in 2023. No longer on the list.  She is taking Zetia.  Discussed resuming rosuvastatin 20 mg daily.  Whole food, plant-based diet.  High-fiber diet.  Avoid processed foods.  Check liver and lipids in 3 months.  We later decided against starting rosuvastatin due to interaction with leflunomide after discussion with pharmacy.  Continue Zetia Coronary calcification: No angina. No prior PCI.  Type 2 diabetes: A1C 8.9.   Avoiding falling is her biggest priority.  She is quite frail with impaired balance.   Current medicines are reviewed at length with the patient today.  The patient concerns regarding her medicines were addressed.  The following changes have been made: As above  Labs/ tests ordered today include:  As above No orders of the defined types were placed in this encounter.   Recommend 150 minutes/week of aerobic exercise Low fat, low carb, high fiber diet recommended  Disposition:   FU in 6 months with APP, 1 year with me   Signed, Lance Muss, MD  11/01/2022 11:07 AM    [facility] [address] [place], [place], [place]  [address] Phone: [phone]; Fax: [phone]

## 2022-10-31 NOTE — Transitions of Care (Post Inpatient/ED Visit) (Signed)
Unable to reach pt and left v/m requesting pt call [phone]. Pt already has FU ED appt scheduled with DR Eldridge Dace on 11/01/22.    10/31/2022  Name: Natalie Watts MRN: [id number] DOB: 1945/12/23  Today's TOC FU Call Status: Today's TOC FU Call Status:: Unsuccessful Call (2nd Attempt) Unsuccessful Call (1st Attempt) Date: 10/28/22 Unsuccessful Call (2nd Attempt) Date: 10/31/22  Attempted to reach the patient regarding the most recent Inpatient/ED visit.  Follow Up Plan: Additional outreach attempts will be made to reach the patient to complete the Transitions of Care (Post Inpatient/ED visit) call.   Signature Lewanda Rife, LPN

## 2022-11-01 ENCOUNTER — Ambulatory Visit: Payer: Medicare PPO | Attending: Interventional Cardiology | Admitting: Interventional Cardiology

## 2022-11-01 ENCOUNTER — Other Ambulatory Visit (HOSPITAL_COMMUNITY): Payer: Self-pay

## 2022-11-01 ENCOUNTER — Telehealth: Payer: Self-pay | Admitting: *Deleted

## 2022-11-01 ENCOUNTER — Encounter: Payer: Self-pay | Admitting: Interventional Cardiology

## 2022-11-01 ENCOUNTER — Other Ambulatory Visit: Payer: Self-pay

## 2022-11-01 VITALS — BP 118/82 | HR 80 | Ht 61.0 in | Wt 122.0 lb

## 2022-11-01 DIAGNOSIS — I10 Essential (primary) hypertension: Secondary | ICD-10-CM

## 2022-11-01 DIAGNOSIS — E782 Mixed hyperlipidemia: Secondary | ICD-10-CM

## 2022-11-01 DIAGNOSIS — I700 Atherosclerosis of aorta: Secondary | ICD-10-CM

## 2022-11-01 DIAGNOSIS — E785 Hyperlipidemia, unspecified: Secondary | ICD-10-CM | POA: Diagnosis not present

## 2022-11-01 DIAGNOSIS — I739 Peripheral vascular disease, unspecified: Secondary | ICD-10-CM | POA: Diagnosis not present

## 2022-11-01 MED ORDER — ROSUVASTATIN CALCIUM 20 MG PO TABS
20.0000 mg | ORAL_TABLET | Freq: Every day | ORAL | 3 refills | Status: DC
  Filled 2022-11-01: qty 60, 60d supply, fill #0
  Filled 2022-11-01: qty 90, 90d supply, fill #0

## 2022-11-01 NOTE — Telephone Encounter (Signed)
Call placed to patient's daughter to make her aware of change.  Left message to call office

## 2022-11-01 NOTE — Patient Instructions (Signed)
Medication Instructions:  Your physician has recommended you make the following change in your medication:  Start Rosuvastatin 20 mg by mouth daily   *If you need a refill on your cardiac medications before your next appointment, please call your pharmacy*   Lab Work: Your physician recommends that you return for lab work on February 01, 2023.  CMET and Lipid profiles.  This will be fasting.  The lab opens at 7:15 AM  If you have labs (blood work) drawn today and your tests are completely normal, you will receive your results only by: MyChart Message (if you have MyChart) OR A paper copy in the mail If you have any lab test that is abnormal or we need to change your treatment, we will call you to review the results.   Testing/Procedures: none   Follow-Up: At [facility], you and your health needs are our priority.  As part of our continuing mission to provide you with exceptional heart care, we have created designated Provider Care Teams.  These Care Teams include your primary Cardiologist (physician) and Advanced Practice Providers (APPs -  Physician Assistants and Nurse Practitioners) who all work together to provide you with the care you need, when you need it.  We recommend signing up for the patient portal called "MyChart".  Sign up information is provided on this After Visit Summary.  MyChart is used to connect with patients for Virtual Visits (Telemedicine).  Patients are able to view lab/test results, encounter notes, upcoming appointments, etc.  Non-urgent messages can be sent to your provider as well.   To learn more about what you can do with MyChart, go to [email or web address].    Your next appointment:   6 month(s)  Provider:   Jari Favre, PA-C, Ronie Spies, PA-C, Robin Searing, NP, Jacolyn Reedy, PA-C, Eligha Bridegroom, NP, or Tereso Newcomer, PA-C         Other Instructions

## 2022-11-01 NOTE — Telephone Encounter (Signed)
Received message from Dr Eldridge Dace that patient should not start Rosuvastatin due to increased risk of myopathy since patient is on leflunomide.  Will not need lab work in August.  Pharmacist has cancelled order for Rosuvastatin. Patient should continue Zetia. Lab work cancelled. Call placed to patient to make her aware.  Left message to call office

## 2022-11-03 NOTE — Telephone Encounter (Signed)
Patient notified.  I asked her to have her daughter call if she had any questions.

## 2022-12-09 IMAGING — DX DG KNEE COMPLETE 4+V*L*
5 series · 5 of 5 positions shown · non-contrast
Comparison: None.

CLINICAL DATA: Left knee pain.

EXAM:
LEFT KNEE - COMPLETE 4+ VIEW

[knee standing ap]
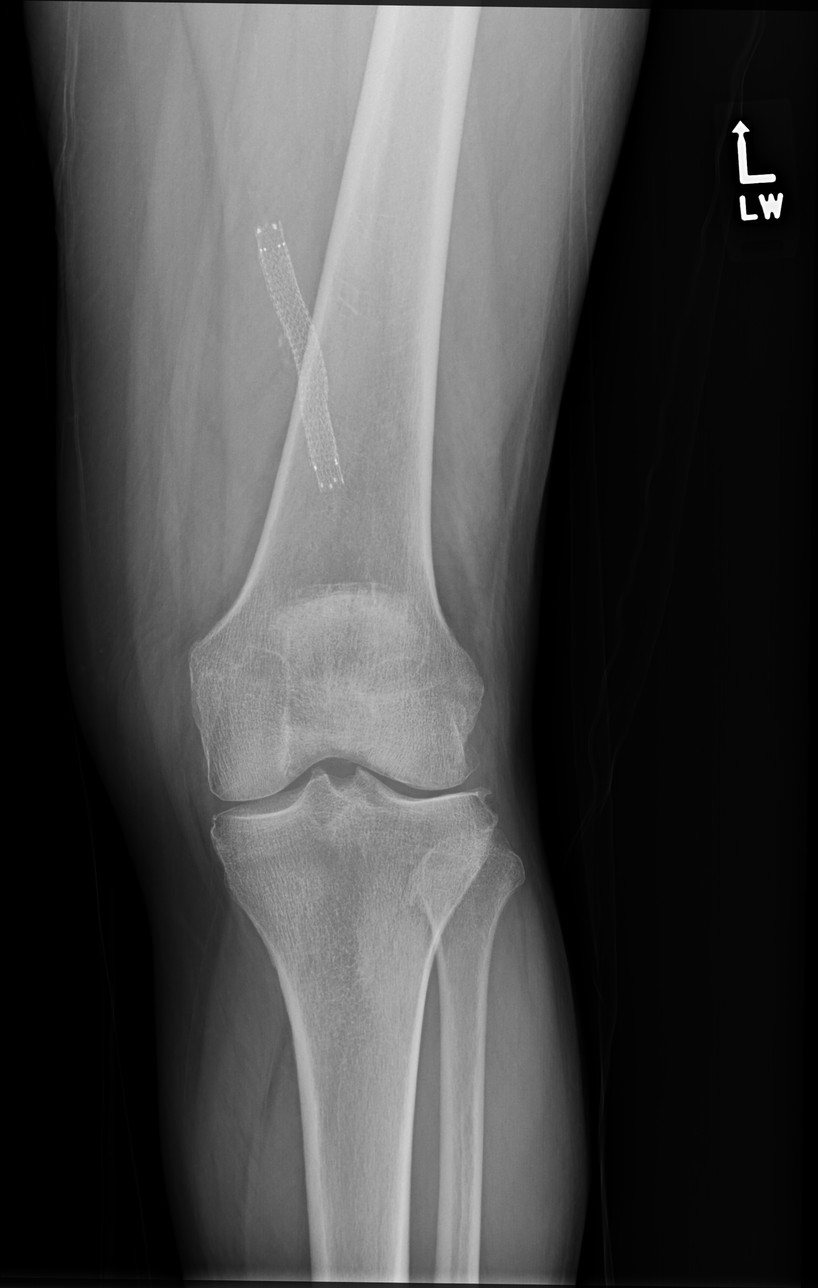

[knee standing external ap]
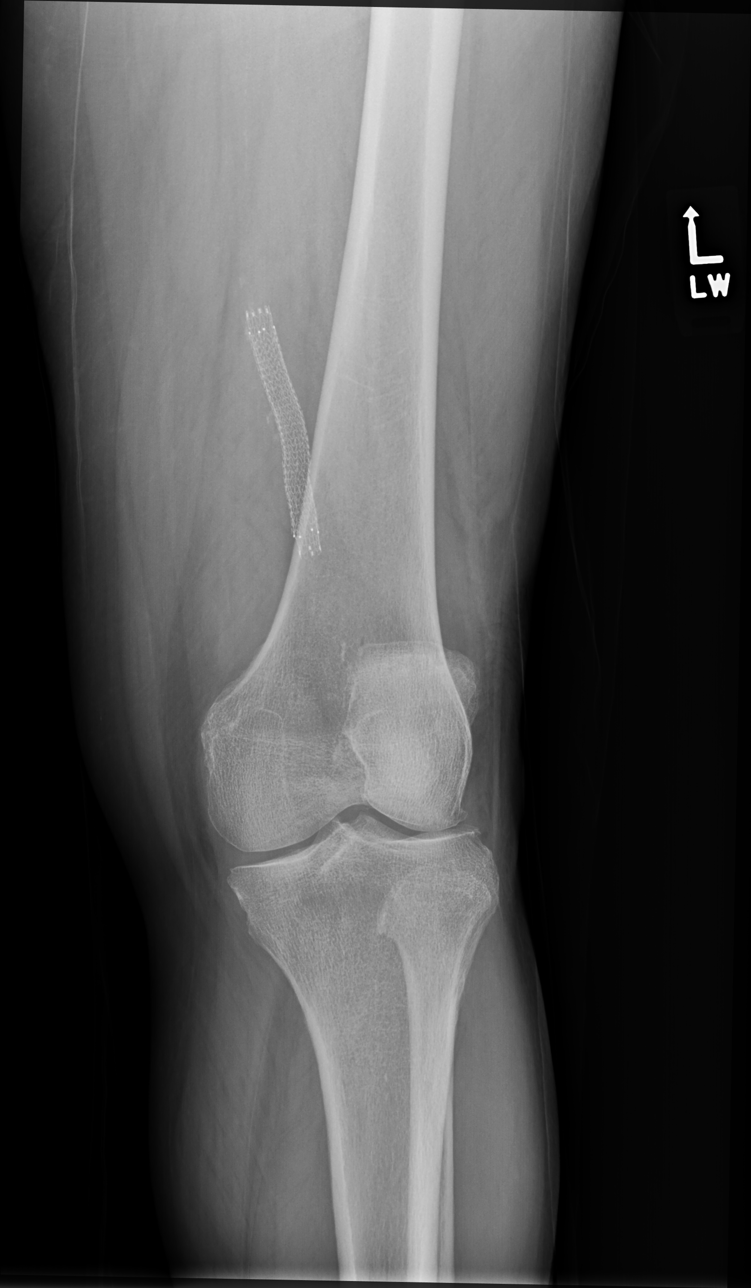

[knee standing internal ap]
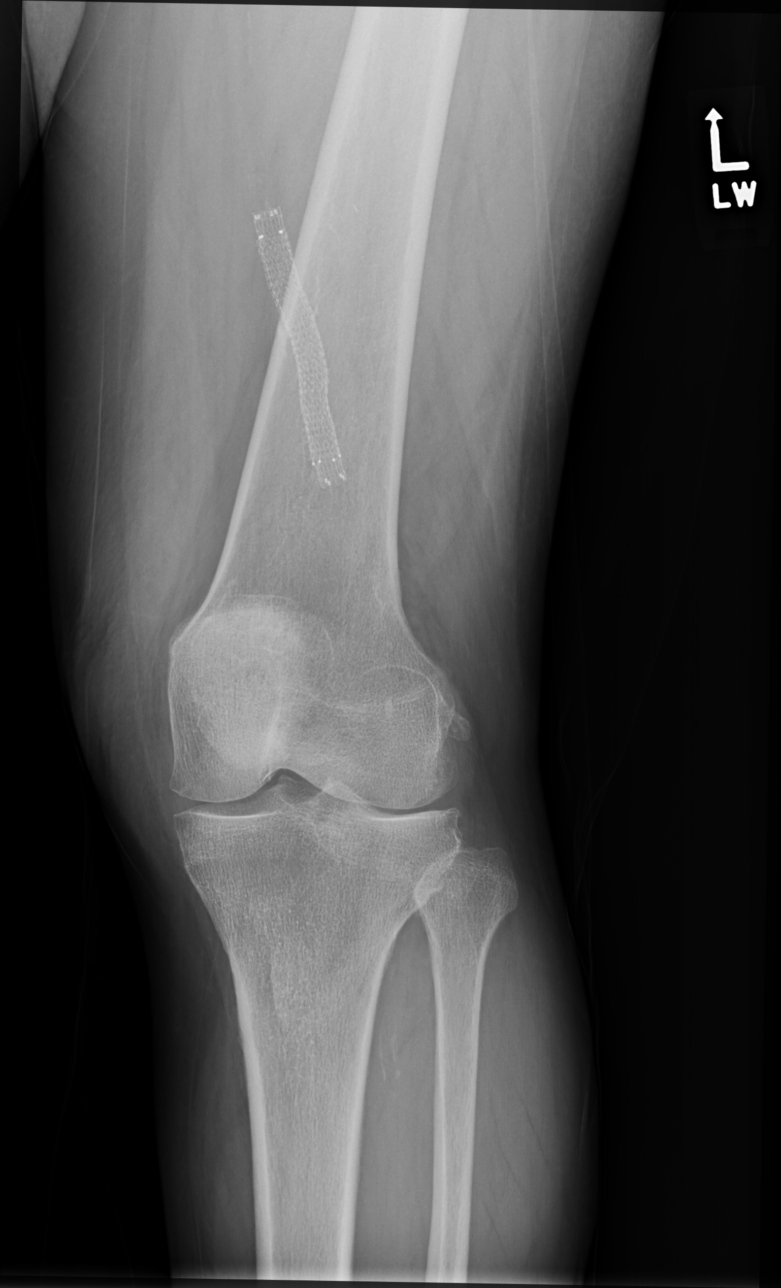

[knee standing lat]
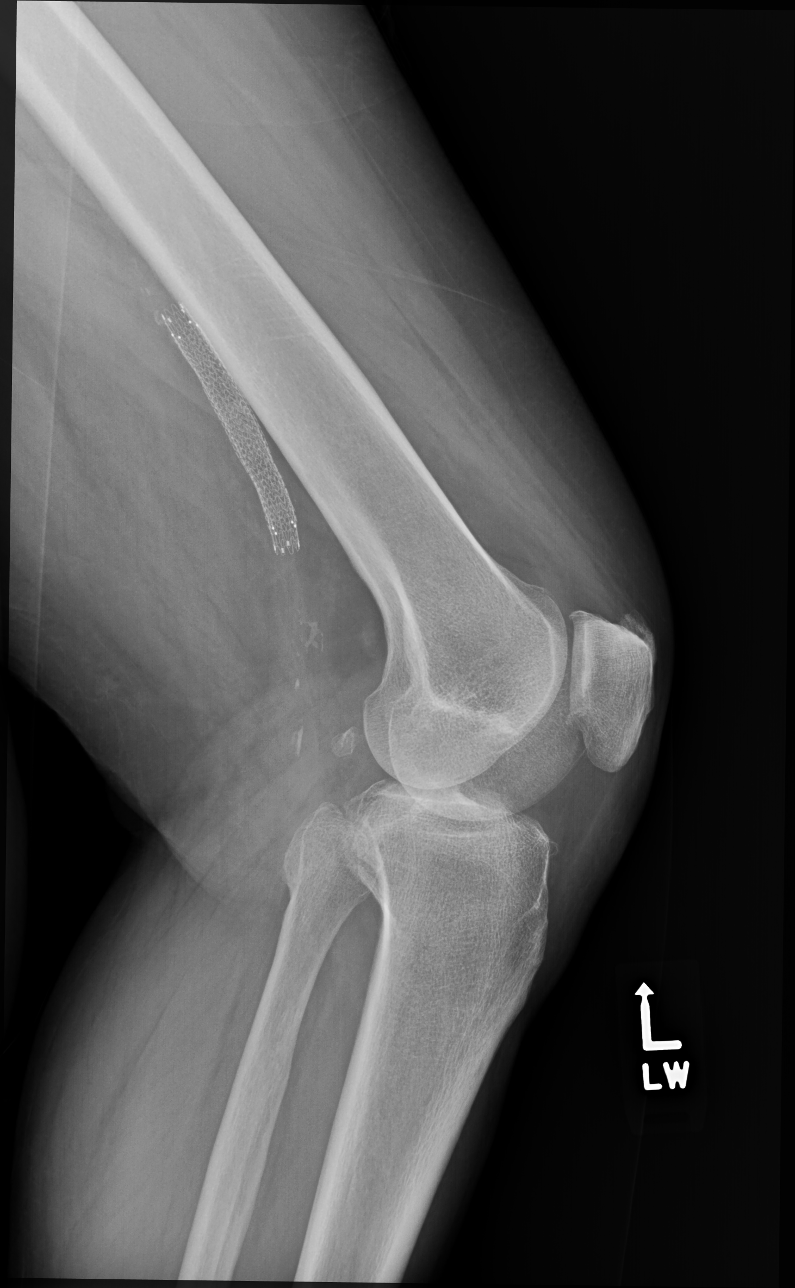

[knee [person_name] view pa]
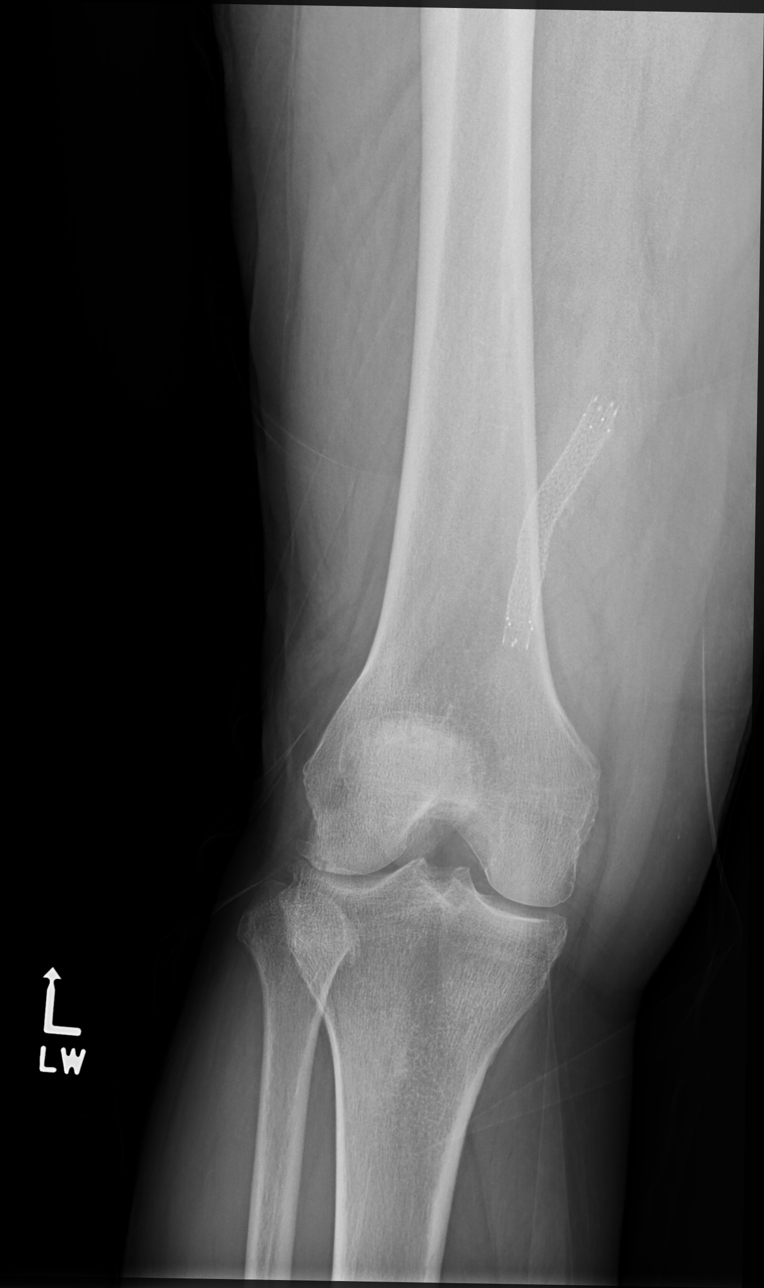

[5 of 5 positions shown; findings below may reference images not displayed]

FINDINGS: No evidence of fracture, dislocation, or joint effusion. Mild
narrowing of lateral joint space is noted with osteophyte formation.
Soft tissues are unremarkable.
IMPRESSION: Mild degenerative joint disease is noted laterally. No acute
abnormality seen.

## 2022-12-12 ENCOUNTER — Telehealth: Payer: Medicare PPO | Admitting: Family Medicine

## 2022-12-12 DIAGNOSIS — L89309 Pressure ulcer of unspecified buttock, unspecified stage: Secondary | ICD-10-CM

## 2022-12-12 NOTE — Progress Notes (Signed)
Natalie Watts   Needs to be seen in person for eval and treatment for wound/sore on buttocks.   Patient and daughter acknowledged agreement and understanding of the plan.

## 2022-12-13 ENCOUNTER — Other Ambulatory Visit: Payer: Self-pay

## 2022-12-13 ENCOUNTER — Telehealth: Payer: Self-pay | Admitting: Family Medicine

## 2022-12-13 ENCOUNTER — Ambulatory Visit (INDEPENDENT_AMBULATORY_CARE_PROVIDER_SITE_OTHER): Payer: Medicare PPO | Admitting: Family Medicine

## 2022-12-13 ENCOUNTER — Other Ambulatory Visit (HOSPITAL_COMMUNITY): Payer: Self-pay

## 2022-12-13 VITALS — BP 134/72 | HR 69 | Temp 97.8°F | Ht 61.0 in | Wt 125.8 lb

## 2022-12-13 DIAGNOSIS — B009 Herpesviral infection, unspecified: Secondary | ICD-10-CM | POA: Diagnosis not present

## 2022-12-13 DIAGNOSIS — L309 Dermatitis, unspecified: Secondary | ICD-10-CM

## 2022-12-13 MED ORDER — VALACYCLOVIR HCL 1 G PO TABS
1000.0000 mg | ORAL_TABLET | Freq: Three times a day (TID) | ORAL | 0 refills | Status: DC
Start: 2022-12-13 — End: 2023-04-06

## 2022-12-13 MED ORDER — VALACYCLOVIR HCL 1 G PO TABS
1000.0000 mg | ORAL_TABLET | Freq: Three times a day (TID) | ORAL | 0 refills | Status: DC
Start: 2022-12-13 — End: 2022-12-13
  Filled 2022-12-13: qty 30, 10d supply, fill #0

## 2022-12-13 MED ORDER — CLOBETASOL PROPIONATE 0.05 % EX CREA
1.0000 | TOPICAL_CREAM | Freq: Two times a day (BID) | CUTANEOUS | 0 refills | Status: AC
Start: 2022-12-13 — End: ?

## 2022-12-13 NOTE — Patient Instructions (Addendum)
It was a pleasure meeting you today. Thank you for allowing me to take part in your health care.  Our goals for today as we discussed include:  Start Valcyclovir 1000 mg three times a day for 10 days Cover lesions when draining   Recommend Shingles vaccine.  This is a 2 dose series and can be given at your local pharmacy.  Please talk to your pharmacist about this.  When infection is cleared.  You have 9 refills left on Clobetasol at the pharmacy.  Use as directed.  Can apply to both arms two times a  day for 5 days  Referral sent for Mammogram and Dexa scan. Please call to schedule appointment. [facility] [address] [place], [place] [address] [phone]    We will get some labs today.  If they are abnormal or we need to do something about them, I will call you.  If they are normal, I will send you a message on MyChart (if it is active) or a letter in the mail.  If you don't hear from [place] in 2 weeks, please call the office at the number below.   Follow up if no improvement  I value your feedback and you entrusting [place] with your care. If you get a [place] patient survey, I would appreciate you taking the time to let us know about your experience today. Thank you!        If you have any questions or concerns, please do not hesitate to call the office at [phone].  I look forward to our next visit and until then take care and stay safe.  Regards,   Dana Allan, MD   [facility]

## 2022-12-13 NOTE — Progress Notes (Signed)
SUBJECTIVE:   Chief Complaint  Patient presents with   Abscess    On right buttock for 3 days, itching both arms with a rash   HPI Patient presents to clinic with daughter for concern of rash to right buttocks.  This is an started 2 to 3 days ago with itching sensation.  Was noticed by home care worker who recommended that she follow-up with PCP.  She is also noticed that she had an itchy rash on her chest and arms bilaterally after sun exposure.  Denies any fevers, drainage or pain. Denies any change in creams, detergents or environment.   PERTINENT PMH / PSH: DM type II History of dermatitis History genital herpes  OBJECTIVE:  BP 134/72 (BP Location: Right Arm)   Pulse 69   Temp 97.8 F (36.6 C)   Ht 5\' 1"  (1.549 m)   Wt 125 lb 12.8 oz (57.1 kg)   SpO2 99%   BMI 23.77 kg/m    Physical Exam Skin:    Findings: Rash present. Rash is vesicular.     Comments: Fluid-filled vesicular lesions on erythematous base noted to right buttock  Scaling erythematous rash distributed bilateral forearms and chest consistent with dermatitis rash.        12/13/2022    9:05 AM 09/28/2022    8:27 AM 09/20/2022    3:20 PM 09/06/2022   10:15 AM 02/22/2022    9:40 AM  Depression screen PHQ 2/9  Decreased Interest 1 0 0 0 0  Down, Depressed, Hopeless 0 0 0 0 0  PHQ - 2 Score 1 0 0 0 0  Altered sleeping 0      Tired, decreased energy 1      Change in appetite 1      Feeling bad or failure about yourself  0      Trouble concentrating 0      Moving slowly or fidgety/restless 0      Suicidal thoughts 0      PHQ-9 Score 3          12/13/2022    9:06 AM 09/28/2022    8:27 AM 09/06/2022   10:15 AM  GAD 7 : Generalized Anxiety Score  Nervous, Anxious, on Edge 0 0 0  Control/stop worrying 0 0 0  Worry too much - different things 0 0 0  Trouble relaxing 1 0 0  Restless 0 0 0  Easily annoyed or irritable 0 0 0  Afraid - awful might happen 0 0 0  Total GAD 7 Score 1 0 0  Anxiety  Difficulty Somewhat difficult Not difficult at all Not difficult at all    ASSESSMENT/PLAN:  Herpes simplex Assessment & Plan: Right buttock lesion suspicious for HSV Check HSV serology Start Valtrex 1 g 3 times daily x 7 days Follow-up if no improvement in symptoms  Orders: -     Herpes Simplex Virus 1 and 2 (IgG), with Reflex to HSV-2 Inhibition -     valACYclovir HCl; Take 1 tablet (1,000 mg total) by mouth 3 (three) times daily for 10 days.  Dispense: 30 tablet; Refill: 0 -     HSV 2 INHIBITION  Dermatitis Assessment & Plan: Erythematous skin lesion likely dermatitis Patient has prescription for Temovate Recommend continued use Temovate 0.05% cream twice daily x 5 days.  Refill sent   Orders: -     Clobetasol Propionate; Apply 1 Application topically 2 (two) times daily. 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  Dana Allan, MD

## 2022-12-13 NOTE — Telephone Encounter (Signed)
I called pharmacy to cancel Rx at [facility] and Dr. Clent Ridges sent Rx to [facility] in [place].  I called and notified patient that Rx was sent to her preferred pharmacy.

## 2022-12-13 NOTE — Telephone Encounter (Signed)
Pt [facility]cl[facility] [place]

## 2022-12-14 DIAGNOSIS — M1991 Primary osteoarthritis, unspecified site: Secondary | ICD-10-CM | POA: Diagnosis not present

## 2022-12-14 DIAGNOSIS — Z6823 Body mass index (BMI) 23.0-23.9, adult: Secondary | ICD-10-CM | POA: Diagnosis not present

## 2022-12-14 DIAGNOSIS — M25562 Pain in left knee: Secondary | ICD-10-CM | POA: Diagnosis not present

## 2022-12-14 DIAGNOSIS — M0579 Rheumatoid arthritis with rheumatoid factor of multiple sites without organ or systems involvement: Secondary | ICD-10-CM | POA: Diagnosis not present

## 2022-12-14 DIAGNOSIS — M79642 Pain in left hand: Secondary | ICD-10-CM | POA: Diagnosis not present

## 2022-12-14 DIAGNOSIS — M79641 Pain in right hand: Secondary | ICD-10-CM | POA: Diagnosis not present

## 2022-12-14 DIAGNOSIS — G8929 Other chronic pain: Secondary | ICD-10-CM | POA: Diagnosis not present

## 2022-12-14 DIAGNOSIS — R5382 Chronic fatigue, unspecified: Secondary | ICD-10-CM | POA: Diagnosis not present

## 2022-12-14 LAB — HERPES SIMPLEX VIRUS 1 AND 2 (IGG),REFLEX HSV-2 INHIBITION: HAV 1 IGG,TYPE SPECIFIC AB: <0.9 index

## 2022-12-15 ENCOUNTER — Ambulatory Visit: Payer: Medicare PPO | Admitting: Podiatry

## 2022-12-18 ENCOUNTER — Encounter: Payer: Self-pay | Admitting: Family Medicine

## 2022-12-18 DIAGNOSIS — L309 Dermatitis, unspecified: Secondary | ICD-10-CM | POA: Insufficient documentation

## 2022-12-18 DIAGNOSIS — B009 Herpesviral infection, unspecified: Secondary | ICD-10-CM | POA: Insufficient documentation

## 2022-12-18 NOTE — Assessment & Plan Note (Signed)
Erythematous skin lesion likely dermatitis Patient has prescription for Temovate Recommend continued use Temovate 0.05% cream twice daily x 5 days.  Refill sent

## 2022-12-18 NOTE — Assessment & Plan Note (Signed)
Right buttock lesion suspicious for HSV Check HSV serology Start Valtrex 1 g 3 times daily x 7 days Follow-up if no improvement in symptoms

## 2022-12-19 ENCOUNTER — Ambulatory Visit (INDEPENDENT_AMBULATORY_CARE_PROVIDER_SITE_OTHER): Payer: Medicare PPO | Admitting: Podiatry

## 2022-12-19 DIAGNOSIS — Z91199 Patient's noncompliance with other medical treatment and regimen due to unspecified reason: Secondary | ICD-10-CM

## 2022-12-19 NOTE — Progress Notes (Signed)
1. No-show for appointment     

## 2022-12-28 ENCOUNTER — Ambulatory Visit: Payer: Medicare PPO | Admitting: Family Medicine

## 2022-12-28 ENCOUNTER — Other Ambulatory Visit: Payer: Self-pay | Admitting: Pharmacist

## 2022-12-28 VITALS — BP 114/78 | HR 98 | Temp 98.5°F | Ht 61.0 in | Wt 124.8 lb

## 2022-12-28 DIAGNOSIS — F419 Anxiety disorder, unspecified: Secondary | ICD-10-CM

## 2022-12-28 DIAGNOSIS — B009 Herpesviral infection, unspecified: Secondary | ICD-10-CM | POA: Diagnosis not present

## 2022-12-28 DIAGNOSIS — R350 Frequency of micturition: Secondary | ICD-10-CM

## 2022-12-28 DIAGNOSIS — F32A Depression, unspecified: Secondary | ICD-10-CM | POA: Diagnosis not present

## 2022-12-28 DIAGNOSIS — F39 Unspecified mood [affective] disorder: Secondary | ICD-10-CM | POA: Diagnosis not present

## 2022-12-28 LAB — POCT URINALYSIS DIPSTICK
Bilirubin, UA: NEGATIVE
Blood, UA: NEGATIVE
Glucose, UA: POSITIVE — AB
Ketones, UA: NEGATIVE
Leukocytes, UA: NEGATIVE
Nitrite, UA: NEGATIVE
Protein, UA: POSITIVE — AB
Spec Grav, UA: 1.02 (ref 1.010–1.025)
Urobilinogen, UA: 0.2 E.U./dL
pH, UA: 5 (ref 5.0–8.0)

## 2022-12-28 NOTE — Progress Notes (Signed)
Natalie Alar, MD Phone: [phone]  Natalie Watts is a 77 y.o. female who presents today for follow-up.  HSV-2 rash: Patient notes this was on her right buttocks.  She did see her PCP previously for this and they placed her on Valtrex.  She feels as though this has been healing up.  She does report a history of HSV-2 in the past.  Patient notes she needs a letter for home health regarding the rash.  Anxiety/depression: Patient notes she does not think she is anxious or depressed though reports her daughter does.  Patient is on Cymbalta 30 mg daily.  She feels alone some though does live with her family.  She notes no SI.  Her daughter wrote a note asking for therapy for the patient and the patient is willing to do this.  Urinary frequency: Patient reports increased urination recently.  She notes the urine color change with addition of a vitamin.  She notes no dysuria.  Social History   Tobacco Use  Smoking Status Former   Packs/day: 1.00   Years: 45.00   Additional pack years: 0.00   Total pack years: 45.00   Types: Cigarettes   Quit date: 12/18/2012   Years since quitting: 10.0   Passive exposure: Never  Smokeless Tobacco Never    Current Outpatient Medications on File Prior to Visit  Medication Sig Dispense Refill   acetaminophen (TYLENOL) 500 MG tablet Take 1,000 mg by mouth 2 (two) times daily as needed for moderate pain or mild pain. Rapid release     amLODipine (NORVASC) 5 MG tablet Take 1 tablet (5 mg total) by mouth daily. 90 tablet 1   B-D UF III MINI PEN NEEDLES 31G X 5 MM MISC Inject 1 each into the skin 2 (two) times daily. Or preferred choice of the patient 300 each 3   Blood Glucose Monitoring Suppl (ONE TOUCH ULTRA 2) w/Device KIT Used to check blood sugars daily. 1 each 0   Cholecalciferol (VITAMIN D3) 50 MCG (2000 UT) capsule Take 2,000 Units by mouth daily.     clobetasol cream (TEMOVATE) 0.05 % Apply 1 Application topically 2 (two) times daily. Legs and hands  prn 30 g 0   clopidogrel (PLAVIX) 75 MG tablet Take 1 tablet (75 mg total) by mouth daily. 90 tablet 1   cyanocobalamin (VITAMIN B12) 1000 MCG tablet Take 1 tablet (1,000 mcg total) by mouth daily. 90 tablet 1   Dulaglutide (TRULICITY) 3 MG/0.5ML SOPN Inject 3 mg as directed once a week. 2 mL 2   DULoxetine (CYMBALTA) 30 MG capsule Take 1 capsule (30 mg total) by mouth daily. 90 capsule 1   empagliflozin (JARDIANCE) 10 MG TABS tablet Take 1 tablet (10 mg total) by mouth daily before breakfast. 90 tablet 1   ezetimibe (ZETIA) 10 MG tablet Take 1 tablet (10 mg total) by mouth daily. 90 tablet 1   Lancets Ultra Fine MISC 1 Device by Does not apply route 2 (two) times daily as needed. Preferred for her device 180 each 3   LANTUS SOLOSTAR 100 UNIT/ML Solostar Pen Inject 30 Units into the skin daily. 15 mL 11   leflunomide (ARAVA) 20 MG tablet Take 20 mg by mouth daily. rheumatology     leflunomide (ARAVA) 20 MG tablet Take 1 tablet (20 mg total) by mouth daily. 90 tablet 0   losartan (COZAAR) 100 MG tablet Take 1 tablet (100 mg total) by mouth daily. 90 tablet 1   nebivolol (BYSTOLIC) 5 MG tablet  Take 1 tablet (5 mg total) by mouth daily. 90 tablet 1   potassium chloride SA (KLOR-CON M) 20 MEQ tablet Take 1 tablet (20 mEq total) by mouth every other day. 45 tablet 3   thiamine (VITAMIN B1) 100 MG tablet Take 1 tablet (100 mg total) by mouth daily. 90 tablet 1   [DISCONTINUED] rosuvastatin (CRESTOR) 20 MG tablet Take 1 tablet (20 mg total) by mouth daily. 90 tablet 3   No current facility-administered medications on file prior to visit.     ROS see history of present illness  Objective  Physical Exam Vitals:   12/28/22 1335  BP: 114/78  Pulse: 98  Temp: 98.5 F (36.9 C)  SpO2: 98%    BP Readings from Last 3 Encounters:  12/28/22 114/78  12/13/22 134/72  11/01/22 118/82   Wt Readings from Last 3 Encounters:  12/28/22 124 lb 12.8 oz (56.6 kg)  12/13/22 125 lb 12.8 oz (57.1 kg)   11/01/22 122 lb (55.3 kg)    Physical Exam Constitutional:      General: She is not in acute distress.    Appearance: She is not diaphoretic.  Cardiovascular:     Rate and Rhythm: Normal rate and regular rhythm.     Heart sounds: Normal heart sounds.  Pulmonary:     Effort: Pulmonary effort is normal.     Breath sounds: Normal breath sounds.  Skin:    General: Skin is warm and dry.       Neurological:     Mental Status: She is alert.      Assessment/Plan: Please see individual problem list.  Urine frequency Assessment & Plan: Check urinalysis.  Discussed change in urine color may be related to the vitamin that she just added.  Orders: -     POCT urinalysis dipstick  Anxiety and depression -     Ambulatory referral to Psychology  Herpes simplex type 2 infection Assessment & Plan: Patient with chronic HSV-2 infection.  Flared recently.  Rash appears to have resolved and now just has some hyperpigmented skin.  Patient will monitor for recurrence.  Letter provided for home health.   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  Possibly worsened.  She would like to continue Cymbalta 30 mg daily.  She agrees to referral for therapy.  Referral placed.    Patient brought in a note from her daughter asking for preauthorization for personal care services at home.  I advised that this would need to be completed by the patient's PCP.  The patient noted that she figured it would need to be done by Dr. Clent Ridges.  During the visit the patient did sit down in her chair and lightly bumped the back of her head onto the wall.  She noted she was fine afterwards.  This was not a significant contact between her head and the wall.  Return in about 3 months (around 03/30/2023) for with PCP.   Natalie Alar, MD [facility]

## 2022-12-28 NOTE — Patient Instructions (Signed)
A therapist will call to set up a visit for your depression and anxiety. We will contact you with your urine results.

## 2022-12-28 NOTE — Progress Notes (Unsigned)
Care Coordination Call  Patient contacts me to day with questions regarding her medications and fills. She is confused about what she is supposed to be taking and where it comes from.   She is due for fills from [facility] of:  - amlodipine - clopidogrel - Trulicity  - duloxetine - Jardiance - ezetimibe - Lantus - leflunomide - losartan - nebivolol  Previously received message from vascular PA that Xarelto can be discontinued if cost is a barrier. There is not currently Xarelto patient assistance.  Collaborated with pharmacy to request refills on patient's behalf for the medications she should be taking.   Catie Eppie Gibson, PharmD, BCACP, CPP Clinical Pharmacist [facility] [phone]

## 2022-12-28 NOTE — Assessment & Plan Note (Addendum)
Patient with chronic HSV-2 infection.  Flared recently.  Rash appears to have resolved and now just has some hyperpigmented skin.  Patient will monitor for recurrence.  Letter provided for home health.

## 2022-12-28 NOTE — Assessment & Plan Note (Signed)
Check urinalysis.  Discussed change in urine color may be related to the vitamin that she just added.

## 2022-12-28 NOTE — Assessment & Plan Note (Signed)
Chronic issue.  Possibly worsened.  She would like to continue Cymbalta 30 mg daily.  She agrees to referral for therapy.  Referral placed.

## 2022-12-29 ENCOUNTER — Other Ambulatory Visit (HOSPITAL_COMMUNITY): Payer: Self-pay

## 2022-12-29 ENCOUNTER — Other Ambulatory Visit (HOSPITAL_BASED_OUTPATIENT_CLINIC_OR_DEPARTMENT_OTHER): Payer: Self-pay

## 2022-12-29 LAB — HERPES SIMPLEX VIRUS 1 AND 2 (IGG),REFLEX HSV-2 INHIBITION: HSV 2 IGG,TYPE SPECIFIC AB: 21.5 index — ABNORMAL HIGH

## 2022-12-29 LAB — HSV 2 INHIBITION: HSV 2 IGG INHIBITION,IA: POSITIVE — AB

## 2022-12-29 NOTE — Progress Notes (Signed)
Care Coordination Call  Received call from [facility] - copay for Jardiance, Trulicity, Lantus. Patient had not gotten back to me.   Discussed income. She should qualify for Extra Help. Assisted in completing application today. Patient aware to call me when she receives the determination letter in the mail.   Collaborated with pharmacy to fill 1 month supply of brand name medications, 3 month supply of other medications. They will fill and mail.  Catie Eppie Gibson, PharmD, BCACP, CPP Clinical Pharmacist [facility] [phone]

## 2022-12-30 ENCOUNTER — Other Ambulatory Visit: Payer: Self-pay | Admitting: Family Medicine

## 2022-12-30 ENCOUNTER — Other Ambulatory Visit (HOSPITAL_COMMUNITY): Payer: Self-pay

## 2022-12-30 ENCOUNTER — Other Ambulatory Visit: Payer: Self-pay

## 2022-12-30 DIAGNOSIS — R809 Proteinuria, unspecified: Secondary | ICD-10-CM

## 2022-12-30 MED ORDER — LEFLUNOMIDE 20 MG PO TABS
20.0000 mg | ORAL_TABLET | Freq: Every day | ORAL | 0 refills | Status: DC
  Filled 2022-12-30: qty 90, 90d supply, fill #0

## 2023-01-02 ENCOUNTER — Other Ambulatory Visit: Payer: Self-pay

## 2023-01-24 ENCOUNTER — Emergency Department: Payer: Medicare PPO

## 2023-01-24 ENCOUNTER — Telehealth: Payer: Self-pay | Admitting: Family Medicine

## 2023-01-24 ENCOUNTER — Other Ambulatory Visit: Payer: Self-pay

## 2023-01-24 ENCOUNTER — Other Ambulatory Visit: Payer: Self-pay | Admitting: Family Medicine

## 2023-01-24 ENCOUNTER — Encounter: Payer: Self-pay | Admitting: Emergency Medicine

## 2023-01-24 ENCOUNTER — Emergency Department
Admission: EM | Admit: 2023-01-24 | Discharge: 2023-01-24 | Disposition: A | Discharge status: Home / Self Care | Payer: Medicare PPO | Attending: Emergency Medicine | Admitting: Emergency Medicine

## 2023-01-24 DIAGNOSIS — M25552 Pain in left hip: Secondary | ICD-10-CM | POA: Diagnosis not present

## 2023-01-24 DIAGNOSIS — W19XXXA Unspecified fall, initial encounter: Secondary | ICD-10-CM | POA: Diagnosis not present

## 2023-01-24 DIAGNOSIS — I6523 Occlusion and stenosis of bilateral carotid arteries: Secondary | ICD-10-CM | POA: Diagnosis not present

## 2023-01-24 DIAGNOSIS — Y92009 Unspecified place in unspecified non-institutional (private) residence as the place of occurrence of the external cause: Secondary | ICD-10-CM | POA: Diagnosis not present

## 2023-01-24 DIAGNOSIS — M79672 Pain in left foot: Secondary | ICD-10-CM | POA: Insufficient documentation

## 2023-01-24 DIAGNOSIS — S0990XA Unspecified injury of head, initial encounter: Secondary | ICD-10-CM | POA: Insufficient documentation

## 2023-01-24 DIAGNOSIS — R450 Nervousness: Secondary | ICD-10-CM | POA: Diagnosis not present

## 2023-01-24 DIAGNOSIS — I10 Essential (primary) hypertension: Secondary | ICD-10-CM | POA: Diagnosis not present

## 2023-01-24 DIAGNOSIS — Z043 Encounter for examination and observation following other accident: Secondary | ICD-10-CM | POA: Diagnosis not present

## 2023-01-24 DIAGNOSIS — M25562 Pain in left knee: Secondary | ICD-10-CM | POA: Diagnosis not present

## 2023-01-24 DIAGNOSIS — T07XXXA Unspecified multiple injuries, initial encounter: Secondary | ICD-10-CM

## 2023-01-24 DIAGNOSIS — N183 Chronic kidney disease, stage 3 unspecified: Secondary | ICD-10-CM | POA: Diagnosis not present

## 2023-01-24 DIAGNOSIS — S8002XA Contusion of left knee, initial encounter: Secondary | ICD-10-CM | POA: Diagnosis not present

## 2023-01-24 DIAGNOSIS — M1612 Unilateral primary osteoarthritis, left hip: Secondary | ICD-10-CM | POA: Diagnosis not present

## 2023-01-24 DIAGNOSIS — I6381 Other cerebral infarction due to occlusion or stenosis of small artery: Secondary | ICD-10-CM | POA: Diagnosis not present

## 2023-01-24 DIAGNOSIS — M47816 Spondylosis without myelopathy or radiculopathy, lumbar region: Secondary | ICD-10-CM | POA: Diagnosis not present

## 2023-01-24 DIAGNOSIS — M545 Low back pain, unspecified: Secondary | ICD-10-CM | POA: Diagnosis not present

## 2023-01-24 DIAGNOSIS — Q666 Other congenital valgus deformities of feet: Secondary | ICD-10-CM | POA: Diagnosis not present

## 2023-01-24 DIAGNOSIS — R9089 Other abnormal findings on diagnostic imaging of central nervous system: Secondary | ICD-10-CM | POA: Diagnosis not present

## 2023-01-24 MED ORDER — ACETAMINOPHEN 325 MG PO TABS
650.0000 mg | ORAL_TABLET | Freq: Once | ORAL | Status: AC
  Administered 2023-01-24: 650 mg via ORAL
  Filled 2023-01-24: qty 2

## 2023-01-24 NOTE — ED Triage Notes (Signed)
Pt comes via EMs from home and had an unwitnessed fall. Pt denies any loc or hitting head. Pt is on thinners. Pt states her knees gave out and she slide down on her butt.  VSS

## 2023-01-24 NOTE — ED Provider Notes (Signed)
[facility] Provider Note    Event Date/Time   First MD Initiated Contact with Patient 01/24/23 1105     (approximate)   History   Fall   HPI  Natalie Watts is a 77 y.o. female with extensive past medical history including arthritis, and stage III CKD presents emergency department after a slight fall.  States she fell against the door and slid to the ground.  Patient is on blood thinners.  Denies headache.  Complaining of pain in the left hip, left knee and left foot.  Incident happened today.      Physical Exam   Triage Vital Signs: ED Triage Vitals  Encounter Vitals Group     BP 01/24/23 1031 (!) 156/84     Systolic BP Percentile --      Diastolic BP Percentile --      Pulse Rate 01/24/23 1031 67     Resp 01/24/23 1031 16     Temp 01/24/23 1031 98.6 F (37 C)     Temp Source 01/24/23 1031 Oral     SpO2 01/24/23 1031 99 %     Weight 01/24/23 1035 124 lb 12.5 oz (56.6 kg)     Height 01/24/23 1035 5\' 1"  (1.549 m)     Head Circumference --      Peak Flow --      Pain Score 01/24/23 1016 6     Pain Loc --      Pain Education --      Exclude from Growth Chart --     Most recent vital signs: Vitals:   01/24/23 1031  BP: (!) 156/84  Pulse: 67  Resp: 16  Temp: 98.6 F (37 C)  SpO2: 99%     General: Awake, no distress.   CV:  Good peripheral perfusion. regular rate and  rhythm Resp:  Normal effort. Lungs cta Abd:  No distention.   Other:  Tender in the left hip, left knee left foot, C-spine mildly tender, patient appears to be alert and oriented, no confusion   ED Results / Procedures / Treatments   Labs (all labs ordered are listed, but only abnormal results are displayed) Labs Reviewed - No data to display   EKG     RADIOLOGY CT of the head cervical spine, x-ray left hip, left knee and left foot    PROCEDURES:   Procedures   MEDICATIONS ORDERED IN ED: Medications  acetaminophen (TYLENOL) tablet 650 mg (650 mg  Oral Given 01/24/23 1304)     IMPRESSION / MDM / ASSESSMENT AND PLAN / ED COURSE  I reviewed the triage vital signs and the nursing notes.                              Differential diagnosis includes, but is not limited to, fracture, contusion, strain, subdural, subarachnoid  Patient's presentation is most consistent with acute presentation with potential threat to life or bodily function.   Due to the patient being on blood thinners we will go ahead and did CT head and cervical spine  I did individually interpreted and reviewed the radiologist reading as being negative for any acute abnormality  X-ray of the left hip, left knee and left foot were independently reviewed interpreted by me as being negative for any acute abnormality.  .  I did explain the findings to the patient.  Tried to call her daughter [name]..  No answer.  Daughter came to the hospital.  All findings were discussed with the daughter and the mother.  Did discuss having physical therapy and home health assess her due to the recent multiple falls.  Sent a consult to social work for them to follow-up.  They are in agreement with this treatment plan.  Patient was discharged stable condition in the care of her daughter   FINAL CLINICAL IMPRESSION(S) / ED DIAGNOSES   Final diagnoses:  Fall, initial encounter  Multiple contusions     Rx / DC Orders   ED Discharge Orders     None        Note:  This document was prepared using Dragon voice recognition software and may include unintentional dictation errors.    Faythe Ghee, PA-C 01/24/23 1821    Phineas Semen, MD 01/27/23 [phone]

## 2023-01-24 NOTE — Telephone Encounter (Signed)
Patient's daughter Natalie Watts called, [phone]. She wanted Dr Clent Ridges to know that mother is in ED, She feel again. Daughter is requesting home health or assistant care facility.

## 2023-01-27 NOTE — Addendum Note (Signed)
Addended by: Vertis Kelch on: 01/27/2023 08:32 AM   Modules accepted: Orders

## 2023-01-27 NOTE — Telephone Encounter (Signed)
Can you please help with this the daughter said mom insurance will pay for caregiver. Please call [place] with any question.[phone]

## 2023-01-30 ENCOUNTER — Encounter: Payer: Self-pay | Admitting: Family Medicine

## 2023-01-31 ENCOUNTER — Encounter: Payer: Self-pay | Admitting: Family Medicine

## 2023-01-31 ENCOUNTER — Ambulatory Visit (INDEPENDENT_AMBULATORY_CARE_PROVIDER_SITE_OTHER): Payer: Medicare PPO | Admitting: Family Medicine

## 2023-01-31 ENCOUNTER — Ambulatory Visit: Payer: Self-pay

## 2023-01-31 VITALS — BP 144/72 | HR 60 | Temp 97.9°F | Resp 16 | Ht 63.0 in | Wt 126.1 lb

## 2023-01-31 DIAGNOSIS — Z111 Encounter for screening for respiratory tuberculosis: Secondary | ICD-10-CM

## 2023-01-31 DIAGNOSIS — E782 Mixed hyperlipidemia: Secondary | ICD-10-CM

## 2023-01-31 DIAGNOSIS — R748 Abnormal levels of other serum enzymes: Secondary | ICD-10-CM

## 2023-01-31 DIAGNOSIS — Z794 Long term (current) use of insulin: Secondary | ICD-10-CM

## 2023-01-31 DIAGNOSIS — E1159 Type 2 diabetes mellitus with other circulatory complications: Secondary | ICD-10-CM | POA: Diagnosis not present

## 2023-01-31 DIAGNOSIS — E1151 Type 2 diabetes mellitus with diabetic peripheral angiopathy without gangrene: Secondary | ICD-10-CM | POA: Diagnosis not present

## 2023-01-31 DIAGNOSIS — I152 Hypertension secondary to endocrine disorders: Secondary | ICD-10-CM

## 2023-01-31 DIAGNOSIS — Z7984 Long term (current) use of oral hypoglycemic drugs: Secondary | ICD-10-CM

## 2023-01-31 DIAGNOSIS — N1832 Chronic kidney disease, stage 3b: Secondary | ICD-10-CM | POA: Diagnosis not present

## 2023-01-31 DIAGNOSIS — Z139 Encounter for screening, unspecified: Secondary | ICD-10-CM

## 2023-01-31 DIAGNOSIS — M069 Rheumatoid arthritis, unspecified: Secondary | ICD-10-CM | POA: Diagnosis not present

## 2023-01-31 DIAGNOSIS — Z7985 Long-term (current) use of injectable non-insulin antidiabetic drugs: Secondary | ICD-10-CM

## 2023-01-31 MED ORDER — BD PEN NEEDLE MINI U/F 31G X 5 MM MISC
1.0000 | Freq: Two times a day (BID) | 3 refills | Status: AC
Start: 2023-01-31 — End: ?

## 2023-01-31 NOTE — Patient Instructions (Signed)
It was a pleasure meeting you today. Thank you for allowing me to take part in your health care.  Our goals for today as we discussed include:  TB test today Schedule RN clinic for follow up no later than Friday at 3pm  Will review forms and complete for Friday  Referral sent for Social work to help with living assistance  We will get some labs today.  If they are abnormal or we need to do something about them, I will call you.  If they are normal, I will send you a message on MyChart (if it is active) or a letter in the mail.  If you don't hear from [place] in 2 weeks, please call the office at the number below.    If you have any questions or concerns, please do not hesitate to call the office at [phone].  I look forward to our next visit and until then take care and stay safe.  Regards,   Dana Allan, MD   [facility]

## 2023-01-31 NOTE — Progress Notes (Signed)
PPD Placement note [name], 77 y.o. female is here today for placement of PPD test Reason for PPD test: Home health  Pt taken PPD test before: yes Verified in allergy area and with patient that they are not allergic to the products PPD is made of (Phenol or Tween). No:  Is patient taking any oral or IV steroid medication now or have they taken it in the last month? no Has the patient ever received the BCG vaccine?: no Has the patient been in recent contact with anyone known or suspected of having active TB disease?: no      Date of exposure (if applicable): N?A      Name of person they were exposed to (if applicable): N?A Patient's Country of origin?: [place] O: Alert and oriented in NAD. P:  PPD placed on 01/31/2023.  Patient advised to return for reading within 48-72 hours.

## 2023-01-31 NOTE — Patient Instructions (Signed)
Visit Information  Thank you for taking time to visit with me today. Please don't hesitate to contact me if I can be of assistance to you.   Following are the goals we discussed today:   Goals Addressed             This Visit's Progress    management of falls and chronic health conditions       Interventions Today    Flowsheet Row Most Recent Value  Chronic Disease   Chronic disease during today's visit Other  [falls, bilateral knee pain]  General Interventions   General Interventions Discussed/Reviewed General Interventions Discussed, Doctor Visits  [evaluatioin of current treatment plan due to falls/ bilateral knee pain and patients adherence to plan as established by provider  Assessed knee pain level.]  Doctor Visits Discussed/Reviewed Doctor Visits Discussed  [reviewed upcoming provider visits. Advised patient to talk with her doctor regarding ongoing bilateral knee pain unrelieved by OTC.]  Exercise Interventions   Exercise Discussed/Reviewed Physical Activity  [Assessed for physical activity]  Education Interventions   Education Provided Provided Education  Provided Verbal Education On Other  [confirmed patient has transportation to appointment.]  Mental Health Interventions   Mental Health Discussed/Reviewed Other, Mental Health Discussed  [Confirmed patient has received call from counseling service for appointment.]  Pharmacy Interventions   Pharmacy Dicussed/Reviewed Pharmacy Topics Discussed  [medications reviewed. Advised to take medications as prescribed. Suggested patient use pill box for medications due to her concerns about the bubble packs from pharmacy.  Advised patient to take medications with her to primary provider appointment today.]  Safety Interventions   Safety Discussed/Reviewed Fall Risk, Safety Discussed  [Reviewed fall precaution recommendations and sent patient education article in Mychart on fall precautions. Advised to use ambulatory device at all times.  Assessed for home assistance.]              Our next appointment is by telephone on 03/02/23 at 11 am  Please call the care guide team at [phone] if you need to cancel or reschedule your appointment.   If you are experiencing a Mental Health or Behavioral Health Crisis or need someone to talk to, please call the Suicide and Crisis Lifeline: 988 call 1-800-273-TALK (toll free, 24 hour hotline)  Patient verbalizes understanding of instructions and care plan provided today and agrees to view in MyChart. Active MyChart status and patient understanding of how to access instructions and care plan via MyChart confirmed with patient.     George Ina RN,BSN,CCM [facility] Care Coordination [phone] direct line

## 2023-01-31 NOTE — Progress Notes (Signed)
SUBJECTIVE:   Chief Complaint  Patient presents with   Follow-up   HPI Presents to clinic for ED follow up  Was recently seen at [facility] ED 08/06 for fall and multiple contusions.  Imaging completed and xray of left hip, left knee and left foot negative for acute fracture or dislocation.  CT head and C Spine negative for acute bleed or infarct and negative for fracture or dislocation.   She has had multiple falls in the past and was evaluated by Neurology initially for worsening symptoms of worsening gait and unsteadiness.   Had been evaluated by Neurosurgery at [facility] and plan for surgery however per note from 06/23, was apparently given different opinion and approach for surgery, so she was referred to [facility] Neurosurgery.   Requesting Home health for assistance at home. A referral was sent to SW during ED visit however patient had not heard from them.  She is now living with her daughter who works daily and cannot provide care for her 24 hrs day.  She is requesting home aid given increasing falls.  Also requesting forms be completed for Adult Day Program.  Requires PPD testing today  PERTINENT PMH / PSH: OA RA Elevated CK HTN DM T2  OBJECTIVE:  BP (!) 144/72   Pulse 60   Temp 97.9 F (36.6 C)   Resp 16   Ht 5\' 3"  (1.6 m)   Wt 126 lb 2 oz (57.2 kg)   SpO2 97%   BMI 22.34 kg/m    Physical Exam Vitals reviewed.  Constitutional:      General: She is not in acute distress.    Appearance: Normal appearance. She is normal weight. She is not ill-appearing, toxic-appearing or diaphoretic.  Eyes:     General:        Right eye: No discharge.        Left eye: No discharge.     Conjunctiva/sclera: Conjunctivae normal.  Cardiovascular:     Rate and Rhythm: Normal rate and regular rhythm.     Heart sounds: Normal heart sounds.  Pulmonary:     Effort: Pulmonary effort is normal.     Breath sounds: Normal breath sounds.  Abdominal:     General: Bowel sounds are normal.   Musculoskeletal:        General: No swelling or tenderness.  Skin:    General: Skin is warm and dry.  Neurological:     General: No focal deficit present.     Mental Status: She is alert and oriented to person, place, and time. Mental status is at baseline.     Motor: Weakness present.     Gait: Gait abnormal.  Psychiatric:        Mood and Affect: Mood normal.        Behavior: Behavior normal.        Thought Content: Thought content normal.        Judgment: Judgment normal.        01/31/2023    2:59 PM 12/28/2022    1:46 PM 12/13/2022    9:05 AM 09/28/2022    8:27 AM 09/20/2022    3:20 PM  Depression screen PHQ 2/9  Decreased Interest 1 1 1  0 0  Down, Depressed, Hopeless 0 1 0 0 0  PHQ - 2 Score 1 2 1  0 0  Altered sleeping 1 1 0    Tired, decreased energy 2 2 1     Change in appetite 2 2 1  Feeling bad or failure about yourself   1 0    Trouble concentrating 0 1 0    Moving slowly or fidgety/restless 2 0 0    Suicidal thoughts 0 0 0    PHQ-9 Score 8 9 3     Difficult doing work/chores Somewhat difficult Somewhat difficult         01/31/2023    2:59 PM 12/28/2022    1:47 PM 12/13/2022    9:06 AM 09/28/2022    8:27 AM  GAD 7 : Generalized Anxiety Score  Nervous, Anxious, on Edge 1 1 0 0  Control/stop worrying 2 2 0 0  Worry too much - different things 1 2 0 0  Trouble relaxing 1 1 1  0  Restless 1 1 0 0  Easily annoyed or irritable 2 1 0 0  Afraid - awful might happen 0 2 0 0  Total GAD 7 Score 8 10 1  0  Anxiety Difficulty Somewhat difficult Somewhat difficult Somewhat difficult Not difficult at all    ASSESSMENT/PLAN:  Encounter for screening involving social determinants of health [facility]) Assessment & Plan: Referral placed to [facility]  Given history of multiple falls requires assistance with ADLs, gait, balance and strengthening. PPD today, return 08/16 for reading. Forms to be completed for Adult Day program after results of PPD  Orders: -     AMB Referral to  [facility] (ACO Patients)  Hypertension associated with diabetes [facility]) Assessment & Plan: Chronic.  Initially elevated.  Repeat decreased and improved.   Per JNC 8 guidelines at goal of less than 140/90 for age. Continue Norvasc 5 mg daily Continue Cozaar 100 mg daily Continue Bystolic 5 mg daily, Continue monitoring blood pressure at home Strict return precautions provided.  Orders: -     BD Pen Needle Mini U/F; Inject 1 each into the skin 2 (two) times daily. Or preferred choice of the patient  Dispense: 300 each; Refill: 3  Elevated CK Assessment & Plan: Chronic. Trending downward  Possible side effect of Arava Recommend she discuss this with Rheumatology Repeat CK level to ensure continues to downward trend.   Orders: -     CK  Stage 3b chronic kidney disease (HCC) -     Comprehensive metabolic panel  DM (diabetes mellitus), type 2 with peripheral vascular complications [facility]) Assessment & Plan: Chronic.  Stable.  Last A1c 8.9.  A1c 6.8 Continuing Trulicity 3 mg weekly Continue Lantus 30 units daily Continue Jardiance 10 mg daily.  Continue Duloxetine 30 mg daily Foot exam at next visit       Orders: -     Hemoglobin A1c  Encounter for PPD test Assessment & Plan: Negative reading on 08/16 by CMA Forms completed for Adult Day program  Orders: -     TB Skin Test  Rheumatoid arthritis of other site, unspecified whether rheumatoid factor present [facility]) Assessment & Plan: Likely contributing to multiple falls Currently on Leflunomide 20 mg daily Recent CK remains elevated.  Possible side effect of Arava.  Recommend she discuss this with her Rheumatologist     Mixed hyperlipidemia Assessment & Plan: Chronic. Cardiology reordered Crestor but due to possible interaction with Arava was discontinued Continue Zetia 10 mg daily Will defer management to Cardiology     PDMP reviewed  Return in about 3 days (around 02/03/2023) for RN  clinic.  Dana Allan, MD

## 2023-01-31 NOTE — Patient Outreach (Signed)
Care Coordination   Initial Visit Note   01/31/2023 Name: Natalie Watts MRN: [id number] DOB: 12-05-1945  Natalie Watts is a 77 y.o. year old female who sees Dana Allan, MD for primary care. I spoke with  [name] by phone today.  What matters to the patients health and wellness today?  Patient reports having recent fall and ED visit on 01/24/23.  Patient states she stumbled and fell.  She states she walks with a walker because she has balance issues.   Patient states she also has bilateral knee pain unrelieved by tylenol.  She reports the knee pain is a level 8 today.  She states her knees hurt even when touching or laying under sheet/ blankets.  Patient states she is scheduled to see her primary care provider this afternoon.   Patient states she lives with her daughter and has an aide 2 days per week from 9-1:00 pm.  Patient reports using a transportation service for her appointments.  She states her daughter is in the process of helping her apply for medicaid.  Patient states she is also working on applying for an adult day care. Patient states she has her medications through mail  order.  She states she doesn't care for the bubble packs because when she opens them sometimes the medications fall out or get lost.  Patient states her most recent order of medications came in bottles.      Goals Addressed             This Visit's Progress    management of falls and chronic health conditions       Interventions Today    Flowsheet Row Most Recent Value  Chronic Disease   Chronic disease during today's visit Other  [falls, bilateral knee pain]  General Interventions   General Interventions Discussed/Reviewed General Interventions Discussed, Doctor Visits  [evaluatioin of current treatment plan due to falls/ bilateral knee pain and patients adherence to plan as established by provider  Assessed knee pain level.]  Doctor Visits Discussed/Reviewed Doctor Visits Discussed  [reviewed upcoming  provider visits. Advised patient to talk with her doctor regarding ongoing bilateral knee pain unrelieved by OTC.]  Exercise Interventions   Exercise Discussed/Reviewed Physical Activity  [Assessed for physical activity]  Education Interventions   Education Provided Provided Education  Provided Verbal Education On Other  [confirmed patient has transportation to appointment.]  Mental Health Interventions   Mental Health Discussed/Reviewed Other, Mental Health Discussed  [Confirmed patient has received call from counseling service for appointment.]  Pharmacy Interventions   Pharmacy Dicussed/Reviewed Pharmacy Topics Discussed  [medications reviewed. Advised to take medications as prescribed. Suggested patient use pill box for medications due to her concerns about the bubble packs from pharmacy.  Advised patient to take medications with her to primary provider appointment today.]  Safety Interventions   Safety Discussed/Reviewed Fall Risk, Safety Discussed  [Reviewed fall precaution recommendations and sent patient education article in Mychart on fall precautions. Advised to use ambulatory device at all times. Assessed for home assistance.]              SDOH assessments and interventions completed:  Yes  SDOH Interventions Today    Flowsheet Row Most Recent Value  SDOH Interventions   Food Insecurity Interventions Intervention Not Indicated  Housing Interventions Intervention Not Indicated  Transportation Interventions Intervention Not Indicated        Care Coordination Interventions:  Yes, provided   Follow up plan: Follow up call scheduled for  03/02/23    Encounter Outcome:  Pt. Visit Completed   George Ina RN,BSN,CCM [facility] Care Coordination [phone] direct line

## 2023-02-01 ENCOUNTER — Other Ambulatory Visit: Payer: Medicare PPO

## 2023-02-01 ENCOUNTER — Telehealth: Payer: Self-pay | Admitting: *Deleted

## 2023-02-01 NOTE — Progress Notes (Signed)
  Care Coordination   Note   02/01/2023 Name: Natalie Watts MRN: [id number] DOB: Nov 27, 1945  Natalie Watts is a 77 y.o. year old female who sees Dana Allan, MD for primary care. I reached out to [name] by phone today to offer care coordination services.  [name] was given information about Care Coordination services today including:   The Care Coordination services include support from the care team which includes your Nurse Coordinator, Clinical Social Worker, or Pharmacist.  The Care Coordination team is here to help remove barriers to the health concerns and goals most important to you. Care Coordination services are voluntary, and the patient may decline or stop services at any time by request to their care team member.   Care Coordination Consent Status: Patient agreed to services and verbal consent obtained.   Follow up plan:  Telephone appointment with care coordination team member scheduled for:  02/03/2023  Encounter Outcome:  Pt. Scheduled from referral   Burman Nieves, [facility] Care Coordination Care Guide Direct Dial: [phone]

## 2023-02-01 NOTE — Progress Notes (Signed)
  Care Coordination  Outreach Note  02/01/2023 Name: Natalie Watts MRN: [id number] DOB: Dec 17, 1945   Care Coordination Outreach Attempts: An unsuccessful telephone outreach was attempted today to offer the patient information about available care coordination services.  Follow Up Plan:  Additional outreach attempts will be made to offer the patient care coordination information and services.   Encounter Outcome:  No Answer  Burman Nieves, CCMA Care Coordination Care Guide Direct Dial: [phone]

## 2023-02-03 ENCOUNTER — Ambulatory Visit: Payer: Medicare PPO

## 2023-02-03 ENCOUNTER — Ambulatory Visit: Payer: Self-pay | Admitting: *Deleted

## 2023-02-03 DIAGNOSIS — Z139 Encounter for screening, unspecified: Secondary | ICD-10-CM | POA: Insufficient documentation

## 2023-02-03 DIAGNOSIS — Z111 Encounter for screening for respiratory tuberculosis: Secondary | ICD-10-CM | POA: Insufficient documentation

## 2023-02-03 LAB — TB SKIN TEST: TB Skin Test: NEGATIVE

## 2023-02-03 NOTE — Patient Outreach (Signed)
  Care Coordination   Initial Visit Note   02/03/2023 Name: KADEDRA SCIARRA MRN: [id number] DOB: 08-11-1945  ARISA GAUNTT is a 77 y.o. year old female who sees Dana Allan, MD for primary care. I spoke with  [name] and her 2 daughters by phone today.  What matters to the patients health and wellness today?  Patient discussed being agreeable to participating in the activities at the [facility]. Patient's daughter's requesting assistance with custodial care options covered by insurance or a community resource ie [facility]. Patient also willing to apply for special assistance Medicaid for placement options in a assisted living. Medicaid application to be mailed to patient's home for completion.     Goals Addressed             This Visit's Progress    care coordination activities       Interventions Today    Flowsheet Row Most Recent Value  Chronic Disease   Chronic disease during today's visit Other  [falls, bilateral knee pain]  General Interventions   General Interventions Discussed/Reviewed General Interventions Discussed, General Interventions Reviewed, Community Resources, Doctor Visits, Level of Care  Doctor Visits Discussed/Reviewed Doctor Visits Discussed  [TB Test to be read today, pcp 10/10]  Level of Care Adult Daycare, Assisted Living, Personal Care Services  [Pt to start [facility] next week, following review of her TB test-need for custodial care discussed as well as applying for spec assist. medicaid for placement in a assistance living. medicaid application to be mailed to daughter's home]  Education Interventions   Education Provided Provided Education  [discussed special assistance medicaid application process for placement in assistant living]  Mental Health Interventions   Mental Health Discussed/Reviewed Mental Health Discussed  [patient has an appointment scheduled with a therapist for net week]  Safety  Interventions   Safety Discussed/Reviewed Safety Discussed              SDOH assessments and interventions completed:  Yes  SDOH Interventions Today    Flowsheet Row Most Recent Value  SDOH Interventions   Food Insecurity Interventions Intervention Not Indicated  Housing Interventions Intervention Not Indicated  Transportation Interventions Intervention Not Indicated  Utilities Interventions Intervention Not Indicated        Care Coordination Interventions:  Yes, provided   Follow up plan: Follow up call scheduled for 02/17/23    Encounter Outcome:  Pt. Visit Completed

## 2023-02-03 NOTE — Patient Instructions (Signed)
Visit Information  Thank you for taking time to visit with me today. Please don't hesitate to contact me if I can be of assistance to you.   Following are the goals we discussed today:  1.Medicaid application to be mailed to your home for special assistance processing 2. CSW to contact [facility] and insurance company regarding in home care support   Our next appointment is by telephone on 02/17/23 at 1pm  Please call the care guide team at [phone] if you need to cancel or reschedule your appointment.   If you are experiencing a Mental Health or Behavioral Health Crisis or need someone to talk to, please call 911   Patient verbalizes understanding of instructions and care plan provided today and agrees to view in MyChart. Active MyChart status and patient understanding of how to access instructions and care plan via MyChart confirmed with patient.     Telephone follow up appointment with care management team member scheduled for: 02/17/23  Verna Czech, LCSW Clinical Social Worker  [facility] Care Management [phone]

## 2023-02-03 NOTE — Progress Notes (Signed)
Pt presented today for a PPD read. Results are negative with no induration.

## 2023-02-06 ENCOUNTER — Encounter: Payer: Self-pay | Admitting: *Deleted

## 2023-02-06 ENCOUNTER — Telehealth: Payer: Self-pay

## 2023-02-06 NOTE — Patient Outreach (Signed)
  Care Coordination   Collaboration call  Visit Note   02/06/2023 Name: Natalie Watts MRN: [id number] DOB: 1945-11-20  Natalie Watts is a 77 y.o. year old female who sees Dana Allan, MD for primary care. I spoke with [facility] by phone today.  What matters to the patients health and wellness today?  In home care assistance and respite care.    Goals Addressed             This Visit's Progress    care coordination activities       Interventions Today    Flowsheet Row Most Recent Value  Chronic Disease   Chronic disease during today's visit Other  [falls, bilateral knee pain, need in in home care services]  General Interventions   General Interventions Discussed/Reviewed General Interventions Discussed, Communication with  [confirmed that [facility] no long handles the block grant for [employer] care services-only manages respite hours-Homecare providers confirms that they now manage the block  grant for in home care services]  Level of Care --  [referral received from [employer] [facility] for in home personal care services using the block grant funds, referral complete and returned-patient now on waiting list]              SDOH assessments and interventions completed:  No     Care Coordination Interventions:  Yes, provided   Follow up plan: Follow up call scheduled for 02/17/23    Encounter Outcome:  Pt. Visit Completed

## 2023-02-06 NOTE — Patient Outreach (Signed)
  Care Coordination   Collaboration  Visit Note   02/06/2023 Name: Natalie Watts MRN: [id number] DOB: Jul 14, 1945  Natalie Watts is a 77 y.o. year old female who sees Dana Allan, MD for primary care. I  spoke with [facility] regarding respite and in home care.  What matters to the patients health and wellness today?  Respite Care, in home care    Goals Addressed             This Visit's Progress    care coordination activities       Interventions Today    Flowsheet Row Most Recent Value  Chronic Disease   Chronic disease during today's visit Other  [falls, bilateral knee pain, need for in home care services]  General Interventions   General Interventions Discussed/Reviewed [facility], Communication with  [confirmed that [employer] [facility] no longer handles the block grant for personal care services-only manages respite hours-[facility] confirms that they now manage the block grant for in home care services]  Level of Care Personal Care Services  [referral form received from [employer] [facility] completed and returned-patient placed on waiting list]              SDOH assessments and interventions completed:  No     Care Coordination Interventions:  Yes, provided   Follow up plan: Follow up call scheduled for 02/17/23    Encounter Outcome:  Pt. Visit Completed

## 2023-02-06 NOTE — Addendum Note (Signed)
Addended by: Wenda Overland on: 02/06/2023 10:16 PM   Modules accepted: Orders, Level of Service

## 2023-02-06 NOTE — Progress Notes (Signed)
This encounter was created in error - please disregard.

## 2023-02-06 NOTE — Telephone Encounter (Signed)
Paper work for adult day program faxed to [phone].  Called and spoke with Natalie Watts to ensure that she received fax.  Called and let Natalie Watts, daughter know that forms were faxed and had been received.  Hardcopies are being mailed to [address] [place] [address].

## 2023-02-07 ENCOUNTER — Encounter: Payer: Self-pay | Admitting: *Deleted

## 2023-02-07 ENCOUNTER — Other Ambulatory Visit: Payer: Self-pay | Admitting: Pharmacist

## 2023-02-07 ENCOUNTER — Other Ambulatory Visit: Payer: Self-pay

## 2023-02-07 ENCOUNTER — Other Ambulatory Visit (HOSPITAL_COMMUNITY): Payer: Self-pay

## 2023-02-07 NOTE — Progress Notes (Signed)
Care Coordination Call  Patient called me. She had called [facility] and was cut off. Needs refill on Jardiance and Trulicity - provided this information to [facility] CPhT.   Catie Eppie Gibson, PharmD, BCACP, CPP Clinical Pharmacist [facility] [phone]

## 2023-02-08 NOTE — Progress Notes (Signed)
This encounter was created in error - please disregard.

## 2023-02-10 ENCOUNTER — Encounter: Payer: Self-pay | Admitting: Podiatry

## 2023-02-10 ENCOUNTER — Ambulatory Visit: Payer: Medicare PPO | Admitting: Podiatry

## 2023-02-10 DIAGNOSIS — M79675 Pain in left toe(s): Secondary | ICD-10-CM

## 2023-02-10 DIAGNOSIS — E0843 Diabetes mellitus due to underlying condition with diabetic autonomic (poly)neuropathy: Secondary | ICD-10-CM | POA: Diagnosis not present

## 2023-02-10 DIAGNOSIS — M79674 Pain in right toe(s): Secondary | ICD-10-CM | POA: Diagnosis not present

## 2023-02-10 DIAGNOSIS — B351 Tinea unguium: Secondary | ICD-10-CM

## 2023-02-10 DIAGNOSIS — I739 Peripheral vascular disease, unspecified: Secondary | ICD-10-CM | POA: Diagnosis not present

## 2023-02-10 NOTE — Progress Notes (Signed)
This patient returns to my office for at risk foot care.  This patient requires this care by a professional since this patient will be at risk due to having DM, Coagulation defect and kidney disease.  This patient is unable to cut nails herself since the patient cannot reach her nails.These nails are painful walking and wearing shoes.  This patient presents for at risk foot care today.  General Appearance  Alert, conversant and in no acute stress.  Vascular  Dorsalis pedis and posterior tibial  pulses are  weakly palpable  bilaterally.  Capillary return is within normal limits  bilaterally. Temperature is within normal limits  bilaterally.  Neurologic  Senn-Weinstein monofilament wire test within normal limits  bilaterally. Muscle power within normal limits bilaterally.  Nails Thick disfigured discolored nails with subungual debris  from hallux to fifth toes bilaterally. No evidence of bacterial infection or drainage bilaterally.  Orthopedic  No limitations of motion  feet .  No crepitus or effusions noted.  No bony pathology or digital deformities noted.  Skin  normotropic skin with no porokeratosis noted bilaterally.  No signs of infections or ulcers noted.     Onychomycosis  Pain in right toes  Pain in left toes  Consent was obtained for treatment procedures.   Mechanical debridement of nails 1-5  bilaterally performed with a nail nipper.  Filed with dremel without incident.    Return office visit    4 months                  Told patient to return for periodic foot care and evaluation due to potential at risk complications.   Gardiner Barefoot DPM

## 2023-02-11 ENCOUNTER — Telehealth: Payer: Self-pay | Admitting: *Deleted

## 2023-02-13 ENCOUNTER — Encounter: Payer: Self-pay | Admitting: Family Medicine

## 2023-02-13 ENCOUNTER — Telehealth: Payer: Self-pay | Admitting: *Deleted

## 2023-02-13 NOTE — Patient Instructions (Signed)
Visit Information  Thank you for taking time to visit with me today. Please don't hesitate to contact me if I can be of assistance to you.   Following are the goals we discussed today:  Please continue active participation in the [facility] CSW has completed the referral to the [facility] program (in home care program) through [facility] Please review Medicaid application mailed to you for long term care consideration   Our next appointment is by telephone on 02/13/23 Please call the care guide team at [phone] if you need to cancel or reschedule your appointment.   If you are experiencing a Mental Health or Behavioral Health Crisis or need someone to talk to, please call 911   Patient verbalizes understanding of instructions and care plan provided today and agrees to view in MyChart. Active MyChart status and patient understanding of how to access instructions and care plan via MyChart confirmed with patient.     Telephone follow up appointment with care management team member scheduled for: 02/13/23   Verna Czech, LCSW Clinical Social Worker  [phone]

## 2023-02-13 NOTE — Patient Outreach (Signed)
  Care Coordination   Follow Up Visit Note   02/13/2023 Late Entry Name: Natalie Watts MRN: [id number] DOB: [birth year]  Natalie Watts is a 77 y.o. year old female who sees Dana Allan, MD for primary care. I spoke with  Natalie Watts's daughter Natalie Watts by phone on 02/10/23.  What matters to the patients health and wellness today? Community resources for in home care and Adult Day Programs as well as options for long term care    Goals Addressed             This Visit's Progress    care coordination activities       Interventions Today    Flowsheet Row Most Recent Value  Chronic Disease   Chronic disease during today's visit Other  [falls, bilateral knee pain, need for in home services]  General Interventions   General Interventions Discussed/Reviewed General Interventions Reviewed, Community Resources, Level of Care  Doctor Visits Discussed/Reviewed Doctor Visits Discussed  Level of Care Adult Daycare, Personal Care Services, Applications  [patient's daughter confirmed that patient is now attending the [facility]. Per pt's daughter, patient is enjoying the program. Referral to the [facility] program confirmed, patient now on waiting list.]  Applications Medicaid  [mediciad application for long term care mailed to patient's daughter Valerie-will review once received]  Education Interventions   Applications Medicaid  [mediciad application for long term care mailed to patient's daughter Valerie-will review once received]              SDOH assessments and interventions completed:  No     Care Coordination Interventions:  Yes, provided   Follow up plan: Follow up call scheduled for 02/13/23    Encounter Outcome:  Pt. Visit Completed

## 2023-02-14 ENCOUNTER — Ambulatory Visit: Payer: Medicare PPO | Admitting: Clinical

## 2023-02-14 NOTE — Patient Instructions (Signed)
Visit Information  Thank you for taking time to visit with me today. Please don't hesitate to contact me if I can be of assistance to you.   Following are the goals we discussed today:  Please complete the signature page on the Medicaid application once receive and return to this social worker Please continue to follow up with [facility] regarding patient's transportation needs   Our next appointment is by telephone on 02/17/23 at 1pm  Please call the care guide team at [phone] if you need to cancel or reschedule your appointment.   If you are experiencing a Mental Health or Behavioral Health Crisis or need someone to talk to, please call 911   Patient verbalizes understanding of instructions and care plan provided today and agrees to view in MyChart. Active MyChart status and patient understanding of how to access instructions and care plan via MyChart confirmed with patient.     Telephone follow up appointment with care management team member scheduled for:  02/17/23  Verna Czech, [place] Clinical Social Worker  [phone]

## 2023-02-14 NOTE — Patient Outreach (Signed)
  Care Coordination   Follow Up Visit Note   02/14/2023 late entry Name: Natalie Watts MRN: [id number] DOB: [birth year]  Natalie Watts is a 77 y.o. year old female who sees Natalie Allan, MD for primary care. I spoke with  Natalie Watts's daughter Natalie Watts  by phone on 02/13/23.  What matters to the patients health and wellness today?  Adult Day Care and long term care options. Patient currently active with [facility], will reside with daughter temporarily while long term care options are being identified.   Goals Addressed             This Visit's Progress    care coordination activities       Interventions Today    Flowsheet Row Most Recent Value  Chronic Disease   Chronic disease during today's visit Other  [falls, bilateral knee pain, need for in home care services]  General Interventions   General Interventions Discussed/Reviewed General Interventions Reviewed, [facility], Level of Care  [Patient now residing temporarily with daughter Natalie Watts, patient's daughter would like to consider long term care options]  Level of Care Adult Daycare, Applications  [patient continues to participate in the [facility]-currently working out transportation daily to and from]  [employer]  [Medicaid application for long term care completed by phone and securely faxed to patient's daughter for signature-will mail completed application to the Department of Social Services]  Education Interventions   Education Provided Provided Education  [continue to provide education on long term care facility process]  Applications Medicaid  [Medicaid application for long term care completed by phone and securely faxed to patient's daughter for signature-will mail completed application to the Department of Social Services]  Safety Interventions   Safety Discussed/Reviewed Safety Discussed              SDOH assessments and interventions completed:   No     Care Coordination Interventions:  Yes, provided   Follow up plan: Follow up call scheduled for 02/17/23    Encounter Outcome:  Pt. Visit Completed

## 2023-02-17 ENCOUNTER — Ambulatory Visit: Payer: Self-pay | Admitting: *Deleted

## 2023-02-17 NOTE — Patient Outreach (Signed)
  Care Coordination   Follow Up Visit Note   02/17/2023 Name: Natalie Watts MRN: [id number] DOB: 1945-06-28  Natalie Watts is a 77 y.o. year old female who sees Dana Allan, MD for primary care. I spoke with  [name] by phone today.  What matters to the patients health and wellness today?  ***    Goals Addressed             This Visit's Progress    care coordination activities       Interventions Today    Flowsheet Row Most Recent Value  Chronic Disease   Chronic disease during today's visit Other  [falls, bilateral knee pain, need for in home care services]  General Interventions   General Interventions Discussed/Reviewed General Interventions Reviewed, [facility], Level of Care  Level of Care Assisted Living  [place] with the [facility]-"I like it so far" reinforced continued participation for socialization and cognitive support-continues to consider assisted living placement-options and benefits discussed-will provide ALF list to daughter]  Applications FL-2  [FL-2 to be requested]  Education Interventions   Education Provided Provided Education  [discussed process for assisted living placement and need for Medicaid]  Applications FL-2  [FL-2 to be requested]  Mental Health Interventions   Mental Health Discussed/Reviewed Mental Health Reviewed  [counseling appointment to be re-scheduled through [facility]-]              SDOH assessments and interventions completed:  No{THN Tip this will not be part of the note when signed-REQUIRED REPORT FIELD DO NOT DELETE (Optional):27901}     Care Coordination Interventions:  Yes, provided {THN Tip this will not be part of the note when signed-REQUIRED REPORT FIELD DO NOT DELETE (Optional):27901}  Follow up plan: Follow up call scheduled for 03/03/23    Encounter Outcome:  Pt. Visit Completed {THN Tip this will not be part of the note when signed-REQUIRED REPORT FIELD DO NOT DELETE  (Optional):27901}

## 2023-02-18 ENCOUNTER — Encounter: Payer: Self-pay | Admitting: Family Medicine

## 2023-02-18 NOTE — Assessment & Plan Note (Signed)
Chronic. Cardiology reordered Crestor but due to possible interaction with Arava was discontinued Continue Zetia 10 mg daily Will defer management to Cardiology

## 2023-02-18 NOTE — Assessment & Plan Note (Signed)
Likely contributing to multiple falls Currently on Leflunomide 20 mg daily Recent CK remains elevated.  Possible side effect of Arava.  Recommend she discuss this with her Rheumatologist

## 2023-02-18 NOTE — Assessment & Plan Note (Addendum)
Chronic.  Initially elevated.  Repeat decreased and improved.   Per JNC 8 guidelines at goal of less than 140/90 for age. Continue Norvasc 5 mg daily Continue Cozaar 100 mg daily Continue Bystolic 5 mg daily, Continue monitoring blood pressure at home Strict return precautions provided.

## 2023-02-18 NOTE — Patient Instructions (Signed)
Visit Information  Thank you for taking time to visit with me today. Please don't hesitate to contact me if I can be of assistance to you.   Following are the goals we discussed today:  Please continue active participation in the [facility] Patient's daughter to follow up with the Department of Social Services regarding the status of special assistance medicaid CSW to request Fl2 to begin placement search   Our next appointment is by telephone on 03/03/23 at 4pm  Please call the care guide team at [phone] if you need to cancel or reschedule your appointment.   If you are experiencing a Mental Health or Behavioral Health Crisis or need someone to talk to, please call 911   Patient verbalizes understanding of instructions and care plan provided today and agrees to view in MyChart. Active MyChart status and patient understanding of how to access instructions and care plan via MyChart confirmed with patient.     Telephone follow up appointment with care management team member scheduled for: 03/03/23  Verna Czech, LCSW Clinical Social Worker  [phone]

## 2023-02-18 NOTE — Assessment & Plan Note (Signed)
Chronic.  Stable.  Last A1c 8.9.  A1c 6.8 Continuing Trulicity 3 mg weekly Continue Lantus 30 units daily Continue Jardiance 10 mg daily.  Continue Duloxetine 30 mg daily Foot exam at next visit

## 2023-02-18 NOTE — Assessment & Plan Note (Addendum)
Chronic. Trending downward  Possible side effect of Arava Recommend she discuss this with Rheumatology Repeat CK level to ensure continues to downward trend.

## 2023-02-18 NOTE — Assessment & Plan Note (Signed)
Negative reading on 08/16 by CMA Forms completed for Adult Day program

## 2023-02-18 NOTE — Assessment & Plan Note (Signed)
Referral placed to [facility]  Given history of multiple falls requires assistance with ADLs, gait, balance and strengthening. PPD today, return 08/16 for reading. Forms to be completed for Adult Day program after results of PPD

## 2023-02-28 ENCOUNTER — Encounter: Payer: Self-pay | Admitting: Family Medicine

## 2023-02-28 ENCOUNTER — Other Ambulatory Visit (HOSPITAL_COMMUNITY): Payer: Self-pay

## 2023-02-28 ENCOUNTER — Other Ambulatory Visit: Payer: Self-pay | Admitting: Family Medicine

## 2023-03-01 ENCOUNTER — Other Ambulatory Visit (HOSPITAL_COMMUNITY): Payer: Self-pay

## 2023-03-01 ENCOUNTER — Other Ambulatory Visit: Payer: Self-pay

## 2023-03-01 MED ORDER — LEFLUNOMIDE 20 MG PO TABS
20.0000 mg | ORAL_TABLET | Freq: Every day | ORAL | 0 refills | Status: AC
  Filled 2023-03-01 – 2023-04-04 (×2): qty 30, 30d supply, fill #0

## 2023-03-02 ENCOUNTER — Other Ambulatory Visit (HOSPITAL_COMMUNITY): Payer: Self-pay

## 2023-03-02 ENCOUNTER — Telehealth: Payer: Self-pay

## 2023-03-02 ENCOUNTER — Ambulatory Visit: Payer: Self-pay

## 2023-03-02 ENCOUNTER — Other Ambulatory Visit: Payer: Self-pay

## 2023-03-02 MED ORDER — VITAMIN D3 50 MCG (2000 UT) PO CAPS
2000.0000 [IU] | ORAL_CAPSULE | Freq: Every day | ORAL | 1 refills | Status: DC
  Filled 2023-03-02: qty 90, 90d supply, fill #0
  Filled 2023-05-19 – 2023-05-23 (×2): qty 30, 30d supply, fill #1
  Filled 2023-07-05 – 2023-07-06 (×2): qty 30, 30d supply, fill #2
  Filled 2023-07-28 – 2023-08-01 (×2): qty 30, 30d supply, fill #3

## 2023-03-02 MED ORDER — TRULICITY 3 MG/0.5ML ~~LOC~~ SOAJ
3.0000 mg | SUBCUTANEOUS | 2 refills | Status: AC
  Filled 2023-03-02: qty 2, 28d supply, fill #0
  Filled 2023-04-01: qty 2, 28d supply, fill #1
  Filled 2023-05-31 (×2): qty 2, 28d supply, fill #2

## 2023-03-02 MED ORDER — ACETAMINOPHEN 500 MG PO TABS
1000.0000 mg | ORAL_TABLET | Freq: Two times a day (BID) | ORAL | 1 refills | Status: AC | PRN
  Filled 2023-03-02: qty 90, 23d supply, fill #0

## 2023-03-02 NOTE — Patient Outreach (Signed)
  Care Coordination   03/02/2023 Name: Natalie Watts MRN: [id number] DOB: 1945-12-16   Care Coordination Outreach Attempts:  Successful contact made with patient.  Patient states she is attending adult day care today.  Request call on another day at 4 pm.   Follow Up Plan:  New appointment date scheduled with patient.   Encounter Outcome:  Patient Request to Call Back   Care Coordination Interventions:  No, not indicated    George Ina [facility] Care Coordination [phone] direct line

## 2023-03-02 NOTE — Patient Outreach (Signed)
  Care Coordination   03/02/2023 Name: AKEILA YAROSH MRN: [id number] DOB: 1946-01-10   Care Coordination Outreach Attempts:  An unsuccessful telephone outreach was attempted for a scheduled appointment today. HIPAA compliant message left with call back phone number.   Follow Up Plan:  Additional outreach attempts will be made to offer the patient care coordination information and services.   Encounter Outcome:  No Answer   Care Coordination Interventions:  No, not indicated    George Ina [facility] Care Coordination [phone] direct line

## 2023-03-03 ENCOUNTER — Ambulatory Visit: Payer: Self-pay | Admitting: *Deleted

## 2023-03-03 ENCOUNTER — Other Ambulatory Visit: Payer: Self-pay

## 2023-03-04 NOTE — Patient Instructions (Signed)
Visit Information  Thank you for taking time to visit with me today. Please don't hesitate to contact me if I can be of assistance to you.   Following are the goals we discussed today:  Patient's daughter to follow up on status of Medicaid application for placement purposes CSW to provide patient with a list of additional Independent Living facilities for possible consideration Patient to re-schedule mental health appointment with [facility]   Our next appointment is by telephone on 03/16/23 at 4pm  Please call the care guide team at [phone] if you need to cancel or reschedule your appointment.   If you are experiencing a Mental Health or Behavioral Health Crisis or need someone to talk to, please call 911   Patient verbalizes understanding of instructions and care plan provided today and agrees to view in MyChart. Active MyChart status and patient understanding of how to access instructions and care plan via MyChart confirmed with patient.     Telephone follow up appointment with care management team member scheduled for: 03/16/23   Verna Czech, LCSW [place]  [facility], [facility] Licensed Clinical Social Worker Care Coordinator  Direct Dial: [phone]

## 2023-03-04 NOTE — Patient Outreach (Addendum)
Care Coordination   Follow Up Visit Note   03/04/2023 Name: SHANEEKA MANKINS MRN: [id number] DOB: 1945-08-23  AMAIRAH KOELLNER is a 77 y.o. year old female who sees Dana Allan, MD for primary care. I spoke with  [name] by phone on 03/03/23.  What matters to the patients health and wellness today?  Patient remains active in the [facility], however would like continued efforts for Assisted Living Placement or Independent Living. Patient will continue to reside with daughter until placement options are identified.    Goals Addressed             This Visit's Progress    care coordination activities       Interventions Today    Flowsheet Row Most Recent Value  Chronic Disease   Chronic disease during today's visit Other  [falls, bilateral knee pain, need for in home and out of home services]  General Interventions   General Interventions Discussed/Reviewed General Interventions Discussed, Community Resources, Level of Care  [confirmed that pt remains active in the [facility]- working on community transp to the program daily, daughter to provide transport-.confirmed interest in Independent or ALF placement-currently on waiting list for [facility]]  Doctor Visits Discussed/Reviewed Doctor Visits Discussed, PCP  [10/10 PCP]  Level of Care Applications, Adult Daycare  [Pt's daughter has submitted Medicaid application for ALF placement, determination pending. reinforced participation in the Adult Day Program for socialization and cognitive support]  Applications Medicaid, FL-2  Education Interventions   Education Provided Provided Education  Provided Verbal Education On [facility]  [patient educated on [employer] process, bed search to be initiciated once [employer] eligibility has been determined]  [employer], FL-2  Mental Health Interventions   Mental Health Discussed/Reviewed Mental Health Reviewed  [reinforced  need to re-schedule initial mental health appointment with [facility]]              SDOH assessments and interventions completed:  No     Care Coordination Interventions:  Yes, provided   Follow up plan: Follow up call scheduled for 03/16/23    Encounter Outcome:  Patient Visit Completed

## 2023-03-06 NOTE — Telephone Encounter (Signed)
Left message to return call to our office.  

## 2023-03-07 ENCOUNTER — Ambulatory Visit: Payer: Self-pay

## 2023-03-07 NOTE — Patient Outreach (Signed)
Care Coordination   Follow Up Visit Note   03/07/2023 Name: SABRI BRIDWELL MRN: [id number] DOB: 04/29/1946  LORNA NEVIN is a 77 y.o. year old female who sees Dana Allan, MD for primary care. I spoke with  [name] by phone today.  What matters to the patients health and wellness today?  Patient reports ongoing bilateral knee pain.  She states pain level today is an 8 with minimal effects from tylenol.  Patient denies any recent falls. She states she [facility].  Patient states she and her daughter are still working on assisted living placement / independent living.    Goals Addressed             This Visit's Progress    management of falls and chronic health conditions       Interventions Today    Flowsheet Row Most Recent Value  Chronic Disease   Chronic disease during today's visit Other  [falls, bilateral knee pain]  General Interventions   General Interventions Discussed/Reviewed General Interventions Reviewed, Doctor Visits  [evaluation of current treatment plan for falls and bilateral knee pain and patients adherence to plan as established by provider. Assessed pain level and assessed for recent falls.]  Doctor Visits Discussed/Reviewed Doctor Visits Reviewed  Annabell Sabal upcoming provider visits. Advised to call and reschedule counseling appointment. Advised to talk with primary provider regarding orthopedic referral.]  Exercise Interventions   Exercise Discussed/Reviewed Physical Activity  Education Interventions   Education Provided Provided Education  [Advised to take pain medication as recommend.  Advised to use ice/ heat to knees for pain management.]  Pharmacy Interventions   Pharmacy Dicussed/Reviewed Pharmacy Topics Reviewed  [medications reviewed and compliance discussed.]  Safety Interventions   Safety Discussed/Reviewed Safety Reviewed, Fall Risk  [Advised to use ambulatory device as recommended to assist  with balance and safety.  Advised to wear appropriate footwear with low heels and non-slip soles, Advised to remove household hazards: loose rugs, improve lighting and install grab bars.]              SDOH assessments and interventions completed:  No     Care Coordination Interventions:  Yes, provided   Follow up plan: Follow up call scheduled for 04/13/23    Encounter Outcome:  Patient Visit Completed   George Ina RN,BSN,CCM [facility] Care Coordination [phone] direct line

## 2023-03-07 NOTE — Patient Instructions (Signed)
Visit Information  Thank you for taking time to visit with me today. Please don't hesitate to contact me if I can be of assistance to you.   Following are the goals we discussed today:   Goals Addressed             This Visit's Progress    management of falls and chronic health conditions       Interventions Today    Flowsheet Row Most Recent Value  Chronic Disease   Chronic disease during today's visit Other  [falls, bilateral knee pain]  General Interventions   General Interventions Discussed/Reviewed General Interventions Reviewed, Doctor Visits  [evaluation of current treatment plan for falls and bilateral knee pain and patients adherence to plan as established by provider. Assessed pain level and assessed for recent falls.]  Doctor Visits Discussed/Reviewed Doctor Visits Reviewed  Annabell Sabal upcoming provider visits. Advised to call and reschedule counseling appointment. Advised to talk with primary provider regarding orthopedic referral.]  Exercise Interventions   Exercise Discussed/Reviewed Physical Activity  Education Interventions   Education Provided Provided Education  [Advised to take pain medication as recommend.  Advised to use ice/ heat to knees for pain management.]  Pharmacy Interventions   Pharmacy Dicussed/Reviewed Pharmacy Topics Reviewed  [medications reviewed and compliance discussed.]  Safety Interventions   Safety Discussed/Reviewed Safety Reviewed, Fall Risk  [Advised to use ambulatory device as recommended to assist with balance and safety.  Advised to wear appropriate footwear with low heels and non-slip soles, Advised to remove household hazards: loose rugs, improve lighting and install grab bars.]              Our next appointment is by telephone on 10.24.24 at 4 pm  Please call the care guide team at [phone] if you need to cancel or reschedule your appointment.   If you are experiencing a Mental Health or Behavioral Health Crisis or need  someone to talk to, please call the Suicide and Crisis Lifeline: 988 call 1-800-273-TALK (toll free, 24 hour hotline)  Patient verbalizes understanding of instructions and care plan provided today and agrees to view in MyChart. Active MyChart status and patient understanding of how to access instructions and care plan via MyChart confirmed with patient.     George Ina RN,BSN,CCM [facility] Care Coordination [phone] direct line

## 2023-03-16 ENCOUNTER — Telehealth: Payer: Self-pay | Admitting: *Deleted

## 2023-03-16 ENCOUNTER — Encounter: Payer: Self-pay | Admitting: *Deleted

## 2023-03-16 NOTE — Patient Outreach (Signed)
Care Coordination   03/16/2023 Name: Natalie Watts MRN: [id number] DOB: [birth year]   Care Coordination Outreach Attempts:  An unsuccessful telephone outreach was attempted today to offer the patient information about available care coordination services.  Follow Up Plan:  Additional outreach attempts will be made to offer the patient care coordination information and services.   Encounter Outcome:  No Answer   Care Coordination Interventions:  No, not indicated    Charnika Herbst, LCSW [facility], [facility] Licensed Clinical Social Worker Care Coordinator  Direct Dial: [phone]

## 2023-03-30 ENCOUNTER — Ambulatory Visit: Payer: Medicare PPO | Admitting: Family Medicine

## 2023-04-01 ENCOUNTER — Other Ambulatory Visit: Payer: Self-pay | Admitting: Family Medicine

## 2023-04-01 ENCOUNTER — Other Ambulatory Visit (HOSPITAL_COMMUNITY): Payer: Self-pay

## 2023-04-01 DIAGNOSIS — E785 Hyperlipidemia, unspecified: Secondary | ICD-10-CM

## 2023-04-01 DIAGNOSIS — G8929 Other chronic pain: Secondary | ICD-10-CM

## 2023-04-01 DIAGNOSIS — E1159 Type 2 diabetes mellitus with other circulatory complications: Secondary | ICD-10-CM

## 2023-04-01 DIAGNOSIS — G629 Polyneuropathy, unspecified: Secondary | ICD-10-CM

## 2023-04-03 ENCOUNTER — Other Ambulatory Visit (HOSPITAL_COMMUNITY): Payer: Self-pay

## 2023-04-03 MED ORDER — CLOPIDOGREL BISULFATE 75 MG PO TABS
75.0000 mg | ORAL_TABLET | Freq: Every day | ORAL | 1 refills | Status: AC
  Filled 2023-04-03 (×2): qty 30, 30d supply, fill #0
  Filled 2023-04-26: qty 30, 30d supply, fill #1
  Filled 2023-05-17 – 2023-05-23 (×3): qty 30, 30d supply, fill #2
  Filled 2023-07-05 – 2023-07-06 (×2): qty 30, 30d supply, fill #3
  Filled 2023-07-28 – 2023-08-01 (×2): qty 30, 30d supply, fill #4
  Filled 2023-08-21 – 2023-09-21 (×6): qty 30, 30d supply, fill #5

## 2023-04-03 MED ORDER — AMLODIPINE BESYLATE 5 MG PO TABS
5.0000 mg | ORAL_TABLET | Freq: Every day | ORAL | 1 refills | Status: AC
Start: 2023-04-03 — End: ?
  Filled 2023-04-03: qty 30, 30d supply, fill #0
  Filled 2023-04-26: qty 30, 30d supply, fill #1
  Filled 2023-05-17 – 2023-05-23 (×3): qty 30, 30d supply, fill #2
  Filled 2023-07-05 – 2023-07-06 (×2): qty 30, 30d supply, fill #3
  Filled 2023-07-28 – 2023-08-01 (×2): qty 30, 30d supply, fill #4
  Filled 2023-08-21 – 2023-09-21 (×6): qty 30, 30d supply, fill #5

## 2023-04-03 MED ORDER — LOSARTAN POTASSIUM 100 MG PO TABS
100.0000 mg | ORAL_TABLET | Freq: Every day | ORAL | 1 refills | Status: AC
  Filled 2023-04-03: qty 30, 30d supply, fill #0
  Filled 2023-04-26: qty 30, 30d supply, fill #1
  Filled 2023-05-17 – 2023-05-23 (×3): qty 30, 30d supply, fill #2
  Filled 2023-07-05 – 2023-07-06 (×2): qty 30, 30d supply, fill #3
  Filled 2023-07-28 – 2023-08-01 (×2): qty 30, 30d supply, fill #4
  Filled 2023-08-21 – 2023-09-21 (×6): qty 30, 30d supply, fill #5

## 2023-04-03 MED ORDER — DULOXETINE HCL 30 MG PO CPEP
30.0000 mg | ORAL_CAPSULE | Freq: Every day | ORAL | 1 refills | Status: AC
  Filled 2023-04-03: qty 30, 30d supply, fill #0
  Filled 2023-04-26: qty 30, 30d supply, fill #1
  Filled 2023-05-17 – 2023-05-23 (×3): qty 30, 30d supply, fill #2
  Filled 2023-07-05 – 2023-07-06 (×2): qty 30, 30d supply, fill #3
  Filled 2023-07-28 – 2023-08-01 (×2): qty 30, 30d supply, fill #4
  Filled 2023-08-21 – 2023-09-21 (×6): qty 30, 30d supply, fill #5

## 2023-04-03 MED ORDER — NEBIVOLOL HCL 5 MG PO TABS
5.0000 mg | ORAL_TABLET | Freq: Every day | ORAL | 1 refills | Status: AC
  Filled 2023-04-03: qty 30, 30d supply, fill #0
  Filled 2023-04-26: qty 30, 30d supply, fill #1
  Filled 2023-05-17 – 2023-05-23 (×3): qty 30, 30d supply, fill #2
  Filled 2023-07-05 – 2023-07-06 (×2): qty 30, 30d supply, fill #3
  Filled 2023-07-28 – 2023-08-01 (×2): qty 30, 30d supply, fill #4
  Filled 2023-08-21 – 2023-09-21 (×6): qty 30, 30d supply, fill #5

## 2023-04-03 MED ORDER — EZETIMIBE 10 MG PO TABS
10.0000 mg | ORAL_TABLET | Freq: Every day | ORAL | 1 refills | Status: AC
Start: 2023-04-03 — End: ?
  Filled 2023-04-03: qty 30, 30d supply, fill #0
  Filled 2023-04-26: qty 30, 30d supply, fill #1
  Filled 2023-05-17 – 2023-05-23 (×3): qty 30, 30d supply, fill #2
  Filled 2023-07-05 – 2023-07-06 (×2): qty 30, 30d supply, fill #3
  Filled 2023-07-28 – 2023-08-01 (×2): qty 30, 30d supply, fill #4
  Filled 2023-08-21 – 2023-09-21 (×6): qty 30, 30d supply, fill #5

## 2023-04-04 ENCOUNTER — Other Ambulatory Visit (HOSPITAL_COMMUNITY): Payer: Self-pay

## 2023-04-04 ENCOUNTER — Encounter: Payer: Self-pay | Admitting: Family Medicine

## 2023-04-04 ENCOUNTER — Other Ambulatory Visit: Payer: Self-pay

## 2023-04-05 NOTE — Telephone Encounter (Signed)
Pt requesting Valacyclovir not on current meds anymore. Medication  is pended

## 2023-04-06 ENCOUNTER — Other Ambulatory Visit (HOSPITAL_COMMUNITY): Payer: Self-pay

## 2023-04-06 ENCOUNTER — Other Ambulatory Visit: Payer: Self-pay

## 2023-04-06 ENCOUNTER — Ambulatory Visit: Payer: Medicare PPO | Admitting: Family Medicine

## 2023-04-06 ENCOUNTER — Encounter: Payer: Self-pay | Admitting: Family Medicine

## 2023-04-06 VITALS — BP 146/78 | HR 80 | Temp 98.0°F | Resp 16 | Ht 63.0 in | Wt 131.1 lb

## 2023-04-06 DIAGNOSIS — E1159 Type 2 diabetes mellitus with other circulatory complications: Secondary | ICD-10-CM

## 2023-04-06 DIAGNOSIS — R809 Proteinuria, unspecified: Secondary | ICD-10-CM

## 2023-04-06 DIAGNOSIS — Z23 Encounter for immunization: Secondary | ICD-10-CM | POA: Diagnosis not present

## 2023-04-06 DIAGNOSIS — I152 Hypertension secondary to endocrine disorders: Secondary | ICD-10-CM | POA: Diagnosis not present

## 2023-04-06 DIAGNOSIS — B009 Herpesviral infection, unspecified: Secondary | ICD-10-CM

## 2023-04-06 MED ORDER — VALACYCLOVIR HCL 1 G PO TABS
1000.0000 mg | ORAL_TABLET | Freq: Three times a day (TID) | ORAL | 11 refills | Status: AC
  Filled 2023-04-06 (×2): qty 30, 10d supply, fill #0
  Filled 2023-05-13: qty 30, 10d supply, fill #1
  Filled 2023-05-15: qty 30, 10d supply, fill #0
  Filled 2023-05-15: qty 30, 10d supply, fill #1

## 2023-04-06 NOTE — Telephone Encounter (Signed)
NOTED

## 2023-04-06 NOTE — Patient Instructions (Signed)
It was a pleasure meeting you today. Thank you for allowing me to take part in your health care.  Our goals for today as we discussed include:  Start Valtrex 1000 mg three times a day for 10 days. I have sent in for extra refill for future outbreaks  Last urine had protein.  Will recheck today  Flu vaccine today  Follow up as needed   If you have any questions or concerns, please do not hesitate to call the office at [phone].  I look forward to our next visit and until then take care and stay safe.  Regards,   Dana Allan, MD   [facility]

## 2023-04-06 NOTE — Progress Notes (Signed)
SUBJECTIVE:   Chief Complaint  Patient presents with   Medication Refill    Valacyclovir Outbreak started 2 days ago   HPI Presents for acute visit  Discussed the use of AI scribe software for clinical note transcription with the patient, who gave verbal consent to proceed.  History of Present Illness The patient, with a history of recurrent shingles, presents with a new rash on her back. The rash started a few days ago, initially presenting as a 'funny feeling' without visible changes. A day or two later, the patient noticed two reddish bumps in the same area. The patient denies any associated fever. The rash was not painful or itchy initially, but has become mildly uncomfortable over the past day. The patient reports that the rash appears to have grown slightly, with possibly three bumps now present.  The patient also mentions a recent increase in stress, which may have triggered the shingles reactivation. She has been attending an adult daycare, where phone use is limited, which has caused some frustration in trying to contact the clinic.  In addition to the rash, the patient also reports an increase in urinary frequency, particularly at night, but denies any pain or blood in the urine. She recalls a previous doctor mentioning an abnormality in her urine, but no further details or symptoms were provided.  The patient also requested a flu shot during this visit.    PERTINENT PMH / PSH: As above  OBJECTIVE:  BP (!) 146/78   Pulse 80   Temp 98 F (36.7 C)   Resp 16   Ht 5\' 3"  (1.6 m)   Wt 131 lb 2 oz (59.5 kg)   SpO2 97%   BMI 23.23 kg/m    Physical Exam Vitals reviewed.  Constitutional:      General: She is not in acute distress.    Appearance: She is not ill-appearing.  HENT:     Mouth/Throat:     Mouth: Mucous membranes are moist.  Cardiovascular:     Rate and Rhythm: Normal rate and regular rhythm.     Pulses: Normal pulses.     Heart sounds: Normal heart  sounds.  Pulmonary:     Effort: Pulmonary effort is normal.     Breath sounds: Normal breath sounds.  Skin:    Findings: Rash present. Rash is vesicular (exudative fluid filled lesions).       Neurological:     Mental Status: She is alert. Mental status is at baseline.        04/06/2023    9:40 AM 01/31/2023    2:59 PM 12/28/2022    1:46 PM 12/13/2022    9:05 AM 09/28/2022    8:27 AM  Depression screen PHQ 2/9  Decreased Interest 1 1 1 1  0  Down, Depressed, Hopeless 1 0 1 0 0  PHQ - 2 Score 2 1 2 1  0  Altered sleeping 1 1 1  0   Tired, decreased energy 1 2 2 1    Change in appetite 0 2 2 1    Feeling bad or failure about yourself  1  1 0   Trouble concentrating 0 0 1 0   Moving slowly or fidgety/restless 0 2 0 0   Suicidal thoughts 0 0 0 0   PHQ-9 Score 5 8 9 3    Difficult doing work/chores Very difficult Somewhat difficult Somewhat difficult        04/06/2023    9:40 AM 01/31/2023    2:59 PM 12/28/2022  1:47 PM 12/13/2022    9:06 AM  GAD 7 : Generalized Anxiety Score  Nervous, Anxious, on Edge 0 1 1 0  Control/stop worrying 0 2 2 0  Worry too much - different things 1 1 2  0  Trouble relaxing 0 1 1 1   Restless 0 1 1 0  Easily annoyed or irritable 1 2 1  0  Afraid - awful might happen 1 0 2 0  Total GAD 7 Score 3 8 10 1   Anxiety Difficulty Very difficult Somewhat difficult Somewhat difficult Somewhat difficult    ASSESSMENT/PLAN:  Herpes simplex type 2 infection Assessment & Plan: Reactivation of rash on the back, similar to previous episodes. No systemic symptoms. -Prescribe antiviral medication with additional refills for future outbreaks. -Advise patient to start medication at the first sign of future outbreaks.  Orders: -     valACYclovir HCl; Take 1 tablet (1,000 mg total) by mouth 3 (three) times daily.  Dispense: 30 tablet; Refill: 11  Need for influenza vaccination -     Flu Vaccine Trivalent High Dose (Fluad)  Proteinuria, unspecified type -      Protein / creatinine ratio, urine  Hypertension associated with diabetes (HCC) Assessment & Plan: Elevated blood pressure noted during visit, possibly due to anxiety. Patient reports normal readings at home. -Continue current management and monitor blood pressure at home. If remains elevated at next visit plan to increase antihypertensive. -Check urine for increased protein     General Health Maintenance -Administer influenza vaccine today.   PDMP reviewed  Return for PCP.  Dana Allan, MD

## 2023-04-07 LAB — PROTEIN / CREATININE RATIO, URINE
Creatinine, Urine: 39 mg/dL (ref 20–275)
Protein/Creat Ratio: 410 mg/g{creat} — ABNORMAL HIGH (ref 24–184)
Protein/Creatinine Ratio: 0.41 mg/mg{creat} — ABNORMAL HIGH (ref 0.024–0.184)
Total Protein, Urine: 16 mg/dL (ref 5–24)

## 2023-04-09 ENCOUNTER — Encounter: Payer: Self-pay | Admitting: Family Medicine

## 2023-04-09 DIAGNOSIS — Z23 Encounter for immunization: Secondary | ICD-10-CM | POA: Insufficient documentation

## 2023-04-09 DIAGNOSIS — R809 Proteinuria, unspecified: Secondary | ICD-10-CM | POA: Insufficient documentation

## 2023-04-09 NOTE — Assessment & Plan Note (Addendum)
Elevated blood pressure noted during visit, possibly due to anxiety. Patient reports normal readings at home. -Continue current management and monitor blood pressure at home. If remains elevated at next visit plan to increase antihypertensive. -Check urine for increased protein

## 2023-04-09 NOTE — Assessment & Plan Note (Signed)
Reactivation of rash on the back, similar to previous episodes. No systemic symptoms. -Prescribe antiviral medication with additional refills for future outbreaks. -Advise patient to start medication at the first sign of future outbreaks.

## 2023-04-10 ENCOUNTER — Telehealth: Payer: Self-pay

## 2023-04-10 NOTE — Telephone Encounter (Signed)
LMOM TO CB in regards to results

## 2023-04-11 NOTE — Telephone Encounter (Signed)
Patient just returned phone call from yesterday. Can someone call her about her lab results when you can. Her number is [phone]

## 2023-04-12 ENCOUNTER — Telehealth: Payer: Self-pay

## 2023-04-12 NOTE — Telephone Encounter (Signed)
Left message to return call to our office.  

## 2023-04-12 NOTE — Telephone Encounter (Signed)
The patient still sees her Kidney doctor and she will follow up with her provider on Friday.

## 2023-04-12 NOTE — Telephone Encounter (Signed)
-----   Message from Dana Allan sent at 04/09/2023  6:01 PM EDT ----- Protein in urine Is she still following with Kidney doctor?  If not does she want referral?

## 2023-04-12 NOTE — Telephone Encounter (Signed)
Lvm for pt to give office a call back

## 2023-04-13 ENCOUNTER — Ambulatory Visit: Payer: Self-pay

## 2023-04-13 NOTE — Patient Outreach (Signed)
Care Coordination   04/13/2023 Name: SIDNIE SELKE MRN: [id number] DOB: [birth year]   Care Coordination Outreach Attempts:  An unsuccessful telephone outreach was attempted for a scheduled appointment today.  HIPAA compliant message left with return call phone number.   Follow Up Plan:  Additional outreach attempts will be made to offer the patient care coordination information and services.   Encounter Outcome:  No Answer   Care Coordination Interventions:  No, not indicated    [facility] Care Coordination [phone] direct line

## 2023-04-14 ENCOUNTER — Ambulatory Visit: Payer: Medicare PPO | Admitting: Family Medicine

## 2023-04-14 NOTE — Telephone Encounter (Signed)
Daughter called back and stated that mom see kidney dr has appointment on Friday.

## 2023-04-19 ENCOUNTER — Other Ambulatory Visit (HOSPITAL_COMMUNITY): Payer: Self-pay

## 2023-04-21 ENCOUNTER — Telehealth: Payer: Self-pay | Admitting: *Deleted

## 2023-04-21 NOTE — Patient Outreach (Signed)
  Care Coordination   Follow Up Visit Note   04/21/2023 Name: SAKIYAH SHUR MRN: [id number] DOB: [birth year]  RIANA TESSMER is a 77 y.o. year old female who sees Dana Allan, MD for primary care. I {TYPECCVISIT:27766}  What matters to the patients health and wellness today?  ***    Goals Addressed             This Visit's Progress    care coordination activities       Interventions Today    Flowsheet Row Most Recent Value  General Interventions   General Interventions Discussed/Reviewed General Interventions Reviewed, Community Resources, Level of Care  [confirmed that current day program is out of grant funding, patient paying out of pocket, daughter requesting alternative options-considering all inclusive care]  Level of Care Adult Daycare  [discussed PACE program as option-[facility]]  Education Interventions   Education Provided Provided Education  [All inclusive care and assistance with applying for Medicaid to cover the cost]              SDOH assessments and interventions completed:  {yes/no:20286}{THN Tip this will not be part of the note when signed-REQUIRED REPORT FIELD DO NOT DELETE (Optional):27901}     Care Coordination Interventions:  {INTERVENTIONS:27767} {THN Tip this will not be part of the note when signed-REQUIRED REPORT FIELD DO NOT DELETE (Optional):27901}  Follow up plan: {CCFOLLOWUP:27768}   Encounter Outcome:  {ENCOUTCOME:27770} {THN Tip this will not be part of the note when signed-REQUIRED REPORT FIELD DO NOT DELETE (Optional):27901}

## 2023-04-24 NOTE — Patient Instructions (Signed)
Visit Information  Thank you for taking time to visit with me today. Please don't hesitate to contact me if I can be of assistance to you.   Following are the goals we discussed today:  Please expect a call from [facility] to discuss all inclusive care and the medicaid application process to cover care   Our next appointment is by telephone on 05/10/23 at 10am  Please call the care guide team at [phone] if you need to cancel or reschedule your appointment.   If you are experiencing a Mental Health or Behavioral Health Crisis or need someone to talk to, please call 911   Patient verbalizes understanding of instructions and care plan provided today and agrees to view in MyChart. Active MyChart status and patient understanding of how to access instructions and care plan via MyChart confirmed with patient.     Telephone follow up appointment with care management team member scheduled for: 05/10/23  Verna Czech, LCSW [place]  [facility], [facility] Health Licensed Clinical Social Worker Care Coordinator  Direct Dial: [phone]

## 2023-04-26 ENCOUNTER — Other Ambulatory Visit: Payer: Self-pay | Admitting: Family Medicine

## 2023-04-26 ENCOUNTER — Other Ambulatory Visit: Payer: Self-pay

## 2023-04-26 ENCOUNTER — Other Ambulatory Visit (HOSPITAL_COMMUNITY): Payer: Self-pay

## 2023-04-26 DIAGNOSIS — N183 Chronic kidney disease, stage 3 unspecified: Secondary | ICD-10-CM

## 2023-04-26 MED ORDER — EMPAGLIFLOZIN 10 MG PO TABS
10.0000 mg | ORAL_TABLET | Freq: Every day | ORAL | 1 refills | Status: AC
  Filled 2023-04-26: qty 30, 30d supply, fill #0
  Filled 2023-05-17 – 2023-05-23 (×3): qty 30, 30d supply, fill #1
  Filled 2023-07-05 – 2023-07-06 (×2): qty 30, 30d supply, fill #2
  Filled 2023-07-28 – 2023-08-01 (×2): qty 30, 30d supply, fill #3
  Filled 2023-08-21 – 2023-09-21 (×6): qty 30, 30d supply, fill #4

## 2023-04-28 ENCOUNTER — Other Ambulatory Visit: Payer: Self-pay

## 2023-05-08 ENCOUNTER — Other Ambulatory Visit: Payer: Self-pay

## 2023-05-10 ENCOUNTER — Other Ambulatory Visit (HOSPITAL_COMMUNITY): Payer: Self-pay

## 2023-05-10 ENCOUNTER — Telehealth: Payer: Self-pay | Admitting: *Deleted

## 2023-05-10 ENCOUNTER — Encounter: Payer: Self-pay | Admitting: *Deleted

## 2023-05-10 NOTE — Patient Outreach (Signed)
  Care Coordination   05/10/2023 Name: Natalie Watts MRN: [id number] DOB: 1945-12-15   Care Coordination Outreach Attempts:  A second unsuccessful outreach was attempted today to offer the patient with information about available care coordination services.  Follow Up Plan:  Additional outreach attempts will be made to offer the patient care coordination information and services.   Encounter Outcome:  No Answer   Care Coordination Interventions:  No, not indicated    Tamaria Dunleavy, LCSW   [facility], [facility] Health Licensed Clinical Social Worker Care Coordinator  Direct Dial: [phone]

## 2023-05-13 ENCOUNTER — Other Ambulatory Visit (HOSPITAL_COMMUNITY): Payer: Self-pay

## 2023-05-15 ENCOUNTER — Other Ambulatory Visit: Payer: Self-pay

## 2023-05-16 ENCOUNTER — Other Ambulatory Visit (HOSPITAL_COMMUNITY): Payer: Self-pay

## 2023-05-16 ENCOUNTER — Other Ambulatory Visit: Payer: Self-pay

## 2023-05-17 ENCOUNTER — Other Ambulatory Visit: Payer: Self-pay

## 2023-05-19 ENCOUNTER — Other Ambulatory Visit: Payer: Self-pay

## 2023-05-22 ENCOUNTER — Telehealth: Payer: Self-pay | Admitting: *Deleted

## 2023-05-23 ENCOUNTER — Other Ambulatory Visit: Payer: Self-pay

## 2023-05-23 ENCOUNTER — Other Ambulatory Visit (HOSPITAL_COMMUNITY): Payer: Self-pay

## 2023-05-23 NOTE — Patient Instructions (Signed)
Visit Information  Thank you for taking time to visit with me today. Please don't hesitate to contact me if I can be of assistance to you.   Following are the goals we discussed today:  Please continue to follow up on Medicaid application process for the PACE program Please follow up with the Senior Housing identified [facility]) regarding transition process  Please contact your provider with any questions regarding your medical care   Our next appointment is by telephone on 1224/24 at 11am  Please call the care guide team at [phone] if you need to cancel or reschedule your appointment.   If you are experiencing a Mental Health or Behavioral Health Crisis or need someone to talk to, please call 911   Patient verbalizes understanding of instructions and care plan provided today and agrees to view in MyChart. Active MyChart status and patient understanding of how to access instructions and care plan via MyChart confirmed with patient.     Telephone follow up appointment with care management team member scheduled for: 06/13/23  Verna Czech, LCSW [place]  [facility], [facility] Health Licensed Clinical Social Worker Care Coordinator  Direct Dial: [phone]

## 2023-05-23 NOTE — Patient Outreach (Signed)
  Care Coordination   Follow Up Visit Note   05/23/2023 late Entry Name: Natalie Watts MRN: [id number] DOB: January 13, 1946  Natalie Watts is a 77 y.o. year old female who sees Dana Allan, MD for primary care. I spoke with  Natalie Watts 's daughter Natalie Watts by phone on 05/22/23  What matters to the patients health and wellness today?  Patient's daughter confirmed plan for patient to move to [employer] in [place]. Patient will have access to the [facility] which provides, breakfast, lunch and activities. Daughter to  continue to follow up with Medicaid application process for the PACE program.    Goals Addressed             This Visit's Progress    care coordination activities       Interventions Today    Flowsheet Row Most Recent Value  Chronic Disease   Chronic disease during today's visit Other  [falls, bilateral knee pain]  General Interventions   General Interventions Discussed/Reviewed General Interventions Reviewed, Community Resources, Level of Care  [confirmed that patient has secured senior housing at [employer], electricity to be turned on next week, will continue to reside with daughter until transition occurs-senior center available(breakfast, lunch, activities)]  Level of Care Adult Daycare  [facility] program being considered, pt would not be able to  attend [facility] due to  transition to housing in [place]. Pt's daughter to f/u on PACE enrollment-currently wkg on Medicaid to cover costs- pt will  require a "spend down"]  Education Interventions   Education Provided Provided Education  Provided Verbal Education On Insurance Plans  [Medicaid application to cover PACE program and "spend down process" for eligibility]  Safety Interventions   Safety Discussed/Reviewed Safety Discussed  [discussed safety concerns following move to senior living-confirmed that pt has a med alert for safety, daughter to consider PACE program and  in home aid to  assist with dailiy care if needed]              SDOH assessments and interventions completed:  No     Care Coordination Interventions:  Yes, provided   Follow up plan: Follow up call scheduled for 06/13/23    Encounter Outcome:  Patient Visit Completed

## 2023-05-29 ENCOUNTER — Ambulatory Visit: Payer: Self-pay | Admitting: Internal Medicine

## 2023-05-31 ENCOUNTER — Other Ambulatory Visit: Payer: Self-pay

## 2023-05-31 ENCOUNTER — Other Ambulatory Visit (HOSPITAL_COMMUNITY): Payer: Self-pay

## 2023-06-09 ENCOUNTER — Ambulatory Visit: Payer: Medicare PPO | Admitting: Podiatry

## 2023-06-13 ENCOUNTER — Encounter: Payer: Self-pay | Admitting: *Deleted

## 2023-06-15 ENCOUNTER — Ambulatory Visit: Payer: Self-pay | Admitting: *Deleted

## 2023-06-15 NOTE — Patient Outreach (Signed)
Phone call to patient's daughter to discuss patient's care needs. Patient's daughter requested a return call at a later time.   Donnica Jarnagin, LCSW [place]  [facility], [facility] Health Licensed Clinical Social Worker Care Coordinator  Direct Dial: [phone]

## 2023-06-19 ENCOUNTER — Telehealth: Payer: Self-pay | Admitting: *Deleted

## 2023-06-19 NOTE — Patient Outreach (Addendum)
  Care Coordination   Follow Up Visit Note   06/19/2023 Name: JERMISHA HEINES MRN: [id number] DOB: [birth year]  LEXES WAMPOLE is a 77 y.o. year old female who sees Dana Allan, MD for primary care. I spoke with  [name] by phone today.  What matters to the patients health and wellness today?  Enrollment in the PACE program      enrollment in the PACE program       Activities and task to complete in order to accomplish goals.   Keep all upcoming appointment discussed today LEVELS OF CARE TASK Continue to work with the admissions coordinator with the PACE program for enrollment-anticipated enrollment February 2025      SDOH assessments and interventions completed:  No   Care Coordination Interventions:  Yes, provided  Interventions Today    Flowsheet Row Most Recent Value  Chronic Disease   Chronic disease during today's visit Diabetes  General Interventions   General Interventions Discussed/Reviewed General Interventions Discussed, [facility], Level of Care, Doctor Visits  [Needs assessment completed-confirmed that patient has moved into senior housing -[facility]- lunch served  daily at the [employer], no longer participating in program at [facility] due to address change]  Doctor Visits Discussed/Reviewed Doctor Visits Discussed  [Foot Doctor 06/26/23, AWV 09/22/23]  Level of Care Adult Daycare  [confirmed that pt continues to work on [employer] with intake dept on Medicaid application-will have to spend down, planned start date February 2025-currenlty participates in activities at the senior center where she resides to remain active]  Nutrition Interventions   Nutrition Discussed/Reviewed Nutrition Discussed  [breakfast and lunch served daily at the [facility] where patient lives]  Safety Interventions   Safety Discussed/Reviewed Safety Discussed  [current apartment is accessible, patient continues to [employer]- daughter and  family conitnue to follow pt closely to assist with her daily needs]       Follow up plan: Follow up call scheduled for 07/20/23    Encounter Outcome:  Patient Visit Completed

## 2023-06-19 NOTE — Patient Instructions (Signed)
Visit Information  Thank you for taking time to visit with me today. Please don't hesitate to contact me if I can be of assistance to you.   Following are the goals we discussed today:           Activities and task to complete in order to accomplish goals.   Keep all upcoming appointment discussed today LEVELS OF CARE TASK Continue to work with the admissions coordinator with the PACE program for enrollment-anticipated enrollment February 2025         Our next appointment is by telephone on 07/20/23 at 11am  Please call the care guide team at [phone] if you need to cancel or reschedule your appointment.   If you are experiencing a Mental Health or Behavioral Health Crisis or need someone to talk to, please call 911   Patient verbalizes understanding of instructions and care plan provided today and agrees to view in MyChart. Active MyChart status and patient understanding of how to access instructions and care plan via MyChart confirmed with patient.     Telephone follow up appointment with care management team member scheduled for: 07/20/23  Verna Czech, LCSW [place]  [facility], [facility] Health Licensed Clinical Social Worker Care Coordinator  Direct Dial: [phone]

## 2023-06-26 ENCOUNTER — Ambulatory Visit: Payer: Medicare PPO | Admitting: Podiatry

## 2023-06-26 DIAGNOSIS — M79675 Pain in left toe(s): Secondary | ICD-10-CM

## 2023-06-26 DIAGNOSIS — M79674 Pain in right toe(s): Secondary | ICD-10-CM | POA: Diagnosis not present

## 2023-06-26 DIAGNOSIS — I739 Peripheral vascular disease, unspecified: Secondary | ICD-10-CM

## 2023-06-26 DIAGNOSIS — M2042 Other hammer toe(s) (acquired), left foot: Secondary | ICD-10-CM | POA: Diagnosis not present

## 2023-06-26 DIAGNOSIS — E0843 Diabetes mellitus due to underlying condition with diabetic autonomic (poly)neuropathy: Secondary | ICD-10-CM

## 2023-06-26 DIAGNOSIS — E119 Type 2 diabetes mellitus without complications: Secondary | ICD-10-CM

## 2023-06-26 DIAGNOSIS — M2041 Other hammer toe(s) (acquired), right foot: Secondary | ICD-10-CM | POA: Diagnosis not present

## 2023-06-26 DIAGNOSIS — B351 Tinea unguium: Secondary | ICD-10-CM

## 2023-06-26 NOTE — Patient Instructions (Signed)
 Schedule annual appointment  in March with Vascular Surgery to check your circulation in your legs and feet.

## 2023-07-02 ENCOUNTER — Encounter: Payer: Self-pay | Admitting: Podiatry

## 2023-07-02 NOTE — Progress Notes (Signed)
 Subjective:  Patient ID: Natalie Watts, female    DOB: 24-Jun-1945,  MRN: [id number]  Natalie Watts presents to clinic today for for annual diabetic foot examination, at risk footcare. Patient has h/o diabetes, neuropathy and PAD and is seen for , and painful elongated mycotic toenails 1-5 bilaterally which are tender when wearing enclosed shoe gear. Pain is relieved with periodic professional debridement.   New problem(s): None.   PCP is Hope Merle, MD.  Allergies  Allergen Reactions   Lisinopril Anaphylaxis, Swelling and Other (See Comments)     Tongue swelling    Sulfamethoxazole-Trimethoprim Other (See Comments)    Worked against diabetic medication   Dapagliflozin Diarrhea    diarrhea   Gabapentin    Glipizide Diarrhea    Diarrhea    Metformin  Diarrhea and Other (See Comments)   Metformin  And Related     GI sx's diarrhea   Penicillin G Other (See Comments)   Penicillins     Childhood   Tramadol Nausea Only and Nausea And Vomiting    Review of Systems: Negative except as noted in the HPI.  Objective: No changes noted in today's physical examination. There were no vitals filed for this visit. Natalie Watts is a pleasant 78 y.o. female WD, WN in NAD. AAO x 3.   Title   Diabetic Foot Exam - detailed Date & Time: 06/26/2023  2:45 PM Diabetic Foot exam was performed with the following findings: Yes  Visual Foot Exam completed.: Yes  Is there a history of foot ulcer?: No Is there a foot ulcer now?: No Is there swelling?: No Is there elevated skin temperature?: No Is there abnormal foot shape?: No Is there a claw toe deformity?: No Are the toenails long?: Yes Are the toenails thick?: Yes Are the toenails ingrown?: No Is the skin thin, fragile, shiny and hairless?: Yes Normal Range of Motion?: Yes Is there foot or ankle muscle weakness?: Yes Do you have pain in calf while walking?: No Are the shoes appropriate in style and fit?: Yes Can the patient see the  bottom of their feet?: No Pulse Foot Exam completed.: Yes   Right Posterior Tibialis: Absent Left posterior Tibialis: Absent   Right Dorsalis Pedis: Absent Left Dorsalis Pedis: Absent     Sensory Foot Exam Completed.: Yes Semmes-Weinstein Monofilament Test + means has sensation and - means no sensation  R Foot Test Control: Neg L Foot Test Control: Neg   R Site 1-Great Toe: Neg L Site 1-Great Toe: Neg   R Site 4: Neg L Site 4: Neg   R site 5: Neg L Site 5: Neg  R Site 6: Neg L Site 6: Neg     Image components are not supported.   Image components are not supported. Image components are not supported.  Tuning [place] Right vibratory: present Left vibratory: present  Comments Dropfoot RLE. Hammertoe deformity b/l.     Assessment/Plan: 1. Pain due to onychomycosis of toenails of both feet   2. PAD (peripheral artery disease) (HCC)   3. Acquired hammertoes of both feet   4. Diabetes mellitus due to underlying condition with diabetic autonomic neuropathy, unspecified whether long term insulin  use (HCC)   5. Encounter for diabetic foot exam [facility])     -Patient was evaluated today. All questions/concerns addressed on today's visit. -Advised patient to schedule annual appointment with Vascular Surgery for her PAD. -Diabetic foot examination performed today. -Continue diabetic foot care principles: inspect feet daily, monitor glucose as recommended  by PCP and/or Endocrinologist, and follow prescribed diet per PCP, Endocrinologist and/or dietician. -Patient to continue soft, supportive shoe gear daily. -Patient/POA to call should there be question/concern in the interim.   Return in about 3 months (around 09/24/2023).  Delon LITTIE Merlin, DPM      [place] LOCATION: [address] [place] [address]                   Office [phone]   [facility] LOCATION: [address] [place], [place] [address] Office  [phone]

## 2023-07-05 ENCOUNTER — Other Ambulatory Visit (HOSPITAL_COMMUNITY): Payer: Self-pay

## 2023-07-05 ENCOUNTER — Other Ambulatory Visit: Payer: Self-pay

## 2023-07-06 ENCOUNTER — Other Ambulatory Visit: Payer: Self-pay

## 2023-07-07 ENCOUNTER — Ambulatory Visit: Payer: Self-pay | Admitting: Family Medicine

## 2023-07-07 NOTE — Telephone Encounter (Signed)
  Chief Complaint: vomiting Symptoms: vomiting Frequency: 1 time approx 20 minutes ago. Pertinent Negatives: Patient denies dizziness, SOB, abdominal pain Disposition: [x] ED /[] Urgent Care (no appt availability in office) / [] Appointment(In office/virtual)/ []  [place] [facility]/ [] Home Care/ [] Refused Recommended Disposition /[] [place] [facility]/ []  Follow-up with PCP Additional Notes: Patient's daughter, Vikki [place] on [place], called reporting one episode of vomiting while walking in [employer] approx 20 minutes ago. She states the patient is saying she feels like she needs to stay still, but denies all other complaints. Patient is diabetic and did take insulin last night, but reports she has not checked her blood sugar today. States she does not have her glucometer with her. Per protocol, patient to be evaluated in ED. Care advice reviewed with patient, understanding verbalized. Denies further questions at this time. Alerting PCP for review.    Copied from CRM [id number]. Topic: Clinical - Pink Word Triage >> Jul 07, 2023  3:48 PM Truddie Crumble wrote: Reason for Triage: Pt threw up and is feeling weak Reason for Disposition  High-risk adult (e.g., diabetes mellitus, brain tumor, V-P shunt, hernia)  Answer Assessment - Initial Assessment Questions 1. VOMITING SEVERITY: "How many times have you vomited in the past 24 hours?"     - MILD:  1 - 2 times/day    - MODERATE: 3 - 5 times/day, decreased oral intake without significant weight loss or symptoms of dehydration    - SEVERE: 6 or more times/day, vomits everything or nearly everything, with significant weight loss, symptoms of dehydration      1 time today 2. ONSET: "When did the vomiting begin?"      Approx 20 minutes ago 3. FLUIDS: "What fluids or food have you vomited up today?" "Have you been able to keep any fluids down?"     Yes, sips of water. 4. ABDOMEN PAIN: "Are your having any abdomen pain?" If Yes : "How bad is it and what does it  feel like?" (e.g., crampy, dull, intermittent, constant)      Denies 5. DIARRHEA: "Is there any diarrhea?" If Yes, ask: "How many times today?"      Denies 6. CONTACTS: "Is there anyone else in the family with the same symptoms?"      Denies, states they are at [employer] today 7. CAUSE: "What do you think is causing your vomiting?"     Unsure 8. HYDRATION STATUS: "Any signs of dehydration?" (e.g., dry mouth [not only dry lips], too weak to stand) "When did you last urinate?"     Denies 9. OTHER SYMPTOMS: "Do you have any other symptoms?" (e.g., fever, headache, vertigo, vomiting blood or coffee grounds, recent head injury)     Feels like she needs to be still for a while.  Protocols used: Vomiting-A-AH

## 2023-07-11 ENCOUNTER — Other Ambulatory Visit (HOSPITAL_COMMUNITY): Payer: Self-pay

## 2023-07-15 ENCOUNTER — Other Ambulatory Visit: Payer: Self-pay

## 2023-07-15 ENCOUNTER — Emergency Department
Admission: EM | Admit: 2023-07-15 | Discharge: 2023-07-15 | Disposition: A | Discharge status: Home / Self Care | Payer: Medicare PPO | Attending: Emergency Medicine | Admitting: Emergency Medicine

## 2023-07-15 ENCOUNTER — Emergency Department: Payer: Medicare PPO

## 2023-07-15 ENCOUNTER — Encounter: Payer: Self-pay | Admitting: Intensive Care

## 2023-07-15 DIAGNOSIS — Y92009 Unspecified place in unspecified non-institutional (private) residence as the place of occurrence of the external cause: Secondary | ICD-10-CM | POA: Insufficient documentation

## 2023-07-15 DIAGNOSIS — E119 Type 2 diabetes mellitus without complications: Secondary | ICD-10-CM | POA: Diagnosis not present

## 2023-07-15 DIAGNOSIS — R112 Nausea with vomiting, unspecified: Secondary | ICD-10-CM

## 2023-07-15 DIAGNOSIS — S0990XA Unspecified injury of head, initial encounter: Secondary | ICD-10-CM | POA: Diagnosis not present

## 2023-07-15 DIAGNOSIS — S3991XA Unspecified injury of abdomen, initial encounter: Secondary | ICD-10-CM | POA: Diagnosis not present

## 2023-07-15 DIAGNOSIS — S299XXA Unspecified injury of thorax, initial encounter: Secondary | ICD-10-CM | POA: Diagnosis not present

## 2023-07-15 DIAGNOSIS — X58XXXA Exposure to other specified factors, initial encounter: Secondary | ICD-10-CM | POA: Diagnosis not present

## 2023-07-15 DIAGNOSIS — R0789 Other chest pain: Secondary | ICD-10-CM | POA: Diagnosis not present

## 2023-07-15 DIAGNOSIS — I10 Essential (primary) hypertension: Secondary | ICD-10-CM | POA: Insufficient documentation

## 2023-07-15 DIAGNOSIS — W19XXXA Unspecified fall, initial encounter: Secondary | ICD-10-CM | POA: Diagnosis not present

## 2023-07-15 DIAGNOSIS — Z794 Long term (current) use of insulin: Secondary | ICD-10-CM | POA: Diagnosis not present

## 2023-07-15 DIAGNOSIS — S199XXA Unspecified injury of neck, initial encounter: Secondary | ICD-10-CM | POA: Diagnosis not present

## 2023-07-15 DIAGNOSIS — R55 Syncope and collapse: Secondary | ICD-10-CM | POA: Insufficient documentation

## 2023-07-15 DIAGNOSIS — S3993XA Unspecified injury of pelvis, initial encounter: Secondary | ICD-10-CM | POA: Diagnosis not present

## 2023-07-15 DIAGNOSIS — M47812 Spondylosis without myelopathy or radiculopathy, cervical region: Secondary | ICD-10-CM | POA: Diagnosis not present

## 2023-07-15 DIAGNOSIS — M549 Dorsalgia, unspecified: Secondary | ICD-10-CM | POA: Diagnosis not present

## 2023-07-15 DIAGNOSIS — R1111 Vomiting without nausea: Secondary | ICD-10-CM | POA: Diagnosis not present

## 2023-07-15 LAB — BASIC METABOLIC PANEL WITH GFR
Anion gap: 12 (ref 5–15)
BUN: 16 mg/dL (ref 8–23)
CO2: 29 mmol/L (ref 22–32)
Calcium: 9.9 mg/dL (ref 8.9–10.3)
Chloride: 101 mmol/L (ref 98–111)
Creatinine, Ser: 0.91 mg/dL (ref 0.44–1.00)
GFR, Estimated: >60 mL/min
Glucose, Bld: 139 mg/dL — ABNORMAL HIGH (ref 70–99)
Potassium: 3.8 mmol/L (ref 3.5–5.1)
Sodium: 142 mmol/L (ref 135–145)

## 2023-07-15 LAB — CBC
HCT: 42.1 % (ref 36.0–46.0)
Hemoglobin: 13.7 g/dL (ref 12.0–15.0)
MCH: 30.4 pg (ref 26.0–34.0)
MCHC: 32.5 g/dL (ref 30.0–36.0)
MCV: 93.3 fL (ref 80.0–100.0)
Platelets: 169 10*3/uL (ref 150–400)
RBC: 4.51 MIL/uL (ref 3.87–5.11)
RDW: 12.7 % (ref 11.5–15.5)
WBC: 9.9 10*3/uL (ref 4.0–10.5)
nRBC: 0 % (ref 0.0–0.2)

## 2023-07-15 LAB — HEPATIC FUNCTION PANEL
ALT: 17 U/L (ref 0–44)
AST: 33 U/L (ref 15–41)
Albumin: 4.2 g/dL (ref 3.5–5.0)
Alkaline Phosphatase: 83 U/L (ref 38–126)
Bilirubin, Direct: <0.1 mg/dL (ref 0.0–0.2)
Total Bilirubin: 0.7 mg/dL (ref 0.0–1.2)
Total Protein: 8.2 g/dL — ABNORMAL HIGH (ref 6.5–8.1)

## 2023-07-15 LAB — LIPASE, BLOOD: Lipase: 34 U/L (ref 11–51)

## 2023-07-15 MED ORDER — ONDANSETRON HCL 4 MG/2ML IJ SOLN
4.0000 mg | Freq: Once | INTRAMUSCULAR | Status: AC
  Administered 2023-07-15: 4 mg via INTRAVENOUS
  Filled 2023-07-15: qty 2

## 2023-07-15 MED ORDER — IOHEXOL 300 MG/ML  SOLN
80.0000 mL | Freq: Once | INTRAMUSCULAR | Status: AC | PRN
  Administered 2023-07-15: 80 mL via INTRAVENOUS

## 2023-07-15 NOTE — Progress Notes (Signed)
There was a consult for placing a PIV access for CT scan. Assessed both arms by [place]. No suitable veins for 22G even checked AC area. Informed patient's RN regarding this matter. HS [employer]

## 2023-07-15 NOTE — ED Notes (Signed)
IV team was not able to obtain access. Dr. Rosalia Hammers aware.

## 2023-07-15 NOTE — ED Notes (Signed)
First nurse note:  Pt to ED via [facility] for mechanical fall last night while getting insulin out of fridge. No head trauma, no LOC. Takes clopidogrel. Pain to L side under L arm. Only painful if palpated. No weakness. Has been vomiting since Monday, 3-4 times/day.   EMS VS: 172/70, 79 HR, 98% RA, 153 CBG, RR 18

## 2023-07-15 NOTE — ED Triage Notes (Signed)
Patient reports just falling to the ground. Denies legs giving out and denies LOC. Reports the next thing she knew, she was on the ground.   Patient c/o upper back pain. Denies neck pain. Denies head pain  Baseline uses Rolator/walker. Lives alone   A&O x4 in triage

## 2023-07-15 NOTE — ED Notes (Signed)
IV team at bedside

## 2023-07-15 NOTE — ED Notes (Signed)
Patient denies any episodes of vomiting after Zofran given.

## 2023-07-15 NOTE — Discharge Instructions (Signed)
You were seen in the ER today for evaluation after a fall.  Fortunately we did not find any serious injuries.  Follow-up your primary care doctor for further evaluation.  Return to the ER for new or worsening symptoms.

## 2023-07-15 NOTE — ED Notes (Signed)
Patient's daughters are concerned that patient needs to go to assisted living due to falls, but is unable to fulfill requirements of an admission. Patient does have an existing appointment with a care coordinator on Thursday, July 20, 2023.

## 2023-07-15 NOTE — ED Provider Notes (Signed)
[facility] Provider Note    Event Date/Time   First MD Initiated Contact with Patient 07/15/23 1210     (approximate)   History   Near Syncope   HPI  Natalie Watts is a 78 year old female with history of diabetes, hypertension presenting to the emergency department for evaluation after a fall.  Last night, patient went to get her insulin out of the refrigerator without using her rollator when she fell to the ground.  Does not think she hit her head.  Denies LOC or lightheadedness.  On Plavix.  She was unable to get up by herself so she crawled into her living room to get warm the floor.  She thinks she fell asleep on the floor there.  This morning, she was still unable to get up by herself so she called her son-in-law who called fire.  They were able to assist her up and walk her to her bathroom where she could change clothes, but presented to the ER to be further evaluated.  Does report that since her fall she has had 3-4 episodes of nonbloody vomiting.  Additionally reports pain over her chest wall and upper back.     Physical Exam   Triage Vital Signs: ED Triage Vitals  Encounter Vitals Group     BP 07/15/23 0920 (!) 158/78     Systolic BP Percentile --      Diastolic BP Percentile --      Pulse Rate 07/15/23 0917 81     Resp 07/15/23 0917 18     Temp 07/15/23 0920 99.2 F (37.3 C)     Temp Source 07/15/23 0920 Oral     SpO2 07/15/23 0917 100 %     Weight 07/15/23 0917 140 lb (63.5 kg)     Height 07/15/23 0917 5\' 1"  (1.549 m)     Head Circumference --      Peak Flow --      Pain Score 07/15/23 0917 6     Pain Loc --      Pain Education --      Exclude from Growth Chart --     Most recent vital signs: Vitals:   07/15/23 0920 07/15/23 1729  BP: (!) 158/78 (!) 161/77  Pulse:  76  Resp:  17  Temp: 99.2 F (37.3 C)   SpO2:  98%   Nursing notes and vital signs reviewed.  General: Adult female, sitting in wheelchair, awake, reactive Head:  Atraumatic Neck: No midline pain, freely ranging Chest: Symmetric chest rise, palpation over the left anterior and posterior chest without focal area of point tenderness cardiac: Regular rhythm and rate.  Respiratory: Lungs clear to auscultation Abdomen: Soft, nondistended. No appreciable tenderness to palpation.  Pelvis: Stable in AP and lateral compression. No tenderness to palpation. MSK: No deformity to bilateral upper and lower extremity. Full range of motion to bilateral upper lower extremity with no pain. Neuro: Alert, oriented. GCS 15. 5 out of 5 strength in bilateral upper and lower extremities. Normal sensation to light touch in bilateral upper and lower extremity. Skin: No evidence of burns or lacerations.   ED Results / Procedures / Treatments   Labs (all labs ordered are listed, but only abnormal results are displayed) Labs Reviewed  BASIC METABOLIC PANEL - Abnormal; Notable for the following components:      Result Value   Glucose, Bld 139 (*)    All other components within normal limits  HEPATIC FUNCTION PANEL - Abnormal;  Notable for the following components:   Total Protein 8.2 (*)    All other components within normal limits  CBC  LIPASE, BLOOD  CBG MONITORING, ED     EKG EKG independently reviewed interpreted by myself (ER attending) demonstrates:  EKG demonstrates normal sinus rhythm at a rate of 79, PR 162, QRS 64, QTc 438, no acute ST changes  RADIOLOGY Imaging independently reviewed and interpreted by myself demonstrates:  CT head without acute bleed CT C-spine without acute fracture CT chest abdomen pelvis without acute traumatic injury  PROCEDURES:  Critical Care performed: No  Procedures   MEDICATIONS ORDERED IN ED: Medications  ondansetron (ZOFRAN) injection 4 mg (4 mg Intravenous Given 07/15/23 1459)  iohexol (OMNIPAQUE) 300 MG/ML solution 80 mL (80 mLs Intravenous Contrast Given 07/15/23 1556)     IMPRESSION / MDM / ASSESSMENT AND PLAN /  ED COURSE  I reviewed the triage vital signs and the nursing notes.  Differential diagnosis includes, but is not limited to, intracranial bleed, spine fracture, rib fracture, pneumothorax, intra-abdominal trauma or acute medical process, anemia, electrolyte abnormality  Patient's presentation is most consistent with acute presentation with potential threat to life or bodily function.  78 year old female presenting after a fall found with multiple episodes of vomiting and areas of tenderness on exam.  CT head and basic labs from triage without significant derangement.  Will add on LFTs and lipase as well as additional imaging of her neck, chest, and abdomen pelvis given her tenderness on exam and repeated episodes of vomiting.  CT imaging reassuring.  Patient without further vomiting episodes here.  Additional labs reassuring.  Patient reevaluated and is without new complaints.  She is comfortable with discharge home.  Do think this is reasonable with her reassuring workup.  Strict return precautions provided.     FINAL CLINICAL IMPRESSION(S) / ED DIAGNOSES   Final diagnoses:  Chest wall pain  Fall in home, initial encounter  Nausea and vomiting, unspecified vomiting type     Rx / DC Orders   ED Discharge Orders     None        Note:  This document was prepared using Dragon voice recognition software and may include unintentional dictation errors.   Trinna Post, MD 07/15/23 1800

## 2023-07-17 ENCOUNTER — Telehealth: Payer: Self-pay

## 2023-07-17 NOTE — Transitions of Care (Post Inpatient/ED Visit) (Signed)
   07/17/2023  Name: Natalie Watts MRN: [id number] DOB: [birth year]  Today's TOC FU Call Status: Today's TOC FU Call Status:: Unsuccessful Call (1st Attempt) Unsuccessful Call (1st Attempt) Date: 07/17/23  Attempted to reach the patient regarding the most recent Inpatient/ED visit.  Follow Up Plan: Additional outreach attempts will be made to reach the patient to complete the Transitions of Care (Post Inpatient/ED visit) call.   Signature Karena Addison, LPN [facility] Nurse Health Advisor Direct Dial [phone]

## 2023-07-18 NOTE — Transitions of Care (Post Inpatient/ED Visit) (Signed)
   07/18/2023  Name: EKTA DANCER MRN: [id number] DOB: [birth year]  Today's TOC FU Call Status: Today's TOC FU Call Status:: Unsuccessful Call (2nd Attempt) Unsuccessful Call (1st Attempt) Date: 07/17/23 Unsuccessful Call (2nd Attempt) Date: 07/18/23  Attempted to reach the patient regarding the most recent Inpatient/ED visit.  Follow Up Plan: Additional outreach attempts will be made to reach the patient to complete the Transitions of Care (Post Inpatient/ED visit) call.   Signature Karena Addison, LPN [facility] Nurse Health Advisor Direct Dial [phone]

## 2023-07-20 ENCOUNTER — Ambulatory Visit: Payer: Self-pay | Admitting: *Deleted

## 2023-07-20 NOTE — Patient Outreach (Signed)
  Care Coordination   Follow Up Visit Note   07/20/2023 Name: Natalie Watts MRN: [id number] DOB: 1945/09/17  Natalie Watts is a 78 y.o. year old female who sees Dana Allan, MD for primary care. I spoke with  [name] by phone today.  What matters to the patients health and wellness today?  Enrollment in [facility], completion of Advanced Directive and Health Care Power of Attorney    Goals Addressed             This Visit's Progress    enrollment in the [facility] program       Activities and task to complete in order to accomplish goals.   Keep all upcoming appointment discussed today LEVELS OF CARE TASK Continue to work with the admissions coordinator with the [facility] program for enrollment-anticipated enrollment July 22, 2023. Home Visit from [facility] coordinator scheduled for 07/24/23 CSW will mail Advanced Directive Packet to your home to be reviewed with this CSW once received         SDOH assessments and interventions completed:  No     Care Coordination Interventions:  Yes, provided  Interventions Today    Flowsheet Row Most Recent Value  Chronic Disease   Chronic disease during today's visit --  [recent fall]  General Interventions   General Interventions Discussed/Reviewed General Interventions Reviewed, [employer], General Interventions Discussed  [Continued assessment of community resource needs. Patient confirmed that participation in the [facility] program will be official on 07/22/23 [facility] coordinator will complete a home visit on 07/24/23, an aid for patient will also be arranged through [facility]]  Nutrition Interventions   Nutrition Discussed/Reviewed Nutrition Discussed  [Confirmed that [employer] and lunch has been delivered to patient through the [facility] daily]  Safety Interventions   Safety Discussed/Reviewed Safety Discussed  [Patient reports recent  fall-no injury encouraged to use walker or rollator when ambulating, as well as med alert]  Advanced  Directive Interventions   Advanced Directives Discussed/Reviewed Advanced Directives Discussed  [CSW to mail advanced diirective packet in the mail to review]       Follow up plan: Follow up call scheduled for 08/03/23    Encounter Outcome:  Patient Visit Completed

## 2023-07-20 NOTE — Patient Instructions (Signed)
Visit Information  Thank you for taking time to visit with me today. Please don't hesitate to contact me if I can be of assistance to you.   Following are the goals we discussed today:   Goals Addressed             This Visit's Progress    enrollment in the [facility] program       Activities and task to complete in order to accomplish goals.   Keep all upcoming appointment discussed today LEVELS OF CARE TASK Continue to work with the admissions coordinator with the [facility] program for enrollment-anticipated enrollment July 22, 2023. Home Visit from [facility] coordinator scheduled for 07/24/23 CSW will mail Advanced Directive Packet to your home to be reviewed with this CSW once received         Our next appointment is by telephone on 08/03/23 at 1pm  Please call the care guide team at [phone] if you need to cancel or reschedule your appointment.   If you are experiencing a Mental Health or Behavioral Health Crisis or need someone to talk to, please call 911   Patient verbalizes understanding of instructions and care plan provided today and agrees to view in MyChart. Active MyChart status and patient understanding of how to access instructions and care plan via MyChart confirmed with patient.     Telephone follow up appointment with care management team member scheduled for: 08/03/23  Verna Czech, LCSW [place]  [facility], [facility] Licensed Clinical Social Worker Care Coordinator  Direct Dial: [phone]

## 2023-07-24 NOTE — Transitions of Care (Post Inpatient/ED Visit) (Signed)
   07/24/2023  Name: Natalie Watts MRN: [id number] DOB: [birth year]  Today's TOC FU Call Status: Today's TOC FU Call Status:: Unsuccessful Call (3rd Attempt) Unsuccessful Call (1st Attempt) Date: 07/17/23 Unsuccessful Call (2nd Attempt) Date: 07/18/23 Unsuccessful Call (3rd Attempt) Date: 07/24/23  Attempted to reach the patient regarding the most recent Inpatient/ED visit.  Follow Up Plan: No further outreach attempts will be made at this time. We have been unable to contact the patient.  Signature Karena Addison, LPN [facility] Nurse Health Advisor Direct Dial [phone]

## 2023-07-28 ENCOUNTER — Other Ambulatory Visit: Payer: Self-pay

## 2023-08-01 ENCOUNTER — Other Ambulatory Visit: Payer: Self-pay

## 2023-08-02 ENCOUNTER — Other Ambulatory Visit: Payer: Self-pay

## 2023-08-03 ENCOUNTER — Encounter: Payer: Self-pay | Admitting: *Deleted

## 2023-08-07 ENCOUNTER — Other Ambulatory Visit: Payer: Self-pay

## 2023-08-07 ENCOUNTER — Other Ambulatory Visit (HOSPITAL_COMMUNITY): Payer: Self-pay

## 2023-08-07 ENCOUNTER — Telehealth: Payer: Self-pay

## 2023-08-07 NOTE — Telephone Encounter (Signed)
We received the following message via MyChart:  "Appointment canceled for Natalie Watts ([id number]) Visit type: MEDICARE AWV, SEQUENTIAL 09/22/2023 11:30 AM (40 minutes) with [facility] ANNUAL WELLNESS VISIT in [facility]   Reason for cancellation: Unhappy/Changed Provider   Patient comments: [facility] participant'  I called patient to find out which provider she is unhappy with.  I spoke with patient and she put her daughter, Vikki Ports, on the phone.  Vikki Ports states patient has changed her provider to [facility].

## 2023-08-08 ENCOUNTER — Other Ambulatory Visit: Payer: Self-pay

## 2023-08-21 ENCOUNTER — Other Ambulatory Visit: Payer: Self-pay

## 2023-08-21 ENCOUNTER — Other Ambulatory Visit: Payer: Self-pay | Admitting: Family Medicine

## 2023-08-24 ENCOUNTER — Other Ambulatory Visit: Payer: Self-pay

## 2023-08-24 ENCOUNTER — Other Ambulatory Visit (HOSPITAL_COMMUNITY): Payer: Self-pay

## 2023-08-24 MED ORDER — VITAMIN D3 50 MCG (2000 UT) PO TABS
2000.0000 [IU] | ORAL_TABLET | Freq: Every day | ORAL | 1 refills | Status: DC
  Filled 2023-08-24: qty 100, 30d supply, fill #0

## 2023-09-01 ENCOUNTER — Other Ambulatory Visit: Payer: Self-pay

## 2023-09-06 ENCOUNTER — Other Ambulatory Visit: Payer: Self-pay

## 2023-09-06 DIAGNOSIS — I70213 Atherosclerosis of native arteries of extremities with intermittent claudication, bilateral legs: Secondary | ICD-10-CM

## 2023-09-18 ENCOUNTER — Ambulatory Visit (INDEPENDENT_AMBULATORY_CARE_PROVIDER_SITE_OTHER): Payer: Medicare (Managed Care) | Admitting: Surgery

## 2023-09-18 ENCOUNTER — Ambulatory Visit (HOSPITAL_COMMUNITY)
Admission: RE | Admit: 2023-09-18 | Discharge: 2023-09-18 | Disposition: A | Discharge status: Home / Self Care | Payer: Medicare (Managed Care) | Source: Ambulatory Visit | Attending: Surgery | Admitting: Surgery

## 2023-09-18 ENCOUNTER — Encounter: Payer: Self-pay | Admitting: Surgery

## 2023-09-18 ENCOUNTER — Ambulatory Visit (INDEPENDENT_AMBULATORY_CARE_PROVIDER_SITE_OTHER)
Admission: RE | Admit: 2023-09-18 | Discharge: 2023-09-18 | Disposition: A | Discharge status: Home / Self Care | Payer: Medicare (Managed Care) | Source: Ambulatory Visit | Attending: Surgery | Admitting: Surgery

## 2023-09-18 VITALS — BP 155/77 | HR 65 | Temp 98.7°F | Ht 61.0 in | Wt 132.0 lb

## 2023-09-18 DIAGNOSIS — I70213 Atherosclerosis of native arteries of extremities with intermittent claudication, bilateral legs: Secondary | ICD-10-CM | POA: Diagnosis present

## 2023-09-18 LAB — VAS US ABI WITH/WO TBI
Left ABI: 0.77
Right ABI: 0.85

## 2023-09-18 NOTE — Progress Notes (Signed)
 [facility] [place]  Patient name: Natalie Watts MRN: [id number] DOB: [birth year] Sex: female   REASON FOR VISIT:    Follow-up  HISOTRY OF PRESENT ILLNESS:    Natalie Watts is a 78 y.o. female, who is here to establish vascular care.  In December 2020 in [place] she underwent placement of bilateral superficial femoral artery stenting.  She tells me that she did not have any symptoms of claudication at that time, but that her noninvasive imaging suggested significant stenosis.  She then moved to [place] and in March 2021 she underwent angioplasty of in-stent stenosis on the left.  She had subsequently gone to occlude the stents in her left leg.  She developed in-stent stenosis on the right and went for angiography on 06/01/2021 where an additional right SFA stent was placed.  Confirmation of left SFA occlusion was performed.  She is here today for follow-up.  She continues to complain of neuropathy and knee pain.  She does not endorse typical claudication symptoms  Patient has foot drop in her right leg secondary to a low back issue.  She has a history of DVT on the left in 2014.  She suffers from chronic stage III renal insufficiency.  She is a diabetic.  She is on a statin for hypercholesterolemia.  She is medically managed for hypertension with an ARB.  She is on Plavix for antiplatelet therapy.  She is a former smoker.    PAST MEDICAL HISTORY:   Past Medical History:  Diagnosis Date   Abnormal CT of the chest 03/20/2022   Abnormal gait 10/13/2021   Abnormal MRI, lumbar spine 10/22/2021   Adjustment disorder with depressed mood 07/23/2021   AKI (acute kidney injury) (HCC) 05/25/2021   Allergic conjunctivitis 10/09/2017   ANA positive 05/24/2021   Arthritis    lumbar, cervical, cervical/lumbar spondylosis   Arthritis of left hip 02/22/2022   Arthritis of left knee 12/22/2021   Bilateral hip joint arthritis 02/22/2022    Bilateral hip pain 12/22/2021   Blood clotting disorder (HCC)    Bronchiectasis (HCC) 03/17/2022   CAD (coronary artery disease)    CAD (coronary artery disease) 05/17/2021   Candidiasis of skin 01/24/2017   Cardiac murmur    Carotid bruit    US carotid had 09/03/14 [facility]    Carotid bruit 06/01/2017   Carpal tunnel syndrome    Cataracts, bilateral    Cervical radiculopathy    improved since surgery 2016    Cervical radiculopathy 01/24/2017   Cervical spondylosis without myelopathy 06/05/2014   Cervicalgia 04/05/2018   Chicken pox    Chronic bursitis of left shoulder 05/27/2021   Chronic left shoulder pain 05/25/2021   Chronic pain of left knee 07/23/2021   CKD (chronic kidney disease) stage 3, GFR 30-59 ml/min (HCC)    per Dr Ailene Ravel Su notes [place] noted 12/14/20   Colon polyp    12/18/14 tubulovillous adenoma high grade dysplasia Dr. Griffith Citron [facility]    COVID 19+    mid 11/2020   COVID-19    01/17/22   Degenerative disk disease    Diabetes (HCC)    Diabetes (HCC) 04/16/2013   Overview:   Last A1c in Oct. 6.2.  Recheck at next visit.  No medications right now. Working on increasing activity.  Next step: add back metformin at a low dose if necessary.       Overview:   Last A1c in Oct. 6.2.  Recheck at  next visit.  No medications right now. Working on increasing activity.  Next step: add back metformin at a low dose if necessary.       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      Overview:   Controlled on amlodipine.      Overview:   Overview:   Overview:   Controlled on amlodipine.    Hyperlipidemia    Hypertension    Impaired instrumental activities of daily living (IADL) 12/22/2021   Impaired mobility and ADLs 12/22/2021   Impairment of balance 01/24/2017   Incontinence of feces 12/08/2021   Insomnia    Insomnia 01/24/2017   Intertrigo    Left lumbar radiculitis 10/19/2012   Low back pain 04/05/2018   Lumbar radiculopathy    improved after steroid inj and PT 10/21/20 had right L4/5 L5-S1 transforaminal epidural steroid injections   LVH (left ventricular hypertrophy) 05/27/2021   Malignant tumor of colon (HCC) 12/26/2014   Nontraumatic complete tear of right rotator cuff 04/16/2018   Obesity 01/24/2017   Onychomycosis    Onychomycosis 05/17/2021   OSA on CPAP    dx in 2020 after hip surgery CPAP 9-13 cm H20 supplies [employer] PSG 03/18/2019 severe OSA AHI 42.1/hr titration 03/27/2019 rec APAP 9-13 cm H20   Osteoarthritis of knee 01/16/2017   PAD (peripheral artery disease) (HCC)    1 x right, 3 x left in 2020-2022 needs bypass on leg had CTA with run off 11/2020 in [place]   PAD (peripheral artery disease) (HCC)    with stents   PAD (peripheral artery disease) (HCC) 03/10/2016   B/l stents placed while living in [place] then moved to [place] and vascular MD Dr. Maricela Bo in [place]  Note 07/27/20 ABI right 0.82 and left 0.35      CTA with runoff severe calcified plaque prox SFA multifocal moderate to severe areas of calcified plaque right popliteal artery with associated moderate to severe areas of stenosis moderate to severe focal areas of stenosis in the  tibioperoneal t   Peripheral vascular disease (HCC)    Primary osteoarthritis of first carpometacarpal joint of left hand 06/05/2014   Right foot drop    since 2020   Right foot drop 05/17/2021   Rotator cuff tendinitis, right 04/16/2018   Screening for colorectal cancer 11/30/2014   Shoulder pain, right 05/08/2018   Spinal stenosis of lumbar region 10/22/2021   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/2012   Spinal stenosis, lumbar 10/14/2021   Subdeltoid bursitis    Synovitis of ankle 12/15/2016   Thoracic arthritis 03/17/2022   Thoracic spinal  stenosis 12/08/2021   Tinea pedis    Tubular adenoma of colon 05/25/2021   Vitamin D deficiency      FAMILY HISTORY:   Family History  Problem Relation Age of Onset   Hypertension Mother    Arthritis Mother    Cancer Father        colon cancer dx'ed died age 3   Hypertension Sister    Arthritis Sister    Cancer Sister        died in 40s brain tumor h/o lung cancer smoker died 2015/10/13    Early death Sister    Breast cancer Maternal Grandmother    Cancer Maternal Grandmother        breast dx older age    Hypertension Maternal Grandmother    Breast cancer Other        maternal neice   Cancer Other        died in 42s    Hypertension Daughter    Hypertension Daughter     SOCIAL HISTORY:   Social History   Tobacco Use   Smoking status: Former    Current packs/day: 0.00    Average packs/day: 1 pack/day for 45.0 years (45.0 ttl pk-yrs)    Types: Cigarettes    Start date: 12/19/1967    Quit date: 12/18/2012    Years since quitting: 10.7    Passive exposure: Never   Smokeless tobacco: Never  Substance Use Topics   Alcohol use: No     ALLERGIES:   Allergies  Allergen Reactions   Lisinopril Anaphylaxis, Swelling and Other (See Comments)     Tongue swelling    Sulfamethoxazole-Trimethoprim Other (See Comments)    Worked against diabetic medication   Dapagliflozin Diarrhea    diarrhea    Gabapentin    Glipizide Diarrhea    Diarrhea    Metformin Diarrhea and Other (See Comments)   Metformin And Related     GI sx's diarrhea   Penicillin G Other (See Comments)   Penicillins     Childhood   Tramadol Nausea Only and Nausea And Vomiting     CURRENT MEDICATIONS:   Current Outpatient Medications  Medication Sig Dispense Refill   acetaminophen (TYLENOL) 500 MG tablet Take 2 tablets (1,000 mg total) by mouth 2 (two) times daily as needed for moderate pain or mild pain. Rapid release 90 tablet 1   amLODipine (NORVASC) 5 MG tablet Take 1 tablet (5 mg total) by mouth daily. 90 tablet 1   B-D UF III MINI PEN NEEDLES 31G X 5 MM MISC Inject 1 each into the skin 2 (two) times daily. Or preferred choice of the patient 300 each 3   Blood Glucose Monitoring Suppl (ONE TOUCH ULTRA 2) w/Device KIT Used to check blood sugars daily. 1 each 0   Cholecalciferol (VITAMIN D3) 50 MCG (2000 UT) TABS Take 1 tablet (2,000 Units total) by mouth daily. 90 tablet 1   clobetasol cream (TEMOVATE) 0.05 % Apply 1 Application topically 2 (two) times daily. Legs and hands prn 30 g 0   clopidogrel (PLAVIX) 75 MG tablet Take 1 tablet (75 mg total) by mouth daily. 90 tablet 1   cyanocobalamin (VITAMIN B12) 1000 MCG tablet Take 1 tablet (1,000 mcg total) by mouth daily. 90 tablet 1   Dulaglutide (TRULICITY) 3 MG/0.5ML SOAJ Inject 3 mg as directed once a week. 2 mL 2   DULoxetine (CYMBALTA) 30 MG capsule Take 1 capsule (30 mg total) by mouth daily. 90 capsule 1  empagliflozin (JARDIANCE) 10 MG TABS tablet Take 1 tablet (10 mg total) by mouth daily before breakfast. 90 tablet 1   ezetimibe (ZETIA) 10 MG tablet Take 1 tablet (10 mg total) by mouth daily. 90 tablet 1   Lancets Ultra Fine MISC 1 Device by Does not apply route 2 (two) times daily as needed. Preferred for her device 180 each 3   LANTUS SOLOSTAR 100 UNIT/ML Solostar Pen Inject 30 Units into the skin daily. 15 mL 11   leflunomide (ARAVA) 20 MG tablet  Take 1 tablet (20 mg total) by mouth daily. 30 tablet 0   losartan (COZAAR) 100 MG tablet Take 1 tablet (100 mg total) by mouth daily. 90 tablet 1   nebivolol (BYSTOLIC) 5 MG tablet Take 1 tablet (5 mg total) by mouth daily. 90 tablet 1   potassium chloride SA (KLOR-CON M) 20 MEQ tablet Take 1 tablet (20 mEq total) by mouth every other day. 45 tablet 3   thiamine (VITAMIN B1) 100 MG tablet Take 1 tablet (100 mg total) by mouth daily. 90 tablet 1   valACYclovir (VALTREX) 1000 MG tablet Take 1 tablet (1,000 mg total) by mouth 3 (three) times daily. 30 tablet 11   No current facility-administered medications for this visit.    REVIEW OF SYSTEMS:   [X]  denotes positive finding, [ ]  denotes negative finding Cardiac  Comments:  Chest pain or chest pressure:    Shortness of breath upon exertion:    Short of breath when lying flat:    Irregular heart rhythm:        Vascular    Pain in calf, thigh, or hip brought on by ambulation:    Pain in feet at night that wakes you up from your sleep:     Blood clot in your veins:    Leg swelling:         Pulmonary    Oxygen at home:    Productive cough:     Wheezing:         Neurologic    Sudden weakness in arms or legs:     Sudden numbness in arms or legs:     Sudden onset of difficulty speaking or slurred speech:    Temporary loss of vision in one eye:     Problems with dizziness:         Gastrointestinal    Blood in stool:     Vomited blood:         Genitourinary    Burning when urinating:     Blood in urine:        Psychiatric    Major depression:         Hematologic    Bleeding problems:    Problems with blood clotting too easily:        Skin    Rashes or ulcers:        Constitutional    Fever or chills:      PHYSICAL EXAM:   Vitals:   09/18/23 1048  BP: (!) 155/77  Pulse: 65  Temp: 98.7 F (37.1 C)  SpO2: 97%  Weight: 132 lb (59.9 kg)  Height: 5\' 1"  (1.549 m)    GENERAL: The patient is a well-nourished  female, in no acute distress. The vital signs are documented above. CARDIAC: There is a regular rate and rhythm.  VASCULAR: Nonpalpable pedal pulses PULMONARY: Non-labored respirations MUSCULOSKELETAL: There are no major deformities or cyanosis. NEUROLOGIC: No focal weakness or paresthesias are detected. SKIN:  There are no ulcers or rashes noted. PSYCHIATRIC: The patient has a normal affect.  STUDIES:   I have reviewed the following : ABI/TBIToday's ABIToday's TBIPrevious ABIPrevious TBI  +-------+-----------+-----------+------------+------------+  Right 0.85       0.43       [place]          0.52          +-------+-----------+-----------+------------+------------+  Left  0.77       0.39       0.76        0.41          +-------+-----------+-----------+------------+------------+    MEDICAL ISSUES:   PAD: The patient has known occlusion of her left SFA stents.  The right SFA stents are widely patent.  I stressed today to the patient that I do not feel her symptoms are related to arterial insufficiency.  She mainly complains of knee pain.  She does not endorse typical claudication symptoms.  I would continue with surveillance with repeat study in 6 to 9 months.    Charlena Cross, MD, FACS [facility] [phone] Pager [phone]

## 2023-09-21 ENCOUNTER — Other Ambulatory Visit: Payer: Self-pay

## 2023-09-25 ENCOUNTER — Ambulatory Visit: Payer: Medicare PPO | Admitting: Podiatry

## 2024-01-25 ENCOUNTER — Encounter: Payer: Medicare (Managed Care) | Admitting: Internal Medicine

## 2024-01-25 ENCOUNTER — Encounter: Payer: Self-pay | Admitting: Sleep Medicine

## 2024-01-25 ENCOUNTER — Ambulatory Visit (INDEPENDENT_AMBULATORY_CARE_PROVIDER_SITE_OTHER): Payer: Medicare (Managed Care) | Admitting: Sleep Medicine

## 2024-01-25 VITALS — BP 120/80 | HR 60 | Temp 98.1°F | Ht 61.0 in | Wt 138.4 lb

## 2024-01-25 DIAGNOSIS — I10 Essential (primary) hypertension: Secondary | ICD-10-CM

## 2024-01-25 DIAGNOSIS — G4733 Obstructive sleep apnea (adult) (pediatric): Secondary | ICD-10-CM

## 2024-01-25 NOTE — Patient Instructions (Signed)
 Will complete in lab sleep study and follow up to review results.

## 2024-01-25 NOTE — Progress Notes (Signed)
 Name:Natalie Watts MRN: [id number] DOB: 14-Sep-1945   CHIEF COMPLAINT:  REASSESSMENT OF OSA   HISTORY OF PRESENT ILLNESS:  Natalie Watts is a 78 y.o. w/ a h/o OSA, HTN, DMII, GERD and hyperlipidemia who presents for reassessment of OSA. Reports that she was initially diagnosed with OSA several years ago and subsequently started on CPAP therapy. Reports using CPAP therapy for a few years and then discontinued use due to discomfort. Reports a 25 lb weight loss over the last year. Denies nocturnal awakenings. Denies morning headaches, RLS symptoms, dream enactment, cataplexy, hypnagogic or hypnapompic hallucinations. Denies a family history of sleep apnea. Denies drowsy driving. Drinks 1 cup of coffee daily, denies alcohol or drug use. Former smoker.    Bedtime 9:30-10 pm Sleep onset 20 mins Rise time 6 am   EPWORTH SLEEP SCORE 7    01/25/2024    1:00 PM  Results of the Epworth flowsheet  Sitting and reading 0  Watching TV 3  Sitting, inactive in a public place (e.g. a theatre or a meeting) 0  As a passenger in a car for an hour without a break 2  Lying down to rest in the afternoon when circumstances permit 2  Sitting and talking to someone 0  Sitting quietly after a lunch without alcohol 0  In a car, while stopped for a few minutes in traffic 0  Total score 7    PAST MEDICAL HISTORY :   has a past medical history of Abnormal CT of the chest (03/20/2022), Abnormal gait (10/13/2021), Abnormal MRI, lumbar spine (10/22/2021), Adjustment disorder with depressed mood (07/23/2021), AKI (acute kidney injury) (HCC) (05/25/2021), Allergic conjunctivitis (10/09/2017), ANA positive (05/24/2021), Arthritis, Arthritis of left hip (02/22/2022), Arthritis of left knee (12/22/2021), Bilateral hip joint arthritis (02/22/2022), Bilateral hip pain (12/22/2021), Blood clotting disorder (HCC), Bronchiectasis (HCC) (03/17/2022), CAD (coronary artery disease), CAD (coronary artery disease) (05/17/2021),  Candidiasis of skin (01/24/2017), Cardiac murmur, Carotid bruit, Carotid bruit (06/01/2017), Carpal tunnel syndrome, Cataracts, bilateral, Cervical radiculopathy, Cervical radiculopathy (01/24/2017), Cervical spondylosis without myelopathy (06/05/2014), Cervicalgia (04/05/2018), Chicken pox, Chronic bursitis of left shoulder (05/27/2021), Chronic left shoulder pain (05/25/2021), Chronic pain of left knee (07/23/2021), CKD (chronic kidney disease) stage 3, GFR 30-59 ml/min (HCC), Colon polyp, COVID 19+, COVID-19, Degenerative disk disease, Diabetes (HCC), Diabetes (HCC) (04/16/2013), DVT (deep venous thrombosis) (HCC), DVT, lower extremity (HCC), Dysplastic polyp of colon (12/25/2014), Gait abnormality (12/08/2021), Gallstones (05/17/2021), Gastroesophageal reflux disease without esophagitis (01/24/2017), Generalized osteoarthritis (01/24/2017), 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), GERD (gastroesophageal reflux disease), Gout, Gout (05/17/2021), H/O myocardial perfusion scan, Hammer toes, bilateral, Healthcare maintenance (04/16/2013), Heart murmur (06/16/2017), Hiatal hernia (05/17/2021), History of degenerative disc disease (06/01/2017), HSV-2 infection, HTN (hypertension) (04/16/2013), Hyperlipidemia, Hypertension, Impaired instrumental activities of daily living (IADL) (12/22/2021), Impaired mobility and ADLs (12/22/2021), Impairment of balance (01/24/2017), Incontinence of feces (12/08/2021), Insomnia, Insomnia (01/24/2017), Intertrigo, Left lumbar radiculitis (10/19/2012), Low back pain (04/05/2018), Lumbar radiculopathy, LVH (left ventricular hypertrophy) (05/27/2021), Malignant tumor of colon (HCC) (12/26/2014), Nontraumatic complete tear of right rotator cuff (04/16/2018), Obesity (01/24/2017), Onychomycosis, Onychomycosis (05/17/2021), OSA on CPAP, Osteoarthritis of knee (01/16/2017), PAD (peripheral artery disease) (HCC), PAD (peripheral artery disease) (HCC), PAD (peripheral artery disease) (HCC)  (03/10/2016), Peripheral vascular disease (HCC), Primary osteoarthritis of first carpometacarpal joint of left hand (06/05/2014), Right foot drop, Right foot drop (05/17/2021), Rotator cuff tendinitis, right (04/16/2018), Screening for colorectal cancer (11/30/2014), Shoulder pain, right (05/08/2018), Spinal stenosis of lumbar region (10/22/2021), Spinal stenosis of lumbar region with neurogenic  claudication (01/17/2012), Spinal stenosis, lumbar (10/14/2021), Subdeltoid bursitis, Synovitis of ankle (12/15/2016), Thoracic arthritis (03/17/2022), Thoracic spinal stenosis (12/08/2021), Tinea pedis, Tubular adenoma of colon (05/25/2021), and Vitamin D  deficiency.  has a past surgical history that includes Colon surgery; Back surgery; Tubal ligation; right total hip (Right); Colonoscopy (05/01/2020); Vascular surgery; Vascular surgery; Vascular surgery; Upper gi endoscopy; Esophagogastroduodenoscopy (05/01/2020); ABDOMINAL AORTOGRAM W/LOWER EXTREMITY (N/A, 06/01/2021); and PERIPHERAL VASCULAR INTERVENTION (06/01/2021). Prior to Admission medications   Medication Sig Start Date End Date Taking? Authorizing Provider  acyclovir (ZOVIRAX) 400 MG tablet Take 400 mg by mouth 2 (two) times daily. 04/04/12  Yes [provider]  magnesium oxide (MAG-OX) 400 MG tablet Take 400 mg by mouth daily. 01/17/12  Yes [provider]  pravastatin  (PRAVACHOL ) 40 MG tablet Take 40 mg by mouth daily. 12/13/21  Yes [provider]  acetaminophen  (TYLENOL ) 500 MG tablet Take 2 tablets (1,000 mg total) by mouth 2 (two) times daily as needed for moderate pain or mild pain. Rapid release 03/02/23   Hope Merle, MD  amLODipine  (NORVASC ) 5 MG tablet Take 1 tablet (5 mg total) by mouth daily. 04/03/23   Walsh, Tanya, MD  atenolol-chlorthalidone (TENORETIC) 50-25 MG tablet Take 1 tablet by mouth daily.    [provider]  B-D UF III MINI PEN NEEDLES 31G X 5 MM MISC Inject 1 each into the skin 2 (two) times  daily. Or preferred choice of the patient 01/31/23   Hope Merle, MD  Blood Glucose Monitoring Suppl (ONE TOUCH ULTRA 2) w/Device KIT Used to check blood sugars daily. 05/08/18   McLean-Scocuzza, Randine SAILOR, MD  Calcium  Carb-Cholecalciferol  600-10 MG-MCG TABS Take by mouth.    [provider]  Cholecalciferol  50 MCG (2000 UT) CAPS Take 2,000 Units by mouth.    [provider]  clobetasol  cream (TEMOVATE ) 0.05 % Apply 1 Application topically 2 (two) times daily. Legs and hands prn 12/13/22   Hope Merle, MD  clopidogrel  (PLAVIX ) 75 MG tablet Take 1 tablet (75 mg total) by mouth daily. 04/03/23   Hope Merle, MD  Continuous Glucose Sensor (FREESTYLE LIBRE 2 SENSOR) MISC 1 EACH BY DOES NOT APPLY ROUTE EVERY 14 (FOURTEEN) DAYS.; Duration: 28    [provider]  cyanocobalamin  (VITAMIN B12) 1000 MCG tablet Take 1 tablet (1,000 mcg total) by mouth daily. 09/09/22   Hope Merle, MD  Dulaglutide  (TRULICITY ) 3 MG/0.5ML SOAJ Inject 3 mg as directed once a week. 03/02/23   Hope Merle, MD  DULoxetine  (CYMBALTA ) 30 MG capsule Take 1 capsule (30 mg total) by mouth daily. 04/03/23   Hope Merle, MD  empagliflozin  (JARDIANCE ) 10 MG TABS tablet Take 1 tablet (10 mg total) by mouth daily before breakfast. 04/26/23   Hope Merle, MD  EPINEPHrine 0.3 mg/0.3 mL IJ SOAJ injection Inject 0.3 mg into the muscle as needed for anaphylaxis.    [provider]  ezetimibe  (ZETIA ) 10 MG tablet Take 1 tablet (10 mg total) by mouth daily. 04/03/23   Walsh, Tanya, MD  hydrocortisone 2.5 % cream Apply 1 Application topically 2 (two) times daily as needed.    [provider]  Lancets Ultra Fine MISC 1 Device by Does not apply route 2 (two) times daily as needed. Preferred for her device 07/23/21   McLean-Scocuzza, Randine SAILOR, MD  LANTUS  SOLOSTAR 100 UNIT/ML Solostar Pen Inject 30 Units into the skin daily. 10/04/22   Hope Merle, MD  leflunomide  (ARAVA ) 20 MG tablet Take 1 tablet (20 mg total)  by mouth  daily. 03/01/23     losartan  (COZAAR ) 100 MG tablet Take 1 tablet (100 mg total) by mouth daily. 04/03/23   Hope Merle, MD  nebivolol  (BYSTOLIC ) 5 MG tablet Take 1 tablet (5 mg total) by mouth daily. 04/03/23   Walsh, Tanya, MD  nystatin cream (MYCOSTATIN) Apply 1 Application topically 2 (two) times daily as needed for dry skin.    [provider]  omeprazole (PRILOSEC) 40 MG capsule Take 40 mg by mouth daily.    [provider]  potassium chloride  SA (KLOR-CON  M) 20 MEQ tablet Take 1 tablet (20 mEq total) by mouth every other day. 02/22/22   McLean-Scocuzza, Randine SAILOR, MD  sitaGLIPtin  (JANUVIA ) 100 MG tablet Take 100 mg by mouth daily.    [provider]  thiamine  (VITAMIN B1) 100 MG tablet Take 1 tablet (100 mg total) by mouth daily. 09/17/22   Hope Merle, MD  valACYclovir  (VALTREX ) 1000 MG tablet Take 1 tablet (1,000 mg total) by mouth 3 (three) times daily. 04/06/23   Hope Merle, MD  rosuvastatin  (CRESTOR ) 20 MG tablet Take 1 tablet (20 mg total) by mouth daily. 11/01/22 11/01/22  Dann Candyce RAMAN, MD   Allergies  Allergen Reactions   Lisinopril Anaphylaxis, Swelling and Other (See Comments)     Tongue swelling    Sulfamethoxazole-Trimethoprim Other (See Comments)    Worked against diabetic medication  Other Reaction(s): causes sevirly low BS , Other (See Comments)  Reaction:  Severe hypoglycemia  Other reaction(s): Other (See Comments)    Other reaction(s): Other (See Comments)    Reaction:  Severe hypoglycemia  Worked against diabetic medication  Other reaction(s): Other (See Comments)  Other Reaction(s): causes sevirly low BS     Other reaction(s): Other (See Comments)    Reaction:  Severe hypoglycemia Other reaction(s): Other (See Comments)  Other reaction(s): Other (See Comments)  Reaction:  Severe hypoglycemia Worked against diabetic medication Other reaction(s): Other (See Comments)    Other reaction(s): Other (See Comments)  Other  reaction(s): Other (See Comments)  Reaction:  Severe hypoglycemia    Reaction:  Severe hypoglycemia    Worked against diabetic medication   Dapagliflozin Diarrhea    diarrhea   Gabapentin    Glipizide Diarrhea    Diarrhea    Metformin  Diarrhea and Other (See Comments)   Metformin  And Related     GI sx's diarrhea   Penicillin G Other (See Comments)   Penicillins     Childhood   Tramadol Nausea And Vomiting and Nausea Only    Other Reaction(s): stomach upset  Other reaction(s): NAUSEA    FAMILY HISTORY:  family history includes Arthritis in her mother and sister; Breast cancer in her maternal grandmother and another family member; Cancer in her father, maternal grandmother, sister, and another family member; Early death in her sister; Hypertension in her daughter, daughter, maternal grandmother, mother, and sister. SOCIAL HISTORY:  reports that she quit smoking about 11 years ago. Her smoking use included cigarettes. She started smoking about 56 years ago. She has a 45 pack-year smoking history. She has never been exposed to tobacco smoke. She has never used smokeless tobacco. She reports current drug use. Frequency: 3.00 times per week. She reports that she does not drink alcohol.   Review of Systems:  Gen:  Denies  fever, sweats, chills weight loss  HEENT: Denies blurred vision, double vision, ear pain, eye pain, hearing loss, nose bleeds, sore throat Cardiac:  No dizziness, chest pain or heaviness, chest tightness,edema, No JVD  Resp:   No cough, -sputum production, -shortness of breath,-wheezing, -hemoptysis,  Gi: Denies swallowing difficulty, stomach pain, nausea or vomiting, diarrhea, constipation, bowel incontinence Gu:  Denies bladder incontinence, burning urine Ext:   Denies Joint pain, stiffness or swelling Skin: Denies  skin rash, easy bruising or bleeding or hives Endoc:  Denies polyuria, polydipsia , polyphagia or weight change Psych:   Denies depression, insomnia or  hallucinations  Other:  All other systems negative  VITAL SIGNS: BP 120/80 (BP Location: Right Arm, Patient Position: Sitting, Cuff Size: Normal)   Pulse 60   Temp 98.1 F (36.7 C) (Oral)   Ht 5' 1 (1.549 m)   Wt 138 lb 6.4 oz (62.8 kg)   SpO2 97%   BMI 26.15 kg/m    Physical Examination:   General Appearance: No distress  EYES PERRLA, EOM intact.   NECK Supple, No JVD Pulmonary: normal breath sounds, No wheezing.  CardiovascularNormal S1,S2.  No m/r/g.   Abdomen: Benign, Soft, non-tender. Skin:   warm, no rashes, no ecchymosis  Extremities: normal, no cyanosis, clubbing. Neuro:without focal findings,  speech normal  PSYCHIATRIC: Mood, affect within normal limits.   ASSESSMENT AND PLAN  OSA I suspect that OSA is likely still present due to clinical presentation. Discussed the consequences of untreated sleep apnea. Advised not to drive drowsy for safety of patient and others. Will complete further evaluation with a split night study and follow up to review results.    HTN Stable, on current management. Following with PCP.    MEDICATION ADJUSTMENTS/LABS AND TESTS ORDERED: Recommend Sleep Study   Patient  satisfied with Plan of action and management. All questions answered  Follow up to review sleep study results and treatment plan.   I spent a total of 30 minutes reviewing chart data, face-to-face evaluation with the patient, counseling and coordination of care as detailed above.    Felix Meras, M.D.  Sleep Medicine [facility]

## 2024-02-07 ENCOUNTER — Ambulatory Visit: Payer: Medicare (Managed Care) | Attending: Sleep Medicine

## 2024-02-07 DIAGNOSIS — G4733 Obstructive sleep apnea (adult) (pediatric): Secondary | ICD-10-CM | POA: Diagnosis not present

## 2024-02-07 DIAGNOSIS — G4761 Periodic limb movement disorder: Secondary | ICD-10-CM | POA: Insufficient documentation

## 2024-02-07 DIAGNOSIS — R0683 Snoring: Secondary | ICD-10-CM | POA: Diagnosis present

## 2024-02-22 ENCOUNTER — Ambulatory Visit (INDEPENDENT_AMBULATORY_CARE_PROVIDER_SITE_OTHER): Payer: Medicare (Managed Care) | Admitting: Sleep Medicine

## 2024-02-22 DIAGNOSIS — R0683 Snoring: Secondary | ICD-10-CM

## 2024-02-28 NOTE — Progress Notes (Signed)
 Generated letter to send to pt with results and asking to contact us  with how she would like to proceed.

## 2024-03-06 ENCOUNTER — Telehealth: Payer: Self-pay

## 2024-03-06 NOTE — Telephone Encounter (Signed)
Attempted to call patient.  Left message on VM.

## 2024-03-06 NOTE — Telephone Encounter (Signed)
 Copied from CRM [id number]. Topic: Clinical - Lab/Test Results >> Mar 04, 2024 10:51 AM Leila C wrote: Reason for CRM: Patient is returning the office call regarding sleep study results. Patient states does not have access to Mychart. Informed patient, 02/22/24 results follow-up, splite night study. Patient asked if the results was sent to [facility], and Dr. Annabella Rase? Patient would like to speak with the nurse to discuss further, please call back [phone].

## 2024-03-08 NOTE — Telephone Encounter (Signed)
 Noted, we will wait for her decision.

## 2024-03-08 NOTE — Telephone Encounter (Signed)
 Called patient.  Patient states she fell yesterday and feels very confused.  Her daughter is taking care of her.  She wants to wait until Monday , 03/11/2024 and call back to Dr. Reddy's office (her provider) in our [place] office and let them know if she wants to start on CPAP therapy.  Patient states she has tried CPAP therapy in the past and felt it was very uncomfortable to sleep with.  Will send this back to the [facility] triage pool.

## 2024-04-05 ENCOUNTER — Other Ambulatory Visit: Payer: Self-pay | Admitting: Internal Medicine

## 2024-04-05 DIAGNOSIS — Z1231 Encounter for screening mammogram for malignant neoplasm of breast: Secondary | ICD-10-CM

## 2024-04-18 ENCOUNTER — Ambulatory Visit
Admission: RE | Admit: 2024-04-18 | Discharge: 2024-04-18 | Disposition: A | Payer: Medicare (Managed Care) | Source: Ambulatory Visit

## 2024-04-18 ENCOUNTER — Other Ambulatory Visit: Payer: Self-pay

## 2024-04-18 DIAGNOSIS — M25531 Pain in right wrist: Secondary | ICD-10-CM

## 2024-04-18 DIAGNOSIS — M792 Neuralgia and neuritis, unspecified: Secondary | ICD-10-CM

## 2024-04-18 DIAGNOSIS — M069 Rheumatoid arthritis, unspecified: Secondary | ICD-10-CM

## 2024-05-23 ENCOUNTER — Ambulatory Visit
Admission: RE | Admit: 2024-05-23 | Discharge: 2024-05-23 | Disposition: A | Payer: Medicare (Managed Care) | Source: Ambulatory Visit | Attending: Internal Medicine | Admitting: Internal Medicine

## 2024-05-23 DIAGNOSIS — Z1231 Encounter for screening mammogram for malignant neoplasm of breast: Secondary | ICD-10-CM

## 2024-07-03 ENCOUNTER — Other Ambulatory Visit: Payer: Medicare (Managed Care)

## 2024-07-03 ENCOUNTER — Ambulatory Visit
Admission: RE | Admit: 2024-07-03 | Discharge: 2024-07-03 | Disposition: A | Discharge status: Home / Self Care | Payer: Medicare (Managed Care) | Source: Ambulatory Visit

## 2024-07-03 ENCOUNTER — Other Ambulatory Visit: Payer: Self-pay

## 2024-07-03 DIAGNOSIS — M25551 Pain in right hip: Secondary | ICD-10-CM

## 2024-07-09 ENCOUNTER — Emergency Department (HOSPITAL_COMMUNITY)
Admission: EM | Admit: 2024-07-09 | Discharge: 2024-07-09 | Disposition: A | Discharge status: Home / Self Care | Payer: Medicare (Managed Care) | Attending: Emergency Medicine | Admitting: Emergency Medicine

## 2024-07-09 ENCOUNTER — Emergency Department (HOSPITAL_COMMUNITY): Payer: Medicare (Managed Care)

## 2024-07-09 DIAGNOSIS — E11649 Type 2 diabetes mellitus with hypoglycemia without coma: Secondary | ICD-10-CM | POA: Insufficient documentation

## 2024-07-09 DIAGNOSIS — I2510 Atherosclerotic heart disease of native coronary artery without angina pectoris: Secondary | ICD-10-CM | POA: Insufficient documentation

## 2024-07-09 DIAGNOSIS — W19XXXA Unspecified fall, initial encounter: Secondary | ICD-10-CM | POA: Diagnosis not present

## 2024-07-09 DIAGNOSIS — R410 Disorientation, unspecified: Secondary | ICD-10-CM

## 2024-07-09 DIAGNOSIS — N189 Chronic kidney disease, unspecified: Secondary | ICD-10-CM | POA: Insufficient documentation

## 2024-07-09 DIAGNOSIS — Z79899 Other long term (current) drug therapy: Secondary | ICD-10-CM | POA: Diagnosis not present

## 2024-07-09 DIAGNOSIS — I129 Hypertensive chronic kidney disease with stage 1 through stage 4 chronic kidney disease, or unspecified chronic kidney disease: Secondary | ICD-10-CM | POA: Insufficient documentation

## 2024-07-09 DIAGNOSIS — E1122 Type 2 diabetes mellitus with diabetic chronic kidney disease: Secondary | ICD-10-CM | POA: Insufficient documentation

## 2024-07-09 DIAGNOSIS — M25552 Pain in left hip: Secondary | ICD-10-CM | POA: Insufficient documentation

## 2024-07-09 DIAGNOSIS — E162 Hypoglycemia, unspecified: Secondary | ICD-10-CM

## 2024-07-09 DIAGNOSIS — Z7902 Long term (current) use of antithrombotics/antiplatelets: Secondary | ICD-10-CM | POA: Insufficient documentation

## 2024-07-09 LAB — I-STAT VENOUS BLOOD GAS, ED
Acid-Base Excess: 2 mmol/L (ref 0.0–2.0)
Bicarbonate: 29.7 mmol/L — ABNORMAL HIGH (ref 20.0–28.0)
Calcium, Ion: 1.15 mmol/L (ref 1.15–1.40)
HCT: 46 % (ref 36.0–46.0)
Hemoglobin: 15.6 g/dL — ABNORMAL HIGH (ref 12.0–15.0)
O2 Saturation: 99 %
Potassium: 6 mmol/L — ABNORMAL HIGH (ref 3.5–5.1)
Sodium: 138 mmol/L (ref 135–145)
TCO2: 31 mmol/L (ref 22–32)
pCO2, Ven: 57.2 mmHg (ref 44–60)
pH, Ven: 7.324 (ref 7.25–7.43)
pO2, Ven: 150 mmHg — ABNORMAL HIGH (ref 32–45)

## 2024-07-09 LAB — URINALYSIS, ROUTINE W REFLEX MICROSCOPIC
Bacteria, UA: NONE SEEN
Bilirubin Urine: NEGATIVE
Glucose, UA: >=500 mg/dL — AB
Hgb urine dipstick: NEGATIVE
Ketones, ur: NEGATIVE mg/dL
Leukocytes,Ua: NEGATIVE
Nitrite: NEGATIVE
Protein, ur: NEGATIVE mg/dL
Specific Gravity, Urine: 1.02 (ref 1.005–1.030)
pH: 5 (ref 5.0–8.0)

## 2024-07-09 LAB — CBC WITH DIFFERENTIAL/PLATELET
Abs Immature Granulocytes: 0.03 K/uL (ref 0.00–0.07)
Basophils Absolute: 0 K/uL (ref 0.0–0.1)
Basophils Relative: 0 %
Eosinophils Absolute: 0.1 K/uL (ref 0.0–0.5)
Eosinophils Relative: 1 %
HCT: 47 % — ABNORMAL HIGH (ref 36.0–46.0)
Hemoglobin: 14.7 g/dL (ref 12.0–15.0)
Immature Granulocytes: 0 %
Lymphocytes Relative: 31 %
Lymphs Abs: 2.2 K/uL (ref 0.7–4.0)
MCH: 31.5 pg (ref 26.0–34.0)
MCHC: 31.3 g/dL (ref 30.0–36.0)
MCV: 100.9 fL — ABNORMAL HIGH (ref 80.0–100.0)
Monocytes Absolute: 0.5 K/uL (ref 0.1–1.0)
Monocytes Relative: 8 %
Neutro Abs: 4.3 K/uL (ref 1.7–7.7)
Neutrophils Relative %: 60 %
Platelets: 161 K/uL (ref 150–400)
RBC: 4.66 MIL/uL (ref 3.87–5.11)
RDW: 14.8 % (ref 11.5–15.5)
WBC: 7.1 K/uL (ref 4.0–10.5)
nRBC: 0 % (ref 0.0–0.2)

## 2024-07-09 LAB — CBG MONITORING, ED
Glucose-Capillary: 62 mg/dL — ABNORMAL LOW (ref 70–99)
Glucose-Capillary: 171 mg/dL — ABNORMAL HIGH (ref 70–99)

## 2024-07-09 LAB — COMPREHENSIVE METABOLIC PANEL WITH GFR
ALT: 18 U/L (ref 0–44)
AST: 27 U/L (ref 15–41)
Albumin: 4.3 g/dL (ref 3.5–5.0)
Alkaline Phosphatase: 93 U/L (ref 38–126)
Anion gap: 10 (ref 5–15)
BUN: 25 mg/dL — ABNORMAL HIGH (ref 8–23)
CO2: 27 mmol/L (ref 22–32)
Calcium: 10.1 mg/dL (ref 8.9–10.3)
Chloride: 103 mmol/L (ref 98–111)
Creatinine, Ser: 1.29 mg/dL — ABNORMAL HIGH (ref 0.44–1.00)
GFR, Estimated: 42 mL/min — ABNORMAL LOW
Glucose, Bld: 80 mg/dL (ref 70–99)
Potassium: 4.6 mmol/L (ref 3.5–5.1)
Sodium: 140 mmol/L (ref 135–145)
Total Bilirubin: 0.4 mg/dL (ref 0.0–1.2)
Total Protein: 8 g/dL (ref 6.5–8.1)

## 2024-07-09 LAB — AMMONIA: Ammonia: 19 umol/L (ref 9–35)

## 2024-07-09 LAB — ETHANOL: Alcohol, Ethyl (B): <15 mg/dL

## 2024-07-09 NOTE — Progress Notes (Unsigned)
 "  Office Visit Note  Patient: Natalie Watts             Date of Birth: 1946/06/18           MRN: [id number]             PCP: Cloria Annabella CROME, DO Referring: Cloria Annabella CROME, DO Visit Date: 07/22/2024 Occupation: Data Unavailable  Subjective:  No chief complaint on file.   History of Present Illness: Natalie Watts is a 79 y.o. female ***     Activities of Daily Living:  Patient reports morning stiffness for *** {minute/hour:19697}.   Patient {ACTIONS;DENIES/REPORTS:21021675::Denies} nocturnal pain.  Difficulty dressing/grooming: {ACTIONS;DENIES/REPORTS:21021675::Denies} Difficulty climbing stairs: {ACTIONS;DENIES/REPORTS:21021675::Denies} Difficulty getting out of chair: {ACTIONS;DENIES/REPORTS:21021675::Denies} Difficulty using hands for taps, buttons, cutlery, and/or writing: {ACTIONS;DENIES/REPORTS:21021675::Denies}  No Rheumatology ROS completed.   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   Cervical myelopathy (HCC) 09/09/2022   Overweight 09/06/2022   Mood disorder 06/22/2022   Elevated CK 06/18/2022   Emphysema lung (HCC) 03/17/2022   Lung nodules 03/17/2022   Rheumatoid arthritis (HCC) 10/13/2021   Bilateral hearing loss 07/23/2021   Hypertension associated with diabetes (HCC) 05/25/2021   Glaucoma of both eyes 05/25/2021   OSA on CPAP 05/17/2021   Aortic atherosclerosis 05/17/2021   History of fusion of cervical spine 09/07/2020   Vitamin B12 deficiency 06/01/2017   Arthritis 01/24/2017   Carpal tunnel syndrome 01/24/2017   Hyperlipidemia 01/24/2017   Lumbar  radiculopathy 01/24/2017   Peripheral vascular disease 01/24/2017   Vitamin D  deficiency 01/24/2017   GERD (gastroesophageal reflux disease) 11/30/2014   Stage 3b chronic kidney disease (HCC) 11/07/2014   Cervical spondylosis with radiculopathy 06/05/2014   DM (diabetes mellitus), type 2 with peripheral vascular complications (HCC) 04/16/2013    Past Medical History:  Diagnosis Date   Abnormal CT of the chest 03/20/2022   Abnormal gait 10/13/2021   Abnormal MRI, lumbar spine 10/22/2021   Adjustment disorder with depressed mood 07/23/2021   AKI (acute kidney injury) 05/25/2021   Allergic conjunctivitis 10/09/2017   ANA positive 05/24/2021   Arthritis    lumbar, cervical, cervical/lumbar spondylosis   Arthritis of left hip 02/22/2022   Arthritis of left knee 12/22/2021   Bilateral hip joint arthritis 02/22/2022   Bilateral hip pain 12/22/2021   Blood clotting disorder    Bronchiectasis (HCC) 03/17/2022   CAD (coronary artery disease)    CAD (coronary artery disease) 05/17/2021   Candidiasis of skin 01/24/2017   Cardiac murmur    Carotid bruit    US  carotid had 09/03/14 [facility]    Carotid bruit 06/01/2017   Carpal tunnel syndrome    Cataracts, bilateral    Cervical radiculopathy    improved since surgery 2016    Cervical radiculopathy 01/24/2017   Cervical spondylosis without myelopathy 06/05/2014   Cervicalgia 04/05/2018   Chicken pox    Chronic bursitis of left shoulder 05/27/2021   Chronic left shoulder pain 05/25/2021   Chronic pain of left knee 07/23/2021   CKD (chronic kidney disease) stage 3, GFR 30-59 ml/min (HCC)    per Dr Zhuocheng Su notes  [place] noted 12/14/20   Colon polyp    12/18/14 tubulovillous adenoma high grade dysplasia Dr. Elsie Peng [place]    COVID 19+    mid 11/2020   COVID-19    01/17/22   Degenerative disk disease    Diabetes (HCC)    Diabetes (HCC) 04/16/2013   Overview:   Last A1c in Oct. 6.2.  Recheck at next visit.   No medications right now. Working on increasing activity.  Next step: add back metformin  at a low dose if necessary.       Overview:   Last A1c in Oct. 6.2.  Recheck at next visit.  No medications right now. Working on increasing activity.  Next step: add back metformin  at a low dose if necessary.       Overview:   Overview:   Overview:      DVT (deep venous thrombosis) (HCC)    DVT, lower extremity (HCC)    left, 05/2013 on Xarelto  x 1 month etiology unkown    Dysplastic polyp of colon 12/25/2014   Gait abnormality 12/08/2021   Gallstones 05/17/2021   Gastroesophageal reflux disease without esophagitis 01/24/2017   Generalized osteoarthritis 01/24/2017   Genital herpes simplex type 2 01/24/2017   GERD (gastroesophageal reflux disease)    Gout    Gout 05/17/2021   H/O myocardial perfusion scan    06/08/20 low risk   Hammer toes, bilateral    Healthcare maintenance 04/16/2013   Overview:   Last flu shot :04/15/13   Heart murmur 06/16/2017   Hiatal hernia 05/17/2021   History of degenerative disc disease 06/01/2017   HSV-2 infection    HTN (hypertension) 04/16/2013   Overview:   Controlled on amlodipine .      Overview:   Controlled on amlodipine .      Overview:   Overview:   Overview:   Controlled on amlodipine .    Hyperlipidemia    Hypertension    Impaired instrumental activities of daily living (IADL) 12/22/2021   Impaired mobility and ADLs 12/22/2021   Impairment of balance 01/24/2017   Incontinence of feces 12/08/2021   Insomnia    Insomnia 01/24/2017   Intertrigo    Left lumbar radiculitis 10/19/2012   Low back pain 04/05/2018   Lumbar radiculopathy    improved after steroid inj and PT 10/21/20 had right L4/5 L5-S1 transforaminal epidural steroid injections   LVH (left ventricular hypertrophy) 05/27/2021   Malignant tumor of colon (HCC) 12/26/2014   Nontraumatic complete tear of right rotator cuff 04/16/2018   Obesity 01/24/2017   Onychomycosis    Onychomycosis 05/17/2021    OSA on CPAP    dx in 2020 after hip surgery CPAP 9-13 cm H20 supplies [employer] PSG 03/18/2019 severe OSA AHI 42.1/hr titration 03/27/2019 rec APAP 9-13 cm H20   Osteoarthritis of knee 01/16/2017   PAD (peripheral artery disease)    1 x right, 3 x left in 2020-2022 needs bypass on leg had CTA with run off 11/2020 in [place]   PAD (peripheral artery disease)    with stents   PAD (peripheral artery disease) 03/10/2016   B/l stents placed while living in [place] then moved to [place] and vascular MD Dr. Toribio Royals in [place]  Note 07/27/20 ABI right 0.82 and left 0.35      CTA with runoff severe calcified plaque prox SFA multifocal moderate to severe areas of calcified plaque right popliteal artery with associated moderate to severe areas of stenosis moderate to severe focal areas of stenosis  in the tibioperoneal t   Peripheral vascular disease    Primary osteoarthritis of first carpometacarpal joint of left hand 06/05/2014   Right foot drop    since Aug 06, 2018   Right foot drop 05/17/2021   Rotator cuff tendinitis, right 04/16/2018   Screening for colorectal cancer 11/30/2014   Shoulder pain, right 05/08/2018   Spinal stenosis of lumbar region 10/22/2021   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/2012   Spinal stenosis, lumbar 10/14/2021   Subdeltoid bursitis    Synovitis of ankle 12/15/2016   Thoracic arthritis 03/17/2022   Thoracic spinal stenosis 12/08/2021   Tinea pedis    Tubular adenoma of colon 05/25/2021   Vitamin D  deficiency     Family History  Problem Relation Age of Onset   Hypertension Mother    Arthritis Mother    Cancer Father        colon cancer dx'ed died age 25   Hypertension Sister    Arthritis Sister    Cancer Sister        died in 92s brain tumor h/o lung cancer smoker died 08-07-15    Early death Sister    Breast cancer Maternal Grandmother    Cancer Maternal Grandmother        breast dx older age    Hypertension Maternal Grandmother    Breast  cancer Other        maternal neice   Cancer Other        died in 19s    Hypertension Daughter    Hypertension Daughter    Past Surgical History:  Procedure Laterality Date   ABDOMINAL AORTOGRAM W/LOWER EXTREMITY N/A 06/01/2021   Procedure: ABDOMINAL AORTOGRAM W/LOWER EXTREMITY;  Surgeon: Serene Gaile ORN, MD;  Location: [facility];  Service: Cardiovascular;  Laterality: N/A;   BACK SURGERY     cervical laminoplasty C3-C7 Rex Dr. Estevan fusion/decompression 2/2 myelopathy 03/2014 or 02/2015   COLON SURGERY     colectomy in 2013-08-06   COLONOSCOPY  05/01/2020   [place] [facility] Dr. Claudetta Adeniji 12 mm d colon polyp, grade 1 IH, moderate diverticulosis tubular   ESOPHAGOGASTRODUODENOSCOPY  05/01/2020   erosive gastritis Dr. Claudetta Flank [place]   PERIPHERAL VASCULAR INTERVENTION  06/01/2021   Procedure: PERIPHERAL VASCULAR INTERVENTION;  Surgeon: Serene Gaile ORN, MD;  Location: [facility];  Service: Cardiovascular;;  RT. SFA   right total hip Right    03/05/2019 or 02/26/2020   TUBAL LIGATION     UPPER GI ENDOSCOPY     05/01/20   VASCULAR SURGERY     08/19/19   VASCULAR SURGERY     07/05/19   VASCULAR SURGERY     05/21/2019 angioplasty since 05/2019, 2019/08/07 and 08/2019 had 2 left leg and 1 on right leg   Social History[1] Social History   Social History Narrative   Divorced    2 daughters Dawna comes to most appts and is clinical research associate    -lives with daughter Dawna    Used to work for [employer] now [employer]    Former smoker age 68 to age 79/2012 quit for sure in 08-06-12 max 1/2 ppd FH lung cancer sister also smoker      Immunization History  Administered Date(s) Administered   Fluad Quad(high Dose 65+) 05/31/2022   Fluad Trivalent(High Dose 65+) 04/06/2023   Hepatitis B, ADULT 06/15/2017, 08/17/2017, 12/20/2017   INFLUENZA, HIGH DOSE SEASONAL PF 04/07/2017, 04/05/2018, 04/09/2021  Influenza,inj,Quad PF,6+ Mos 04/09/2013    Influenza,inj,quad, With Preservative 03/20/2020   Influenza-Unspecified 04/09/2013   Moderna Sars-Covid-2 Vaccination 09/19/2019   PFIZER Comirnaty(Gray Top)Covid-19 Tri-Sucrose Vaccine 12/02/2020   PFIZER(Purple Top)SARS-COV-2 Vaccination 08/29/2019, 09/26/2019, 04/29/2020   PPD Test 01/31/2023   Pneumococcal Conjugate-13 10/17/2017   Pneumococcal Polysaccharide-23 11/27/2008, 02/26/2020, 03/20/2020   Td 02/14/2011, 02/17/2018   Td,absorbed, Preservative Free, Adult Use, Lf Unspecified 02/14/2011     Objective: Vital Signs: There were no vitals taken for this visit.   Physical Exam   Musculoskeletal Exam: ***  CDAI Exam: CDAI Score: -- Patient Global: --; Provider Global: -- Swollen: --; Tender: -- Joint Exam 07/22/2024   No joint exam has been documented for this visit   There is currently no information documented on the homunculus. Go to the Rheumatology activity and complete the homunculus joint exam.  Investigation: No additional findings.  Imaging: No results found.  Recent Labs: Lab Results  Component Value Date   WBC 9.9 07/15/2023   HGB 13.7 07/15/2023   PLT 169 07/15/2023   NA 142 07/15/2023   K 3.8 07/15/2023   CL 101 07/15/2023   CO2 29 07/15/2023   GLUCOSE 139 (H) 07/15/2023   BUN 16 07/15/2023   CREATININE 0.91 07/15/2023   BILITOT 0.7 07/15/2023   ALKPHOS 83 07/15/2023   AST 33 07/15/2023   ALT 17 07/15/2023   PROT 8.2 (H) 07/15/2023   ALBUMIN 4.2 07/15/2023   CALCIUM  9.9 07/15/2023   GFRAA 58 (L) 09/12/2014    Speciality Comments: No specialty comments available.  Procedures:  No procedures performed Allergies: Lisinopril, Sulfamethoxazole-trimethoprim, Dapagliflozin, Gabapentin, Glipizide, Metformin , Metformin  and related, Penicillin g, Penicillins, and Tramadol   Assessment / Plan:     Visit Diagnoses: No diagnosis found.  Orders: No orders of the defined types were placed in this encounter.  No orders of the defined types were  placed in this encounter.   Face-to-face time spent with patient was *** minutes. Greater than 50% of time was spent in counseling and coordination of care.  Follow-Up Instructions: No follow-ups on file.   Alfonso Patterson, LPN  Note - This record has been created using [employer].  Chart creation errors have been sought, but may not always  have been located. Such creation errors do not reflect on  the standard of medical care.    [1]  Social History Tobacco Use   Smoking status: Former    Current packs/day: 0.00    Average packs/day: 1 pack/day for 45.0 years (45.0 ttl pk-yrs)    Types: Cigarettes    Start date: 12/19/1967    Quit date: 12/18/2012    Years since quitting: 11.5    Passive exposure: Never   Smokeless tobacco: Never  Vaping Use   Vaping status: Never Used  Substance Use Topics   Alcohol use: No   Drug use: Yes    Frequency: 3.0 times per week   "

## 2024-07-09 NOTE — ED Notes (Signed)
 Pt given orange juice, sandwich bag, and peanut butter and crackers. Pt sitting up in bed eating. Call bell within reach.

## 2024-07-09 NOTE — Discharge Instructions (Signed)
 Natalie Watts was seen in the Emergency Department after falls and confusion Her blood work CAT scan of her head and x-rays all looked okay Her blood sugar was low but then came up after she ate She was able to walk with a walker here It is important that she follows up with her primary doctor tomorrow as discussed She should continue using her walker to get around at all times to prevent falls Return to the Emergency Department for repeated falls or any other concerns

## 2024-07-09 NOTE — ED Notes (Signed)
 Pt ambulated well in hallway well with walker.

## 2024-07-09 NOTE — ED Provider Notes (Signed)
 " [place] [facility] Provider Note   CSN: [id number] Arrival date & time: 07/09/24  1150     Patient presents with: Natalie Watts Natalie Watts is a 79 y.o. female.    Fall     79 year old female with medical history significant for degenerative disc disease, diabetes, HTN, HLD, PAD CAD, CKD presenting after an unwitnessed fall at her facility, as a level 2 trauma due to Plavix  use.  Patient states that she tripped and landed on her buttocks.  She denies clear head trauma or loss of consciousness.  Endorses pain in her buttocks along her left hip but is able to range the hip.  Had had some confusion reported earlier as [facility] and the MD had said she had forgotten having her convert during the evaluation and forgot the MD had been in to see her.  No loss of consciousness.  Patient arrives GCS 14, ABC intact.  Prior to Admission medications  Medication Sig Start Date End Date Taking? Authorizing Provider  acetaminophen  (TYLENOL ) 500 MG tablet Take 2 tablets (1,000 mg total) by mouth 2 (two) times daily as needed for moderate pain or mild pain. Rapid release 03/02/23   Walsh, Tanya, MD  acyclovir (ZOVIRAX) 400 MG tablet Take 400 mg by mouth 2 (two) times daily. 04/04/12   [provider]  amLODipine  (NORVASC ) 5 MG tablet Take 1 tablet (5 mg total) by mouth daily. 04/03/23   Walsh, Tanya, MD  atenolol-chlorthalidone (TENORETIC) 50-25 MG tablet Take 1 tablet by mouth daily.    [provider]  B-D UF III MINI PEN NEEDLES 31G X 5 MM MISC Inject 1 each into the skin 2 (two) times daily. Or preferred choice of the patient 01/31/23   Hope Merle, MD  Blood Glucose Monitoring Suppl (ONE TOUCH ULTRA 2) w/Device KIT Used to check blood sugars daily. 05/08/18   McLean-Scocuzza, Randine SAILOR, MD  Calcium  Carb-Cholecalciferol  600-10 MG-MCG TABS Take by mouth.    [provider]  Cholecalciferol  50 MCG (2000  UT) CAPS Take 2,000 Units by mouth.    [provider]  clobetasol  cream (TEMOVATE ) 0.05 % Apply 1 Application topically 2 (two) times daily. Legs and hands prn 12/13/22   Hope Merle, MD  clopidogrel  (PLAVIX ) 75 MG tablet Take 1 tablet (75 mg total) by mouth daily. 04/03/23   Hope Merle, MD  Continuous Glucose Sensor (FREESTYLE LIBRE 2 SENSOR) MISC 1 EACH BY DOES NOT APPLY ROUTE EVERY 14 (FOURTEEN) DAYS.; Duration: 28    [provider]  cyanocobalamin  (VITAMIN B12) 1000 MCG tablet Take 1 tablet (1,000 mcg total) by mouth daily. 09/09/22   Hope Merle, MD  Dulaglutide  (TRULICITY ) 3 MG/0.5ML SOAJ Inject 3 mg as directed once a week. 03/02/23   Hope Merle, MD  DULoxetine  (CYMBALTA ) 30 MG capsule Take 1 capsule (30 mg total) by mouth daily. 04/03/23   Hope Merle, MD  empagliflozin  (JARDIANCE ) 10 MG TABS tablet Take 1 tablet (10 mg total) by mouth daily before breakfast. 04/26/23   Hope Merle, MD  EPINEPHrine 0.3 mg/0.3 mL IJ SOAJ injection Inject 0.3 mg into the muscle as needed for anaphylaxis.    [provider]  ezetimibe  (ZETIA ) 10 MG tablet Take 1 tablet (10 mg total) by mouth daily. 04/03/23   Walsh, Tanya, MD  hydrocortisone 2.5 % cream Apply 1 Application topically 2 (two) times daily as needed.    [provider]  Lancets Ultra Fine MISC 1 Device by Does not apply route 2 (two) times daily as needed. Preferred for her device 07/23/21   McLean-Scocuzza, Randine SAILOR, MD  LANTUS  SOLOSTAR 100 UNIT/ML Solostar Pen Inject 30 Units into the skin daily. 10/04/22   Hope Merle, MD  leflunomide  (ARAVA ) 20 MG tablet Take 1 tablet (20 mg total) by mouth daily. 03/01/23     losartan  (COZAAR ) 100 MG tablet Take 1 tablet (100 mg total) by mouth daily. 04/03/23   Walsh, Tanya, MD  magnesium oxide (MAG-OX) 400 MG tablet Take 400 mg by mouth daily. 01/17/12   [provider]  nebivolol  (BYSTOLIC ) 5 MG tablet Take 1 tablet (5 mg total) by mouth daily. 04/03/23   Walsh,  Tanya, MD  nystatin cream (MYCOSTATIN) Apply 1 Application topically 2 (two) times daily as needed for dry skin.    [provider]  omeprazole (PRILOSEC) 40 MG capsule Take 40 mg by mouth daily.    [provider]  potassium chloride  SA (KLOR-CON  M) 20 MEQ tablet Take 1 tablet (20 mEq total) by mouth every other day. 02/22/22   McLean-Scocuzza, Randine SAILOR, MD  pravastatin  (PRAVACHOL ) 40 MG tablet Take 40 mg by mouth daily. 12/13/21   [provider]  sitaGLIPtin  (JANUVIA ) 100 MG tablet Take 100 mg by mouth daily.    [provider]  thiamine  (VITAMIN B1) 100 MG tablet Take 1 tablet (100 mg total) by mouth daily. 09/17/22   Hope Merle, MD  valACYclovir  (VALTREX ) 1000 MG tablet Take 1 tablet (1,000 mg total) by mouth 3 (three) times daily. 04/06/23   Hope Merle, MD  rosuvastatin  (CRESTOR ) 20 MG tablet Take 1 tablet (20 mg total) by mouth daily. 11/01/22 11/01/22  Dann Candyce RAMAN, MD    Allergies: Lisinopril, Sulfamethoxazole-trimethoprim, Dapagliflozin, Gabapentin, Glipizide, Metformin , Metformin  and related, Penicillin g, Penicillins, and Tramadol    Review of Systems  All other systems reviewed and are negative.   Updated Vital Signs BP (!) 168/80   Pulse 63   Temp 97.7 F (36.5 C) (Oral)   Resp 16   Ht 5' 1 (1.549 m)   Wt 63 kg   SpO2 99%   BMI 26.24 kg/m   Physical Exam Vitals and nursing note reviewed.  Constitutional:      General: She is not in acute distress.    Appearance: She is well-developed.     Comments: GCS 14, ABC intact  HENT:     Head: Normocephalic and atraumatic.  Eyes:     Extraocular Movements: Extraocular movements intact.     Conjunctiva/sclera: Conjunctivae normal.     Pupils: Pupils are equal, round, and reactive to light.  Neck:     Comments: No midline tenderness to palpation of the cervical spine.  Range of motion intact Cardiovascular:     Rate and Rhythm: Normal rate and regular rhythm.     Heart sounds:  No murmur heard. Pulmonary:     Effort: Pulmonary effort is normal. No respiratory distress.     Breath sounds: Normal breath sounds.  Chest:     Comments: Clavicles stable nontender to AP compression.  Chest wall stable and nontender to AP and lateral compression. Abdominal:     Palpations: Abdomen is soft.     Tenderness: There is no abdominal tenderness.     Comments: Pelvis stable to lateral compression  Musculoskeletal:     Cervical back: Neck supple.     Comments: No midline tenderness to palpation of the thoracic  or lumbar spine.  Extremities atraumatic with intact range of motion  Skin:    General: Skin is warm and dry.  Neurological:     Mental Status: She is alert.     Comments: Cranial nerves II through XII grossly intact.  Moving all 4 extremities spontaneously.  Sensation grossly intact all 4 extremities     (all labs ordered are listed, but only abnormal results are displayed) Labs Reviewed  CBC WITH DIFFERENTIAL/PLATELET - Abnormal; Notable for the following components:      Result Value   HCT 47.0 (*)    MCV 100.9 (*)    All other components within normal limits  URINALYSIS, ROUTINE W REFLEX MICROSCOPIC - Abnormal; Notable for the following components:   Glucose, UA >=500 (*)    All other components within normal limits  COMPREHENSIVE METABOLIC PANEL WITH GFR - Abnormal; Notable for the following components:   BUN 25 (*)    Creatinine, Ser 1.29 (*)    GFR, Estimated 42 (*)    All other components within normal limits  CBG MONITORING, ED - Abnormal; Notable for the following components:   Glucose-Capillary 62 (*)    All other components within normal limits  I-STAT VENOUS BLOOD GAS, ED - Abnormal; Notable for the following components:   pO2, Ven 150 (*)    Bicarbonate 29.7 (*)    Potassium 6.0 (*)    Hemoglobin 15.6 (*)    All other components within normal limits  ETHANOL  AMMONIA  CBG MONITORING, ED    EKG: None  Radiology: DG Pelvis  Portable Result Date: 07/09/2024 EXAM: 1 or 2 VIEW(S) XRAY OF THE PELVIS 07/09/2024 12:20:00 PM COMPARISON: CT abdomen and pelvis 07/15/2023. CLINICAL HISTORY: 79 year old female with history of falls. 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 Heterotopic calcification about right hip. Moderate degenerative changes of left hip. No acute fracture. No malalignment. SOFT TISSUES: Left femoral artery vascular stent. Vascular calcifications. IMPRESSION: 1. No acute fracture or dislocation identified about the pelvis. Electronically signed by: Helayne Hurst MD 07/09/2024 01:24 PM EST RP Workstation: [id number]   DG Chest Portable 1 View Result Date: 07/09/2024 EXAM: 1 VIEW XRAY OF THE CHEST 07/09/2024 12:20:00 PM COMPARISON: CT chest, abdomen, and pelvis 07/15/2023. CLINICAL HISTORY: 79 year old female with a history of multiple falls. FINDINGS: LUNGS AND PLEURA: No focal pulmonary opacity. No pleural effusion. No pneumothorax. HEART AND MEDIASTINUM: No acute abnormality of the cardiac and mediastinal silhouettes. BONES AND SOFT TISSUES: Cervical ACDF hardware now in place. No acute osseous abnormality. IMPRESSION: 1. No acute cardiopulmonary abnormality. Electronically signed by: Helayne Hurst MD 07/09/2024 01:23 PM EST RP Workstation: [id number]   CT HEAD WO CONTRAST Result Date: 07/09/2024 EXAM: CT HEAD WITHOUT CONTRAST 07/09/2024 01:04:00 PM TECHNIQUE: CT of the head was performed without the administration of intravenous contrast. Automated exposure control, iterative reconstruction, and/or weight based adjustment of the mA/kV was utilized to reduce the radiation dose to as low as reasonably achievable. COMPARISON: Brain MRI 10/13/2021 and CT head 07/15/2023. CLINICAL HISTORY: 79 year old female. Mental status change, unknown cause. Unwitnessed fall at 0700 hours today. FINDINGS: BRAIN AND VENTRICLES: No acute hemorrhage. No evidence of acute infarct. No hydrocephalus. No extra-axial  collection. No mass effect or midline shift. Incidental chronic dural calcification. Stable brain volume. Patchy and confluent chronic white matter disease with hypodensity. Stable chronic heterogeneity in the bilateral deep gray matter nuclei. Stable non-contrast CT appearance of advanced small vessel disease. No suspicious intracranial vascular hyperdensity. No acute traumatic  injury identified. ORBITS: No acute abnormality. SINUSES: Paranasal sinuses, tip and end cavities and mastoids are well aerated. SOFT TISSUES AND SKULL: No acute soft tissue abnormality. No skull fracture. IMPRESSION: 1. No acute intracranial abnormality or acute traumatic injury identified. 2. Stable non-contrast CT appearance of advanced small vessel disease. Electronically signed by: Helayne Hurst MD 07/09/2024 01:21 PM EST RP Workstation: [id number]     Procedures   Medications Ordered in the ED - No data to display                                  Medical Decision Making Amount and/or Complexity of Data Reviewed Labs: ordered. Radiology: ordered.   79 year old female with medical history significant for degenerative disc disease, diabetes, HTN, HLD, PAD CAD, CKD presenting after an unwitnessed fall at her facility, as a level 2 trauma due to Plavix  use.  Patient states that she tripped and landed on her buttocks.  She denies clear head trauma or loss of consciousness.  Endorses pain in her buttocks along her left hip but is able to range the hip.  Had had some confusion reported earlier as the [facility] and the MD had said she had forgotten having her convert during the evaluation and forgot the MD had been in to see her.  No loss of consciousness.  Patient arrives GCS 14, ABC intact.  On arrival, the patient was afebrile, not tachycardic or tachypneic, BP 122/80, saturating her percent on room air.  Trauma workup initiated on patient arrival due to report of unwitnessed fall over this  morning patient declines head trauma however given unwitnessed fall and complaint of confusion will obtain CT imaging of the head.  Patient with no significant C-spine tenderness, endorses mild tenderness about the left hip.  CXR: No acute cardiopulmonary abnormality  X-ray of the pelvis: No acute fracture or dislocation about the pelvis  CT head: No acute intracranial abnormality  Labs: VBG without a significant acidosis or alkalosis, ethanol level normal, ammonia normal, urinalysis negative for UTI, CMP obtained at 1457 with a blood glucose of 88, creatinine 1.29, CBC without a leukocytosis or anemia.  Subsequent CBG was mildly hypoglycemic to 62.  The patient was administered juice and crackers.  At time of signout to follow-up results of patient laboratory evaluation, reassess the patient, ultimate disposition pending results of reassessment.  Patient confusion could be due to mild hypoglycemia although initial CBG with her CMP was normal.  Given to Dr. Purvis at [phone].     Final diagnoses:  Fall, initial encounter  Confusion  Hypoglycemia    ED Discharge Orders     None          Jerrol Agent, MD 07/09/24 1700  "

## 2024-07-09 NOTE — ED Provider Notes (Signed)
" °  Physical Exam  BP (!) 147/91 (BP Location: Left Arm)   Pulse 70   Temp 98.4 F (36.9 C) (Oral)   Resp 16   Ht 5' 1 (1.549 m)   Wt 63 kg   SpO2 100%   BMI 26.24 kg/m   Physical Exam Vitals and nursing note reviewed.  HENT:     Head: Normocephalic and atraumatic.  Eyes:     Pupils: Pupils are equal, round, and reactive to light.  Cardiovascular:     Rate and Rhythm: Normal rate and regular rhythm.  Pulmonary:     Effort: Pulmonary effort is normal.     Breath sounds: Normal breath sounds.  Abdominal:     Palpations: Abdomen is soft.     Tenderness: There is no abdominal tenderness.  Skin:    General: Skin is warm and dry.  Neurological:     Mental Status: She is alert.  Psychiatric:        Mood and Affect: Mood normal.     Procedures  Procedures  ED Course / MDM   Clinical Course as of 07/09/24 1840  Tue Jul 09, 2024  1839 Laboratory workup unremarkable.  Hypoglycemia has improved after meal.  Patient feeling a bit better.  Steady on her feet ambulating with walker at baseline.  Mentation at baseline.  Discussed with patient and her daughters at bedside.  Will discharge with plan for close PCP follow-up.  She does have a rolling walker and regular walker at home and I encouraged her to use these.  Return precautions are be worrisome for severe weakness or repeated falls were discussed in detail. [MP]    Clinical Course User Index [MP] Pamella Ozell LABOR, DO   Medical Decision Making I, Ozell Pamella DO, have assumed care of this patient from the previous provider pending remainder of laboratory workup, ambulatory trial, reevaluation and disposition  Amount and/or Complexity of Data Reviewed Labs: ordered. Radiology: ordered.          Pamella Ozell LABOR, DO 07/09/24 1840  "

## 2024-07-09 NOTE — ED Triage Notes (Signed)
 Pt BIB [facility]. Fell at 7 am unwitnessed. Daugther took her to [facility] to be evaluated. MD said she had forgotten having convo during eval and forgot the MD had been in. C/O pain in butt. Fell onto butt and denies hitting head. No LOC. Pt on Plavix . Hx of UTI, denies any UTI symptoms. 12 lead unremarkable.

## 2024-07-11 ENCOUNTER — Telehealth: Payer: Self-pay

## 2024-07-11 NOTE — Telephone Encounter (Signed)
 LVM for pt to r/s appt with dr mariah that is currently scheduled on 2/13 when he is out of the office

## 2024-07-12 ENCOUNTER — Telehealth: Payer: Self-pay

## 2024-07-12 NOTE — Telephone Encounter (Signed)
 I talked to the pt and she stated she didn't make the appt,. [place] it was made by [facility]. I am unsure what to do since the pt refused to r/s herself. She said I need to contact [facility]. Cassandra can you help me r/s her?

## 2024-07-12 NOTE — Telephone Encounter (Signed)
 LVM for [facility] to cb to r/s pt

## 2024-07-22 ENCOUNTER — Ambulatory Visit: Payer: Medicare (Managed Care)

## 2024-08-02 ENCOUNTER — Ambulatory Visit: Payer: Medicare (Managed Care)

## 2024-08-13 ENCOUNTER — Ambulatory Visit: Payer: Medicare (Managed Care)

## 2024-09-16 ENCOUNTER — Ambulatory Visit: Payer: Medicare (Managed Care)

## 2024-09-16 ENCOUNTER — Ambulatory Visit (HOSPITAL_COMMUNITY): Payer: Medicare (Managed Care)

## 2024-09-19 ENCOUNTER — Ambulatory Visit: Payer: Medicare (Managed Care)
